# Patient Record
Sex: Female | Born: 1945 | Race: White | Hispanic: No | Smoking: Former smoker
Health system: Southern US, Community
[De-identification: ages and names within clinical notes are randomized; demographics above are authoritative.]

## PROBLEM LIST (undated history)

## (undated) ENCOUNTER — Ambulatory Visit: Admission: EM | Payer: Medicare PPO | Source: Home / Self Care

## (undated) DIAGNOSIS — I739 Peripheral vascular disease, unspecified: Secondary | ICD-10-CM

## (undated) DIAGNOSIS — I70219 Atherosclerosis of native arteries of extremities with intermittent claudication, unspecified extremity: Secondary | ICD-10-CM

## (undated) DIAGNOSIS — G4700 Insomnia, unspecified: Secondary | ICD-10-CM

## (undated) DIAGNOSIS — C4492 Squamous cell carcinoma of skin, unspecified: Secondary | ICD-10-CM

## (undated) DIAGNOSIS — L219 Seborrheic dermatitis, unspecified: Secondary | ICD-10-CM

## (undated) DIAGNOSIS — H3530 Unspecified macular degeneration: Secondary | ICD-10-CM

## (undated) DIAGNOSIS — E785 Hyperlipidemia, unspecified: Secondary | ICD-10-CM

## (undated) DIAGNOSIS — D649 Anemia, unspecified: Secondary | ICD-10-CM

## (undated) DIAGNOSIS — M169 Osteoarthritis of hip, unspecified: Secondary | ICD-10-CM

## (undated) DIAGNOSIS — G459 Transient cerebral ischemic attack, unspecified: Secondary | ICD-10-CM

## (undated) DIAGNOSIS — M5136 Other intervertebral disc degeneration, lumbar region: Secondary | ICD-10-CM

## (undated) DIAGNOSIS — I209 Angina pectoris, unspecified: Secondary | ICD-10-CM

## (undated) DIAGNOSIS — N186 End stage renal disease: Secondary | ICD-10-CM

## (undated) DIAGNOSIS — E119 Type 2 diabetes mellitus without complications: Secondary | ICD-10-CM

## (undated) DIAGNOSIS — H905 Unspecified sensorineural hearing loss: Secondary | ICD-10-CM

## (undated) DIAGNOSIS — E1122 Type 2 diabetes mellitus with diabetic chronic kidney disease: Secondary | ICD-10-CM

## (undated) DIAGNOSIS — Z5309 Procedure and treatment not carried out because of other contraindication: Secondary | ICD-10-CM

## (undated) DIAGNOSIS — E039 Hypothyroidism, unspecified: Secondary | ICD-10-CM

## (undated) DIAGNOSIS — N189 Chronic kidney disease, unspecified: Secondary | ICD-10-CM

## (undated) DIAGNOSIS — Z7982 Long term (current) use of aspirin: Secondary | ICD-10-CM

## (undated) DIAGNOSIS — D631 Anemia in chronic kidney disease: Secondary | ICD-10-CM

## (undated) DIAGNOSIS — J449 Chronic obstructive pulmonary disease, unspecified: Secondary | ICD-10-CM

## (undated) DIAGNOSIS — J849 Interstitial pulmonary disease, unspecified: Secondary | ICD-10-CM

## (undated) DIAGNOSIS — C4490 Unspecified malignant neoplasm of skin, unspecified: Secondary | ICD-10-CM

## (undated) DIAGNOSIS — K219 Gastro-esophageal reflux disease without esophagitis: Secondary | ICD-10-CM

## (undated) DIAGNOSIS — F329 Major depressive disorder, single episode, unspecified: Secondary | ICD-10-CM

## (undated) DIAGNOSIS — M5030 Other cervical disc degeneration, unspecified cervical region: Secondary | ICD-10-CM

## (undated) DIAGNOSIS — M810 Age-related osteoporosis without current pathological fracture: Secondary | ICD-10-CM

## (undated) DIAGNOSIS — D126 Benign neoplasm of colon, unspecified: Secondary | ICD-10-CM

## (undated) DIAGNOSIS — Z7960 Long term (current) use of unspecified immunomodulators and immunosuppressants: Secondary | ICD-10-CM

## (undated) DIAGNOSIS — C801 Malignant (primary) neoplasm, unspecified: Secondary | ICD-10-CM

## (undated) DIAGNOSIS — R011 Cardiac murmur, unspecified: Secondary | ICD-10-CM

## (undated) DIAGNOSIS — R001 Bradycardia, unspecified: Secondary | ICD-10-CM

## (undated) DIAGNOSIS — Z79899 Other long term (current) drug therapy: Secondary | ICD-10-CM

## (undated) DIAGNOSIS — Z992 Dependence on renal dialysis: Secondary | ICD-10-CM

## (undated) DIAGNOSIS — I6523 Occlusion and stenosis of bilateral carotid arteries: Secondary | ICD-10-CM

## (undated) DIAGNOSIS — M1990 Unspecified osteoarthritis, unspecified site: Secondary | ICD-10-CM

## (undated) DIAGNOSIS — I10 Essential (primary) hypertension: Secondary | ICD-10-CM

## (undated) DIAGNOSIS — E11319 Type 2 diabetes mellitus with unspecified diabetic retinopathy without macular edema: Secondary | ICD-10-CM

## (undated) DIAGNOSIS — I509 Heart failure, unspecified: Secondary | ICD-10-CM

## (undated) DIAGNOSIS — E559 Vitamin D deficiency, unspecified: Secondary | ICD-10-CM

## (undated) DIAGNOSIS — M51369 Other intervertebral disc degeneration, lumbar region without mention of lumbar back pain or lower extremity pain: Secondary | ICD-10-CM

## (undated) DIAGNOSIS — I2510 Atherosclerotic heart disease of native coronary artery without angina pectoris: Secondary | ICD-10-CM

## (undated) DIAGNOSIS — I700 Atherosclerosis of aorta: Secondary | ICD-10-CM

## (undated) HISTORY — PX: COLONOSCOPY: SHX174

## (undated) HISTORY — PX: TONSILLECTOMY: SUR1361

## (undated) HISTORY — DX: Type 2 diabetes mellitus without complications: E11.9

## (undated) HISTORY — PX: ABDOMINAL HYSTERECTOMY: SHX81

## (undated) HISTORY — PX: CAROTID ENDARTERECTOMY: SUR193

## (undated) HISTORY — PX: CARDIAC CATHETERIZATION: SHX172

## (undated) HISTORY — DX: Essential (primary) hypertension: I10

---
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
## 1969-12-18 HISTORY — PX: ABDOMINAL HYSTERECTOMY: SHX81

## 2000-12-18 DIAGNOSIS — Z951 Presence of aortocoronary bypass graft: Secondary | ICD-10-CM

## 2000-12-18 HISTORY — DX: Presence of aortocoronary bypass graft: Z95.1

## 2000-12-18 HISTORY — PX: CORONARY ARTERY BYPASS GRAFT: SHX141

## 2001-01-18 HISTORY — PX: CORONARY ARTERY BYPASS GRAFT: SHX141

## 2001-12-18 HISTORY — PX: CORONARY ARTERY BYPASS GRAFT: SHX141

## 2011-07-29 DIAGNOSIS — E785 Hyperlipidemia, unspecified: Secondary | ICD-10-CM | POA: Insufficient documentation

## 2011-07-29 DIAGNOSIS — E1169 Type 2 diabetes mellitus with other specified complication: Secondary | ICD-10-CM | POA: Insufficient documentation

## 2011-07-29 DIAGNOSIS — E119 Type 2 diabetes mellitus without complications: Secondary | ICD-10-CM | POA: Insufficient documentation

## 2011-07-29 DIAGNOSIS — R9439 Abnormal result of other cardiovascular function study: Secondary | ICD-10-CM | POA: Insufficient documentation

## 2011-07-29 DIAGNOSIS — E1159 Type 2 diabetes mellitus with other circulatory complications: Secondary | ICD-10-CM | POA: Insufficient documentation

## 2011-07-29 DIAGNOSIS — F439 Reaction to severe stress, unspecified: Secondary | ICD-10-CM | POA: Insufficient documentation

## 2011-07-29 DIAGNOSIS — I2510 Atherosclerotic heart disease of native coronary artery without angina pectoris: Secondary | ICD-10-CM | POA: Insufficient documentation

## 2011-07-29 DIAGNOSIS — E669 Obesity, unspecified: Secondary | ICD-10-CM | POA: Insufficient documentation

## 2011-07-29 DIAGNOSIS — E1122 Type 2 diabetes mellitus with diabetic chronic kidney disease: Secondary | ICD-10-CM | POA: Insufficient documentation

## 2011-07-29 DIAGNOSIS — N186 End stage renal disease: Secondary | ICD-10-CM | POA: Insufficient documentation

## 2012-06-24 DIAGNOSIS — D649 Anemia, unspecified: Secondary | ICD-10-CM | POA: Insufficient documentation

## 2012-09-11 DIAGNOSIS — I7782 Antineutrophilic cytoplasmic antibody (ANCA) vasculitis: Secondary | ICD-10-CM

## 2012-09-11 DIAGNOSIS — N186 End stage renal disease: Secondary | ICD-10-CM | POA: Insufficient documentation

## 2012-09-11 DIAGNOSIS — N184 Chronic kidney disease, stage 4 (severe): Secondary | ICD-10-CM | POA: Insufficient documentation

## 2012-09-11 DIAGNOSIS — N08 Glomerular disorders in diseases classified elsewhere: Secondary | ICD-10-CM

## 2012-09-11 HISTORY — DX: Glomerular disorders in diseases classified elsewhere: N08

## 2012-09-11 HISTORY — DX: Antineutrophilic cytoplasmic antibody (ANCA) vasculitis: I77.82

## 2012-11-01 DIAGNOSIS — D638 Anemia in other chronic diseases classified elsewhere: Secondary | ICD-10-CM | POA: Insufficient documentation

## 2012-12-18 HISTORY — PX: CATARACT EXTRACTION: SUR2

## 2013-03-09 DIAGNOSIS — R55 Syncope and collapse: Secondary | ICD-10-CM | POA: Insufficient documentation

## 2013-06-23 DIAGNOSIS — I6529 Occlusion and stenosis of unspecified carotid artery: Secondary | ICD-10-CM | POA: Insufficient documentation

## 2013-11-10 DIAGNOSIS — M25561 Pain in right knee: Secondary | ICD-10-CM | POA: Insufficient documentation

## 2013-11-10 DIAGNOSIS — M25522 Pain in left elbow: Secondary | ICD-10-CM | POA: Insufficient documentation

## 2013-11-10 DIAGNOSIS — W19XXXA Unspecified fall, initial encounter: Secondary | ICD-10-CM | POA: Insufficient documentation

## 2013-11-10 DIAGNOSIS — S8001XA Contusion of right knee, initial encounter: Secondary | ICD-10-CM | POA: Insufficient documentation

## 2013-11-10 DIAGNOSIS — S52122A Displaced fracture of head of left radius, initial encounter for closed fracture: Secondary | ICD-10-CM | POA: Insufficient documentation

## 2013-11-10 DIAGNOSIS — S0083XA Contusion of other part of head, initial encounter: Secondary | ICD-10-CM | POA: Insufficient documentation

## 2013-11-19 DIAGNOSIS — N19 Unspecified kidney failure: Secondary | ICD-10-CM | POA: Insufficient documentation

## 2014-01-05 DIAGNOSIS — K8020 Calculus of gallbladder without cholecystitis without obstruction: Secondary | ICD-10-CM | POA: Insufficient documentation

## 2014-07-24 DIAGNOSIS — R0609 Other forms of dyspnea: Secondary | ICD-10-CM | POA: Insufficient documentation

## 2014-07-24 DIAGNOSIS — R0600 Dyspnea, unspecified: Secondary | ICD-10-CM | POA: Insufficient documentation

## 2014-07-24 DIAGNOSIS — R42 Dizziness and giddiness: Secondary | ICD-10-CM | POA: Insufficient documentation

## 2014-08-20 DIAGNOSIS — D631 Anemia in chronic kidney disease: Secondary | ICD-10-CM | POA: Insufficient documentation

## 2015-01-18 DIAGNOSIS — G459 Transient cerebral ischemic attack, unspecified: Secondary | ICD-10-CM | POA: Insufficient documentation

## 2015-02-23 DIAGNOSIS — I208 Other forms of angina pectoris: Secondary | ICD-10-CM | POA: Insufficient documentation

## 2015-06-29 DIAGNOSIS — M791 Myalgia, unspecified site: Secondary | ICD-10-CM | POA: Insufficient documentation

## 2015-06-29 DIAGNOSIS — I70219 Atherosclerosis of native arteries of extremities with intermittent claudication, unspecified extremity: Secondary | ICD-10-CM | POA: Insufficient documentation

## 2015-06-29 DIAGNOSIS — I739 Peripheral vascular disease, unspecified: Secondary | ICD-10-CM | POA: Insufficient documentation

## 2015-08-17 DIAGNOSIS — I739 Peripheral vascular disease, unspecified: Secondary | ICD-10-CM | POA: Insufficient documentation

## 2016-06-05 DIAGNOSIS — M8589 Other specified disorders of bone density and structure, multiple sites: Secondary | ICD-10-CM | POA: Insufficient documentation

## 2017-03-28 DIAGNOSIS — J449 Chronic obstructive pulmonary disease, unspecified: Secondary | ICD-10-CM | POA: Insufficient documentation

## 2017-10-10 DIAGNOSIS — D126 Benign neoplasm of colon, unspecified: Secondary | ICD-10-CM | POA: Insufficient documentation

## 2017-12-18 DIAGNOSIS — G459 Transient cerebral ischemic attack, unspecified: Secondary | ICD-10-CM

## 2017-12-18 HISTORY — DX: Transient cerebral ischemic attack, unspecified: G45.9

## 2018-07-22 DIAGNOSIS — I358 Other nonrheumatic aortic valve disorders: Secondary | ICD-10-CM | POA: Insufficient documentation

## 2018-12-18 HISTORY — PX: CHOLECYSTECTOMY: SHX55

## 2019-11-18 DIAGNOSIS — R5383 Other fatigue: Secondary | ICD-10-CM | POA: Insufficient documentation

## 2019-11-18 DIAGNOSIS — I10 Essential (primary) hypertension: Secondary | ICD-10-CM | POA: Insufficient documentation

## 2019-11-18 DIAGNOSIS — N1832 Chronic kidney disease, stage 3b: Secondary | ICD-10-CM | POA: Insufficient documentation

## 2020-07-22 HISTORY — PX: EYE SURGERY: SHX253

## 2020-09-07 ENCOUNTER — Other Ambulatory Visit: Payer: Self-pay

## 2020-09-07 ENCOUNTER — Encounter (INDEPENDENT_AMBULATORY_CARE_PROVIDER_SITE_OTHER): Payer: Self-pay | Admitting: Ophthalmology

## 2020-09-07 ENCOUNTER — Ambulatory Visit (INDEPENDENT_AMBULATORY_CARE_PROVIDER_SITE_OTHER): Payer: Medicare PPO | Admitting: Ophthalmology

## 2020-09-07 DIAGNOSIS — E113412 Type 2 diabetes mellitus with severe nonproliferative diabetic retinopathy with macular edema, left eye: Secondary | ICD-10-CM | POA: Insufficient documentation

## 2020-09-07 DIAGNOSIS — Z9889 Other specified postprocedural states: Secondary | ICD-10-CM | POA: Insufficient documentation

## 2020-09-07 DIAGNOSIS — E113293 Type 2 diabetes mellitus with mild nonproliferative diabetic retinopathy without macular edema, bilateral: Secondary | ICD-10-CM

## 2020-09-07 DIAGNOSIS — E113393 Type 2 diabetes mellitus with moderate nonproliferative diabetic retinopathy without macular edema, bilateral: Secondary | ICD-10-CM | POA: Insufficient documentation

## 2020-09-07 DIAGNOSIS — H35352 Cystoid macular degeneration, left eye: Secondary | ICD-10-CM

## 2020-09-07 DIAGNOSIS — E113391 Type 2 diabetes mellitus with moderate nonproliferative diabetic retinopathy without macular edema, right eye: Secondary | ICD-10-CM | POA: Insufficient documentation

## 2020-09-07 DIAGNOSIS — E113213 Type 2 diabetes mellitus with mild nonproliferative diabetic retinopathy with macular edema, bilateral: Secondary | ICD-10-CM | POA: Insufficient documentation

## 2020-09-07 MED ORDER — BROMFENAC SODIUM 0.07 % OP SOLN: 1.0000 [drp] | Freq: Every morning | OPHTHALMIC | 3 refills | Status: DC

## 2020-09-07 NOTE — Assessment & Plan Note (Signed)
Is minor and may recruit represent an improved state post vitrectomy membrane peel at this may be clearing on its own.  Because of the location and the type of nonexudative edema, I will offer the patient a sample bottle PROLENSA to use once daily and thereafter 1 bottle of generic PROLENSA once daily left eye perhaps assist healing.  Condition could in fact only be early CSME of mild nonproliferative diabetic retinopathy.  Nonetheless I think the former is most likely we will we will treat briefly very safe topical medications

## 2020-09-07 NOTE — Assessment & Plan Note (Signed)
The nature of mild nonproliferative diabetic retinopathy was discussed with the patient. Emphasis was placed on tight glucose, blood pressure, and serum lipid control. Avoidance of smoking was emphasized. Maintenance of normal body weight was emphasized. Appropriate follow up dilated exam is 1 year. 

## 2020-09-07 NOTE — Progress Notes (Signed)
09/07/2020     CHIEF COMPLAINT Patient presents for Retina Follow Up   HISTORY OF PRESENT ILLNESS: Alexandria Moody is a 74 y.o. female who presents to the clinic today for:   HPI    Retina Follow Up    Diagnosis: ERM/ Post OP.  In left eye.  Severity is moderate.  Duration of 1.5 months.  Since onset it is stable.  I, the attending physician,  performed the HPI with the patient and updated documentation appropriately.          Comments    Pt referred by Dr. Towanda Octave for post op eval - Vitrectomy and Membrane Peel OS,   Pt states OS vision is still very blurry. Pt recently moved to [place], [place].  BGL: did not check A1C: 6       Last edited by Hurman Horn, MD on 09/07/2020  2:14 PM. (History)      Referring physician: No referring provider defined for this encounter.  HISTORICAL INFORMATION:   Selected notes from the MEDICAL RECORD NUMBER       CURRENT MEDICATIONS: No current outpatient medications on file. (Ophthalmic Drugs)   No current facility-administered medications for this visit. (Ophthalmic Drugs)   Current Outpatient Medications (Other)  Medication Sig  . albuterol (VENTOLIN HFA) 108 (90 Base) MCG/ACT inhaler Inhale into the lungs.  Marland Kitchen amLODipine (NORVASC) 5 MG tablet Take 1 tablet by mouth 2 (two) times daily.  . Aspirin Buf,CaCarb-MgCarb-MgO, 81 MG TABS Take 1 tablet by mouth daily.  . calcitRIOL (ROCALTROL) 0.25 MCG capsule Take by mouth.  . citalopram (CELEXA) 20 MG tablet Take by mouth.  . furosemide (LASIX) 20 MG tablet Take by mouth.  . hydrALAZINE (APRESOLINE) 50 MG tablet Take by mouth.  . insulin glargine, 1 Unit Dial, (TOUJEO SOLOSTAR) 300 UNIT/ML Solostar Pen   . isosorbide mononitrate (IMDUR) 120 MG 24 hr tablet Take by mouth.  . levothyroxine (SYNTHROID) 75 MCG tablet Take by mouth.  . linagliptin (TRADJENTA) 5 MG TABS tablet 1 (one) time each day  . omeprazole (PRILOSEC) 20 MG capsule Take 1 capsule by mouth daily.  . ranolazine (RANEXA) 500  MG 12 hr tablet Take by mouth.  . rosuvastatin (CRESTOR) 10 MG tablet Take by mouth.  Marland Kitchen acetaminophen (TYLENOL) 325 MG tablet Take by mouth.  Marland Kitchen aspirin 81 MG EC tablet Take by mouth.  . Omega-3 Fatty Acids (FISH OIL) 1000 MG CAPS Take by mouth.  . temazepam (RESTORIL) 15 MG capsule Take by mouth.  . Tiotropium Bromide-Olodaterol 2.5-2.5 MCG/ACT AERS Inhale into the lungs.   No current facility-administered medications for this visit. (Other)      REVIEW OF SYSTEMS: ROS    Positive for: Endocrine   Last edited by Tilda Franco on 09/07/2020  1:42 PM. (History)       ALLERGIES No Known Allergies  PAST MEDICAL HISTORY Past Medical History:  Diagnosis Date  . Diabetes mellitus without complication ([place])   . Hypertension    Past Surgical History:  Procedure Laterality Date  . CATARACT EXTRACTION Left 2014  . EYE SURGERY Left 07/22/2020   Dr. Towanda Octave, Vit and Mart[facility] Lamas    FAMILY HISTORY History reviewed. No pertinent family history.  SOCIAL HISTORY Social History   Tobacco Use  . Smoking status: Never Smoker  . Smokeless tobacco: Never Used  Substance Use Topics  . Alcohol use: Not on file  . Drug use: Not on file         OPHTHALMIC  EXAM: Base Eye Exam    Visual Acuity (Snellen - Linear)      Right Left   Dist cc 20/20 -2 20/25   Correction: Glasses       Tonometry (Tonopen, 1:50 PM)      Right Left   Pressure 19 18       Pupils      Pupils Dark Light Shape React APD   Right PERRL 4 3 Round Slow None   Left PERRL 4 3 Round Slow None       Visual Fields (Counting fingers)      Left Right    Full Full       Neuro/Psych    Oriented x3: Yes   Mood/Affect: Normal       Dilation    Both eyes: 1.0% Mydriacyl, 2.5% Phenylephrine @ 1:50 PM        Slit Lamp and Fundus Exam    External Exam      Right Left   External Normal Normal       Slit Lamp Exam      Right Left   Lids/Lashes Normal Normal   Conjunctiva/Sclera White and quiet  White and quiet   Cornea Clear Clear   Anterior Chamber Deep and quiet Deep and quiet   Iris Round and reactive Round and reactive   Anterior Vitreous Normal Normal       Fundus Exam      Right Left   Posterior Vitreous Posterior vitreous detachment Clear vitrectomized   Disc Normal Normal   C/D Ratio 0.25 0.3   Macula Normal Microaneurysms, no macular thickening, no exudates, no clinically significant macular edema   Vessels NPDR- Mild    Periphery Normal Normal          IMAGING AND PROCEDURES  Imaging and Procedures for 09/07/20  OCT, Retina - OU - Both Eyes       Right Eye Quality was good. Scan locations included subfoveal. Central Foveal Thickness: 241. Progression has been stable. Findings include epiretinal membrane.   Left Eye Quality was good. Scan locations included subfoveal. Central Foveal Thickness: 300. Progression has improved. Findings include cystoid macular edema.   Notes Minor epiretinal membrane nasal to the fovea right eye, no topographic distortion to the macular region.  OS with small minor perifoveal CME.  Clinical findings of a cluster of microaneurysms which could be mild to moderate nonproliferative diabetic retinopathy no overt CSME no exudates                ASSESSMENT/PLAN:  Cystoid macular edema of left eye Is minor and may recruit represent an improved state post vitrectomy membrane peel at this may be clearing on its own.  Because of the location and the type of nonexudative edema, I will offer the patient a sample bottle PROLENSA to use once daily and thereafter 1 bottle of generic PROLENSA once daily left eye perhaps assist healing.  Condition could in fact only be early CSME of mild nonproliferative diabetic retinopathy.  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 Emphasis was placed on tight glucose, blood pressure, and serum lipid control. Avoidance of smoking was emphasized. Maintenance of normal body weight was emphasized. Appropriate follow up dilated exam is 1 year.  ICD-10-CM   1. Mild nonproliferative diabetic retinopathy of both eyes without macular edema associated with type 2 diabetes mellitus (HCC)  L94.4461 OCT, Retina - OU - Both Eyes  2. Cystoid macular edema of left eye  H35.352 OCT, Retina - OU - Both Eyes  3. History of vitrectomy  Z98.890     1.  Patient to commence topical use of bromfenac left eye once daily double bottle 2.5 cc given  2.  Generic prescription given to be used once this sample bottle is completed  3.  Follow-up in 5 weeks to monitor resolution of CME OS  Ophthalmic Meds Ordered this visit:  No orders of the defined types were placed in this encounter.      Return in about 5 weeks (around 10/12/2020) for dilate, OS, OCT.  There are no Patient Instructions on file for this visit.   Explained the diagnoses, plan, and follow up with the patient and they expressed understanding.  Patient expressed understanding of the importance of proper follow up care.   Clent Demark Earnestene Angello M.D. Diseases & Surgery of the Retina and Vitreous Retina & Diabetic [place] 09/07/20     Abbreviations: M myopia (nearsighted); A astigmatism; H hyperopia (farsighted); P presbyopia; Mrx spectacle prescription;  CTL contact lenses; OD right eye; OS left eye; OU both eyes  XT exotropia; ET esotropia; PEK punctate epithelial keratitis; PEE punctate epithelial erosions; DES dry eye syndrome; MGD meibomian gland dysfunction; ATs artificial tears; PFAT's preservative free artificial tears; [place] nuclear sclerotic cataract; PSC posterior subcapsular cataract; ERM epi-retinal membrane; PVD posterior vitreous detachment; RD  retinal detachment; DM diabetes mellitus; DR diabetic retinopathy; NPDR non-proliferative diabetic retinopathy; PDR proliferative diabetic retinopathy; CSME clinically significant macular edema; DME diabetic macular edema; dbh dot blot hemorrhages; CWS cotton wool spot; POAG primary open angle glaucoma; C/D cup-to-disc ratio; HVF humphrey visual field; GVF goldmann visual field; OCT optical coherence tomography; IOP intraocular pressure; BRVO Branch retinal vein occlusion; CRVO central retinal vein occlusion; CRAO central retinal artery occlusion; BRAO branch retinal artery occlusion; RT retinal tear; SB scleral buckle; PPV pars plana vitrectomy; VH Vitreous hemorrhage; PRP panretinal laser photocoagulation; IVK intravitreal kenalog; VMT vitreomacular traction; MH Macular hole;  NVD neovascularization of the disc; NVE neovascularization elsewhere; AREDS age related eye disease study; ARMD age related macular degeneration; POAG primary open angle glaucoma; EBMD epithelial/anterior basement membrane dystrophy; ACIOL anterior chamber intraocular lens; IOL intraocular lens; PCIOL posterior chamber intraocular lens; Phaco/IOL phacoemulsification with intraocular lens placement; [place] photorefractive keratectomy; LASIK laser assisted in situ keratomileusis; HTN hypertension; DM diabetes mellitus; COPD chronic obstructive pulmonary disease

## 2020-09-10 DIAGNOSIS — I700 Atherosclerosis of aorta: Secondary | ICD-10-CM | POA: Insufficient documentation

## 2020-09-10 DIAGNOSIS — J479 Bronchiectasis, uncomplicated: Secondary | ICD-10-CM | POA: Insufficient documentation

## 2020-09-10 DIAGNOSIS — Z79899 Other long term (current) drug therapy: Secondary | ICD-10-CM | POA: Insufficient documentation

## 2020-10-12 ENCOUNTER — Ambulatory Visit (INDEPENDENT_AMBULATORY_CARE_PROVIDER_SITE_OTHER): Payer: Medicare PPO | Admitting: Ophthalmology

## 2020-10-12 ENCOUNTER — Other Ambulatory Visit: Payer: Self-pay

## 2020-10-12 ENCOUNTER — Encounter (INDEPENDENT_AMBULATORY_CARE_PROVIDER_SITE_OTHER): Payer: Self-pay | Admitting: Ophthalmology

## 2020-10-12 DIAGNOSIS — E113293 Type 2 diabetes mellitus with mild nonproliferative diabetic retinopathy without macular edema, bilateral: Secondary | ICD-10-CM | POA: Diagnosis not present

## 2020-10-12 DIAGNOSIS — H35371 Puckering of macula, right eye: Secondary | ICD-10-CM | POA: Insufficient documentation

## 2020-10-12 DIAGNOSIS — H35352 Cystoid macular degeneration, left eye: Secondary | ICD-10-CM

## 2020-10-12 NOTE — Assessment & Plan Note (Signed)
Improved on topical NSAIDs mostly ketorolac 4 times daily, will taper to twice daily and then have her discontinue this in 2 months.  If however the visual acuity in this left eye blurs again, she may need to restart the medication.  Should she need to restart the medication she should contact the office so that refills would be approved

## 2020-10-12 NOTE — Assessment & Plan Note (Signed)
No progression of peripheral retinopathy

## 2020-10-12 NOTE — Progress Notes (Signed)
10/12/2020     CHIEF COMPLAINT Patient presents for Retina Follow Up   HISTORY OF PRESENT ILLNESS: Alexandria Moody is a 74 y.o. female who presents to the clinic today for:   HPI    Retina Follow Up    Diagnosis: CME.  In left eye.  Severity is moderate.  Duration of 5 weeks.  Since onset it is stable.  I, the attending physician,  performed the HPI with the patient and updated documentation appropriately.          Comments    5 Week CME f\u OS. OCT  Pt states OS vision is still not doing well. Pt states vision is the same as last visit. Using gtts as directed. BGL did not check       Last edited by Tilda Franco on 10/12/2020  1:49 PM. (History)      Referring physician: No referring provider defined for this encounter.  HISTORICAL INFORMATION:   Selected notes from the MEDICAL RECORD NUMBER       CURRENT MEDICATIONS: Current Outpatient Medications (Ophthalmic Drugs)  Medication Sig  . Bromfenac Sodium 0.07 % SOLN Place 1 drop into the left eye every morning.   No current facility-administered medications for this visit. (Ophthalmic Drugs)   Current Outpatient Medications (Other)  Medication Sig  . acetaminophen (TYLENOL) 325 MG tablet Take by mouth.  Marland Kitchen albuterol (VENTOLIN HFA) 108 (90 Base) MCG/ACT inhaler Inhale into the lungs.  Marland Kitchen amLODipine (NORVASC) 5 MG tablet Take 1 tablet by mouth 2 (two) times daily.  Marland Kitchen aspirin 81 MG EC tablet Take by mouth.  . Aspirin Buf,CaCarb-MgCarb-MgO, 81 MG TABS Take 1 tablet by mouth daily.  . calcitRIOL (ROCALTROL) 0.25 MCG capsule Take by mouth.  . citalopram (CELEXA) 20 MG tablet Take by mouth.  . furosemide (LASIX) 20 MG tablet Take by mouth.  . hydrALAZINE (APRESOLINE) 50 MG tablet Take by mouth.  . insulin glargine, 1 Unit Dial, (TOUJEO SOLOSTAR) 300 UNIT/ML Solostar Pen   . isosorbide mononitrate (IMDUR) 120 MG 24 hr tablet Take by mouth.  . levothyroxine (SYNTHROID) 75 MCG tablet Take by mouth.  . linagliptin  (TRADJENTA) 5 MG TABS tablet 1 (one) time each day  . Omega-3 Fatty Acids (FISH OIL) 1000 MG CAPS Take by mouth.  Marland Kitchen omeprazole (PRILOSEC) 20 MG capsule Take 1 capsule by mouth daily.  . ranolazine (RANEXA) 500 MG 12 hr tablet Take by mouth.  . rosuvastatin (CRESTOR) 10 MG tablet Take by mouth.  . temazepam (RESTORIL) 15 MG capsule Take by mouth.  . Tiotropium Bromide-Olodaterol 2.5-2.5 MCG/ACT AERS Inhale into the lungs.   No current facility-administered medications for this visit. (Other)      REVIEW OF SYSTEMS: ROS    Positive for: Endocrine   Last edited by Tilda Franco on 10/12/2020  1:49 PM. (History)       ALLERGIES No Known Allergies  PAST MEDICAL HISTORY Past Medical History:  Diagnosis Date  . Diabetes mellitus without complication ([place])   . Hypertension    Past Surgical History:  Procedure Laterality Date  . CATARACT EXTRACTION Left 2014  . EYE SURGERY Left 07/22/2020   Dr. Towanda Octave, Vit and Marta Lamas    FAMILY HISTORY History reviewed. No pertinent family history.  SOCIAL HISTORY Social History   Tobacco Use  . Smoking status: Never Smoker  . Smokeless tobacco: Never Used  Substance Use Topics  . Alcohol use: Not on file  . Drug use: Not on file  OPHTHALMIC EXAM:  Base Eye Exam    Visual Acuity (Snellen - Linear)      Right Left   Dist cc 20/25 20/25   Correction: Glasses       Tonometry (Tonopen, 1:52 PM)      Right Left   Pressure 21 19       Pupils      Pupils Dark Light Shape React APD   Right PERRL 4 3 Round Slow None   Left PERRL 4 3 Round Slow None       Neuro/Psych    Oriented x3: Yes   Mood/Affect: Normal       Dilation    Left eye: 1.0% Mydriacyl, 2.5% Phenylephrine @ 1:52 PM        Slit Lamp and Fundus Exam    External Exam      Right Left   External Normal Normal       Slit Lamp Exam      Right Left   Lids/Lashes Normal Normal   Conjunctiva/Sclera White and quiet White and quiet   Cornea  Clear Clear   Anterior Chamber Deep and quiet Deep and quiet   Iris Round and reactive Round and reactive   Lens Centered posterior chamber intraocular lens Centered posterior chamber intraocular lens   Anterior Vitreous Normal Normal       Fundus Exam      Right Left   Posterior Vitreous  Clear vitrectomized   Disc  Normal   C/D Ratio  0.3   Macula  Microaneurysms, no macular thickening, no exudates, no clinically significant macular edema   Vessels  NPDR- Mild   Periphery  Normal          IMAGING AND PROCEDURES  Imaging and Procedures for 10/12/20  OCT, Retina - OU - Both Eyes       Right Eye Central Foveal Thickness: 239. Progression has been stable. Findings include epiretinal membrane, abnormal foveal contour.   Left Eye Quality was good. Scan locations included subfoveal. Central Foveal Thickness: 279. Progression has improved. Findings include abnormal foveal contour.   Notes OD with minor epiretinal membrane nasal to the fovea, no impact on visual functioning  OS with much less cystoid macular edema in the perifoveal location after commencement of use of topical nonsteroidals.  We will asked the patient to taper Ketorolac to twice daily                ASSESSMENT/PLAN:  Cystoid macular edema of left eye Improved on topical NSAIDs mostly ketorolac 4 times daily, will taper to twice daily and then have her discontinue this in 2 months.  If however the visual acuity in this left eye blurs again, she may need to restart the medication.  Should she need to restart the medication she should contact the office so that refills would be approved  Mild nonproliferative diabetic retinopathy of both eyes without macular edema associated with type 2 diabetes mellitus (HCC) No progression of peripheral retinopathy  Macular pucker, right eye Minor nonfoveal will observe      ICD-10-CM   1. Cystoid macular edema of left eye  H35.352 OCT, Retina - OU - Both Eyes  2.  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 Macular pucker, right eye  H35.371     1.  I will offer the patient to continue ketorolac topically left eye only twice daily for maintenance.  2. Return follow-up visit next to monitor diabetic retinopathy and its potential progression 3.  Ophthalmic Meds Ordered this visit:  No orders of the defined types were placed in this encounter.      Return in about 6 months (around 04/12/2021) for DILATE OU, OCT.  There are no Patient Instructions on file for this visit.   Explained the diagnoses, plan, and follow up with the patient and they expressed understanding.  Patient expressed understanding of the importance of proper follow up care.   Clent Demark Tamari Busic M.D. Diseases & Surgery of the Retina and Vitreous Retina & Diabetic [place] 10/12/20     Abbreviations: M myopia (nearsighted); A astigmatism; H hyperopia (farsighted); P presbyopia; Mrx spectacle prescription;  CTL contact lenses; OD right eye; OS left eye; OU both eyes  XT exotropia; ET esotropia; PEK punctate epithelial keratitis; PEE punctate epithelial erosions; DES dry eye syndrome; MGD meibomian gland dysfunction; ATs artificial tears; PFAT's preservative free artificial tears; [place] nuclear sclerotic cataract; PSC posterior subcapsular cataract; ERM epi-retinal membrane; PVD posterior vitreous detachment; RD retinal detachment; DM diabetes mellitus; DR diabetic retinopathy; NPDR non-proliferative diabetic retinopathy; PDR proliferative diabetic retinopathy; CSME clinically significant macular edema; DME diabetic macular edema; dbh dot blot hemorrhages; CWS cotton wool spot; POAG primary open angle glaucoma; C/D cup-to-disc ratio; HVF humphrey visual field; GVF goldmann visual field; OCT optical coherence tomography; IOP intraocular pressure; BRVO Branch retinal vein occlusion; CRVO central  retinal vein occlusion; CRAO central retinal artery occlusion; BRAO branch retinal artery occlusion; RT retinal tear; SB scleral buckle; PPV pars plana vitrectomy; VH Vitreous hemorrhage; PRP panretinal laser photocoagulation; IVK intravitreal kenalog; VMT vitreomacular traction; MH Macular hole;  NVD neovascularization of the disc; NVE neovascularization elsewhere; AREDS age related eye disease study; ARMD age related macular degeneration; POAG primary open angle glaucoma; EBMD epithelial/anterior basement membrane dystrophy; ACIOL anterior chamber intraocular lens; IOL intraocular lens; PCIOL posterior chamber intraocular lens; Phaco/IOL phacoemulsification with intraocular lens placement; [place] photorefractive keratectomy; LASIK laser assisted in situ keratomileusis; HTN hypertension; DM diabetes mellitus; COPD chronic obstructive pulmonary disease

## 2020-10-12 NOTE — Assessment & Plan Note (Signed)
Minor nonfoveal will observe

## 2020-11-16 ENCOUNTER — Other Ambulatory Visit: Payer: Self-pay

## 2020-11-16 ENCOUNTER — Ambulatory Visit (INDEPENDENT_AMBULATORY_CARE_PROVIDER_SITE_OTHER): Payer: Medicare PPO | Admitting: Ophthalmology

## 2020-11-16 ENCOUNTER — Encounter (INDEPENDENT_AMBULATORY_CARE_PROVIDER_SITE_OTHER): Payer: Self-pay | Admitting: Ophthalmology

## 2020-11-16 DIAGNOSIS — E113293 Type 2 diabetes mellitus with mild nonproliferative diabetic retinopathy without macular edema, bilateral: Secondary | ICD-10-CM | POA: Diagnosis not present

## 2020-11-16 DIAGNOSIS — H35371 Puckering of macula, right eye: Secondary | ICD-10-CM | POA: Diagnosis not present

## 2020-11-16 DIAGNOSIS — H35351 Cystoid macular degeneration, right eye: Secondary | ICD-10-CM | POA: Insufficient documentation

## 2020-11-16 NOTE — Assessment & Plan Note (Signed)
New and secondary to epiretinal membrane as mentioned elsewhere

## 2020-11-16 NOTE — Progress Notes (Signed)
11/16/2020     CHIEF COMPLAINT Patient presents for Retina Follow Up   HISTORY OF PRESENT ILLNESS: Alexandria Moody is a 74 y.o. female who presents to the clinic today for:   HPI    Retina Follow Up    Patient presents with  Diabetic Retinopathy.  In right eye.  This started 3 weeks ago.  Severity is mild.  Duration of 3 weeks.  Since onset it is stable.          Comments    Diabetic F/U OU - new symptoms OD  Pt c/o new blur OD x 3 weeks. Pt denies floaters or flashes of light OU. Pt sts parts of words are "blacked out" OD x 3 weeks. VA OS stable. LBS: does not recall       Last edited by Rockie Neighbours, [place] on 11/16/2020  2:39 PM. (History)      Referring physician: No referring provider defined for this encounter.  HISTORICAL INFORMATION:   Selected notes from the MEDICAL RECORD NUMBER       CURRENT MEDICATIONS: Current Outpatient Medications (Ophthalmic Drugs)  Medication Sig  . Bromfenac Sodium 0.07 % SOLN Place 1 drop into the left eye every morning.   No current facility-administered medications for this visit. (Ophthalmic Drugs)   Current Outpatient Medications (Other)  Medication Sig  . acetaminophen (TYLENOL) 325 MG tablet Take by mouth.  Marland Kitchen albuterol (VENTOLIN HFA) 108 (90 Base) MCG/ACT inhaler Inhale into the lungs.  Marland Kitchen amLODipine (NORVASC) 5 MG tablet Take 1 tablet by mouth 2 (two) times daily.  Marland Kitchen aspirin 81 MG EC tablet Take by mouth.  . Aspirin Buf,CaCarb-MgCarb-MgO, 81 MG TABS Take 1 tablet by mouth daily.  . calcitRIOL (ROCALTROL) 0.25 MCG capsule Take by mouth.  . citalopram (CELEXA) 20 MG tablet Take by mouth.  . furosemide (LASIX) 20 MG tablet Take by mouth.  . hydrALAZINE (APRESOLINE) 50 MG tablet Take by mouth.  . insulin glargine, 1 Unit Dial, (TOUJEO SOLOSTAR) 300 UNIT/ML Solostar Pen   . isosorbide mononitrate (IMDUR) 120 MG 24 hr tablet Take by mouth.  . levothyroxine (SYNTHROID) 75 MCG tablet Take by mouth.  . linagliptin (TRADJENTA) 5  MG TABS tablet 1 (one) time each day  . Omega-3 Fatty Acids (FISH OIL) 1000 MG CAPS Take by mouth.  Marland Kitchen omeprazole (PRILOSEC) 20 MG capsule Take 1 capsule by mouth daily.  . ranolazine (RANEXA) 500 MG 12 hr tablet Take by mouth.  . rosuvastatin (CRESTOR) 10 MG tablet Take by mouth.  . temazepam (RESTORIL) 15 MG capsule Take by mouth.  . Tiotropium Bromide-Olodaterol 2.5-2.5 MCG/ACT AERS Inhale into the lungs.   No current facility-administered medications for this visit. (Other)      REVIEW OF SYSTEMS:    ALLERGIES No Known Allergies  PAST MEDICAL HISTORY Past Medical History:  Diagnosis Date  . Diabetes mellitus without complication ([place])   . Hypertension    Past Surgical History:  Procedure Laterality Date  . CATARACT EXTRACTION Left 2014  . EYE SURGERY Left 07/22/2020   Dr. Towanda Octave, Vit and Marta Lamas    FAMILY HISTORY History reviewed. No pertinent family history.  SOCIAL HISTORY Social History   Tobacco Use  . Smoking status: Never Smoker  . Smokeless tobacco: Never Used  Substance Use Topics  . Alcohol use: Not on file  . Drug use: Not on file         OPHTHALMIC EXAM: Base Eye Exam    Visual Acuity (ETDRS)  Right Left   Dist cc 20/100 20/25 -1   Dist ph cc NI    Correction: Glasses       Tonometry (Tonopen, 2:36 PM)      Right Left   Pressure 18 17       Pupils      Pupils Dark Light Shape React APD   Right PERRL 5 4 Round Slow None   Left PERRL 5 4 Round Slow None       Visual Fields (Counting fingers)      Left Right    Full Full       Extraocular Movement      Right Left    Full Full       Neuro/Psych    Oriented x3: Yes   Mood/Affect: Normal       Dilation    Both eyes: 1.0% Mydriacyl, 2.5% Phenylephrine @ 2:40 PM        Slit Lamp and Fundus Exam    External Exam      Right Left   External Normal Normal       Slit Lamp Exam      Right Left   Lids/Lashes Normal Normal   Conjunctiva/Sclera White and quiet  White and quiet   Cornea Clear Clear   Anterior Chamber Deep and quiet Deep and quiet   Iris Round and reactive Round and reactive   Lens Centered posterior chamber intraocular lens Centered posterior chamber intraocular lens   Anterior Vitreous Normal Normal       Fundus Exam      Right Left   Posterior Vitreous Posterior vitreous detachment Clear vitrectomized   Disc Normal Normal   C/D Ratio 0.25 0.3   Macula Epiretinal membrane, with cystoid change, new at and inferior to the fovea Microaneurysms, no macular thickening, no exudates, no clinically significant macular edema   Vessels NPDR- Mild NPDR- Mild   Periphery Normal Normal          IMAGING AND PROCEDURES  Imaging and Procedures for 11/16/20  OCT, Retina - OU - Both Eyes       Right Eye Quality was good. Central Foveal Thickness: 339. Progression has worsened. Findings include epiretinal membrane, cystoid macular edema.   Left Eye Quality was good. Scan locations included subfoveal. Central Foveal Thickness: 282. Progression has been stable. Findings include central retinal atrophy, outer retinal atrophy.   Notes OS, stable findings, status post vitrectomy membrane peel years ago for epiretinal membrane  OD, with very rapid onset epiretinal membrane with secondary cystoid macular edemaAnd retinal thickening along the inferior aspect of the macula as compared to last month at [facility] as well as 2 months previous where there was no sign of an epiretinal membrane in either case                ASSESSMENT/PLAN:  Macular pucker, right eye Rapid progression now with visual acuity impact in particular with near vision, reading, will need to recommend vitrectomy and membrane peel in order to halt progression and to reverse visual acuity loss right eye  Cystoid macular edema of right eye New and secondary to epiretinal membrane as mentioned elsewhere      ICD-10-CM   1. Mild nonproliferative diabetic retinopathy  of both eyes without macular edema associated with type 2 diabetes mellitus ([place])  L27.5170 OCT, Retina - OU - Both Eyes  2. Macular pucker, right eye  H35.371   3. Cystoid macular edema of right eye  H35.351  1.  Risk and benefits of surgical intervention for epiretinal membrane with rapid progression and now visual acuity decline reviewed with the patient.  2.  Explained the patient that this would be done under local anesthesia, with minor anesthesia control where she would be comfortably awake yet no pain at all  3.  Will use paper tape to secure her forehead so she will not move because of some sensitivity to tape Ophthalmic Meds Ordered this visit:  No orders of the defined types were placed in this encounter.      Return [facility], for Schedule vitrectomy, membrane peel, [id number], OD.  There are no Patient Instructions on file for this visit.   Explained the diagnoses, plan, and follow up with the patient and they expressed understanding.  Patient expressed understanding of the importance of proper follow up care.   Clent Demark Iisha Soyars M.D. Diseases & Surgery of the Retina and Vitreous Retina & Diabetic [place] 11/16/20     Abbreviations: M myopia (nearsighted); A astigmatism; H hyperopia (farsighted); P presbyopia; Mrx spectacle prescription;  CTL contact lenses; OD right eye; OS left eye; OU both eyes  XT exotropia; ET esotropia; PEK punctate epithelial keratitis; PEE punctate epithelial erosions; DES dry eye syndrome; MGD meibomian gland dysfunction; ATs artificial tears; PFAT's preservative free artificial tears; [place] nuclear sclerotic cataract; PSC posterior subcapsular cataract; ERM epi-retinal membrane; PVD posterior vitreous detachment; RD retinal detachment; DM diabetes mellitus; DR diabetic retinopathy; NPDR non-proliferative diabetic retinopathy; PDR proliferative diabetic retinopathy; CSME clinically significant macular edema; DME diabetic macular  edema; dbh dot blot hemorrhages; CWS cotton wool spot; POAG primary open angle glaucoma; C/D cup-to-disc ratio; HVF humphrey visual field; GVF goldmann visual field; OCT optical coherence tomography; IOP intraocular pressure; BRVO Branch retinal vein occlusion; CRVO central retinal vein occlusion; CRAO central retinal artery occlusion; BRAO branch retinal artery occlusion; RT retinal tear; SB scleral buckle; PPV pars plana vitrectomy; VH Vitreous hemorrhage; PRP panretinal laser photocoagulation; IVK intravitreal kenalog; VMT vitreomacular traction; MH Macular hole;  NVD neovascularization of the disc; NVE neovascularization elsewhere; AREDS age related eye disease study; ARMD age related macular degeneration; POAG primary open angle glaucoma; EBMD epithelial/anterior basement membrane dystrophy; ACIOL anterior chamber intraocular lens; IOL intraocular lens; PCIOL posterior chamber intraocular lens; Phaco/IOL phacoemulsification with intraocular lens placement; [place] photorefractive keratectomy; LASIK laser assisted in situ keratomileusis; HTN hypertension; DM diabetes mellitus; COPD chronic obstructive pulmonary disease

## 2020-11-16 NOTE — Assessment & Plan Note (Signed)
Rapid progression now with visual acuity impact in particular with near vision, reading, will need to recommend vitrectomy and membrane peel in order to halt progression and to reverse visual acuity loss right eye

## 2020-11-24 ENCOUNTER — Ambulatory Visit (INDEPENDENT_AMBULATORY_CARE_PROVIDER_SITE_OTHER): Payer: Medicare PPO

## 2020-11-24 ENCOUNTER — Encounter (INDEPENDENT_AMBULATORY_CARE_PROVIDER_SITE_OTHER): Payer: Self-pay

## 2020-11-24 ENCOUNTER — Other Ambulatory Visit: Payer: Self-pay

## 2020-11-24 DIAGNOSIS — H35371 Puckering of macula, right eye: Secondary | ICD-10-CM

## 2020-11-24 MED ORDER — OFLOXACIN 0.3 % OP SOLN: 1.0000 [drp] | Freq: Four times a day (QID) | OPHTHALMIC | 0 refills | Status: DC

## 2020-11-24 MED ORDER — PREDNISOLONE ACETATE 1 % OP SUSP: 1.0000 [drp] | Freq: Four times a day (QID) | OPHTHALMIC | 0 refills | Status: DC

## 2020-11-24 NOTE — Progress Notes (Signed)
11/24/2020     CHIEF COMPLAINT Patient presents for No chief complaint on file.   HISTORY OF PRESENT ILLNESS: Alexandria Moody is a 74 y.o. female who presents to the clinic today for:   HPI    Pre Op Vitrectomy and Membrane Peel OD   Last edited by Tilda Franco on 11/24/2020 11:15 AM. (History)        HISTORICAL INFORMATION:   Selected notes from the MEDICAL RECORD NUMBER       CURRENT MEDICATIONS: Current Outpatient Medications (Ophthalmic Drugs)  Medication Sig  . Bromfenac Sodium 0.07 % SOLN Place 1 drop into the left eye every morning.  Derrill Memo ON 12/06/2020] ofloxacin (OCUFLOX) 0.3 % ophthalmic solution Place 1 drop into the right eye 4 (four) times daily.  Derrill Memo ON 12/06/2020] prednisoLONE acetate (PRED FORTE) 1 % ophthalmic suspension Place 1 drop into the right eye 4 (four) times daily.   No current facility-administered medications for this visit. (Ophthalmic Drugs)   Current Outpatient Medications (Other)  Medication Sig  . acetaminophen (TYLENOL) 325 MG tablet Take by mouth.  Marland Kitchen albuterol (VENTOLIN HFA) 108 (90 Base) MCG/ACT inhaler Inhale into the lungs.  Marland Kitchen amLODipine (NORVASC) 5 MG tablet Take 1 tablet by mouth 2 (two) times daily.  Marland Kitchen aspirin 81 MG EC tablet Take by mouth.  . Aspirin Buf,CaCarb-MgCarb-MgO, 81 MG TABS Take 1 tablet by mouth daily.  . calcitRIOL (ROCALTROL) 0.25 MCG capsule Take by mouth.  . citalopram (CELEXA) 20 MG tablet Take by mouth.  . furosemide (LASIX) 20 MG tablet Take by mouth.  . hydrALAZINE (APRESOLINE) 50 MG tablet Take by mouth.  . insulin glargine, 1 Unit Dial, (TOUJEO SOLOSTAR) 300 UNIT/ML Solostar Pen   . isosorbide mononitrate (IMDUR) 120 MG 24 hr tablet Take by mouth.  . levothyroxine (SYNTHROID) 75 MCG tablet Take by mouth.  . linagliptin (TRADJENTA) 5 MG TABS tablet 1 (one) time each day  . Omega-3 Fatty Acids (FISH OIL) 1000 MG CAPS Take by mouth.  Marland Kitchen omeprazole (PRILOSEC) 20 MG capsule Take 1 capsule by mouth  daily.  . ranolazine (RANEXA) 500 MG 12 hr tablet Take by mouth.  . rosuvastatin (CRESTOR) 10 MG tablet Take by mouth.  . temazepam (RESTORIL) 15 MG capsule Take by mouth.  . Tiotropium Bromide-Olodaterol 2.5-2.5 MCG/ACT AERS Inhale into the lungs.   No current facility-administered medications for this visit. (Other)     ALLERGIES No Known Allergies  PAST MEDICAL HISTORY Past Medical History:  Diagnosis Date  . Diabetes mellitus without complication ([place])   . Hypertension    Past Surgical History:  Procedure Laterality Date  . CATARACT EXTRACTION Left 2014  . EYE SURGERY Left 07/22/2020   Dr. Towanda Octave, Vit and Marta Lamas    FAMILY HISTORY History reviewed. No pertinent family history.  SOCIAL HISTORY Social History   Tobacco Use  . Smoking status: Never Smoker  . Smokeless tobacco: Never Used  Substance Use Topics  . Alcohol use: Not on file  . Drug use: Not on file         OPHTHALMIC EXAM: Base Eye Exam    Visual Acuity (Snellen - Linear)      Right Left   Dist cc 20/100 20/20 -2   Dist ph cc 20/100 +    Correction: Glasses       Tonometry (Tonopen, 11:23 AM)      Right Left   Pressure 13 14       Neuro/Psych  Oriented x3: Yes   Mood/Affect: Normal          IMAGING AND PROCEDURES  Imaging and Procedures for @TODAY @           ASSESSMENT/PLAN:    ICD-10-CM   1. Macular pucker, right eye  H35.371     Ophthalmic Meds Ordered this visit:  Meds ordered this encounter  Medications  . prednisoLONE acetate (PRED FORTE) 1 % ophthalmic suspension    Sig: Place 1 drop into the right eye 4 (four) times daily.    Dispense:  10 mL    Refill:  0  . ofloxacin (OCUFLOX) 0.3 % ophthalmic solution    Sig: Place 1 drop into the right eye 4 (four) times daily.    Dispense:  5 mL    Refill:  0        Pre-op completed. Operative consent obtained with pre-op eye drops reviewed with [name] and sent via [facility] as needed. Post op instructions  reviewed with patient and per patient all questions answered.  Tilda Franco

## 2020-12-08 ENCOUNTER — Encounter (AMBULATORY_SURGERY_CENTER): Payer: Medicare PPO | Admitting: Ophthalmology

## 2020-12-08 DIAGNOSIS — H35371 Puckering of macula, right eye: Secondary | ICD-10-CM | POA: Diagnosis not present

## 2020-12-09 ENCOUNTER — Encounter (INDEPENDENT_AMBULATORY_CARE_PROVIDER_SITE_OTHER): Payer: Self-pay | Admitting: Ophthalmology

## 2020-12-09 ENCOUNTER — Other Ambulatory Visit: Payer: Self-pay

## 2020-12-09 ENCOUNTER — Ambulatory Visit (INDEPENDENT_AMBULATORY_CARE_PROVIDER_SITE_OTHER): Payer: Medicare PPO | Admitting: Ophthalmology

## 2020-12-09 DIAGNOSIS — H35371 Puckering of macula, right eye: Secondary | ICD-10-CM

## 2020-12-09 DIAGNOSIS — Z09 Encounter for follow-up examination after completed treatment for conditions other than malignant neoplasm: Secondary | ICD-10-CM

## 2020-12-09 NOTE — Progress Notes (Signed)
12/09/2020     CHIEF COMPLAINT Patient presents for Post-op Follow-up (1 Day POV OD status post vitrectomy, membrane peel for epiretinal membrane right eye//Pt denies nausea or vomiting. Pt denies ocular pain. Pt sts, "it went great.")   HISTORY OF PRESENT ILLNESS: Alexandria Moody is a 74 y.o. female who presents to the clinic today for:   HPI    Post-op Follow-up    In right eye. Additional comments: 1 Day POV OD status post vitrectomy, membrane peel for epiretinal membrane right eye  Pt denies nausea or vomiting. Pt denies ocular pain. Pt sts, "it went great."       Last edited by Hurman Horn, MD on 12/09/2020  8:39 AM. (History)      Referring physician: No referring provider defined for this encounter.  HISTORICAL INFORMATION:   Selected notes from the MEDICAL RECORD NUMBER       CURRENT MEDICATIONS: Current Outpatient Medications (Ophthalmic Drugs)  Medication Sig  . Bromfenac Sodium 0.07 % SOLN Place 1 drop into the left eye every morning.  Marland Kitchen ofloxacin (OCUFLOX) 0.3 % ophthalmic solution Place 1 drop into the right eye 4 (four) times daily.  . prednisoLONE acetate (PRED FORTE) 1 % ophthalmic suspension Place 1 drop into the right eye 4 (four) times daily.   No current facility-administered medications for this visit. (Ophthalmic Drugs)   Current Outpatient Medications (Other)  Medication Sig  . acetaminophen (TYLENOL) 325 MG tablet Take by mouth.  Marland Kitchen albuterol (VENTOLIN HFA) 108 (90 Base) MCG/ACT inhaler Inhale into the lungs.  Marland Kitchen amLODipine (NORVASC) 5 MG tablet Take 1 tablet by mouth 2 (two) times daily.  Marland Kitchen aspirin 81 MG EC tablet Take by mouth.  . Aspirin Buf,CaCarb-MgCarb-MgO, 81 MG TABS Take 1 tablet by mouth daily.  . calcitRIOL (ROCALTROL) 0.25 MCG capsule Take by mouth.  . citalopram (CELEXA) 20 MG tablet Take by mouth.  . furosemide (LASIX) 20 MG tablet Take by mouth.  . hydrALAZINE (APRESOLINE) 50 MG tablet Take by mouth.  . insulin glargine, 1 Unit  Dial, (TOUJEO SOLOSTAR) 300 UNIT/ML Solostar Pen   . isosorbide mononitrate (IMDUR) 120 MG 24 hr tablet Take by mouth.  . levothyroxine (SYNTHROID) 75 MCG tablet Take by mouth.  . linagliptin (TRADJENTA) 5 MG TABS tablet 1 (one) time each day  . Omega-3 Fatty Acids (FISH OIL) 1000 MG CAPS Take by mouth.  Marland Kitchen omeprazole (PRILOSEC) 20 MG capsule Take 1 capsule by mouth daily.  . ranolazine (RANEXA) 500 MG 12 hr tablet Take by mouth.  . rosuvastatin (CRESTOR) 10 MG tablet Take by mouth.  . temazepam (RESTORIL) 15 MG capsule Take by mouth.  . Tiotropium Bromide-Olodaterol 2.5-2.5 MCG/ACT AERS Inhale into the lungs.   No current facility-administered medications for this visit. (Other)      REVIEW OF SYSTEMS:    ALLERGIES No Known Allergies  PAST MEDICAL HISTORY Past Medical History:  Diagnosis Date  . Diabetes mellitus without complication ([place])   . Hypertension    Past Surgical History:  Procedure Laterality Date  . CATARACT EXTRACTION Left 2014  . EYE SURGERY Left 07/22/2020   Dr. Towanda Octave, Vit and Marta Lamas    FAMILY HISTORY History reviewed. No pertinent family history.  SOCIAL HISTORY Social History   Tobacco Use  . Smoking status: Never Smoker  . Smokeless tobacco: Never Used         OPHTHALMIC EXAM:  Base Eye Exam    Visual Acuity (ETDRS)      Right  Left   Dist [place] 20/80 +1    Dist ph [place] NI        Tonometry (Tonopen, 8:03 AM)      Right Left   Pressure 16        Neuro/Psych    Oriented x3: Yes   Mood/Affect: Normal       Dilation    Right eye: 1.0% Mydriacyl, 2.5% Phenylephrine @ 8:04 AM        Slit Lamp and Fundus Exam    External Exam      Right Left   External Normal Normal       Slit Lamp Exam      Right Left   Lids/Lashes Normal Normal   Conjunctiva/Sclera White and quiet White and quiet   Cornea Clear Clear   Anterior Chamber Deep and quiet Deep and quiet   Iris Round and reactive Round and reactive   Lens Centered posterior  chamber intraocular lens Centered posterior chamber intraocular lens   Anterior Vitreous Normal Normal       Fundus Exam      Right Left   Posterior Vitreous Clear, post vitrectomy    Disc Normal    C/D Ratio 0.25    Macula Small intraretinal hemorrhage, found at time of surgical intervention, likely diabetic related.  Premembrane removal, less topographic distortion now    Vessels NPDR- Mild    Periphery Normal           IMAGING AND PROCEDURES  Imaging and Procedures for 12/09/20           ASSESSMENT/PLAN:  Postoperative follow-up No lifting and bending for 1 week. No water in the eye for 10 days. Do not rub the eye. Wear shield at night for 1-3 days.  Wear your CPAP as normal, if instructed by your doctor.  Continue your topical medications for a total of 3 weeks.  Do not refill your postoperative medications unless instructed.      ICD-10-CM   1. Postoperative follow-up  Z09   2. Macular pucker, right eye  H35.371     1.  Looks great OD 1 day postop, will monitor and follow-up and return visit in 1 week  2.  3.  Ophthalmic Meds Ordered this visit:  No orders of the defined types were placed in this encounter.      Return in about 1 week (around 12/16/2020) for dilate, OD, OCT, POST OP.  Patient Instructions  Patient instructed to restart topical eyedrops right eye  Prednisolone acetate 1 drop right eye 4 times daily  Ofloxacin 1 drop right eye 4 times daily  Patient instructed not to compress or mash or rub the eye ever    Explained the diagnoses, plan, and follow up with the patient and they expressed understanding.  Patient expressed understanding of the importance of proper follow up care.   Clent Demark Anavictoria Wilk M.D. Diseases & Surgery of the Retina and Vitreous Retina & Diabetic [place] 12/09/20     Abbreviations: M myopia (nearsighted); A astigmatism; H hyperopia (farsighted); P presbyopia; Mrx spectacle prescription;  CTL contact lenses;  OD right eye; OS left eye; OU both eyes  XT exotropia; ET esotropia; PEK punctate epithelial keratitis; PEE punctate epithelial erosions; DES dry eye syndrome; MGD meibomian gland dysfunction; ATs artificial tears; PFAT's preservative free artificial tears; [place] nuclear sclerotic cataract; PSC posterior subcapsular cataract; ERM epi-retinal membrane; PVD posterior vitreous detachment; RD retinal detachment; DM diabetes mellitus; DR diabetic retinopathy; NPDR non-proliferative diabetic retinopathy;  PDR proliferative diabetic retinopathy; CSME clinically significant macular edema; DME diabetic macular edema; dbh dot blot hemorrhages; CWS cotton wool spot; POAG primary open angle glaucoma; C/D cup-to-disc ratio; HVF humphrey visual field; GVF goldmann visual field; OCT optical coherence tomography; IOP intraocular pressure; BRVO Branch retinal vein occlusion; CRVO central retinal vein occlusion; CRAO central retinal artery occlusion; BRAO branch retinal artery occlusion; RT retinal tear; SB scleral buckle; PPV pars plana vitrectomy; VH Vitreous hemorrhage; PRP panretinal laser photocoagulation; IVK intravitreal kenalog; VMT vitreomacular traction; MH Macular hole;  NVD neovascularization of the disc; NVE neovascularization elsewhere; AREDS age related eye disease study; ARMD age related macular degeneration; POAG primary open angle glaucoma; EBMD epithelial/anterior basement membrane dystrophy; ACIOL anterior chamber intraocular lens; IOL intraocular lens; PCIOL posterior chamber intraocular lens; Phaco/IOL phacoemulsification with intraocular lens placement; [place] photorefractive keratectomy; LASIK laser assisted in situ keratomileusis; HTN hypertension; DM diabetes mellitus; COPD chronic obstructive pulmonary disease

## 2020-12-09 NOTE — Assessment & Plan Note (Signed)
No lifting and bending for 1 week. No water in the eye for 10 days. Do not rub the eye. Wear shield at night for 1-3 days.  Wear your CPAP as normal, if instructed by your doctor.  Continue your topical medications for a total of 3 weeks.  Do not refill your postoperative medications unless instructed.   

## 2020-12-09 NOTE — Patient Instructions (Signed)
Patient instructed to restart topical eyedrops right eye  Prednisolone acetate 1 drop right eye 4 times daily  Ofloxacin 1 drop right eye 4 times daily  Patient instructed not to compress or mash or rub the eye ever

## 2020-12-16 ENCOUNTER — Ambulatory Visit (INDEPENDENT_AMBULATORY_CARE_PROVIDER_SITE_OTHER): Payer: Medicare PPO | Admitting: Ophthalmology

## 2020-12-16 ENCOUNTER — Encounter (INDEPENDENT_AMBULATORY_CARE_PROVIDER_SITE_OTHER): Payer: Self-pay | Admitting: Ophthalmology

## 2020-12-16 ENCOUNTER — Other Ambulatory Visit: Payer: Self-pay

## 2020-12-16 DIAGNOSIS — Z09 Encounter for follow-up examination after completed treatment for conditions other than malignant neoplasm: Secondary | ICD-10-CM

## 2020-12-16 DIAGNOSIS — H35371 Puckering of macula, right eye: Secondary | ICD-10-CM | POA: Diagnosis not present

## 2020-12-16 DIAGNOSIS — H35352 Cystoid macular degeneration, left eye: Secondary | ICD-10-CM

## 2020-12-16 NOTE — Progress Notes (Signed)
12/16/2020     CHIEF COMPLAINT Patient presents for Post-op Follow-up (1 WK FU OD, Sx 12/08/20///Pt reports vision stable OD, no F/F, no pain or pressure. ////Last BS: unsure, doesn't check regularly.              A1C: around 6 taken 08/2020)   HISTORY OF PRESENT ILLNESS: Alexandria Moody is a 74 y.o. female who presents to the clinic today for:   HPI    Post-op Follow-up    In right eye.  Vision is stable. Additional comments: 1 WK FU OD, Sx 12/08/20   Pt reports vision stable OD, no F/F, no pain or pressure.     Last BS: unsure, doesn't check regularly.              A1C: around 6 taken 08/2020       Last edited by Nichola Sizer D on 12/16/2020  9:36 AM. (History)      Referring physician: No referring provider defined for this encounter.  HISTORICAL INFORMATION:   Selected notes from the MEDICAL RECORD NUMBER       CURRENT MEDICATIONS: Current Outpatient Medications (Ophthalmic Drugs)  Medication Sig  . ofloxacin (OCUFLOX) 0.3 % ophthalmic solution Place 1 drop into the right eye 4 (four) times daily.  . prednisoLONE acetate (PRED FORTE) 1 % ophthalmic suspension Place 1 drop into the right eye 4 (four) times daily.  . Bromfenac Sodium 0.07 % SOLN Place 1 drop into the left eye every morning.   No current facility-administered medications for this visit. (Ophthalmic Drugs)   Current Outpatient Medications (Other)  Medication Sig  . acetaminophen (TYLENOL) 325 MG tablet Take by mouth.  Marland Kitchen albuterol (VENTOLIN HFA) 108 (90 Base) MCG/ACT inhaler Inhale into the lungs.  Marland Kitchen amLODipine (NORVASC) 5 MG tablet Take 1 tablet by mouth 2 (two) times daily.  Marland Kitchen aspirin 81 MG EC tablet Take by mouth.  . Aspirin Buf,CaCarb-MgCarb-MgO, 81 MG TABS Take 1 tablet by mouth daily.  . calcitRIOL (ROCALTROL) 0.25 MCG capsule Take by mouth.  . citalopram (CELEXA) 20 MG tablet Take by mouth.  . furosemide (LASIX) 20 MG tablet Take by mouth.  . hydrALAZINE (APRESOLINE) 50 MG tablet Take  by mouth.  . insulin glargine, 1 Unit Dial, (TOUJEO SOLOSTAR) 300 UNIT/ML Solostar Pen   . isosorbide mononitrate (IMDUR) 120 MG 24 hr tablet Take by mouth.  . levothyroxine (SYNTHROID) 75 MCG tablet Take by mouth.  . linagliptin (TRADJENTA) 5 MG TABS tablet 1 (one) time each day  . Omega-3 Fatty Acids (FISH OIL) 1000 MG CAPS Take by mouth.  Marland Kitchen omeprazole (PRILOSEC) 20 MG capsule Take 1 capsule by mouth daily.  . ranolazine (RANEXA) 500 MG 12 hr tablet Take by mouth.  . rosuvastatin (CRESTOR) 10 MG tablet Take by mouth.  . temazepam (RESTORIL) 15 MG capsule Take by mouth.  . Tiotropium Bromide-Olodaterol 2.5-2.5 MCG/ACT AERS Inhale into the lungs.   No current facility-administered medications for this visit. (Other)      REVIEW OF SYSTEMS:    ALLERGIES No Known Allergies  PAST MEDICAL HISTORY Past Medical History:  Diagnosis Date  . Diabetes mellitus without complication ([place])   . Hypertension    Past Surgical History:  Procedure Laterality Date  . CATARACT EXTRACTION Left 2014  . EYE SURGERY Left 07/22/2020   Dr. Towanda Octave, [facility] and [facility] Lamas    FAMILY HISTORY History reviewed. No pertinent family history.  SOCIAL HISTORY Social History   Tobacco Use  . Smoking status:  Never Smoker  . Smokeless tobacco: Never Used         OPHTHALMIC EXAM: Base Eye Exam    Visual Acuity (ETDRS)      Right Left   Dist [place] 20/70 20/50 -1   Dist ph [place] 20/60 -2 20/40       Tonometry (Tonopen, 9:44 AM)      Right Left   Pressure 16 18       Pupils      Pupils Dark Light Shape React APD   Right PERRL 5 4 Round Slow None   Left PERRL 5 4 Round Slow None       Visual Fields (Counting fingers)      Left Right    Full Full       Extraocular Movement      Right Left    Full Full       Neuro/Psych    Oriented x3: Yes   Mood/Affect: Normal       Dilation    Right eye: 1.0% Mydriacyl, 2.5% Phenylephrine @ 9:44 AM        Slit Lamp and Fundus Exam    External  Exam      Right Left   External Normal Normal       Slit Lamp Exam      Right Left   Lids/Lashes Normal Normal   Conjunctiva/Sclera White and quiet White and quiet   Cornea Clear Clear   Anterior Chamber Deep and quiet Deep and quiet   Iris Round and reactive Round and reactive   Lens Centered posterior chamber intraocular lens Centered posterior chamber intraocular lens   Anterior Vitreous Normal Normal       Fundus Exam      Right Left   Posterior Vitreous Clear, post vitrectomy    Disc Normal    C/D Ratio 0.25    Macula Small intraretinal hemorrhage, found at time of surgical intervention, likely diabetic related. No topographic distortion now    Vessels NPDR- Mild    Periphery Normal           IMAGING AND PROCEDURES  Imaging and Procedures for 12/16/20  OCT, Retina - OU - Both Eyes       Right Eye Quality was good. Scan locations included subfoveal. Central Foveal Thickness: 352. Progression has improved.   Left Eye Quality was good. Scan locations included subfoveal. Central Foveal Thickness: 281. Progression has been stable.   Notes OD with minor intraretinal fluid, status post vitrectomy membrane peel 1 week previous, much less thickening, much less outer retinal disturbance                ASSESSMENT/PLAN:  Cystoid macular edema of left eye OS, improved and stable, may discontinue 6 PROLENSA use  Macular pucker, right eye 1 week post vitrectomy membrane peel, macular improving in topography and vision improving in acuity      ICD-10-CM   1. Postoperative follow-up  Z09 OCT, Retina - OU - Both Eyes  2. Cystoid macular edema of left eye  H35.352   3. Macular pucker, right eye  H35.371     1.  2.  3.  Ophthalmic Meds Ordered this visit:  No orders of the defined types were placed in this encounter.      Return in about 3 months (around 03/16/2021) for DILATE OU, OCT.  Patient Instructions  Patient instructed to continue on the  topical drops for 2 more weeks and to not to refill  the drops should they be complete before that time.      Explained the diagnoses, plan, and follow up with the patient and they expressed understanding.  Patient expressed understanding of the importance of proper follow up care.   Clent Demark Gilverto Dileonardo M.D. Diseases & Surgery of the Retina and Vitreous [facility] [place] 12/16/20     Abbreviations: M myopia (nearsighted); A astigmatism; H hyperopia (farsighted); P presbyopia; Mrx spectacle prescription;  CTL contact lenses; OD right eye; OS left eye; OU both eyes  XT exotropia; ET esotropia; PEK punctate epithelial keratitis; PEE punctate epithelial erosions; DES dry eye syndrome; MGD meibomian gland dysfunction; ATs artificial tears; PFAT's preservative free artificial tears; [place] nuclear sclerotic cataract; PSC posterior subcapsular cataract; ERM epi-retinal membrane; PVD posterior vitreous detachment; RD retinal detachment; DM diabetes mellitus; DR diabetic retinopathy; NPDR non-proliferative diabetic retinopathy; PDR proliferative diabetic retinopathy; CSME clinically significant macular edema; DME diabetic macular edema; dbh dot blot hemorrhages; CWS cotton wool spot; POAG primary open angle glaucoma; C/D cup-to-disc ratio; HVF humphrey visual field; GVF goldmann visual field; OCT optical coherence tomography; IOP intraocular pressure; BRVO Branch retinal vein occlusion; CRVO central retinal vein occlusion; CRAO central retinal artery occlusion; BRAO branch retinal artery occlusion; RT retinal tear; SB scleral buckle; PPV pars plana vitrectomy; VH Vitreous hemorrhage; PRP panretinal laser photocoagulation; IVK intravitreal kenalog; VMT vitreomacular traction; MH Macular hole;  NVD neovascularization of the disc; NVE neovascularization elsewhere; AREDS age related eye disease study; ARMD age related macular degeneration; POAG primary open angle glaucoma; EBMD epithelial/anterior basement  membrane dystrophy; ACIOL anterior chamber intraocular lens; IOL intraocular lens; PCIOL posterior chamber intraocular lens; Phaco/IOL phacoemulsification with intraocular lens placement; [place] photorefractive keratectomy; LASIK laser assisted in situ keratomileusis; HTN hypertension; DM diabetes mellitus; COPD chronic obstructive pulmonary disease

## 2020-12-16 NOTE — Patient Instructions (Signed)
Patient instructed to continue on the topical drops for 2 more weeks and to not to refill the drops should they be complete before that time.

## 2020-12-16 NOTE — Assessment & Plan Note (Signed)
1 week post vitrectomy membrane peel, macular improving in topography and vision improving in acuity

## 2020-12-16 NOTE — Assessment & Plan Note (Signed)
OS, improved and stable, may discontinue 6 PROLENSA use

## 2020-12-21 ENCOUNTER — Ambulatory Visit (INDEPENDENT_AMBULATORY_CARE_PROVIDER_SITE_OTHER): Payer: Medicare PPO | Admitting: Vascular Surgery

## 2020-12-21 ENCOUNTER — Other Ambulatory Visit: Payer: Self-pay

## 2020-12-21 VITALS — BP 154/67 | HR 57 | Ht 63.0 in | Wt 155.0 lb

## 2020-12-21 DIAGNOSIS — I70212 Atherosclerosis of native arteries of extremities with intermittent claudication, left leg: Secondary | ICD-10-CM

## 2020-12-21 DIAGNOSIS — F325 Major depressive disorder, single episode, in full remission: Secondary | ICD-10-CM | POA: Insufficient documentation

## 2020-12-21 DIAGNOSIS — N184 Chronic kidney disease, stage 4 (severe): Secondary | ICD-10-CM

## 2020-12-21 DIAGNOSIS — I10 Essential (primary) hypertension: Secondary | ICD-10-CM | POA: Diagnosis not present

## 2020-12-21 DIAGNOSIS — E1122 Type 2 diabetes mellitus with diabetic chronic kidney disease: Secondary | ICD-10-CM | POA: Diagnosis not present

## 2020-12-21 DIAGNOSIS — E079 Disorder of thyroid, unspecified: Secondary | ICD-10-CM | POA: Insufficient documentation

## 2020-12-21 DIAGNOSIS — I6523 Occlusion and stenosis of bilateral carotid arteries: Secondary | ICD-10-CM | POA: Insufficient documentation

## 2020-12-21 DIAGNOSIS — E559 Vitamin D deficiency, unspecified: Secondary | ICD-10-CM | POA: Insufficient documentation

## 2020-12-21 DIAGNOSIS — I25708 Atherosclerosis of coronary artery bypass graft(s), unspecified, with other forms of angina pectoris: Secondary | ICD-10-CM | POA: Insufficient documentation

## 2020-12-21 DIAGNOSIS — K219 Gastro-esophageal reflux disease without esophagitis: Secondary | ICD-10-CM | POA: Insufficient documentation

## 2020-12-21 DIAGNOSIS — E039 Hypothyroidism, unspecified: Secondary | ICD-10-CM | POA: Insufficient documentation

## 2020-12-21 DIAGNOSIS — I5032 Chronic diastolic (congestive) heart failure: Secondary | ICD-10-CM | POA: Insufficient documentation

## 2020-12-21 DIAGNOSIS — H903 Sensorineural hearing loss, bilateral: Secondary | ICD-10-CM | POA: Insufficient documentation

## 2020-12-21 DIAGNOSIS — L219 Seborrheic dermatitis, unspecified: Secondary | ICD-10-CM | POA: Insufficient documentation

## 2020-12-21 NOTE — Patient Instructions (Signed)
Peripheral Vascular Disease  Peripheral vascular disease (PVD) is a disease of the blood vessels that are not part of your heart and brain. A simple term for PVD is poor circulation. In most cases, PVD narrows the blood vessels that carry blood from your heart to the rest of your body. This can reduce the supply of blood to your arms, legs, and internal organs, like your stomach or kidneys. However, PVD most often affects a person's lower legs and feet. Without treatment, PVD tends to get worse. PVD can also lead to acute ischemic limb. This is when an arm or leg suddenly cannot get enough blood. This is a medical emergency. Follow these instructions at home: Lifestyle  Do not use any products that contain nicotine or tobacco, such as cigarettes and e-cigarettes. If you need help quitting, ask your doctor.  Lose weight if you are overweight. Or, stay at a healthy weight as told by your doctor.  Eat a diet that is low in fat and cholesterol. If you need help, ask your doctor.  Exercise regularly. Ask your doctor for activities that are right for you. General instructions  Take over-the-counter and prescription medicines only as told by your doctor.  Take good care of your feet: ? Wear comfortable shoes that fit well. ? Check your feet often for any cuts or sores.  Keep all follow-up visits as told by your doctor This is important. Contact a doctor if:  You have cramps in your legs when you walk.  You have leg pain when you are at rest.  You have coldness in a leg or foot.  Your skin changes.  You are unable to get or have an erection (erectile dysfunction).  You have cuts or sores on your feet that do not heal. Get help right away if:  Your arm or leg turns cold, numb, and blue.  Your arms or legs become red, warm, swollen, painful, or numb.  You have chest pain.  You have trouble breathing.  You suddenly have weakness in your face, arm, or leg.  You become very  confused or you cannot speak.  You suddenly have a very bad headache.  You suddenly cannot see. Summary  Peripheral vascular disease (PVD) is a disease of the blood vessels.  A simple term for PVD is poor circulation. Without treatment, PVD tends to get worse.  Treatment may include exercise, low fat and low cholesterol diet, and quitting smoking. This information is not intended to replace advice given to you by your health care provider. Make sure you discuss any questions you have with your health care provider. Document Revised: 11/16/2017 Document Reviewed: 01/11/2017 Elsevier Patient Education  2020 [employer]  

## 2020-12-21 NOTE — Assessment & Plan Note (Signed)
The patient describes moderate distance claudication of the left lower extremity.  This is somewhat lifestyle limiting but she does not have any limb threatening symptoms.  We had a long talk today.  I told her that I thought her disease process would likely be able to be treated with endovascular therapy and not open surgical therapy, but with her significant chronic kidney disease that still carries with it a reasonable risk of renal dysfunction from the contrast.  I have discussed that she had limb threatening symptoms, I would recommend proceeding but with claudication the decision is up to her.  She does not want to have anything done.  I told her that is okay.  Her last check was within the last few months so we will plan on seeing her in about 6 months with noninvasive studies for follow-up.  If she develops limb threatening symptoms such as ulceration rest pain in the interim, she is instructed to call our office.  She will continue her current medical regimen.

## 2020-12-21 NOTE — Assessment & Plan Note (Signed)
blood glucose control important in reducing the progression of atherosclerotic disease. Also, involved in wound healing. On appropriate medications.  

## 2020-12-21 NOTE — Progress Notes (Signed)
Patient ID: Alexandria Moody, female   DOB: [birth year], 75 y.o.   MRN: [id number]  Chief Complaint  Patient presents with  . New Patient (Initial Visit)  . PVD    HPI Alexandria Moody is a 75 y.o. female.  I am asked to see the patient by Dr. Holley Raring for evaluation of PAD.  She has been told by previous surgeon that she had a left thigh blockage that was complete and would require surgery to fix.  She does have claudication at moderate distances in the left leg.  She gets cramping and pain in her calf that is relieved with rest.  She denies any open wounds or infection.  No pain that wakes her from sleep.  She does not have to dangle her foot off of the bed.  She has stage IV chronic kidney disease and has not wanted to have anything invasive done unless absolutely necessary.     Past Medical History:  Diagnosis Date  . Diabetes mellitus without complication ([place])   . Hypertension   CKD stage 4  Past Surgical History:  Procedure Laterality Date  . CATARACT EXTRACTION Left 2014  . EYE SURGERY Left 07/22/2020   Dr. Towanda Octave, Vit and [facility] Lamas     Family History No bleeding disorders, clotting disorders, aneurysms, autoimmune diseases, or pophyria  Social History   Tobacco Use  . Smoking status: Never Smoker  . Smokeless tobacco: Never Used  no alcohol or drug abuse  Allergies  Allergen Reactions  . Other Other (See Comments)    Tears skin    Current Outpatient Medications  Medication Sig Dispense Refill  . acetaminophen (TYLENOL) 325 MG tablet Take by mouth.    Marland Kitchen albuterol (VENTOLIN HFA) 108 (90 Base) MCG/ACT inhaler Inhale into the lungs.    Marland Kitchen amLODipine (NORVASC) 5 MG tablet Take 1 tablet by mouth 2 (two) times daily.    Marland Kitchen aspirin 81 MG EC tablet Take by mouth.    . Aspirin Buf,CaCarb-MgCarb-MgO, 81 MG TABS Take 1 tablet by mouth daily.    . Bromfenac Sodium 0.07 % SOLN Place 1 drop into the left eye every morning. 5 mL 3  . calcitRIOL (ROCALTROL) 0.25 MCG capsule Take by  mouth.    . Cholecalciferol 125 MCG (5000 UT) capsule Take by mouth.    . citalopram (CELEXA) 20 MG tablet Take by mouth.    . furosemide (LASIX) 20 MG tablet Take by mouth.    . hydrALAZINE (APRESOLINE) 50 MG tablet Take by mouth.    . insulin glargine, 1 Unit Dial, (TOUJEO SOLOSTAR) 300 UNIT/ML Solostar Pen     . isosorbide mononitrate (IMDUR) 120 MG 24 hr tablet Take by mouth.    . levothyroxine (SYNTHROID) 75 MCG tablet Take by mouth.    . linagliptin (TRADJENTA) 5 MG TABS tablet 1 (one) time each day    . nitroGLYCERIN (NITROSTAT) 0.4 MG SL tablet PLACE 1 TABLET UNDER THE TONGUE EVERY 5 MINUTES AS NEEDED FOR CHEST PAIN. MAY TAKE UP TO 3 DOSES    . ofloxacin (OCUFLOX) 0.3 % ophthalmic solution Place 1 drop into the right eye 4 (four) times daily. 5 mL 0  . Omega-3 Fatty Acids (FISH OIL) 1000 MG CAPS Take by mouth.    Marland Kitchen omeprazole (PRILOSEC) 20 MG capsule Take 1 capsule by mouth daily.    . prednisoLONE acetate (PRED FORTE) 1 % ophthalmic suspension Place 1 drop into the right eye 4 (four) times daily. 10 mL 0  .  ranolazine (RANEXA) 500 MG 12 hr tablet Take by mouth.    . rosuvastatin (CRESTOR) 10 MG tablet Take by mouth.    . temazepam (RESTORIL) 15 MG capsule Take by mouth.    . Tiotropium Bromide-Olodaterol 2.5-2.5 MCG/ACT AERS Inhale into the lungs.     No current facility-administered medications for this visit.      REVIEW OF SYSTEMS (Negative unless checked)  Constitutional: [] Weight loss  [] Fever  [] Chills Cardiac: [] Chest pain   [] Chest pressure   [] Palpitations   [] Shortness of breath when laying flat   [] Shortness of breath at rest   [] Shortness of breath with exertion. Vascular:  [x] Pain in legs with walking   [] Pain in legs at rest   [] Pain in legs when laying flat   [x] Claudication   [] Pain in feet when walking  [] Pain in feet at rest  [] Pain in feet when laying flat   [] History of DVT   [] Phlebitis   [] Swelling in legs   [] Varicose veins   [] Non-healing  ulcers Pulmonary:   [] Uses home oxygen   [] Productive cough   [] Hemoptysis   [] Wheeze  [] COPD   [] Asthma Neurologic:  [] Dizziness  [] Blackouts   [] Seizures   [] History of stroke   [] History of TIA  [] Aphasia   [] Temporary blindness   [] Dysphagia   [] Weakness or numbness in arms   [] Weakness or numbness in legs Musculoskeletal:  [x] Arthritis   [] Joint swelling   [x] Joint pain   [] Low back pain Hematologic:  [] Easy bruising  [] Easy bleeding   [] Hypercoagulable state   [] Anemic  [] Hepatitis Gastrointestinal:  [] Blood in stool   [] Vomiting blood  [] Gastroesophageal reflux/heartburn   [] Abdominal pain Genitourinary:  [x] Chronic kidney disease   [] Difficult urination  [] Frequent urination  [] Burning with urination   [] Hematuria Skin:  [] Rashes   [] Ulcers   [] Wounds Psychological:  [] History of anxiety   []  History of major depression.    Physical Exam BP (!) 154/67   Pulse (!) 57   Ht 5\' 3"  (1.6 m)   Wt 155 lb (70.3 kg)   BMI 27.46 kg/m  Gen:  WD/WN, NAD Head: [place]/AT, No temporalis wasting.  Ear/Nose/Throat: Hearing grossly intact, nares w/o erythema or drainage, oropharynx w/o Erythema/Exudate Eyes: Conjunctiva clear, sclera non-icteric  Neck: trachea midline.  No JVD.  Pulmonary:  Good air movement, respirations not labored, no use of accessory muscles  Cardiac: RRR, no JVD Vascular:  Vessel Right Left  Radial Palpable Palpable                          DP 2+ 1+  PT 1+ 1+   Gastrointestinal:. No masses, surgical incisions, or scars. Musculoskeletal: M/S 5/5 throughout.  Extremities without ischemic changes.  No deformity or atrophy. Trace LE edema. Neurologic: Sensation grossly intact in extremities.  Symmetrical.  Speech is fluent. Motor exam as listed above. Psychiatric: Judgment intact, Mood & affect appropriate for pt's clinical situation. Dermatologic: No rashes or ulcers noted.  No cellulitis or open wounds.    Radiology OCT, Retina - OU - Both Eyes  Result Date:  12/16/2020 Right Eye Quality was good. Scan locations included subfoveal. Central Foveal Thickness: 352. Progression has improved. Left Eye Quality was good. Scan locations included subfoveal. Central Foveal Thickness: 281. Progression has been stable. Notes OD with minor intraretinal fluid, status post vitrectomy membrane peel 1 week previous, much less thickening, much less outer retinal disturbance   Labs No results found for this or any  previous visit (from the past 2160 hour(s)).  Assessment/Plan:  Atherosclerosis of native arteries of extremity with intermittent claudication ([place]) The patient describes moderate distance claudication of the left lower extremity.  This is somewhat lifestyle limiting but she does not have any limb threatening symptoms.  We had a long talk today.  I told her that I thought her disease process would likely be able to be treated with endovascular therapy and not open surgical therapy, but with her significant chronic kidney disease that still carries with it a reasonable risk of renal dysfunction from the contrast.  I have discussed that she had limb threatening symptoms, I would recommend proceeding but with claudication the decision is up to her.  She does not want to have anything done.  I told her that is okay.  Her last check was within the last few months so we will plan on seeing her in about 6 months with noninvasive studies for follow-up.  If she develops limb threatening symptoms such as ulceration rest pain in the interim, she is instructed to call our office.  She will continue her current medical regimen.  Chronic kidney disease, stage 4 (severe) ([facility]) She has fairly severe chronic kidney disease and contrast for intervention is certainly a concern for worsening her renal dysfunction.  As long she does not have limb threatening symptoms, she prefers medical management which I am in agreement with.  Type 2 diabetes mellitus ([facility]) blood glucose control  important in reducing the progression of atherosclerotic disease. Also, involved in wound healing. On appropriate medications.   Essential hypertension blood pressure control important in reducing the progression of atherosclerotic disease. On appropriate oral medications.       Leotis Pain 12/21/2020, 4:31 PM   This note was created with Dragon medical transcription system.  Any errors from dictation are unintentional.

## 2020-12-21 NOTE — Assessment & Plan Note (Signed)
She has fairly severe chronic kidney disease and contrast for intervention is certainly a concern for worsening her renal dysfunction.  As long she does not have limb threatening symptoms, she prefers medical management which I am in agreement with.

## 2020-12-21 NOTE — Assessment & Plan Note (Signed)
blood pressure control important in reducing the progression of atherosclerotic disease. On appropriate oral medications.  

## 2020-12-24 DIAGNOSIS — D692 Other nonthrombocytopenic purpura: Secondary | ICD-10-CM | POA: Insufficient documentation

## 2021-02-01 DIAGNOSIS — E538 Deficiency of other specified B group vitamins: Secondary | ICD-10-CM | POA: Insufficient documentation

## 2021-02-16 ENCOUNTER — Other Ambulatory Visit: Admission: RE | Admit: 2021-02-16 | Payer: Medicare PPO | Source: Ambulatory Visit

## 2021-02-18 ENCOUNTER — Ambulatory Visit: Admit: 2021-02-18 | Payer: Medicare PPO

## 2021-02-18 SURGERY — COLONOSCOPY WITH PROPOFOL
Anesthesia: General

## 2021-03-17 ENCOUNTER — Other Ambulatory Visit: Payer: Self-pay

## 2021-03-17 ENCOUNTER — Encounter (INDEPENDENT_AMBULATORY_CARE_PROVIDER_SITE_OTHER): Payer: Self-pay | Admitting: Ophthalmology

## 2021-03-17 ENCOUNTER — Ambulatory Visit (INDEPENDENT_AMBULATORY_CARE_PROVIDER_SITE_OTHER): Payer: Medicare PPO | Admitting: Ophthalmology

## 2021-03-17 DIAGNOSIS — H35351 Cystoid macular degeneration, right eye: Secondary | ICD-10-CM

## 2021-03-17 DIAGNOSIS — H35352 Cystoid macular degeneration, left eye: Secondary | ICD-10-CM

## 2021-03-17 DIAGNOSIS — H35371 Puckering of macula, right eye: Secondary | ICD-10-CM | POA: Diagnosis not present

## 2021-03-17 DIAGNOSIS — E113393 Type 2 diabetes mellitus with moderate nonproliferative diabetic retinopathy without macular edema, bilateral: Secondary | ICD-10-CM | POA: Diagnosis not present

## 2021-03-17 NOTE — Progress Notes (Signed)
03/17/2021     CHIEF COMPLAINT Patient presents for Retina Follow Up (3 Mo F/U OU//Pt sts she needs new glasses for reading up close OU. Pt denies ocular pain, flashes, or floaters OU. Pt sts she is starting dialysis soon./A1c: 6.9, 3 mo ago approx/LBS: pt does not check)   HISTORY OF PRESENT ILLNESS: Alexandria Moody is a 75 y.o. female who presents to the clinic today for:   HPI    Retina Follow Up    Patient presents with  Diabetic Retinopathy.  In both eyes.  This started 3 months ago.  Severity is moderate.  Duration of 3 months.  Since onset it is stable. Additional comments: 3 Mo F/U OU  Pt sts she needs new glasses for reading up close OU. Pt denies ocular pain, flashes, or floaters OU. Pt sts she is starting dialysis soon. A1c: 6.9, 3 mo ago approx LBS: pt does not check       Last edited by Rockie Neighbours, [place] on 03/17/2021 10:44 AM. (History)      Referring physician: No referring provider defined for this encounter.  HISTORICAL INFORMATION:   Selected notes from the MEDICAL RECORD NUMBER       CURRENT MEDICATIONS: Current Outpatient Medications (Ophthalmic Drugs)  Medication Sig  . Bromfenac Sodium 0.07 % SOLN Place 1 drop into the left eye every morning. (Patient not taking: Reported on 03/17/2021)  . ofloxacin (OCUFLOX) 0.3 % ophthalmic solution Place 1 drop into the right eye 4 (four) times daily. (Patient not taking: Reported on 03/17/2021)  . prednisoLONE acetate (PRED FORTE) 1 % ophthalmic suspension Place 1 drop into the right eye 4 (four) times daily. (Patient not taking: Reported on 03/17/2021)   No current facility-administered medications for this visit. (Ophthalmic Drugs)   Current Outpatient Medications (Other)  Medication Sig  . acetaminophen (TYLENOL) 325 MG tablet Take by mouth.  Marland Kitchen albuterol (VENTOLIN HFA) 108 (90 Base) MCG/ACT inhaler Inhale into the lungs.  Marland Kitchen amLODipine (NORVASC) 5 MG tablet Take 1 tablet by mouth 2 (two) times daily.  Marland Kitchen aspirin  81 MG EC tablet Take by mouth.  . Aspirin Buf,CaCarb-MgCarb-MgO, 81 MG TABS Take 1 tablet by mouth daily.  . calcitRIOL (ROCALTROL) 0.25 MCG capsule Take by mouth.  . Cholecalciferol 125 MCG (5000 UT) capsule Take by mouth.  . citalopram (CELEXA) 20 MG tablet Take by mouth.  . furosemide (LASIX) 20 MG tablet Take by mouth.  . hydrALAZINE (APRESOLINE) 50 MG tablet Take by mouth.  . insulin glargine, 1 Unit Dial, (TOUJEO SOLOSTAR) 300 UNIT/ML Solostar Pen   . isosorbide mononitrate (IMDUR) 120 MG 24 hr tablet Take by mouth.  . levothyroxine (SYNTHROID) 75 MCG tablet Take by mouth.  . linagliptin (TRADJENTA) 5 MG TABS tablet 1 (one) time each day  . nitroGLYCERIN (NITROSTAT) 0.4 MG SL tablet PLACE 1 TABLET UNDER THE TONGUE EVERY 5 MINUTES AS NEEDED FOR CHEST PAIN. MAY TAKE UP TO 3 DOSES  . Omega-3 Fatty Acids (FISH OIL) 1000 MG CAPS Take by mouth.  Marland Kitchen omeprazole (PRILOSEC) 20 MG capsule Take 1 capsule by mouth daily.  . ranolazine (RANEXA) 500 MG 12 hr tablet Take by mouth.  . rosuvastatin (CRESTOR) 10 MG tablet Take by mouth.  . temazepam (RESTORIL) 15 MG capsule Take by mouth.  . Tiotropium Bromide-Olodaterol 2.5-2.5 MCG/ACT AERS Inhale into the lungs.   No current facility-administered medications for this visit. (Other)      REVIEW OF SYSTEMS:    ALLERGIES Allergies  Allergen  Reactions  . Other Other (See Comments)    Tears skin    PAST MEDICAL HISTORY Past Medical History:  Diagnosis Date  . Diabetes mellitus without complication ([place])   . Hypertension    Past Surgical History:  Procedure Laterality Date  . CATARACT EXTRACTION Left 2014  . EYE SURGERY Left 07/22/2020   Dr. Towanda Octave, Vit and Mart[facility] Lamas    FAMILY HISTORY History reviewed. No pertinent family history.  SOCIAL HISTORY Social History   Tobacco Use  . Smoking status: Never Smoker  . Smokeless tobacco: Never Used         OPHTHALMIC EXAM:  Base Eye Exam    Visual Acuity (ETDRS)      Right Left    Dist cc 20/40 -2 20/30 -1   Dist ph cc NI NI   Correction: Glasses       Tonometry (Tonopen, 10:39 AM)      Right Left   Pressure 13 13       Pupils      Pupils Dark Light Shape React APD   Right PERRL 5 4 Round Slow None   Left PERRL 5 4 Round Slow None       Visual Fields (Counting fingers)      Left Right    Full Full       Extraocular Movement      Right Left    Full Full       Neuro/Psych    Oriented x3: Yes   Mood/Affect: Normal       Dilation    Both eyes: 1.0% Mydriacyl, 2.5% Phenylephrine @ 10:45 AM        Slit Lamp and Fundus Exam    External Exam      Right Left   External Normal Normal       Slit Lamp Exam      Right Left   Lids/Lashes Normal Normal   Conjunctiva/Sclera White and quiet White and quiet   Cornea Clear Clear   Anterior Chamber Deep and quiet Deep and quiet   Iris Round and reactive Round and reactive   Lens Centered posterior chamber intraocular lens Centered posterior chamber intraocular lens   Anterior Vitreous Normal Normal       Fundus Exam      Right Left   Posterior Vitreous Clear, post vitrectomy Clear vitrectomized   Disc Normal Normal   C/D Ratio 0.15 0.15   Macula Small intraretinal hemorrhage, found at time of surgical intervention, likely diabetic related. No topographic distortion now Microaneurysms, no macular thickening, no exudates, no clinically significant macular edema   Vessels NPDR- Moderate NPDR- Moderate   Periphery Normal Normal          IMAGING AND PROCEDURES  Imaging and Procedures for 03/17/21  OCT, Retina - OU - Both Eyes       Right Eye Quality was good. Scan locations included subfoveal. Central Foveal Thickness: 349. Progression has improved.   Left Eye Quality was good. Scan locations included subfoveal. Central Foveal Thickness: 308. Progression has been stable.   Notes OD with minor intraretinal fluid, status post vitrectomy membrane peel 1 week previous, much less  thickening, much less outer retinal disturbance                ASSESSMENT/PLAN:  Cystoid macular edema of right eye Minor yet slightly worse since last visit.  I did inquire the patient might have nightly hypoxic episodes triggered by undiagnosed or untreated sleep apnea.  Review of systems is negative.  Nonetheless she did report she is having trouble breathing due to kidney disease.  This suggest she may be having early uremia which is "renal retinopathy" could in fact cause secondary macular edema and otherwise normal macular perfusion state.  Experience from [facility] in my personal experience of last 30 years confirms is in numerous patients.  I believe that the CME in this right eye will likely improve spontaneously as she commences with dialysis therapy in the near future as she reports it is planned.  Macular pucker, right eye Condition resolved and overall improved acuity post vitrectomy membrane peel right eye  Non-proliferative diabetic retinopathy, moderate, both eyes (HCC) Progression to moderate NPDR likely secondary to ongoing uremia.      ICD-10-CM   1. Cystoid macular edema of left eye  H35.352 OCT, Retina - OU - Both Eyes  2. Cystoid macular edema of right eye  H35.351   3. Macular pucker, right eye  H35.371   4. Moderate nonproliferative diabetic retinopathy of both eyes without macular edema associated with type 2 diabetes mellitus ([place])  R67.8938     1.  CME of the right eye, worse, likely secondary to uremia, secondary to renal disease.  Follow-up with this is required to make sure this does resolve with institution of renal dialysis  2.  3.  Ophthalmic Meds Ordered this visit:  No orders of the defined types were placed in this encounter.      Return in about 4 months (around 07/17/2021) for DILATE OU, OCT.  There are no Patient Instructions on file for this visit.   Explained the diagnoses, plan, and follow up with the patient and  they expressed understanding.  Patient expressed understanding of the importance of proper follow up care.   Clent Demark Venba Zenner M.D. Diseases & Surgery of the Retina and Vitreous Retina & Diabetic [place] 03/17/21     Abbreviations: M myopia (nearsighted); A astigmatism; H hyperopia (farsighted); P presbyopia; Mrx spectacle prescription;  CTL contact lenses; OD right eye; OS left eye; OU both eyes  XT exotropia; ET esotropia; PEK punctate epithelial keratitis; PEE punctate epithelial erosions; DES dry eye syndrome; MGD meibomian gland dysfunction; ATs artificial tears; PFAT's preservative free artificial tears; [place] nuclear sclerotic cataract; PSC posterior subcapsular cataract; ERM epi-retinal membrane; PVD posterior vitreous detachment; RD retinal detachment; DM diabetes mellitus; DR diabetic retinopathy; NPDR non-proliferative diabetic retinopathy; PDR proliferative diabetic retinopathy; CSME clinically significant macular edema; DME diabetic macular edema; dbh dot blot hemorrhages; CWS cotton wool spot; POAG primary open angle glaucoma; C/D cup-to-disc ratio; HVF humphrey visual field; GVF goldmann visual field; OCT optical coherence tomography; IOP intraocular pressure; BRVO Branch retinal vein occlusion; CRVO central retinal vein occlusion; CRAO central retinal artery occlusion; BRAO branch retinal artery occlusion; RT retinal tear; SB scleral buckle; PPV pars plana vitrectomy; VH Vitreous hemorrhage; PRP panretinal laser photocoagulation; IVK intravitreal kenalog; VMT vitreomacular traction; MH Macular hole;  NVD neovascularization of the disc; NVE neovascularization elsewhere; AREDS age related eye disease study; ARMD age related macular degeneration; POAG primary open angle glaucoma; EBMD epithelial/anterior basement membrane dystrophy; ACIOL anterior chamber intraocular lens; IOL intraocular lens; PCIOL posterior chamber intraocular lens; Phaco/IOL phacoemulsification with intraocular lens  placement; [place] photorefractive keratectomy; LASIK laser assisted in situ keratomileusis; HTN hypertension; DM diabetes mellitus; COPD chronic obstructive pulmonary disease

## 2021-03-17 NOTE — Assessment & Plan Note (Signed)
Minor yet slightly worse since last visit.  I did inquire the patient might have nightly hypoxic episodes triggered by undiagnosed or untreated sleep apnea.  Review of systems is negative.  Nonetheless she did report she is having trouble breathing due to kidney disease.  This suggest she may be having early uremia which is "renal retinopathy" could in fact cause secondary macular edema and otherwise normal macular perfusion state.  Experience from [facility] in my personal experience of last 30 years confirms is in numerous patients.  I believe that the CME in this right eye will likely improve spontaneously as she commences with dialysis therapy in the near future as she reports it is planned.

## 2021-03-17 NOTE — Assessment & Plan Note (Signed)
Progression to moderate NPDR likely secondary to ongoing uremia.

## 2021-03-17 NOTE — Assessment & Plan Note (Signed)
Condition resolved and overall improved acuity post vitrectomy membrane peel right eye

## 2021-03-24 DIAGNOSIS — N2581 Secondary hyperparathyroidism of renal origin: Secondary | ICD-10-CM | POA: Insufficient documentation

## 2021-04-05 DIAGNOSIS — I7782 Antineutrophilic cytoplasmic antibody (ANCA) vasculitis: Secondary | ICD-10-CM | POA: Insufficient documentation

## 2021-04-05 DIAGNOSIS — M810 Age-related osteoporosis without current pathological fracture: Secondary | ICD-10-CM | POA: Insufficient documentation

## 2021-04-06 ENCOUNTER — Encounter (INDEPENDENT_AMBULATORY_CARE_PROVIDER_SITE_OTHER): Payer: Self-pay

## 2021-04-12 ENCOUNTER — Other Ambulatory Visit: Payer: Self-pay

## 2021-04-12 ENCOUNTER — Inpatient Hospital Stay: Payer: Medicare PPO

## 2021-04-12 ENCOUNTER — Inpatient Hospital Stay: Payer: Medicare PPO | Attending: Oncology | Admitting: Oncology

## 2021-04-12 ENCOUNTER — Encounter (INDEPENDENT_AMBULATORY_CARE_PROVIDER_SITE_OTHER): Payer: Medicare PPO | Admitting: Ophthalmology

## 2021-04-12 VITALS — BP 129/47 | HR 51 | Temp 97.2°F | Resp 18 | Wt 149.9 lb

## 2021-04-12 DIAGNOSIS — D631 Anemia in chronic kidney disease: Secondary | ICD-10-CM

## 2021-04-12 DIAGNOSIS — N184 Chronic kidney disease, stage 4 (severe): Secondary | ICD-10-CM

## 2021-04-12 DIAGNOSIS — D539 Nutritional anemia, unspecified: Secondary | ICD-10-CM

## 2021-04-12 LAB — IRON AND TIBC
Iron: 46 ug/dL (ref 28–170)
Saturation Ratios: 21 % (ref 10.4–31.8)
TIBC: 220 ug/dL — ABNORMAL LOW (ref 250–450)
UIBC: 174 ug/dL

## 2021-04-12 LAB — CBC WITH DIFFERENTIAL/PLATELET
Abs Immature Granulocytes: 0.03 10*3/uL (ref 0.00–0.07)
Basophils Absolute: 0 10*3/uL (ref 0.0–0.1)
Basophils Relative: 0 %
Eosinophils Absolute: 0 10*3/uL (ref 0.0–0.5)
Eosinophils Relative: 0 %
HCT: 23.5 % — ABNORMAL LOW (ref 36.0–46.0)
Hemoglobin: 7.8 g/dL — ABNORMAL LOW (ref 12.0–15.0)
Immature Granulocytes: 1 %
Lymphocytes Relative: 14 %
Lymphs Abs: 0.8 10*3/uL (ref 0.7–4.0)
MCH: 34.2 pg — ABNORMAL HIGH (ref 26.0–34.0)
MCHC: 33.2 g/dL (ref 30.0–36.0)
MCV: 103.1 fL — ABNORMAL HIGH (ref 80.0–100.0)
Monocytes Absolute: 0.4 10*3/uL (ref 0.1–1.0)
Monocytes Relative: 8 %
Neutro Abs: 4.5 10*3/uL (ref 1.7–7.7)
Neutrophils Relative %: 77 %
Platelets: 499 10*3/uL — ABNORMAL HIGH (ref 150–400)
RBC: 2.28 MIL/uL — ABNORMAL LOW (ref 3.87–5.11)
RDW: 19.9 % — ABNORMAL HIGH (ref 11.5–15.5)
WBC: 5.8 10*3/uL (ref 4.0–10.5)
nRBC: 0 % (ref 0.0–0.2)

## 2021-04-12 LAB — PREPARE RBC (CROSSMATCH)

## 2021-04-12 LAB — FOLATE: Folate: 7.4 ng/mL (ref >5.9)

## 2021-04-12 LAB — COMPREHENSIVE METABOLIC PANEL
ALT: 10 U/L (ref 0–44)
AST: 15 U/L (ref 15–41)
Albumin: 3.1 g/dL — ABNORMAL LOW (ref 3.5–5.0)
Alkaline Phosphatase: 105 U/L (ref 38–126)
Anion gap: 8 (ref 5–15)
BUN: 48 mg/dL — ABNORMAL HIGH (ref 8–23)
CO2: 23 mmol/L (ref 22–32)
Calcium: 8.6 mg/dL — ABNORMAL LOW (ref 8.9–10.3)
Chloride: 103 mmol/L (ref 98–111)
Creatinine, Ser: 2.93 mg/dL — ABNORMAL HIGH (ref 0.44–1.00)
GFR, Estimated: 16 mL/min — ABNORMAL LOW (ref >60)
Glucose, Bld: 114 mg/dL — ABNORMAL HIGH (ref 70–99)
Potassium: 5 mmol/L (ref 3.5–5.1)
Sodium: 134 mmol/L — ABNORMAL LOW (ref 135–145)
Total Bilirubin: 0.5 mg/dL (ref 0.3–1.2)
Total Protein: 7.4 g/dL (ref 6.5–8.1)

## 2021-04-12 LAB — ABO/RH: ABO/RH(D): O POS

## 2021-04-12 LAB — VITAMIN B12: Vitamin B-12: 2761 pg/mL — ABNORMAL HIGH (ref 180–914)

## 2021-04-12 LAB — FERRITIN: Ferritin: 196 ng/mL (ref 11–307)

## 2021-04-12 NOTE — Progress Notes (Signed)
CONSULT NOTE  Patient Care Team: Marilynne Halsted, MD as PCP - General (Internal Medicine)  CHIEF COMPLAINTS/PURPOSE OF CONSULTATION: Anemia  HISTORY OF PRESENTING ILLNESS:  Alexandria Moody 75 y.o. female is here because of anemia.  Hemoglobins appear to have been stable in the 10 range for the past few months and has dropped over the past week.   Alexandria Moody is a 75 year old female with past medical history significant for chronic kidney disease stage IV, prior history of ANCA vasculitis, diabetes type 2, hypertension, PVD, hypothyroidism, GERD who was referred to hematology for anemia.  She evaluated by Dr. Posey Pronto (endocrinology) on 04/05/2021 for joint pain and found to have a large drop in her hemoglobin.  She had been seen 1 day prior by Dr. Holley Raring with a reported hemoglobin of 10.3.  She just moved here from [facility].  She has previously received blood transfusions, iron and Epogen.  She is attempting to establish care within our system.  She endorses shortness of breath, exertional chest pain and dizziness especially with standing.  She denies any active bleeding.  Endorses feeling "very sick".  States she has been anemic for years secondary to her kidneys.  Her prior nephrologist would give her blood and iron if her hemoglobin dropped below 10.  She also believes she was given Procrit in the past.  She denies any fevers, sweats, headaches, changes in her vision, runny nose, sore throat, cough, I speak a, vomiting, diarrhea, urinary symptoms bone or joint pain, skin changes, numbness, restless leg and balance or coordination problems.  Her diet is good.  She eats meat regularly.  She eats vegetables and greens.  Her weight has been stable.  She is unable to tolerate oral iron due to GI upset.  She receives vitamin B12 injections through PCP from neuropathy.   States that she has had a colonoscopy/EGD in the past but is due for one.  She denies any rectal bleeding.  Has  had an occasional nosebleed secondary to sinus issues.  MEDICAL HISTORY:  Past Medical History:  Diagnosis Date  . Diabetes mellitus without complication ([place])   . Hypertension     SURGICAL HISTORY: Past Surgical History:  Procedure Laterality Date  . CATARACT EXTRACTION Left 2014  . EYE SURGERY Left 07/22/2020   Dr. Towanda Octave, [facility]    SOCIAL HISTORY: Social History   Socioeconomic History  . Marital status: Widowed    Spouse name: Not on file  . Number of children: Not on file  . Years of education: Not on file  . Highest education level: Not on file  Occupational History  . Not on file  Tobacco Use  . Smoking status: Never Smoker  . Smokeless tobacco: Never Used  Substance and Sexual Activity  . Alcohol use: Not on file  . Drug use: Not on file  . Sexual activity: Not on file  Other Topics Concern  . Not on file  Social History Narrative  . Not on file   Social Determinants of Health   Financial Resource Strain: Not on file  Food Insecurity: Not on file  Transportation Needs: Not on file  Physical Activity: Not on file  Stress: Not on file  Social Connections: Not on file  Intimate Partner Violence: Not on file    FAMILY HISTORY: No family history on file.  ALLERGIES:  is allergic to other.  MEDICATIONS:  Current Outpatient Medications  Medication Sig Dispense Refill  . acetaminophen (TYLENOL) 325 MG  tablet Take by mouth.    Marland Kitchen albuterol (VENTOLIN HFA) 108 (90 Base) MCG/ACT inhaler Inhale into the lungs.    Marland Kitchen amLODipine (NORVASC) 5 MG tablet Take 1 tablet by mouth 2 (two) times daily.    Marland Kitchen aspirin 81 MG EC tablet Take by mouth.    . azaTHIOprine (IMURAN) 50 MG tablet Take 1 tablet by mouth 2 (two) times daily.    . calcitRIOL (ROCALTROL) 0.25 MCG capsule Take by mouth.    . Cholecalciferol 125 MCG (5000 UT) capsule Take by mouth.    . citalopram (CELEXA) 20 MG tablet Take by mouth.    . cyanocobalamin (,VITAMIN B-12,) 1000 MCG/ML  injection Inject into the muscle.    . hydrALAZINE (APRESOLINE) 50 MG tablet Take by mouth.    . insulin glargine, 1 Unit Dial, (TOUJEO SOLOSTAR) 300 UNIT/ML Solostar Pen     . isosorbide mononitrate (IMDUR) 120 MG 24 hr tablet Take by mouth.    . levothyroxine (SYNTHROID) 75 MCG tablet Take by mouth.    . linagliptin (TRADJENTA) 5 MG TABS tablet 1 (one) time each day    . losartan (COZAAR) 25 MG tablet Take by mouth.    . Omega-3 Fatty Acids (FISH OIL) 1000 MG CAPS Take by mouth.    Marland Kitchen omeprazole (PRILOSEC) 20 MG capsule Take 1 capsule by mouth daily.    . ranolazine (RANEXA) 500 MG 12 hr tablet Take by mouth.    . rosuvastatin (CRESTOR) 20 MG tablet Take by mouth.    . torsemide (DEMADEX) 20 MG tablet Take by mouth.    . Bromfenac Sodium 0.07 % SOLN Place 1 drop into the left eye every morning. (Patient not taking: No sig reported) 5 mL 3  . furosemide (LASIX) 20 MG tablet Take by mouth. (Patient not taking: Reported on 04/12/2021)    . nitroGLYCERIN (NITROSTAT) 0.4 MG SL tablet PLACE 1 TABLET UNDER THE TONGUE EVERY 5 MINUTES AS NEEDED FOR CHEST PAIN. MAY TAKE UP TO 3 DOSES (Patient not taking: Reported on 04/12/2021)    . ofloxacin (OCUFLOX) 0.3 % ophthalmic solution Place 1 drop into the right eye 4 (four) times daily. (Patient not taking: No sig reported) 5 mL 0  . prednisoLONE acetate (PRED FORTE) 1 % ophthalmic suspension Place 1 drop into the right eye 4 (four) times daily. (Patient not taking: No sig reported) 10 mL 0  . temazepam (RESTORIL) 15 MG capsule Take by mouth. (Patient not taking: Reported on 04/12/2021)    . Tiotropium Bromide-Olodaterol 2.5-2.5 MCG/ACT AERS Inhale into the lungs. (Patient not taking: Reported on 04/12/2021)     No current facility-administered medications for this visit.    REVIEW OF SYSTEMS:   Review of Systems  Constitutional: Positive for malaise/fatigue. Negative for chills, fever and weight loss.  HENT: Positive for nosebleeds. Negative for congestion,  ear pain and tinnitus.   Eyes: Negative.  Negative for blurred vision and double vision.  Respiratory: Positive for shortness of breath. Negative for cough and sputum production.   Cardiovascular: Negative.  Negative for chest pain, palpitations and leg swelling.  Gastrointestinal: Negative.  Negative for abdominal pain, constipation, diarrhea, nausea and vomiting.  Genitourinary: Negative for dysuria, frequency and urgency.  Musculoskeletal: Negative for back pain and falls.  Skin: Negative.  Negative for rash.  Neurological: Positive for dizziness and weakness. Negative for headaches.  Endo/Heme/Allergies: Negative.  Does not bruise/bleed easily.  Psychiatric/Behavioral: Negative for depression. The patient is nervous/anxious. The patient does not have insomnia.  PHYSICAL EXAMINATION: ECOG PERFORMANCE STATUS: 1 - Symptomatic but completely ambulatory  Vitals:   04/12/21 1425  BP: (!) 129/47  Pulse: (!) 51  Resp: 18  Temp: (!) 97.2 F (36.2 C)  SpO2: 100%   Filed Weights   04/12/21 1425  Weight: 149 lb 14.6 oz (68 kg)    Physical Exam Constitutional:      Appearance: Normal appearance.  HENT:     Head: Normocephalic and atraumatic.  Eyes:     Pupils: Pupils are equal, round, and reactive to light.  Cardiovascular:     Rate and Rhythm: Normal rate and regular rhythm.     Heart sounds: Normal heart sounds. No murmur heard.   Pulmonary:     Effort: Pulmonary effort is normal.     Breath sounds: Normal breath sounds. No wheezing.  Abdominal:     General: Bowel sounds are normal. There is no distension.     Palpations: Abdomen is soft.     Tenderness: There is no abdominal tenderness.  Musculoskeletal:        General: Normal range of motion.     Cervical back: Normal range of motion.  Skin:    General: Skin is warm and dry.     Findings: No rash.  Neurological:     Mental Status: She is alert and oriented to person, place, and time.  Psychiatric:         Judgment: Judgment normal.     LABORATORY DATA:  I have reviewed the data as listed Recent Results (from the past 2160 hour(s))  Folate     Status: None   Collection Time: 04/12/21  3:23 PM  Result Value Ref Range   Folate 7.4 >5.9 ng/mL    Comment: Performed at [facility], [address] [place], [place] [address]  Vitamin B12     Status: Abnormal   Collection Time: 04/12/21  3:23 PM  Result Value Ref Range   Vitamin B-12 2,761 (H) 180 - 914 pg/mL    Comment: (NOTE) This assay is not validated for testing neonatal or myeloproliferative syndrome specimens for Vitamin B12 levels. Performed at [facility], [place] [address], [place], [place] [address]   Ferritin     Status: None   Collection Time: 04/12/21  3:23 PM  Result Value Ref Range   Ferritin 196 11 - 307 ng/mL    Comment: Performed at [facility], [address] [place], [place] [address]  Iron and TIBC     Status: Abnormal   Collection Time: 04/12/21  3:23 PM  Result Value Ref Range   Iron 46 28 - 170 ug/dL   TIBC 220 (L) 250 - 450 ug/dL   Saturation Ratios 21 10.4 - 31.8 %   UIBC 174 ug/dL    Comment: Performed at [facility], [address] [place], [place] [address]  Comprehensive metabolic panel     Status: Abnormal   Collection Time: 04/12/21  3:23 PM  Result Value Ref Range   Sodium 134 (L) 135 - 145 mmol/L   Potassium 5.0 3.5 - 5.1 mmol/L   Chloride 103 98 - 111 mmol/L   CO2 23 22 - 32 mmol/L   Glucose, Bld 114 (H) 70 - 99 mg/dL    Comment: Glucose reference range applies only to samples taken after fasting for at least 8 hours.   BUN 48 (H) 8 - 23 mg/dL   Creatinine, Ser 2.93 (H) 0.44 - 1.00 mg/dL  Calcium 8.6 (L) 8.9 - 10.3 mg/dL   Total Protein 7.4 6.5 - 8.1 g/dL   Albumin 3.1 (L) 3.5 - 5.0 g/dL   AST 15 15 - 41 U/L   ALT 10 0 - 44 U/L   Alkaline Phosphatase 105 38 - 126 U/L   Total Bilirubin 0.5 0.3 - 1.2 mg/dL   GFR, Estimated 16 (L) >60 mL/min     Comment: (NOTE) Calculated using the CKD-EPI Creatinine Equation (2021)    Anion gap 8 5 - 15    Comment: Performed at [facility], [address] [place], [place] [address]  CBC with Differential/Platelet     Status: Abnormal   Collection Time: 04/12/21  3:23 PM  Result Value Ref Range   WBC 5.8 4.0 - 10.5 K/uL   RBC 2.28 (L) 3.87 - 5.11 MIL/uL   Hemoglobin 7.8 (L) 12.0 - 15.0 g/dL   HCT 23.5 (L) 36.0 - 46.0 %   MCV 103.1 (H) 80.0 - 100.0 fL   MCH 34.2 (H) 26.0 - 34.0 pg   MCHC 33.2 30.0 - 36.0 g/dL   RDW 19.9 (H) 11.5 - 15.5 %   Platelets 499 (H) 150 - 400 K/uL   nRBC 0.0 0.0 - 0.2 %   Neutrophils Relative % 77 %   Neutro Abs 4.5 1.7 - 7.7 K/uL   Lymphocytes Relative 14 %   Lymphs Abs 0.8 0.7 - 4.0 K/uL   Monocytes Relative 8 %   Monocytes Absolute 0.4 0.1 - 1.0 K/uL   Eosinophils Relative 0 %   Eosinophils Absolute 0.0 0.0 - 0.5 K/uL   Basophils Relative 0 %   Basophils Absolute 0.0 0.0 - 0.1 K/uL   Immature Granulocytes 1 %   Abs Immature Granulocytes 0.03 0.00 - 0.07 K/uL    Comment: Performed at [facility], [address] [place], [place] [address]  Prepare RBC (crossmatch)     Status: None   Collection Time: 04/12/21  3:23 PM  Result Value Ref Range   Order Confirmation      ORDER PROCESSED BY BLOOD BANK Performed at [facility], [address], [place], [place] [address]   Type and screen     Status: None   Collection Time: 04/12/21  3:24 PM  Result Value Ref Range   ABO/RH(D) O POS    Antibody Screen NEG    Sample Expiration 04/15/2021,2359    Unit Number [id number]    Blood Component Type RBC LR PHER1    Unit division 00    Status of Unit ISSUED,FINAL    Transfusion Status OK TO TRANSFUSE    Crossmatch Result      Compatible Performed at [facility], [address] [place], [place] [address]   BPAM RBC     Status: None   Collection Time: 04/12/21  3:24 PM  Result Value Ref Range   ISSUE DATE /  TIME [id number]    Blood Product Unit Number [id number]    PRODUCT CODE [id number]    Unit Type and Rh 5100    Blood Product Expiration Date [id number]   ABO/Rh     Status: None   Collection Time: 04/12/21  3:32 PM  Result Value Ref Range   ABO/RH(D)      O POS Performed at [facility], [address] [place], [place] [address]     RADIOGRAPHIC STUDIES: I have personally reviewed the radiological images as listed and agreed with the findings in  the report. OCT, Retina - OU - Both Eyes  Result Date: 03/17/2021 Right Eye Quality was good. Scan locations included subfoveal. Central Foveal Thickness: 349. Progression has improved. Left Eye Quality was good. Scan locations included subfoveal. Central Foveal Thickness: 308. Progression has been stable. Notes OD with minor intraretinal fluid, status post vitrectomy membrane peel 1 week previous, much less thickening, much less outer retinal disturbance   ASSESSMENT & PLAN:   Plan: 1.   Labs today:  CBC, CMP, vitamin B12, iron, ferritin, copper, folate 2.   Macrocytic anemia  Secondary to CKD stage IV and iron deficiency  Recheck labs today given dramatic drop in hemoglobin to make sure there is no lab error.  Labs from 04/12/2021 show hemoglobin 7.8, hematocrit 23.5 and MCV 100.1.  Her differential is normal.  Ferritin is 196, folate 7.4.  Iron saturation 21%.  Vitamin B12 level 2761.  CMP shows creatinine 2.93, BUN 48, sodium level 134 and calcium 8.6.  GFR 16.  Given she is symptomatic with exertional chest pain and shortness of breath with dizziness, recommend 1 unit of packed red blood cells as soon as possible.  Denies any bleeding except for minor nosebleed several days ago.   Once we stabilize her hemoglobin, anticipate biweekly Retacrit and possible IV Venofer.  3.  Iron deficiency anemia  Admits to previous iron infusions while living in [place], [place].  Unable to tolerate oral iron d/t GI  upset.  Recheck labs today-ferritin is 196 iron saturation 29%  Given stage IV CKD, recommend a dose of IV iron if iron saturation remains below 30%  Disposition: Labs today 1 unit packed red blood cells as soon as we can accommodate her.  She is scheduled for blood transfusion tomorrow 04/13/2021 RTC in 1 week to evaluate labs and possible Retacrit/IV Venofer and MD assessment.   No problem-specific Assessment & Plan notes found for this encounter.  All questions were answered. The patient knows to call the clinic with any problems, questions or concerns. I spent 25 minutes counseling the patient face to face. The total time spent in the appointment was 25 minutes and more than 50% was on counseling.   Greater than 50% was spent in counseling and coordination of care with this patient including but not limited to discussion of the relevant topics above (See A&P) including, but not limited to diagnosis and management of acute and chronic medical conditions.    Jacquelin Hawking, NP 04/15/21 12:29 PM

## 2021-04-13 ENCOUNTER — Inpatient Hospital Stay: Payer: Medicare PPO

## 2021-04-13 DIAGNOSIS — N184 Chronic kidney disease, stage 4 (severe): Secondary | ICD-10-CM

## 2021-04-13 MED ORDER — SODIUM CHLORIDE 0.9% IV SOLUTION
250.0000 mL | Freq: Once | INTRAVENOUS | Status: AC
  Administered 2021-04-13: 250 mL via INTRAVENOUS
  Filled 2021-04-13: qty 250

## 2021-04-13 MED ORDER — DIPHENHYDRAMINE HCL 25 MG PO CAPS
25.0000 mg | ORAL_CAPSULE | Freq: Once | ORAL | Status: AC
  Administered 2021-04-13: 25 mg via ORAL
  Filled 2021-04-13: qty 1

## 2021-04-13 MED ORDER — ACETAMINOPHEN 325 MG PO TABS
650.0000 mg | ORAL_TABLET | Freq: Once | ORAL | Status: AC
  Administered 2021-04-13: 650 mg via ORAL
  Filled 2021-04-13: qty 2

## 2021-04-14 LAB — TYPE AND SCREEN
ABO/RH(D): O POS
Antibody Screen: NEGATIVE
Unit division: 0

## 2021-04-14 LAB — BPAM RBC
Blood Product Expiration Date: 202205232359
ISSUE DATE / TIME: 202204271437
Unit Type and Rh: 5100

## 2021-04-15 ENCOUNTER — Other Ambulatory Visit: Payer: Self-pay

## 2021-04-15 DIAGNOSIS — D631 Anemia in chronic kidney disease: Secondary | ICD-10-CM

## 2021-04-15 DIAGNOSIS — N184 Chronic kidney disease, stage 4 (severe): Secondary | ICD-10-CM

## 2021-04-15 DIAGNOSIS — D539 Nutritional anemia, unspecified: Secondary | ICD-10-CM

## 2021-04-15 LAB — COPPER, SERUM: Copper: 109 ug/dL (ref 80–158)

## 2021-04-19 ENCOUNTER — Other Ambulatory Visit: Payer: Self-pay

## 2021-04-19 ENCOUNTER — Inpatient Hospital Stay: Payer: Medicare PPO | Attending: Nurse Practitioner

## 2021-04-19 DIAGNOSIS — I7789 Other specified disorders of arteries and arterioles: Secondary | ICD-10-CM | POA: Diagnosis not present

## 2021-04-19 DIAGNOSIS — I129 Hypertensive chronic kidney disease with stage 1 through stage 4 chronic kidney disease, or unspecified chronic kidney disease: Secondary | ICD-10-CM | POA: Diagnosis not present

## 2021-04-19 DIAGNOSIS — E875 Hyperkalemia: Secondary | ICD-10-CM | POA: Insufficient documentation

## 2021-04-19 DIAGNOSIS — E039 Hypothyroidism, unspecified: Secondary | ICD-10-CM | POA: Diagnosis not present

## 2021-04-19 DIAGNOSIS — D539 Nutritional anemia, unspecified: Secondary | ICD-10-CM

## 2021-04-19 DIAGNOSIS — D631 Anemia in chronic kidney disease: Secondary | ICD-10-CM

## 2021-04-19 DIAGNOSIS — D709 Neutropenia, unspecified: Secondary | ICD-10-CM | POA: Diagnosis not present

## 2021-04-19 DIAGNOSIS — E1122 Type 2 diabetes mellitus with diabetic chronic kidney disease: Secondary | ICD-10-CM | POA: Diagnosis not present

## 2021-04-19 DIAGNOSIS — N184 Chronic kidney disease, stage 4 (severe): Secondary | ICD-10-CM | POA: Insufficient documentation

## 2021-04-19 DIAGNOSIS — I70212 Atherosclerosis of native arteries of extremities with intermittent claudication, left leg: Secondary | ICD-10-CM

## 2021-04-19 LAB — FERRITIN: Ferritin: 200 ng/mL (ref 11–307)

## 2021-04-19 LAB — COMPREHENSIVE METABOLIC PANEL
ALT: 9 U/L (ref 0–44)
AST: 16 U/L (ref 15–41)
Albumin: 3 g/dL — ABNORMAL LOW (ref 3.5–5.0)
Alkaline Phosphatase: 96 U/L (ref 38–126)
Anion gap: 7 (ref 5–15)
BUN: 50 mg/dL — ABNORMAL HIGH (ref 8–23)
CO2: 23 mmol/L (ref 22–32)
Calcium: 8.8 mg/dL — ABNORMAL LOW (ref 8.9–10.3)
Chloride: 105 mmol/L (ref 98–111)
Creatinine, Ser: 3.08 mg/dL — ABNORMAL HIGH (ref 0.44–1.00)
GFR, Estimated: 15 mL/min — ABNORMAL LOW (ref >60)
Glucose, Bld: 114 mg/dL — ABNORMAL HIGH (ref 70–99)
Potassium: 5.5 mmol/L — ABNORMAL HIGH (ref 3.5–5.1)
Sodium: 135 mmol/L (ref 135–145)
Total Bilirubin: 0.8 mg/dL (ref 0.3–1.2)
Total Protein: 6.8 g/dL (ref 6.5–8.1)

## 2021-04-19 LAB — CBC WITH DIFFERENTIAL/PLATELET
Abs Immature Granulocytes: 0.02 10*3/uL (ref 0.00–0.07)
Basophils Absolute: 0 10*3/uL (ref 0.0–0.1)
Basophils Relative: 0 %
Eosinophils Absolute: 0 10*3/uL (ref 0.0–0.5)
Eosinophils Relative: 0 %
HCT: 25.3 % — ABNORMAL LOW (ref 36.0–46.0)
Hemoglobin: 8.2 g/dL — ABNORMAL LOW (ref 12.0–15.0)
Immature Granulocytes: 0 %
Lymphocytes Relative: 14 %
Lymphs Abs: 0.7 10*3/uL (ref 0.7–4.0)
MCH: 32 pg (ref 26.0–34.0)
MCHC: 32.4 g/dL (ref 30.0–36.0)
MCV: 98.8 fL (ref 80.0–100.0)
Monocytes Absolute: 0.2 10*3/uL (ref 0.1–1.0)
Monocytes Relative: 4 %
Neutro Abs: 4 10*3/uL (ref 1.7–7.7)
Neutrophils Relative %: 82 %
Platelets: 356 10*3/uL (ref 150–400)
RBC: 2.56 MIL/uL — ABNORMAL LOW (ref 3.87–5.11)
RDW: 20.2 % — ABNORMAL HIGH (ref 11.5–15.5)
WBC: 4.9 10*3/uL (ref 4.0–10.5)
nRBC: 0 % (ref 0.0–0.2)

## 2021-04-20 ENCOUNTER — Inpatient Hospital Stay (HOSPITAL_BASED_OUTPATIENT_CLINIC_OR_DEPARTMENT_OTHER): Payer: Medicare PPO | Admitting: Oncology

## 2021-04-20 ENCOUNTER — Inpatient Hospital Stay: Payer: Medicare PPO

## 2021-04-20 ENCOUNTER — Other Ambulatory Visit: Payer: Self-pay

## 2021-04-20 ENCOUNTER — Encounter: Payer: Self-pay | Admitting: Oncology

## 2021-04-20 VITALS — BP 122/63 | HR 57 | Temp 97.2°F | Resp 14 | Wt 151.9 lb

## 2021-04-20 DIAGNOSIS — D631 Anemia in chronic kidney disease: Secondary | ICD-10-CM

## 2021-04-20 DIAGNOSIS — N184 Chronic kidney disease, stage 4 (severe): Secondary | ICD-10-CM | POA: Diagnosis not present

## 2021-04-20 MED ORDER — EPOETIN ALFA-EPBX 10000 UNIT/ML IJ SOLN
10000.0000 [IU] | Freq: Once | INTRAMUSCULAR | Status: AC
  Administered 2021-04-20: 10000 [IU] via SUBCUTANEOUS

## 2021-04-20 MED ORDER — LACTULOSE 10 GM/15ML PO SOLN: 20.0000 g | Freq: Once | ORAL | 0 refills | Status: AC

## 2021-04-20 NOTE — Progress Notes (Signed)
Pt states she is short of breath and weak

## 2021-04-20 NOTE — Progress Notes (Signed)
CONSULT NOTE  Patient Care Team: Marilynne Halsted, MD as PCP - General (Internal Medicine)  CHIEF COMPLAINTS/PURPOSE OF CONSULTATION: Anemia  HISTORY OF PRESENTING ILLNESS:  Alexandria Moody 75 y.o. female is here because of anemia.  Hemoglobins appear to have been stable in the 10 range for the past few months and has dropped over the past week.   Alexandria Moody is a 75 year old female with past medical history significant for chronic kidney disease stage IV, prior history of ANCA vasculitis, diabetes type 2, hypertension, PVD, hypothyroidism, GERD who was referred to hematology for anemia.  She evaluated by Dr. Posey Pronto (endocrinology) on 04/05/2021 for joint pain and found to have a large drop in her hemoglobin.  She had been seen 1 day prior by Dr. Holley Raring with a reported hemoglobin of 10.3.  She just moved here from [facility].  She has previously received blood transfusions, iron and Epogen.  She is attempting to establish care within our system.  She endorses shortness of breath, exertional chest pain and dizziness especially with standing.  She denies any active bleeding.  Endorses feeling "very sick".  States she has been anemic for years secondary to her kidneys.  Her prior nephrologist would give her blood and iron if her hemoglobin dropped below 10.  She also believes she was given Procrit in the past.  She denies any fevers, sweats, headaches, changes in her vision, runny nose, sore throat, cough, I speak a, vomiting, diarrhea, urinary symptoms bone or joint pain, skin changes, numbness, restless leg and balance or coordination problems.  Her diet is good.  She eats meat regularly.  She eats vegetables and greens.  Her weight has been stable.  She is unable to tolerate oral iron due to GI upset.  She receives vitamin B12 injections through PCP from neuropathy.   States that she has had a colonoscopy/EGD in the past but is due for one.  She denies any rectal bleeding.  Has  had an occasional nosebleed secondary to sinus issues.  In the interim, she has felt somewhat better. She is happy that her hemoglobin has increased "but it's still low."   She reports continued shortness of breath/fatigue.   MEDICAL HISTORY:  Past Medical History:  Diagnosis Date  . Diabetes mellitus without complication ([place])   . Hypertension     SURGICAL HISTORY: Past Surgical History:  Procedure Laterality Date  . CATARACT EXTRACTION Left 2014  . EYE SURGERY Left 07/22/2020   Dr. Towanda Octave, Vit and Marta Lamas    SOCIAL HISTORY: Social History   Socioeconomic History  . Marital status: Widowed    Spouse name: Not on file  . Number of children: Not on file  . Years of education: Not on file  . Highest education level: Not on file  Occupational History  . Not on file  Tobacco Use  . Smoking status: Never Smoker  . Smokeless tobacco: Never Used  Substance and Sexual Activity  . Alcohol use: Not on file  . Drug use: Not on file  . Sexual activity: Not on file  Other Topics Concern  . Not on file  Social History Narrative  . Not on file   Social Determinants of Health   Financial Resource Strain: Not on file  Food Insecurity: Not on file  Transportation Needs: Not on file  Physical Activity: Not on file  Stress: Not on file  Social Connections: Not on file  Intimate Partner Violence: Not on file    FAMILY  HISTORY: History reviewed. No pertinent family history.  ALLERGIES:  is allergic to other.  MEDICATIONS:  Current Outpatient Medications  Medication Sig Dispense Refill  . acetaminophen (TYLENOL) 325 MG tablet Take by mouth.    Marland Kitchen albuterol (VENTOLIN HFA) 108 (90 Base) MCG/ACT inhaler Inhale into the lungs.    Marland Kitchen amLODipine (NORVASC) 5 MG tablet Take 1 tablet by mouth 2 (two) times daily.    Marland Kitchen aspirin 81 MG EC tablet Take by mouth.    . azaTHIOprine (IMURAN) 50 MG tablet Take 1 tablet by mouth 2 (two) times daily.    . calcitRIOL (ROCALTROL) 0.25 MCG  capsule Take by mouth.    . Cholecalciferol 125 MCG (5000 UT) capsule Take by mouth.    . citalopram (CELEXA) 20 MG tablet Take by mouth.    . cyanocobalamin (,VITAMIN B-12,) 1000 MCG/ML injection Inject into the muscle.    . hydrALAZINE (APRESOLINE) 50 MG tablet Take by mouth.    . insulin glargine, 1 Unit Dial, (TOUJEO SOLOSTAR) 300 UNIT/ML Solostar Pen     . isosorbide mononitrate (IMDUR) 120 MG 24 hr tablet Take by mouth.    . levothyroxine (SYNTHROID) 75 MCG tablet Take by mouth.    . linagliptin (TRADJENTA) 5 MG TABS tablet 1 (one) time each day    . losartan (COZAAR) 25 MG tablet Take by mouth.    . Omega-3 Fatty Acids (FISH OIL) 1000 MG CAPS Take by mouth.    Marland Kitchen omeprazole (PRILOSEC) 20 MG capsule Take 1 capsule by mouth daily.    . ranolazine (RANEXA) 500 MG 12 hr tablet Take by mouth.    . rosuvastatin (CRESTOR) 20 MG tablet Take by mouth.    . temazepam (RESTORIL) 15 MG capsule Take by mouth.    . torsemide (DEMADEX) 20 MG tablet Take by mouth.    . Bromfenac Sodium 0.07 % SOLN Place 1 drop into the left eye every morning. (Patient not taking: No sig reported) 5 mL 3  . furosemide (LASIX) 20 MG tablet Take by mouth. (Patient not taking: No sig reported)    . nitroGLYCERIN (NITROSTAT) 0.4 MG SL tablet PLACE 1 TABLET UNDER THE TONGUE EVERY 5 MINUTES AS NEEDED FOR CHEST PAIN. MAY TAKE UP TO 3 DOSES (Patient not taking: No sig reported)    . ofloxacin (OCUFLOX) 0.3 % ophthalmic solution Place 1 drop into the right eye 4 (four) times daily. (Patient not taking: No sig reported) 5 mL 0  . prednisoLONE acetate (PRED FORTE) 1 % ophthalmic suspension Place 1 drop into the right eye 4 (four) times daily. (Patient not taking: No sig reported) 10 mL 0  . Tiotropium Bromide-Olodaterol 2.5-2.5 MCG/ACT AERS Inhale into the lungs. (Patient not taking: No sig reported)     No current facility-administered medications for this visit.    REVIEW OF SYSTEMS:   Review of Systems  Constitutional:  Positive for malaise/fatigue. Negative for chills, fever and weight loss.  HENT: Positive for nosebleeds. Negative for congestion, ear pain and tinnitus.   Eyes: Negative.  Negative for blurred vision and double vision.  Respiratory: Positive for shortness of breath. Negative for cough and sputum production.   Cardiovascular: Negative.  Negative for chest pain, palpitations and leg swelling.  Gastrointestinal: Negative.  Negative for abdominal pain, constipation, diarrhea, nausea and vomiting.  Genitourinary: Negative for dysuria, frequency and urgency.  Musculoskeletal: Negative for back pain and falls.  Skin: Negative.  Negative for rash.  Neurological: Positive for dizziness and weakness. Negative for headaches.  Endo/Heme/Allergies: Negative.  Does not bruise/bleed easily.  Psychiatric/Behavioral: Negative for depression. The patient is nervous/anxious. The patient does not have insomnia.     PHYSICAL EXAMINATION: ECOG PERFORMANCE STATUS: 1 - Symptomatic but completely ambulatory  Vitals:   04/20/21 1342  BP: 122/63  Pulse: (!) 57  Resp: 14  Temp: (!) 97.2 F (36.2 C)   Filed Weights   04/20/21 1342  Weight: 68.9 kg    Physical Exam Constitutional:      Appearance: Normal appearance.  HENT:     Head: Normocephalic and atraumatic.  Eyes:     Pupils: Pupils are equal, round, and reactive to light.  Cardiovascular:     Rate and Rhythm: Normal rate and regular rhythm.     Heart sounds: Normal heart sounds. No murmur heard.   Pulmonary:     Effort: Pulmonary effort is normal.     Breath sounds: Normal breath sounds. No wheezing.  Abdominal:     General: Bowel sounds are normal. There is no distension.     Palpations: Abdomen is soft.     Tenderness: There is no abdominal tenderness.  Musculoskeletal:        General: Normal range of motion.     Cervical back: Normal range of motion.  Skin:    General: Skin is warm and dry.     Findings: No rash.  Neurological:      Mental Status: She is alert and oriented to person, place, and time.  Psychiatric:        Judgment: Judgment normal.     LABORATORY DATA:  I have reviewed the data as listed Recent Results (from the past 2160 hour(s))  Folate     Status: None   Collection Time: 04/12/21  3:23 PM  Result Value Ref Range   Folate 7.4 >5.9 ng/mL    Comment: Performed at [facility] [address] [place], [place] [address]  Copper, serum     Status: None   Collection Time: 04/12/21  3:23 PM  Result Value Ref Range   Copper 109 80 - 158 ug/dL    Comment: (NOTE) This test was developed and its performance characteristics determined by [facility]. It has not been cleared or approved by the Food and Drug Administration.                                Detection Limit = 5 Performed At: [facility] [place], [place] [id number] Rush Farmer MD XL:[id number]   Vitamin B12     Status: Abnormal   Collection Time: 04/12/21  3:23 PM  Result Value Ref Range   Vitamin B-12 2,761 (H) 180 - 914 pg/mL    Comment: (NOTE) This assay is not validated for testing neonatal or myeloproliferative syndrome specimens for Vitamin B12 levels. Performed at [facility], [place] [address], [place], [place] [address]   Ferritin     Status: None   Collection Time: 04/12/21  3:23 PM  Result Value Ref Range   Ferritin 196 11 - 307 ng/mL    Comment: Performed at [facility], [place], [place] [address]  Iron and TIBC     Status: Abnormal   Collection Time: 04/12/21  3:23 PM  Result Value Ref Range   Iron 46 28 - 170 ug/dL   TIBC 220 (L) 250 - 450 ug/dL   Saturation Ratios 21 10.4 - 31.8 %  UIBC 174 ug/dL    Comment: Performed at [facility], [address] [place],  [place] [address]  Comprehensive metabolic panel     Status: Abnormal   Collection Time: 04/12/21  3:23 PM  Result Value Ref Range   Sodium 134 (L) 135 - 145 mmol/L   Potassium  5.0 3.5 - 5.1 mmol/L   Chloride 103 98 - 111 mmol/L   CO2 23 22 - 32 mmol/L   Glucose, Bld 114 (H) 70 - 99 mg/dL    Comment: Glucose reference range applies only to samples taken after fasting for at least 8 hours.   BUN 48 (H) 8 - 23 mg/dL   Creatinine, Ser 2.93 (H) 0.44 - 1.00 mg/dL   Calcium 8.6 (L) 8.9 - 10.3 mg/dL   Total Protein 7.4 6.5 - 8.1 g/dL   Albumin 3.1 (L) 3.5 - 5.0 g/dL   AST 15 15 - 41 U/L   ALT 10 0 - 44 U/L   Alkaline Phosphatase 105 38 - 126 U/L   Total Bilirubin 0.5 0.3 - 1.2 mg/dL   GFR, Estimated 16 (L) >60 mL/min    Comment: (NOTE) Calculated using the CKD-EPI Creatinine Equation (2021)    Anion gap 8 5 - 15    Comment: Performed at [facility], [address] [place], [place] [address]  CBC with Differential/Platelet     Status: Abnormal   Collection Time: 04/12/21  3:23 PM  Result Value Ref Range   WBC 5.8 4.0 - 10.5 K/uL   RBC 2.28 (L) 3.87 - 5.11 MIL/uL   Hemoglobin 7.8 (L) 12.0 - 15.0 g/dL   HCT 23.5 (L) 36.0 - 46.0 %   MCV 103.1 (H) 80.0 - 100.0 fL   MCH 34.2 (H) 26.0 - 34.0 pg   MCHC 33.2 30.0 - 36.0 g/dL   RDW 19.9 (H) 11.5 - 15.5 %   Platelets 499 (H) 150 - 400 K/uL   nRBC 0.0 0.0 - 0.2 %   Neutrophils Relative % 77 %   Neutro Abs 4.5 1.7 - 7.7 K/uL   Lymphocytes Relative 14 %   Lymphs Abs 0.8 0.7 - 4.0 K/uL   Monocytes Relative 8 %   Monocytes Absolute 0.4 0.1 - 1.0 K/uL   Eosinophils Relative 0 %   Eosinophils Absolute 0.0 0.0 - 0.5 K/uL   Basophils Relative 0 %   Basophils Absolute 0.0 0.0 - 0.1 K/uL   Immature Granulocytes 1 %   Abs Immature Granulocytes 0.03 0.00 - 0.07 K/uL    Comment: Performed at [facility], [address], [place], [place] [address]  Prepare RBC (crossmatch)     Status: None   Collection Time: 04/12/21  3:23 PM  Result Value Ref Range   Order Confirmation      ORDER PROCESSED BY BLOOD BANK Performed at [facility], [address] [place], [place] [address]   Type  and screen     Status: None   Collection Time: 04/12/21  3:24 PM  Result Value Ref Range   ABO/RH(D) O POS    Antibody Screen NEG    Sample Expiration 04/15/2021,2359    Unit Number [id number]    Blood Component Type RBC LR PHER1    Unit division 00    Status of Unit ISSUED,FINAL    Transfusion Status OK TO TRANSFUSE    Crossmatch Result      Compatible Performed at [facility], [address] [place],  [address]  BPAM RBC     Status: None   Collection Time: 04/12/21  3:24 PM  Result Value Ref Range   ISSUE DATE / TIME [id number]    Blood Product Unit Number [id number]    PRODUCT CODE [id number]    Unit Type and Rh 5100    Blood Product Expiration Date [id number]   ABO/Rh     Status: None   Collection Time: 04/12/21  3:32 PM  Result Value Ref Range   ABO/RH(D)      O POS Performed at [facility], [address] [place], [place] [address]   Ferritin     Status: None   Collection Time: 04/19/21 10:46 AM  Result Value Ref Range   Ferritin 200 11 - 307 ng/mL    Comment: Performed at [facility], [address] [place], [place] [address]  Comprehensive metabolic panel     Status: Abnormal   Collection Time: 04/19/21 10:46 AM  Result Value Ref Range   Sodium 135 135 - 145 mmol/L   Potassium 5.5 (H) 3.5 - 5.1 mmol/L   Chloride 105 98 - 111 mmol/L   CO2 23 22 - 32 mmol/L   Glucose, Bld 114 (H) 70 - 99 mg/dL    Comment: Glucose reference range applies only to samples taken after fasting for at least 8 hours.   BUN 50 (H) 8 - 23 mg/dL   Creatinine, Ser 3.08 (H) 0.44 - 1.00 mg/dL   Calcium 8.8 (L) 8.9 - 10.3 mg/dL   Total Protein 6.8 6.5 - 8.1 g/dL   Albumin 3.0 (L) 3.5 - 5.0 g/dL   AST 16 15 - 41 U/L   ALT 9 0 - 44 U/L   Alkaline Phosphatase 96 38 - 126 U/L   Total Bilirubin 0.8 0.3 - 1.2 mg/dL   GFR, Estimated 15 (L) >60 mL/min    Comment: (NOTE) Calculated using the CKD-EPI Creatinine Equation (2021)    Anion gap 7 5  - 15    Comment: Performed at [facility], [address] [place], [place] [address]  CBC with Differential     Status: Abnormal   Collection Time: 04/19/21 10:46 AM  Result Value Ref Range   WBC 4.9 4.0 - 10.5 K/uL   RBC 2.56 (L) 3.87 - 5.11 MIL/uL   Hemoglobin 8.2 (L) 12.0 - 15.0 g/dL   HCT 25.3 (L) 36.0 - 46.0 %   MCV 98.8 80.0 - 100.0 fL   MCH 32.0 26.0 - 34.0 pg   MCHC 32.4 30.0 - 36.0 g/dL   RDW 20.2 (H) 11.5 - 15.5 %   Platelets 356 150 - 400 K/uL   nRBC 0.0 0.0 - 0.2 %   Neutrophils Relative % 82 %   Neutro Abs 4.0 1.7 - 7.7 K/uL   Lymphocytes Relative 14 %   Lymphs Abs 0.7 0.7 - 4.0 K/uL   Monocytes Relative 4 %   Monocytes Absolute 0.2 0.1 - 1.0 K/uL   Eosinophils Relative 0 %   Eosinophils Absolute 0.0 0.0 - 0.5 K/uL   Basophils Relative 0 %   Basophils Absolute 0.0 0.0 - 0.1 K/uL   Immature Granulocytes 0 %   Abs Immature Granulocytes 0.02 0.00 - 0.07 K/uL    Comment: Performed at [facility], [address] [place], [place] [address]    RADIOGRAPHIC STUDIES: I have personally reviewed the radiological images as listed and agreed with the findings in the report. No results found.  ASSESSMENT &  PLAN:   Plan: 1.   Labs today:  CBC, CMP, iron, ferritin. 2.  Anemia  Secondary to CKD stage IV and iron deficiency  She received 1 unit of packed red blood cells last week for hemoglobin of 7.8.  Labs from 04/19/2021 show hemoglobin of 8.2, hematocrit 25.3, ferritin 200.  Feels improved since blood transfusion.  Denies any bleeding except for minor nosebleed several days ago.   Proceed with 10,000 units Retacrit today.   She is followed by Dr. Holley Raring, but would like to be referred to a different nephrologist.  Referral sent to Dr. Juleen China in [place].   3.  Iron deficiency anemia  Admits to previous iron infusions while living in [place], [place].  Unable to tolerate oral iron d/t GI upset.  Recheck labs today-ferritin is  196 iron saturation 21%  Given stage IV CKD, recommend a dose of IV iron if iron saturation remains below 30%  Hemoglobin goal is 10.   See weekly in clinic with labs and MD assessment until HGB improved.   Labs from today show a hemoglobin of 8.2, ferritin 200.   Recommend 2 doses of IV Venofer to achieve iron saturation around 30%. 4. Hyperkalemia  Potassium is 5.5 today.  Prescription sent for lactulose.   Recheck potassium on Monday.  Disposition: Proceed with 10,000 Retacrit injection.  Plan to give 1 dose of IV Venofer on Monday 5/9 and 2nd dose on 5/12.  RTC in 1 week Retacrit/Labs (CBC with diff, CMP, Ferritin).    Greater than 50% was spent in counseling and coordination of care with this patient including but not limited to discussion of the relevant topics above (See A&P) including, but not limited to diagnosis and management of acute and chronic medical conditions.   Faythe Casa, NP 04/20/2021 3:59 PM

## 2021-04-22 ENCOUNTER — Other Ambulatory Visit: Payer: Self-pay

## 2021-04-25 ENCOUNTER — Inpatient Hospital Stay: Payer: Medicare PPO

## 2021-04-25 ENCOUNTER — Other Ambulatory Visit: Payer: Self-pay

## 2021-04-25 ENCOUNTER — Other Ambulatory Visit: Payer: Self-pay | Admitting: Nurse Practitioner

## 2021-04-25 VITALS — BP 138/55 | HR 60 | Resp 18

## 2021-04-25 DIAGNOSIS — N184 Chronic kidney disease, stage 4 (severe): Secondary | ICD-10-CM | POA: Diagnosis not present

## 2021-04-25 DIAGNOSIS — D631 Anemia in chronic kidney disease: Secondary | ICD-10-CM

## 2021-04-25 DIAGNOSIS — D509 Iron deficiency anemia, unspecified: Secondary | ICD-10-CM

## 2021-04-25 DIAGNOSIS — N189 Chronic kidney disease, unspecified: Secondary | ICD-10-CM

## 2021-04-25 LAB — CBC WITH DIFFERENTIAL/PLATELET
Abs Immature Granulocytes: 0.02 10*3/uL (ref 0.00–0.07)
Basophils Absolute: 0 10*3/uL (ref 0.0–0.1)
Basophils Relative: 1 %
Eosinophils Absolute: 0 10*3/uL (ref 0.0–0.5)
Eosinophils Relative: 0 %
HCT: 24.6 % — ABNORMAL LOW (ref 36.0–46.0)
Hemoglobin: 7.9 g/dL — ABNORMAL LOW (ref 12.0–15.0)
Immature Granulocytes: 1 %
Lymphocytes Relative: 18 %
Lymphs Abs: 0.6 10*3/uL — ABNORMAL LOW (ref 0.7–4.0)
MCH: 32.4 pg (ref 26.0–34.0)
MCHC: 32.1 g/dL (ref 30.0–36.0)
MCV: 100.8 fL — ABNORMAL HIGH (ref 80.0–100.0)
Monocytes Absolute: 0.2 10*3/uL (ref 0.1–1.0)
Monocytes Relative: 5 %
Neutro Abs: 2.7 10*3/uL (ref 1.7–7.7)
Neutrophils Relative %: 75 %
Platelets: 258 10*3/uL (ref 150–400)
RBC: 2.44 MIL/uL — ABNORMAL LOW (ref 3.87–5.11)
RDW: 20.7 % — ABNORMAL HIGH (ref 11.5–15.5)
WBC: 3.6 10*3/uL — ABNORMAL LOW (ref 4.0–10.5)
nRBC: 0 % (ref 0.0–0.2)

## 2021-04-25 LAB — COMPREHENSIVE METABOLIC PANEL
ALT: 10 U/L (ref 0–44)
AST: 20 U/L (ref 15–41)
Albumin: 3.4 g/dL — ABNORMAL LOW (ref 3.5–5.0)
Alkaline Phosphatase: 109 U/L (ref 38–126)
Anion gap: 10 (ref 5–15)
BUN: 51 mg/dL — ABNORMAL HIGH (ref 8–23)
CO2: 20 mmol/L — ABNORMAL LOW (ref 22–32)
Calcium: 8.7 mg/dL — ABNORMAL LOW (ref 8.9–10.3)
Chloride: 104 mmol/L (ref 98–111)
Creatinine, Ser: 3.32 mg/dL — ABNORMAL HIGH (ref 0.44–1.00)
GFR, Estimated: 14 mL/min — ABNORMAL LOW (ref >60)
Glucose, Bld: 188 mg/dL — ABNORMAL HIGH (ref 70–99)
Potassium: 5 mmol/L (ref 3.5–5.1)
Sodium: 134 mmol/L — ABNORMAL LOW (ref 135–145)
Total Bilirubin: 1.1 mg/dL (ref 0.3–1.2)
Total Protein: 6.9 g/dL (ref 6.5–8.1)

## 2021-04-25 MED ORDER — IRON SUCROSE 20 MG/ML IV SOLN
200.0000 mg | Freq: Once | INTRAVENOUS | Status: AC
Start: 2021-04-25 — End: 2021-04-25
  Administered 2021-04-25: 200 mg via INTRAVENOUS
  Filled 2021-04-25: qty 10

## 2021-04-25 MED ORDER — SODIUM CHLORIDE 0.9 % IV SOLN: 200.0000 mg | Freq: Once | INTRAVENOUS | Status: DC

## 2021-04-25 MED ORDER — SODIUM CHLORIDE 0.9 % IV SOLN
Freq: Once | INTRAVENOUS | Status: AC
Start: 2021-04-25 — End: 2021-04-25
  Filled 2021-04-25: qty 250

## 2021-04-25 MED ORDER — IRON SUCROSE 20 MG/ML IV SOLN: 200.0000 mg | Freq: Once | INTRAVENOUS | Status: AC

## 2021-04-25 NOTE — Patient Instructions (Signed)

## 2021-04-27 ENCOUNTER — Inpatient Hospital Stay: Payer: Medicare PPO

## 2021-04-27 ENCOUNTER — Other Ambulatory Visit: Payer: Self-pay

## 2021-04-28 ENCOUNTER — Inpatient Hospital Stay: Payer: Medicare PPO

## 2021-04-28 ENCOUNTER — Other Ambulatory Visit: Payer: Self-pay

## 2021-04-28 VITALS — BP 127/75 | HR 66 | Temp 96.0°F | Resp 20

## 2021-04-28 DIAGNOSIS — N189 Chronic kidney disease, unspecified: Secondary | ICD-10-CM

## 2021-04-28 DIAGNOSIS — D631 Anemia in chronic kidney disease: Secondary | ICD-10-CM

## 2021-04-28 DIAGNOSIS — N184 Chronic kidney disease, stage 4 (severe): Secondary | ICD-10-CM | POA: Diagnosis not present

## 2021-04-28 LAB — CBC WITH DIFFERENTIAL/PLATELET
Abs Immature Granulocytes: 0.02 10*3/uL (ref 0.00–0.07)
Basophils Absolute: 0 10*3/uL (ref 0.0–0.1)
Basophils Relative: 0 %
Eosinophils Absolute: 0 10*3/uL (ref 0.0–0.5)
Eosinophils Relative: 1 %
HCT: 23.6 % — ABNORMAL LOW (ref 36.0–46.0)
Hemoglobin: 7.8 g/dL — ABNORMAL LOW (ref 12.0–15.0)
Immature Granulocytes: 1 %
Lymphocytes Relative: 23 %
Lymphs Abs: 0.7 10*3/uL (ref 0.7–4.0)
MCH: 33.3 pg (ref 26.0–34.0)
MCHC: 33.1 g/dL (ref 30.0–36.0)
MCV: 100.9 fL — ABNORMAL HIGH (ref 80.0–100.0)
Monocytes Absolute: 0.2 10*3/uL (ref 0.1–1.0)
Monocytes Relative: 6 %
Neutro Abs: 2.3 10*3/uL (ref 1.7–7.7)
Neutrophils Relative %: 69 %
Platelets: 234 10*3/uL (ref 150–400)
RBC: 2.34 MIL/uL — ABNORMAL LOW (ref 3.87–5.11)
RDW: 21.2 % — ABNORMAL HIGH (ref 11.5–15.5)
WBC: 3.3 10*3/uL — ABNORMAL LOW (ref 4.0–10.5)
nRBC: 0 % (ref 0.0–0.2)

## 2021-04-28 LAB — COMPREHENSIVE METABOLIC PANEL
ALT: 12 U/L (ref 0–44)
AST: 18 U/L (ref 15–41)
Albumin: 3.3 g/dL — ABNORMAL LOW (ref 3.5–5.0)
Alkaline Phosphatase: 114 U/L (ref 38–126)
Anion gap: 9 (ref 5–15)
BUN: 56 mg/dL — ABNORMAL HIGH (ref 8–23)
CO2: 20 mmol/L — ABNORMAL LOW (ref 22–32)
Calcium: 8.4 mg/dL — ABNORMAL LOW (ref 8.9–10.3)
Chloride: 106 mmol/L (ref 98–111)
Creatinine, Ser: 3.01 mg/dL — ABNORMAL HIGH (ref 0.44–1.00)
GFR, Estimated: 16 mL/min — ABNORMAL LOW (ref >60)
Glucose, Bld: 101 mg/dL — ABNORMAL HIGH (ref 70–99)
Potassium: 4.9 mmol/L (ref 3.5–5.1)
Sodium: 135 mmol/L (ref 135–145)
Total Bilirubin: 0.8 mg/dL (ref 0.3–1.2)
Total Protein: 6.9 g/dL (ref 6.5–8.1)

## 2021-04-28 LAB — FERRITIN: Ferritin: 305 ng/mL (ref 11–307)

## 2021-04-28 LAB — IRON AND TIBC
Iron: 59 ug/dL (ref 28–170)
Saturation Ratios: 24 % (ref 10.4–31.8)
TIBC: 244 ug/dL — ABNORMAL LOW (ref 250–450)
UIBC: 185 ug/dL

## 2021-04-28 MED ORDER — EPOETIN ALFA-EPBX 10000 UNIT/ML IJ SOLN
10000.0000 [IU] | Freq: Once | INTRAMUSCULAR | Status: AC
  Administered 2021-04-28: 10000 [IU] via SUBCUTANEOUS

## 2021-05-03 ENCOUNTER — Inpatient Hospital Stay: Payer: Medicare PPO

## 2021-05-03 ENCOUNTER — Other Ambulatory Visit: Payer: Self-pay

## 2021-05-03 VITALS — BP 119/50 | HR 68 | Resp 20

## 2021-05-03 DIAGNOSIS — N184 Chronic kidney disease, stage 4 (severe): Secondary | ICD-10-CM | POA: Diagnosis not present

## 2021-05-03 DIAGNOSIS — D631 Anemia in chronic kidney disease: Secondary | ICD-10-CM

## 2021-05-03 DIAGNOSIS — N189 Chronic kidney disease, unspecified: Secondary | ICD-10-CM

## 2021-05-03 LAB — COMPREHENSIVE METABOLIC PANEL
ALT: 13 U/L (ref 0–44)
AST: 21 U/L (ref 15–41)
Albumin: 3.4 g/dL — ABNORMAL LOW (ref 3.5–5.0)
Alkaline Phosphatase: 99 U/L (ref 38–126)
Anion gap: 8 (ref 5–15)
BUN: 49 mg/dL — ABNORMAL HIGH (ref 8–23)
CO2: 21 mmol/L — ABNORMAL LOW (ref 22–32)
Calcium: 8.4 mg/dL — ABNORMAL LOW (ref 8.9–10.3)
Chloride: 108 mmol/L (ref 98–111)
Creatinine, Ser: 3.07 mg/dL — ABNORMAL HIGH (ref 0.44–1.00)
GFR, Estimated: 15 mL/min — ABNORMAL LOW (ref >60)
Glucose, Bld: 138 mg/dL — ABNORMAL HIGH (ref 70–99)
Potassium: 4.8 mmol/L (ref 3.5–5.1)
Sodium: 137 mmol/L (ref 135–145)
Total Bilirubin: 0.7 mg/dL (ref 0.3–1.2)
Total Protein: 6.8 g/dL (ref 6.5–8.1)

## 2021-05-03 LAB — CBC WITH DIFFERENTIAL/PLATELET
Abs Immature Granulocytes: 0.01 10*3/uL (ref 0.00–0.07)
Basophils Absolute: 0 10*3/uL (ref 0.0–0.1)
Basophils Relative: 1 %
Eosinophils Absolute: 0 10*3/uL (ref 0.0–0.5)
Eosinophils Relative: 0 %
HCT: 23.7 % — ABNORMAL LOW (ref 36.0–46.0)
Hemoglobin: 7.8 g/dL — ABNORMAL LOW (ref 12.0–15.0)
Immature Granulocytes: 0 %
Lymphocytes Relative: 22 %
Lymphs Abs: 0.6 10*3/uL — ABNORMAL LOW (ref 0.7–4.0)
MCH: 33.8 pg (ref 26.0–34.0)
MCHC: 32.9 g/dL (ref 30.0–36.0)
MCV: 102.6 fL — ABNORMAL HIGH (ref 80.0–100.0)
Monocytes Absolute: 0.2 10*3/uL (ref 0.1–1.0)
Monocytes Relative: 8 %
Neutro Abs: 2 10*3/uL (ref 1.7–7.7)
Neutrophils Relative %: 69 %
Platelets: 265 10*3/uL (ref 150–400)
RBC: 2.31 MIL/uL — ABNORMAL LOW (ref 3.87–5.11)
RDW: 21.8 % — ABNORMAL HIGH (ref 11.5–15.5)
WBC: 2.9 10*3/uL — ABNORMAL LOW (ref 4.0–10.5)
nRBC: 0.7 % — ABNORMAL HIGH (ref 0.0–0.2)

## 2021-05-03 MED ORDER — SODIUM CHLORIDE 0.9 % IV SOLN
Freq: Once | INTRAVENOUS | Status: AC
  Filled 2021-05-03: qty 250

## 2021-05-03 MED ORDER — IRON SUCROSE 20 MG/ML IV SOLN
200.0000 mg | Freq: Once | INTRAVENOUS | Status: AC
Start: 2021-05-03 — End: 2021-05-03
  Administered 2021-05-03: 200 mg via INTRAVENOUS
  Filled 2021-05-03: qty 10

## 2021-05-03 MED ORDER — SODIUM CHLORIDE 0.9 % IV SOLN: 200.0000 mg | Freq: Once | INTRAVENOUS | Status: DC

## 2021-05-03 NOTE — Patient Instructions (Signed)

## 2021-05-04 ENCOUNTER — Inpatient Hospital Stay: Payer: Medicare PPO

## 2021-05-04 ENCOUNTER — Other Ambulatory Visit: Payer: Self-pay

## 2021-05-05 ENCOUNTER — Inpatient Hospital Stay: Payer: Medicare PPO

## 2021-05-05 ENCOUNTER — Other Ambulatory Visit: Payer: Self-pay

## 2021-05-05 ENCOUNTER — Inpatient Hospital Stay: Payer: Medicare PPO | Admitting: Oncology

## 2021-05-05 ENCOUNTER — Encounter: Payer: Self-pay | Admitting: Oncology

## 2021-05-05 VITALS — BP 149/42 | HR 57 | Temp 97.0°F | Resp 18 | Wt 147.5 lb

## 2021-05-05 DIAGNOSIS — N184 Chronic kidney disease, stage 4 (severe): Secondary | ICD-10-CM

## 2021-05-05 DIAGNOSIS — D7589 Other specified diseases of blood and blood-forming organs: Secondary | ICD-10-CM | POA: Diagnosis not present

## 2021-05-05 DIAGNOSIS — D708 Other neutropenia: Secondary | ICD-10-CM

## 2021-05-05 DIAGNOSIS — N189 Chronic kidney disease, unspecified: Secondary | ICD-10-CM

## 2021-05-05 DIAGNOSIS — D631 Anemia in chronic kidney disease: Secondary | ICD-10-CM

## 2021-05-05 LAB — COMPREHENSIVE METABOLIC PANEL
ALT: 12 U/L (ref 0–44)
AST: 20 U/L (ref 15–41)
Albumin: 3.4 g/dL — ABNORMAL LOW (ref 3.5–5.0)
Alkaline Phosphatase: 108 U/L (ref 38–126)
Anion gap: 7 (ref 5–15)
BUN: 57 mg/dL — ABNORMAL HIGH (ref 8–23)
CO2: 20 mmol/L — ABNORMAL LOW (ref 22–32)
Calcium: 8.3 mg/dL — ABNORMAL LOW (ref 8.9–10.3)
Chloride: 108 mmol/L (ref 98–111)
Creatinine, Ser: 2.99 mg/dL — ABNORMAL HIGH (ref 0.44–1.00)
GFR, Estimated: 16 mL/min — ABNORMAL LOW (ref >60)
Glucose, Bld: 146 mg/dL — ABNORMAL HIGH (ref 70–99)
Potassium: 4.5 mmol/L (ref 3.5–5.1)
Sodium: 135 mmol/L (ref 135–145)
Total Bilirubin: 0.7 mg/dL (ref 0.3–1.2)
Total Protein: 6.7 g/dL (ref 6.5–8.1)

## 2021-05-05 LAB — RETIC PANEL
Immature Retic Fract: 21.3 % — ABNORMAL HIGH (ref 2.3–15.9)
RBC.: 2.24 MIL/uL — ABNORMAL LOW (ref 3.87–5.11)
Retic Count, Absolute: 48.8 10*3/uL (ref 19.0–186.0)
Retic Ct Pct: 2.2 % (ref 0.4–3.1)
Reticulocyte Hemoglobin: 40.7 pg (ref >27.9)

## 2021-05-05 LAB — CBC WITH DIFFERENTIAL/PLATELET
Abs Immature Granulocytes: 0.01 10*3/uL (ref 0.00–0.07)
Basophils Absolute: 0 10*3/uL (ref 0.0–0.1)
Basophils Relative: 0 %
Eosinophils Absolute: 0 10*3/uL (ref 0.0–0.5)
Eosinophils Relative: 0 %
HCT: 22.9 % — ABNORMAL LOW (ref 36.0–46.0)
Hemoglobin: 7.4 g/dL — ABNORMAL LOW (ref 12.0–15.0)
Immature Granulocytes: 0 %
Lymphocytes Relative: 29 %
Lymphs Abs: 0.8 10*3/uL (ref 0.7–4.0)
MCH: 33.3 pg (ref 26.0–34.0)
MCHC: 32.3 g/dL (ref 30.0–36.0)
MCV: 103.2 fL — ABNORMAL HIGH (ref 80.0–100.0)
Monocytes Absolute: 0.2 10*3/uL (ref 0.1–1.0)
Monocytes Relative: 8 %
Neutro Abs: 1.6 10*3/uL — ABNORMAL LOW (ref 1.7–7.7)
Neutrophils Relative %: 63 %
Platelets: 271 10*3/uL (ref 150–400)
RBC: 2.22 MIL/uL — ABNORMAL LOW (ref 3.87–5.11)
RDW: 22.3 % — ABNORMAL HIGH (ref 11.5–15.5)
WBC: 2.6 10*3/uL — ABNORMAL LOW (ref 4.0–10.5)
nRBC: 0 % (ref 0.0–0.2)

## 2021-05-05 MED ORDER — EPOETIN ALFA-EPBX 10000 UNIT/ML IJ SOLN
10000.0000 [IU] | Freq: Once | INTRAMUSCULAR | Status: AC
  Administered 2021-05-05: 10000 [IU] via SUBCUTANEOUS

## 2021-05-05 NOTE — Progress Notes (Signed)
Pt here for follow. Has " a lot of concerns" today regarding "what is going on with [her] body and will discuss with MD.

## 2021-05-06 LAB — KAPPA/LAMBDA LIGHT CHAINS
Kappa free light chain: 121.5 mg/L — ABNORMAL HIGH (ref 3.3–19.4)
Kappa, lambda light chain ratio: 1.64 (ref 0.26–1.65)
Lambda free light chains: 74 mg/L — ABNORMAL HIGH (ref 5.7–26.3)

## 2021-05-09 NOTE — Progress Notes (Signed)
FOLLOW UP NOTE  Patient Care Team: Ezequiel Kayser, MD as PCP - General (Internal Medicine)  CHIEF COMPLAINTS/PURPOSE OF CONSULTATION: Anemia  HISTORY OF PRESENTING ILLNESS:  Alexandria Moody 75 y.o. female presents for follow-up  PERTINENT HEMATOLOGY HISTORY Patient previously was seen by nurse practitioner Faythe Casa, patient switched care to me on 05/05/21 Extensive medical record review was performed by me  PMH  significant for chronic kidney disease stage IV, prior history of ANCA vasculitis, diabetes type 2, hypertension, PVD, hypothyroidism, GERD chronically secondary to chronic kidney disease.  04/05/2021 She evaluated by Dr. Posey Pronto on  for joint pain and found to have a large drop in her hemoglobin.  She had been seen 1 day prior by Dr. Holley Raring with a reported hemoglobin of 10.3.  She just moved to the area from [facility]  She has previously received blood transfusions, iron and Epogen.    Patient was seen by NP Faythe Casa on 04/12/2021.  Anemia was felt and the patient was started on replacement therapy.  04/20/2021 Retacrit 10,000 units 04/25/2021, IV Venofer 200 mg  Patient reports that appetite is fair.  Weight has been stable. She is not able to tolerate oral iron supplementation due to GI toxicities.  She receives monthly vitamin B12 injections through her PCP  Reports history of EGD/colonoscopy many years ago.  No recent EGD/colonoscopy.   MEDICAL HISTORY:  Past Medical History:  Diagnosis Date  . Diabetes mellitus without complication ([place])   . Hypertension     SURGICAL HISTORY: Past Surgical History:  Procedure Laterality Date  . CATARACT EXTRACTION Left 2014  . EYE SURGERY Left 07/22/2020   Dr. Towanda Octave, Vit and Marta Lamas    SOCIAL HISTORY: Social History   Socioeconomic History  . Marital status: Widowed    Spouse name: Not on file  . Number of children: Not on file  . Years of education: Not on file  . Highest education level: Not on  file  Occupational History  . Not on file  Tobacco Use  . Smoking status: Never Smoker  . Smokeless tobacco: Never Used  Substance and Sexual Activity  . Alcohol use: Not on file  . Drug use: Not on file  . Sexual activity: Not on file  Other Topics Concern  . Not on file  Social History Narrative  . Not on file   Social Determinants of Health   Financial Resource Strain: Not on file  Food Insecurity: Not on file  Transportation Needs: Not on file  Physical Activity: Not on file  Stress: Not on file  Social Connections: Not on file  Intimate Partner Violence: Not on file    FAMILY HISTORY: History reviewed. No pertinent family history.  ALLERGIES:  is allergic to other.  MEDICATIONS:  Current Outpatient Medications  Medication Sig Dispense Refill  . acetaminophen (TYLENOL) 325 MG tablet Take by mouth.    Marland Kitchen albuterol (VENTOLIN HFA) 108 (90 Base) MCG/ACT inhaler Inhale into the lungs.    Marland Kitchen amLODipine (NORVASC) 5 MG tablet Take 1 tablet by mouth 2 (two) times daily.    Marland Kitchen aspirin 81 MG EC tablet Take by mouth.    . azaTHIOprine (IMURAN) 50 MG tablet Take 1 tablet by mouth 2 (two) times daily.    . calcitRIOL (ROCALTROL) 0.25 MCG capsule Take by mouth.    . Cholecalciferol 125 MCG (5000 UT) capsule Take by mouth.    . citalopram (CELEXA) 20 MG tablet Take by mouth.    . cyanocobalamin (,  VITAMIN B-12,) 1000 MCG/ML injection Inject into the muscle.    . hydrALAZINE (APRESOLINE) 50 MG tablet Take by mouth.    . insulin glargine, 1 Unit Dial, (TOUJEO SOLOSTAR) 300 UNIT/ML Solostar Pen Inject 32 Units into the skin daily.    . isosorbide mononitrate (IMDUR) 120 MG 24 hr tablet Take by mouth.    . levothyroxine (SYNTHROID) 75 MCG tablet Take by mouth.    . linagliptin (TRADJENTA) 5 MG TABS tablet 1 (one) time each day    . losartan (COZAAR) 25 MG tablet Take by mouth.    . Omega-3 Fatty Acids (FISH OIL) 1000 MG CAPS Take by mouth.    Marland Kitchen omeprazole (PRILOSEC) 20 MG capsule Take 1  capsule by mouth daily.    . ranolazine (RANEXA) 500 MG 12 hr tablet Take by mouth.    . rosuvastatin (CRESTOR) 20 MG tablet Take by mouth.    . temazepam (RESTORIL) 15 MG capsule Take by mouth.    . torsemide (DEMADEX) 20 MG tablet Take by mouth.    . furosemide (LASIX) 20 MG tablet Take by mouth. (Patient not taking: No sig reported)    . nitroGLYCERIN (NITROSTAT) 0.4 MG SL tablet PLACE 1 TABLET UNDER THE TONGUE EVERY 5 MINUTES AS NEEDED FOR CHEST PAIN. MAY TAKE UP TO 3 DOSES (Patient not taking: No sig reported)    . Tiotropium Bromide-Olodaterol 2.5-2.5 MCG/ACT AERS Inhale into the lungs. (Patient not taking: No sig reported)     No current facility-administered medications for this visit.    REVIEW OF SYSTEMS:   Review of Systems  Constitutional: Positive for malaise/fatigue. Negative for chills, fever and weight loss.  HENT: Positive for nosebleeds. Negative for congestion, ear pain and tinnitus.   Eyes: Negative.  Negative for blurred vision and double vision.  Respiratory: Positive for shortness of breath. Negative for cough and sputum production.   Cardiovascular: Negative.  Negative for chest pain, palpitations and leg swelling.  Gastrointestinal: Negative.  Negative for abdominal pain, constipation, diarrhea, nausea and vomiting.  Genitourinary: Negative for dysuria, frequency and urgency.  Musculoskeletal: Negative for back pain and falls.  Skin: Negative.  Negative for rash.  Neurological: Positive for dizziness and weakness. Negative for headaches.  Endo/Heme/Allergies: Negative.  Does not bruise/bleed easily.  Psychiatric/Behavioral: Negative for depression. The patient is nervous/anxious. The patient does not have insomnia.     PHYSICAL EXAMINATION: ECOG PERFORMANCE STATUS: 1 - Symptomatic but completely ambulatory  Vitals:   05/05/21 1446  BP: (!) 149/42  Pulse: (!) 57  Resp: 18  Temp: (!) 97 F (36.1 C)   Filed Weights   05/05/21 1446  Weight: 147 lb 7.8 oz  (66.9 kg)    Physical Exam Constitutional:      Comments: Frail appearance, thin built, ambulates independently  HENT:     Head: Normocephalic and atraumatic.  Eyes:     Pupils: Pupils are equal, round, and reactive to light.  Cardiovascular:     Rate and Rhythm: Normal rate and regular rhythm.     Heart sounds: Normal heart sounds. No murmur heard.   Pulmonary:     Effort: Pulmonary effort is normal.     Breath sounds: Normal breath sounds. No wheezing.  Abdominal:     General: Bowel sounds are normal. There is no distension.     Palpations: Abdomen is soft.     Tenderness: There is no abdominal tenderness.  Musculoskeletal:        General: Normal range of motion.  Cervical back: Normal range of motion.  Skin:    General: Skin is warm and dry.     Findings: No rash.  Neurological:     Mental Status: She is alert and oriented to person, place, and time.  Psychiatric:        Mood and Affect: Mood normal.     LABORATORY DATA:  I have reviewed the data as listed CBC    Component Value Date/Time   WBC 2.6 (L) 05/05/2021 1425   RBC 2.24 (L) 05/05/2021 1532   RBC 2.22 (L) 05/05/2021 1425   HGB 7.4 (L) 05/05/2021 1425   HCT 22.9 (L) 05/05/2021 1425   PLT 271 05/05/2021 1425   MCV 103.2 (H) 05/05/2021 1425   MCH 33.3 05/05/2021 1425   MCHC 32.3 05/05/2021 1425   RDW 22.3 (H) 05/05/2021 1425   LYMPHSABS 0.8 05/05/2021 1425   MONOABS 0.2 05/05/2021 1425   EOSABS 0.0 05/05/2021 1425   BASOSABS 0.0 05/05/2021 1425   CMP Latest Ref Rng & Units 05/05/2021 05/03/2021 04/28/2021  Glucose 70 - 99 mg/dL 146(H) 138(H) 101(H)  BUN 8 - 23 mg/dL 57(H) 49(H) 56(H)  Creatinine 0.44 - 1.00 mg/dL 2.99(H) 3.07(H) 3.01(H)  Sodium 135 - 145 mmol/L 135 137 135  Potassium 3.5 - 5.1 mmol/L 4.5 4.8 4.9  Chloride 98 - 111 mmol/L 108 108 106  CO2 22 - 32 mmol/L 20(L) 21(L) 20(L)  Calcium 8.9 - 10.3 mg/dL 8.3(L) 8.4(L) 8.4(L)  Total Protein 6.5 - 8.1 g/dL 6.7 6.8 6.9  Total Bilirubin 0.3  - 1.2 mg/dL 0.7 0.7 0.8  Alkaline Phos 38 - 126 U/L 108 99 114  AST 15 - 41 U/L $Remo'20 21 18  'nVAIL$ ALT 0 - 44 U/L $Remo'12 13 12    'PNiMs$ RADIOGRAPHIC STUDIES: I have personally reviewed the radiological images as listed and agreed with the findings in the report. OCT, Retina - OU - Both Eyes  Result Date: 03/17/2021 Right Eye Quality was good. Scan locations included subfoveal. Central Foveal Thickness: 349. Progression has improved. Left Eye Quality was good. Scan locations included subfoveal. Central Foveal Thickness: 308. Progression has been stable. Notes OD with minor intraretinal fluid, status post vitrectomy membrane peel 1 week previous, much less thickening, much less outer retinal disturbance  ASSESSMENT & PLAN:   1. Anemia in stage 4 chronic kidney disease (HCC)   2. Stage 4 chronic kidney disease ([place])   3. Macrocytosis   4. Other neutropenia (HCC)    #Macrocytic anemia, Multifactorial.  Probably due to chronic kidney disease, rule out other etiologies. Check multiple myeloma panel, light chain ratio, reticulocyte count Adequate folate and vitamin B12 level. Normal copper level. Patient has a normal bilirubin, Less likely hemolysis. ? MDS 04/28/2021 iron panel ferritin 305, iron saturation 24.  Patient received 1 dose of IV Venofer treatment. 05/05/2021, CBC showed hemoglobin was 7.4, Proceed with Retacrit 10,000 units.   Neutropenia, ANC 1.6. check flowcytometry  CKD Stage IV.  Avoid nephro toxin.  Follow-up with nephrology.  Follow up in 1 week for labs and retacrit +/- PRBC transfusion.  .   We spent sufficient time to discuss many aspect of care, questions were answered to patient's satisfaction. A total of 40 minutes was spent on this visit.  With 15 minutes spent reviewing previous medical records, labs and treatment.  20 minutes counseling the patient on the diagnosis, goal of care, chemotherapy treatments, side effects of the treatment, management of symptoms.  Additional 5 minutes  was spent on answering  patient's questions.     Earlie Server, MD 05/09/21 11:40 PM

## 2021-05-10 ENCOUNTER — Encounter: Payer: Self-pay | Admitting: Oncology

## 2021-05-10 DIAGNOSIS — N184 Chronic kidney disease, stage 4 (severe): Secondary | ICD-10-CM | POA: Insufficient documentation

## 2021-05-10 DIAGNOSIS — D7589 Other specified diseases of blood and blood-forming organs: Secondary | ICD-10-CM | POA: Insufficient documentation

## 2021-05-10 LAB — MULTIPLE MYELOMA PANEL, SERUM
Albumin SerPl Elph-Mcnc: 3.5 g/dL (ref 2.9–4.4)
Albumin/Glob SerPl: 1.5 (ref 0.7–1.7)
Alpha 1: 0.3 g/dL (ref 0.0–0.4)
Alpha2 Glob SerPl Elph-Mcnc: 0.6 g/dL (ref 0.4–1.0)
B-Globulin SerPl Elph-Mcnc: 0.7 g/dL (ref 0.7–1.3)
Gamma Glob SerPl Elph-Mcnc: 0.9 g/dL (ref 0.4–1.8)
Globulin, Total: 2.5 g/dL (ref 2.2–3.9)
IgA: 101 mg/dL (ref 64–422)
IgG (Immunoglobin G), Serum: 955 mg/dL (ref 586–1602)
IgM (Immunoglobulin M), Srm: 217 mg/dL (ref 26–217)
Total Protein ELP: 6 g/dL (ref 6.0–8.5)

## 2021-05-11 ENCOUNTER — Inpatient Hospital Stay: Payer: Medicare PPO

## 2021-05-11 ENCOUNTER — Other Ambulatory Visit: Payer: Self-pay

## 2021-05-11 DIAGNOSIS — D708 Other neutropenia: Secondary | ICD-10-CM

## 2021-05-11 DIAGNOSIS — D631 Anemia in chronic kidney disease: Secondary | ICD-10-CM

## 2021-05-11 DIAGNOSIS — D7589 Other specified diseases of blood and blood-forming organs: Secondary | ICD-10-CM

## 2021-05-11 DIAGNOSIS — N184 Chronic kidney disease, stage 4 (severe): Secondary | ICD-10-CM

## 2021-05-11 LAB — CBC WITH DIFFERENTIAL/PLATELET
Abs Immature Granulocytes: 0.02 10*3/uL (ref 0.00–0.07)
Basophils Absolute: 0 10*3/uL (ref 0.0–0.1)
Basophils Relative: 1 %
Eosinophils Absolute: 0 10*3/uL (ref 0.0–0.5)
Eosinophils Relative: 0 %
HCT: 25.3 % — ABNORMAL LOW (ref 36.0–46.0)
Hemoglobin: 8.3 g/dL — ABNORMAL LOW (ref 12.0–15.0)
Immature Granulocytes: 1 %
Lymphocytes Relative: 23 %
Lymphs Abs: 0.7 10*3/uL (ref 0.7–4.0)
MCH: 34.3 pg — ABNORMAL HIGH (ref 26.0–34.0)
MCHC: 32.8 g/dL (ref 30.0–36.0)
MCV: 104.5 fL — ABNORMAL HIGH (ref 80.0–100.0)
Monocytes Absolute: 0.3 10*3/uL (ref 0.1–1.0)
Monocytes Relative: 9 %
Neutro Abs: 2 10*3/uL (ref 1.7–7.7)
Neutrophils Relative %: 66 %
Platelets: 350 10*3/uL (ref 150–400)
RBC: 2.42 MIL/uL — ABNORMAL LOW (ref 3.87–5.11)
RDW: 22.7 % — ABNORMAL HIGH (ref 11.5–15.5)
WBC: 2.9 10*3/uL — ABNORMAL LOW (ref 4.0–10.5)
nRBC: 0 % (ref 0.0–0.2)

## 2021-05-11 LAB — LACTATE DEHYDROGENASE: LDH: 200 U/L — ABNORMAL HIGH (ref 98–192)

## 2021-05-11 LAB — TSH: TSH: 1.005 u[IU]/mL (ref 0.350–4.500)

## 2021-05-11 LAB — SAMPLE TO BLOOD BANK

## 2021-05-12 ENCOUNTER — Inpatient Hospital Stay: Payer: Medicare PPO

## 2021-05-12 ENCOUNTER — Inpatient Hospital Stay (HOSPITAL_BASED_OUTPATIENT_CLINIC_OR_DEPARTMENT_OTHER): Payer: Medicare PPO | Admitting: Oncology

## 2021-05-12 ENCOUNTER — Encounter: Payer: Self-pay | Admitting: Oncology

## 2021-05-12 VITALS — BP 133/57 | HR 53 | Temp 97.1°F | Resp 14 | Wt 146.3 lb

## 2021-05-12 DIAGNOSIS — D631 Anemia in chronic kidney disease: Secondary | ICD-10-CM

## 2021-05-12 DIAGNOSIS — I7782 Antineutrophilic cytoplasmic antibody (ANCA) vasculitis: Secondary | ICD-10-CM

## 2021-05-12 DIAGNOSIS — N189 Chronic kidney disease, unspecified: Secondary | ICD-10-CM | POA: Diagnosis not present

## 2021-05-12 DIAGNOSIS — I776 Arteritis, unspecified: Secondary | ICD-10-CM | POA: Diagnosis not present

## 2021-05-12 DIAGNOSIS — N184 Chronic kidney disease, stage 4 (severe): Secondary | ICD-10-CM

## 2021-05-12 DIAGNOSIS — D7589 Other specified diseases of blood and blood-forming organs: Secondary | ICD-10-CM | POA: Diagnosis not present

## 2021-05-12 LAB — PATHOLOGIST SMEAR REVIEW

## 2021-05-12 NOTE — Progress Notes (Signed)
FOLLOW UP NOTE  Patient Care Team: Ezequiel Kayser, MD as PCP - General (Internal Medicine)  CHIEF COMPLAINTS/PURPOSE OF CONSULTATION: Anemia  HISTORY OF PRESENTING ILLNESS:  Alexandria Moody 75 y.o. female presents for follow-up  PERTINENT HEMATOLOGY HISTORY Patient previously was seen by nurse practitioner Faythe Casa, patient switched care to me on 05/05/21 Extensive medical record review was performed by me  PMH  significant for chronic kidney disease stage IV, prior history of ANCA vasculitis, diabetes type 2, hypertension, PVD, hypothyroidism, GERD chronically secondary to chronic kidney disease.  04/05/2021 She evaluated by Dr. Posey Pronto on  for joint pain and found to have a large drop in her hemoglobin.  She had been seen 1 day prior by Dr. Holley Raring with a reported hemoglobin of 10.3.  She just moved to the area from [facility].  She has previously received blood transfusions, iron and Epogen.    Patient was seen by NP Faythe Casa on 04/12/2021.  Anemia was felt and the patient was started on replacement therapy.  04/20/2021 Retacrit 10,000 units 04/25/2021, IV Venofer 200 mg  Patient reports that appetite is fair.  Weight has been stable. She is not able to tolerate oral iron supplementation due to GI toxicities.  She receives monthly vitamin B12 injections through her PCP  Reports history of EGD/colonoscopy many years ago.  No recent EGD/colonoscopy.  INTERVAL HISTORY Jenney Brester is a 75 y.o. female who has above history reviewed by me today presents for follow up visit for management of anemia Problems and complaints are listed below: Patient had a Retacrit last week. Still feel very weak and fatigued. No new complaints  MEDICAL HISTORY:  Past Medical History:  Diagnosis Date  . Diabetes mellitus without complication ([place])   . Hypertension     SURGICAL HISTORY: Past Surgical History:  Procedure Laterality Date  . CATARACT EXTRACTION Left 2014  . EYE  SURGERY Left 07/22/2020   Dr. Towanda Octave, Vit and Marta Lamas    SOCIAL HISTORY: Social History   Socioeconomic History  . Marital status: Widowed    Spouse name: Not on file  . Number of children: Not on file  . Years of education: Not on file  . Highest education level: Not on file  Occupational History  . Not on file  Tobacco Use  . Smoking status: Never Smoker  . Smokeless tobacco: Never Used  Substance and Sexual Activity  . Alcohol use: Not on file  . Drug use: Not on file  . Sexual activity: Not on file  Other Topics Concern  . Not on file  Social History Narrative  . Not on file   Social Determinants of Health   Financial Resource Strain: Not on file  Food Insecurity: Not on file  Transportation Needs: Not on file  Physical Activity: Not on file  Stress: Not on file  Social Connections: Not on file  Intimate Partner Violence: Not on file    FAMILY HISTORY: History reviewed. No pertinent family history.  ALLERGIES:  is allergic to other.  MEDICATIONS:  Current Outpatient Medications  Medication Sig Dispense Refill  . acetaminophen (TYLENOL) 325 MG tablet Take by mouth.    Marland Kitchen albuterol (VENTOLIN HFA) 108 (90 Base) MCG/ACT inhaler Inhale into the lungs.    Marland Kitchen amLODipine (NORVASC) 5 MG tablet Take 1 tablet by mouth 2 (two) times daily.    Marland Kitchen aspirin 81 MG EC tablet Take by mouth.    . azaTHIOprine (IMURAN) 50 MG tablet Take 1 tablet by  mouth 2 (two) times daily.    . calcitRIOL (ROCALTROL) 0.25 MCG capsule Take by mouth.    . Cholecalciferol 125 MCG (5000 UT) capsule Take by mouth.    . citalopram (CELEXA) 20 MG tablet Take by mouth.    . furosemide (LASIX) 20 MG tablet Take by mouth.    . hydrALAZINE (APRESOLINE) 50 MG tablet Take by mouth.    . insulin glargine, 1 Unit Dial, (TOUJEO SOLOSTAR) 300 UNIT/ML Solostar Pen Inject 32 Units into the skin daily.    . Insulin Pen Needle (BD PEN NEEDLE NANO 2ND GEN) 32G X 4 MM MISC daily.    . isosorbide mononitrate  (IMDUR) 120 MG 24 hr tablet Take by mouth.    . levothyroxine (SYNTHROID) 75 MCG tablet Take by mouth.    . linagliptin (TRADJENTA) 5 MG TABS tablet 1 (one) time each day    . losartan (COZAAR) 25 MG tablet Take by mouth.    . Omega-3 Fatty Acids (FISH OIL) 1000 MG CAPS Take by mouth.    Marland Kitchen omeprazole (PRILOSEC) 20 MG capsule Take 1 capsule by mouth daily.    . ranolazine (RANEXA) 500 MG 12 hr tablet Take by mouth.    . rosuvastatin (CRESTOR) 20 MG tablet Take by mouth.    . temazepam (RESTORIL) 15 MG capsule Take by mouth.    . torsemide (DEMADEX) 20 MG tablet Take by mouth.    . cyanocobalamin (,VITAMIN B-12,) 1000 MCG/ML injection Inject into the muscle. (Patient not taking: Reported on 05/12/2021)    . nitroGLYCERIN (NITROSTAT) 0.4 MG SL tablet PLACE 1 TABLET UNDER THE TONGUE EVERY 5 MINUTES AS NEEDED FOR CHEST PAIN. MAY TAKE UP TO 3 DOSES (Patient not taking: No sig reported)    . Tiotropium Bromide-Olodaterol 2.5-2.5 MCG/ACT AERS Inhale into the lungs. (Patient not taking: No sig reported)     No current facility-administered medications for this visit.    REVIEW OF SYSTEMS:   Review of Systems  Constitutional: Positive for malaise/fatigue. Negative for chills, fever and weight loss.  HENT: Negative for congestion, ear pain, nosebleeds and tinnitus.   Eyes: Negative.  Negative for blurred vision and double vision.  Respiratory: Positive for shortness of breath. Negative for cough and sputum production.   Cardiovascular: Negative.  Negative for chest pain, palpitations and leg swelling.  Gastrointestinal: Negative.  Negative for abdominal pain, constipation, diarrhea, nausea and vomiting.  Genitourinary: Negative for dysuria, frequency and urgency.  Musculoskeletal: Negative for back pain and falls.  Skin: Negative.  Negative for rash.  Neurological: Positive for dizziness and weakness. Negative for headaches.  Endo/Heme/Allergies: Negative.  Does not bruise/bleed easily.   Psychiatric/Behavioral: Negative for depression. The patient is not nervous/anxious and does not have insomnia.     PHYSICAL EXAMINATION: ECOG PERFORMANCE STATUS: 1 - Symptomatic but completely ambulatory  Vitals:   05/12/21 1342  BP: (!) 133/57  Pulse: (!) 53  Resp: 14  Temp: (!) 97.1 F (36.2 C)  SpO2: 99%   Filed Weights   05/12/21 1342  Weight: 146 lb 4.4 oz (66.3 kg)    Physical Exam Constitutional:      Comments: Frail appearance, thin built, ambulates independently  HENT:     Head: Normocephalic and atraumatic.  Eyes:     Pupils: Pupils are equal, round, and reactive to light.  Cardiovascular:     Rate and Rhythm: Normal rate and regular rhythm.     Heart sounds: Normal heart sounds. No murmur heard.   Pulmonary:  Effort: Pulmonary effort is normal.     Breath sounds: Normal breath sounds. No wheezing.  Abdominal:     General: Bowel sounds are normal. There is no distension.     Palpations: Abdomen is soft.     Tenderness: There is no abdominal tenderness.  Musculoskeletal:        General: Normal range of motion.     Cervical back: Normal range of motion.  Skin:    General: Skin is warm and dry.     Findings: No rash.  Neurological:     Mental Status: She is alert and oriented to person, place, and time.  Psychiatric:        Mood and Affect: Mood normal.     LABORATORY DATA:  I have reviewed the data as listed CBC    Component Value Date/Time   WBC 2.9 (L) 05/11/2021 1346   RBC 2.42 (L) 05/11/2021 1346   HGB 8.3 (L) 05/11/2021 1346   HCT 25.3 (L) 05/11/2021 1346   PLT 350 05/11/2021 1346   MCV 104.5 (H) 05/11/2021 1346   MCH 34.3 (H) 05/11/2021 1346   MCHC 32.8 05/11/2021 1346   RDW 22.7 (H) 05/11/2021 1346   LYMPHSABS 0.7 05/11/2021 1346   MONOABS 0.3 05/11/2021 1346   EOSABS 0.0 05/11/2021 1346   BASOSABS 0.0 05/11/2021 1346   CMP Latest Ref Rng & Units 05/05/2021 05/03/2021 04/28/2021  Glucose 70 - 99 mg/dL 146(H) 138(H) 101(H)  BUN  8 - 23 mg/dL 57(H) 49(H) 56(H)  Creatinine 0.44 - 1.00 mg/dL 2.99(H) 3.07(H) 3.01(H)  Sodium 135 - 145 mmol/L 135 137 135  Potassium 3.5 - 5.1 mmol/L 4.5 4.8 4.9  Chloride 98 - 111 mmol/L 108 108 106  CO2 22 - 32 mmol/L 20(L) 21(L) 20(L)  Calcium 8.9 - 10.3 mg/dL 8.3(L) 8.4(L) 8.4(L)  Total Protein 6.5 - 8.1 g/dL 6.7 6.8 6.9  Total Bilirubin 0.3 - 1.2 mg/dL 0.7 0.7 0.8  Alkaline Phos 38 - 126 U/L 108 99 114  AST 15 - 41 U/L _0 ALT 0 - 44 U/L _1 RADIOGRAPHIC STUDIES: I have personally reviewed the radiological images as listed and agreed with the findings in the report. OCT, Retina - OU - Both Eyes  Result Date: 03/17/2021 Right Eye Quality was good. Scan locations included subfoveal. Central Foveal Thickness: 349. Progression has improved. Left Eye Quality was good. Scan locations included subfoveal. Central Foveal Thickness: 308. Progression has been stable. Notes OD with minor intraretinal fluid, status post vitrectomy membrane peel 1 week previous, much less thickening, much less outer retinal disturbance  ASSESSMENT & PLAN:   1. Macrocytosis   2. Anemia in chronic kidney disease, unspecified CKD stage   3. ANCA-positive vasculitis (HCC)   4. Stage 4 chronic kidney disease (HCC)    #Macrocytic anemia, Multifactorial.  Probably due to chronic kidney disease, rule out other etiologies. Check multiple myeloma panel, light chain ratio, reticulocyte count Adequate folate and vitamin B12 level. Normal copper level. Patient has a normal bilirubin, Less likely hemolysis.  Possibly due to Imuran. 04/28/2021 iron panel ferritin 305, iron saturation 24.  Patient received 1 dose of IV Venofer treatment. 05/05/2021, hemoglobin was 7.4, Retacrit 10,000 units.  05/12/2021, hemoglobin has improved to 8.3.  Hold off blood transfusion and Retacrit today.  #ANCA vasculitis, patient follows up with Dr. Posey Pronto and has been on Imuran with recently increased dose to 50 mg twice daily.   Imuran may have contributed to  the acute drop of her hemoglobin.  I have discussed with Dr. Posey Pronto and he agrees with temporarily holding Imuran until patient follows up with him. RN to call patient and advise her to hold off Imuran.  Neutropenia, ANC has normalized.  Flow cytometry is negative.  CKD Stage IV.  Avoid nephro toxin.  Follow-up with nephrology.  Follow up in 1 week for labs and retacrit Follow-up in 3 weeks lab MD +/- Retacrit.  Marland Kitchen   Earlie Server, MD 05/12/21 10:25 PM

## 2021-05-13 ENCOUNTER — Telehealth: Payer: Self-pay

## 2021-05-13 LAB — COMP PANEL: LEUKEMIA/LYMPHOMA

## 2021-05-13 NOTE — Telephone Encounter (Signed)
-----   Message from Earlie Server, MD sent at 05/12/2021 10:34 PM EDT ----- I tried to call patient and not able to reach her. Please tell her that I have discussed her case with Dr. Posey Pronto who agrees with holding Imuran for now.  She has an appointment with him in July and Dr. Posey Pronto want her to hold off Imuran until then. Same follow-up plan

## 2021-05-13 NOTE — Telephone Encounter (Signed)
Patient notified

## 2021-05-20 ENCOUNTER — Inpatient Hospital Stay: Payer: Medicare PPO

## 2021-05-20 ENCOUNTER — Ambulatory Visit: Payer: Medicare PPO

## 2021-05-20 ENCOUNTER — Other Ambulatory Visit: Payer: Medicare PPO

## 2021-05-20 ENCOUNTER — Inpatient Hospital Stay: Payer: Medicare PPO | Attending: Internal Medicine

## 2021-05-20 ENCOUNTER — Ambulatory Visit: Payer: Medicare PPO | Admitting: Internal Medicine

## 2021-05-20 ENCOUNTER — Other Ambulatory Visit: Payer: Self-pay

## 2021-05-20 VITALS — BP 132/56 | HR 51 | Temp 96.0°F | Resp 18

## 2021-05-20 DIAGNOSIS — N184 Chronic kidney disease, stage 4 (severe): Secondary | ICD-10-CM | POA: Insufficient documentation

## 2021-05-20 DIAGNOSIS — D7589 Other specified diseases of blood and blood-forming organs: Secondary | ICD-10-CM

## 2021-05-20 DIAGNOSIS — D631 Anemia in chronic kidney disease: Secondary | ICD-10-CM | POA: Insufficient documentation

## 2021-05-20 DIAGNOSIS — N189 Chronic kidney disease, unspecified: Secondary | ICD-10-CM

## 2021-05-20 LAB — CBC WITH DIFFERENTIAL/PLATELET
Abs Immature Granulocytes: 0.02 10*3/uL (ref 0.00–0.07)
Basophils Absolute: 0 10*3/uL (ref 0.0–0.1)
Basophils Relative: 1 %
Eosinophils Absolute: 0 10*3/uL (ref 0.0–0.5)
Eosinophils Relative: 0 %
HCT: 23.5 % — ABNORMAL LOW (ref 36.0–46.0)
Hemoglobin: 7.8 g/dL — ABNORMAL LOW (ref 12.0–15.0)
Immature Granulocytes: 1 %
Lymphocytes Relative: 25 %
Lymphs Abs: 1 10*3/uL (ref 0.7–4.0)
MCH: 34.5 pg — ABNORMAL HIGH (ref 26.0–34.0)
MCHC: 33.2 g/dL (ref 30.0–36.0)
MCV: 104 fL — ABNORMAL HIGH (ref 80.0–100.0)
Monocytes Absolute: 0.3 10*3/uL (ref 0.1–1.0)
Monocytes Relative: 9 %
Neutro Abs: 2.4 10*3/uL (ref 1.7–7.7)
Neutrophils Relative %: 64 %
Platelets: 206 10*3/uL (ref 150–400)
RBC: 2.26 MIL/uL — ABNORMAL LOW (ref 3.87–5.11)
RDW: 23 % — ABNORMAL HIGH (ref 11.5–15.5)
WBC: 3.8 10*3/uL — ABNORMAL LOW (ref 4.0–10.5)
nRBC: 0 % (ref 0.0–0.2)

## 2021-05-20 MED ORDER — EPOETIN ALFA-EPBX 10000 UNIT/ML IJ SOLN
10000.0000 [IU] | Freq: Once | INTRAMUSCULAR | Status: AC
  Administered 2021-05-20: 10000 [IU] via SUBCUTANEOUS

## 2021-06-02 ENCOUNTER — Ambulatory Visit: Payer: Medicare PPO

## 2021-06-02 ENCOUNTER — Other Ambulatory Visit: Payer: Medicare PPO

## 2021-06-02 ENCOUNTER — Ambulatory Visit: Payer: Medicare PPO | Admitting: Oncology

## 2021-06-03 ENCOUNTER — Ambulatory Visit: Payer: Medicare PPO

## 2021-06-03 ENCOUNTER — Ambulatory Visit: Payer: Medicare PPO | Admitting: Internal Medicine

## 2021-06-03 ENCOUNTER — Other Ambulatory Visit: Payer: Medicare PPO

## 2021-06-12 ENCOUNTER — Other Ambulatory Visit: Payer: Self-pay

## 2021-06-16 ENCOUNTER — Inpatient Hospital Stay (HOSPITAL_BASED_OUTPATIENT_CLINIC_OR_DEPARTMENT_OTHER): Payer: Medicare PPO | Admitting: Oncology

## 2021-06-16 ENCOUNTER — Inpatient Hospital Stay: Payer: Medicare PPO

## 2021-06-16 ENCOUNTER — Encounter: Payer: Self-pay | Admitting: Oncology

## 2021-06-16 ENCOUNTER — Other Ambulatory Visit: Payer: Self-pay

## 2021-06-16 VITALS — BP 129/48 | HR 55 | Temp 96.7°F | Wt 142.5 lb

## 2021-06-16 DIAGNOSIS — I7782 Antineutrophilic cytoplasmic antibody (ANCA) vasculitis: Secondary | ICD-10-CM

## 2021-06-16 DIAGNOSIS — D631 Anemia in chronic kidney disease: Secondary | ICD-10-CM

## 2021-06-16 DIAGNOSIS — I776 Arteritis, unspecified: Secondary | ICD-10-CM

## 2021-06-16 DIAGNOSIS — D7589 Other specified diseases of blood and blood-forming organs: Secondary | ICD-10-CM

## 2021-06-16 DIAGNOSIS — N184 Chronic kidney disease, stage 4 (severe): Secondary | ICD-10-CM | POA: Diagnosis not present

## 2021-06-16 DIAGNOSIS — N189 Chronic kidney disease, unspecified: Secondary | ICD-10-CM

## 2021-06-16 LAB — CBC WITH DIFFERENTIAL/PLATELET
Abs Immature Granulocytes: 0.03 10*3/uL (ref 0.00–0.07)
Basophils Absolute: 0 10*3/uL (ref 0.0–0.1)
Basophils Relative: 0 %
Eosinophils Absolute: 0 10*3/uL (ref 0.0–0.5)
Eosinophils Relative: 0 %
HCT: 25.3 % — ABNORMAL LOW (ref 36.0–46.0)
Hemoglobin: 8.5 g/dL — ABNORMAL LOW (ref 12.0–15.0)
Immature Granulocytes: 1 %
Lymphocytes Relative: 14 %
Lymphs Abs: 0.9 10*3/uL (ref 0.7–4.0)
MCH: 35.3 pg — ABNORMAL HIGH (ref 26.0–34.0)
MCHC: 33.6 g/dL (ref 30.0–36.0)
MCV: 105 fL — ABNORMAL HIGH (ref 80.0–100.0)
Monocytes Absolute: 0.4 10*3/uL (ref 0.1–1.0)
Monocytes Relative: 6 %
Neutro Abs: 5.2 10*3/uL (ref 1.7–7.7)
Neutrophils Relative %: 79 %
Platelets: 197 10*3/uL (ref 150–400)
RBC: 2.41 MIL/uL — ABNORMAL LOW (ref 3.87–5.11)
RDW: 17.2 % — ABNORMAL HIGH (ref 11.5–15.5)
WBC: 6.6 10*3/uL (ref 4.0–10.5)
nRBC: 0 % (ref 0.0–0.2)

## 2021-06-16 MED ORDER — EPOETIN ALFA-EPBX 10000 UNIT/ML IJ SOLN
10000.0000 [IU] | Freq: Once | INTRAMUSCULAR | Status: AC
  Administered 2021-06-16: 10000 [IU] via SUBCUTANEOUS

## 2021-06-16 NOTE — Progress Notes (Signed)
Pt is feeling very dizzy and has been feeling that way since being dx with COVID, was taken off hydralazine due to having BP issues. Has recently started losartan.

## 2021-06-18 ENCOUNTER — Encounter: Payer: Self-pay | Admitting: Oncology

## 2021-06-18 NOTE — Progress Notes (Signed)
FOLLOW UP NOTE  Patient Care Team: Ezequiel Kayser, MD as PCP - General (Internal Medicine)  CHIEF COMPLAINTS/PURPOSE OF CONSULTATION: Anemia  HISTORY OF PRESENTING ILLNESS:  Alexandria Moody 75 y.o. female presents for follow-up  PERTINENT HEMATOLOGY HISTORY Patient previously was seen by nurse practitioner Faythe Casa, patient switched care to me on 05/05/21 Extensive medical record review was performed by me  PMH  significant for chronic kidney disease stage IV, prior history of ANCA vasculitis, diabetes type 2, hypertension, PVD, hypothyroidism, GERD chronically secondary to chronic kidney disease.  04/05/2021 She evaluated by Dr. Posey Pronto on  for joint pain and found to have a large drop in her hemoglobin.  She had been seen 1 day prior by Dr. Holley Raring with a reported hemoglobin of 10.3.  She just moved to the area from [facility].  She has previously received blood transfusions, iron and Epogen.    Patient was seen by NP Faythe Casa on 04/12/2021.  Anemia was felt and the patient was started on replacement therapy.  04/20/2021 Retacrit 10,000 units  04/28/2021 iron panel ferritin 305, iron saturation 24.  Patient received 1 dose of IV Venofer treatment. 05/03/2021, IV Venofer x1 05/05/2021, hemoglobin was 7.4, Retacrit 10,000 units.  05/11/2021, hemoglobin has improved to 8.3.  Hold off blood transfusion and Retacrit today.  Patient reports that appetite is fair.  Weight has been stable. She is not able to tolerate oral iron supplementation due to GI toxicities.  She receives monthly vitamin B12 injections through her PCP  Reports history of EGD/colonoscopy many years ago.  No recent EGD/colonoscopy.  INTERVAL HISTORY Alexandria Moody is a 75 y.o. female who has above history reviewed by me today presents for follow up visit for management of anemia Problems and complaints are listed below: Patient continues to feel weak and dizzy.  Patient has been on Retacrit  treatments.  MEDICAL HISTORY:  Past Medical History:  Diagnosis Date   Diabetes mellitus without complication ([place])    Hypertension     SURGICAL HISTORY: Past Surgical History:  Procedure Laterality Date   CATARACT EXTRACTION Left 2014   EYE SURGERY Left 07/22/2020   Dr. Towanda Octave, [facility]    SOCIAL HISTORY: Social History   Socioeconomic History   Marital status: Widowed    Spouse name: Not on file   Number of children: Not on file   Years of education: Not on file   Highest education level: Not on file  Occupational History   Not on file  Tobacco Use   Smoking status: Never   Smokeless tobacco: Never  Substance and Sexual Activity   Alcohol use: Not on file   Drug use: Not on file   Sexual activity: Not on file  Other Topics Concern   Not on file  Social History Narrative   Not on file   Social Determinants of Health   Financial Resource Strain: Not on file  Food Insecurity: Not on file  Transportation Needs: Not on file  Physical Activity: Not on file  Stress: Not on file  Social Connections: Not on file  Intimate Partner Violence: Not on file    FAMILY HISTORY: History reviewed. No pertinent family history.  ALLERGIES:  is allergic to other.  MEDICATIONS:  Current Outpatient Medications  Medication Sig Dispense Refill   acetaminophen (TYLENOL) 325 MG tablet Take by mouth.     albuterol (VENTOLIN HFA) 108 (90 Base) MCG/ACT inhaler Inhale into the lungs.     amLODipine (NORVASC) 5  MG tablet Take 1 tablet by mouth 2 (two) times daily.     aspirin 81 MG EC tablet Take by mouth.     azaTHIOprine (IMURAN) 50 MG tablet Take 1 tablet by mouth 2 (two) times daily.     calcitRIOL (ROCALTROL) 0.25 MCG capsule Take by mouth.     Cholecalciferol 125 MCG (5000 UT) capsule Take by mouth.     citalopram (CELEXA) 20 MG tablet Take by mouth.     insulin glargine, 1 Unit Dial, (TOUJEO SOLOSTAR) 300 UNIT/ML Solostar Pen Inject 32 Units into the skin daily.      Insulin Pen Needle (BD PEN NEEDLE NANO 2ND GEN) 32G X 4 MM MISC daily.     ipratropium (ATROVENT) 0.06 % nasal spray Place into the nose.     levothyroxine (SYNTHROID) 75 MCG tablet Take by mouth.     linagliptin (TRADJENTA) 5 MG TABS tablet 1 (one) time each day     losartan (COZAAR) 25 MG tablet Take by mouth.     Omega-3 Fatty Acids (FISH OIL) 1000 MG CAPS Take by mouth.     omeprazole (PRILOSEC) 20 MG capsule Take 1 capsule by mouth daily.     ranolazine (RANEXA) 500 MG 12 hr tablet Take by mouth.     rosuvastatin (CRESTOR) 20 MG tablet Take by mouth.     temazepam (RESTORIL) 15 MG capsule Take by mouth.     torsemide (DEMADEX) 20 MG tablet Take by mouth.     furosemide (LASIX) 20 MG tablet Take by mouth. (Patient not taking: Reported on 06/16/2021)     hydrALAZINE (APRESOLINE) 50 MG tablet Take by mouth. (Patient not taking: Reported on 06/16/2021)     isosorbide mononitrate (IMDUR) 120 MG 24 hr tablet Take by mouth. (Patient not taking: Reported on 06/16/2021)     nitroGLYCERIN (NITROSTAT) 0.4 MG SL tablet PLACE 1 TABLET UNDER THE TONGUE EVERY 5 MINUTES AS NEEDED FOR CHEST PAIN. MAY TAKE UP TO 3 DOSES (Patient not taking: No sig reported)     Tiotropium Bromide-Olodaterol 2.5-2.5 MCG/ACT AERS Inhale into the lungs. (Patient not taking: Reported on 06/16/2021)     No current facility-administered medications for this visit.    REVIEW OF SYSTEMS:   Review of Systems  Constitutional:  Positive for malaise/fatigue. Negative for chills, fever and weight loss.  HENT:  Negative for congestion, ear pain, nosebleeds and tinnitus.   Eyes: Negative.  Negative for blurred vision and double vision.  Respiratory:  Positive for shortness of breath. Negative for cough and sputum production.   Cardiovascular: Negative.  Negative for chest pain, palpitations and leg swelling.  Gastrointestinal: Negative.  Negative for abdominal pain, constipation, diarrhea, nausea and vomiting.  Genitourinary:   Negative for dysuria, frequency and urgency.  Musculoskeletal:  Negative for back pain and falls.  Skin: Negative.  Negative for rash.  Neurological:  Positive for dizziness and weakness. Negative for headaches.  Endo/Heme/Allergies: Negative.  Does not bruise/bleed easily.  Psychiatric/Behavioral:  Negative for depression. The patient is not nervous/anxious and does not have insomnia.    PHYSICAL EXAMINATION: ECOG PERFORMANCE STATUS: 1 - Symptomatic but completely ambulatory  Vitals:   06/16/21 1452  BP: (!) 129/48  Pulse: (!) 55  Temp: (!) 96.7 F (35.9 C)  SpO2: 100%   Filed Weights   06/16/21 1452  Weight: 142 lb 8.4 oz (64.7 kg)    Physical Exam Constitutional:      Comments: Frail appearance, thin built, ambulates independently  HENT:  Head: Normocephalic and atraumatic.  Eyes:     Pupils: Pupils are equal, round, and reactive to light.  Cardiovascular:     Rate and Rhythm: Normal rate and regular rhythm.     Heart sounds: Normal heart sounds. No murmur heard. Pulmonary:     Effort: Pulmonary effort is normal.     Breath sounds: Normal breath sounds. No wheezing.  Abdominal:     General: Bowel sounds are normal. There is no distension.     Palpations: Abdomen is soft.     Tenderness: There is no abdominal tenderness.  Musculoskeletal:        General: Normal range of motion.     Cervical back: Normal range of motion.  Skin:    General: Skin is warm and dry.     Findings: No rash.  Neurological:     Mental Status: She is alert and oriented to person, place, and time.  Psychiatric:        Mood and Affect: Mood normal.    LABORATORY DATA:  I have reviewed the data as listed CBC    Component Value Date/Time   WBC 6.6 06/16/2021 1444   RBC 2.41 (L) 06/16/2021 1444   HGB 8.5 (L) 06/16/2021 1444   HCT 25.3 (L) 06/16/2021 1444   PLT 197 06/16/2021 1444   MCV 105.0 (H) 06/16/2021 1444   MCH 35.3 (H) 06/16/2021 1444   MCHC 33.6 06/16/2021 1444   RDW 17.2  (H) 06/16/2021 1444   LYMPHSABS 0.9 06/16/2021 1444   MONOABS 0.4 06/16/2021 1444   EOSABS 0.0 06/16/2021 1444   BASOSABS 0.0 06/16/2021 1444   CMP Latest Ref Rng & Units 05/05/2021 05/03/2021 04/28/2021  Glucose 70 - 99 mg/dL 146(H) 138(H) 101(H)  BUN 8 - 23 mg/dL 57(H) 49(H) 56(H)  Creatinine 0.44 - 1.00 mg/dL 2.99(H) 3.07(H) 3.01(H)  Sodium 135 - 145 mmol/L 135 137 135  Potassium 3.5 - 5.1 mmol/L 4.5 4.8 4.9  Chloride 98 - 111 mmol/L 108 108 106  CO2 22 - 32 mmol/L 20(L) 21(L) 20(L)  Calcium 8.9 - 10.3 mg/dL 8.3(L) 8.4(L) 8.4(L)  Total Protein 6.5 - 8.1 g/dL 6.7 6.8 6.9  Total Bilirubin 0.3 - 1.2 mg/dL 0.7 0.7 0.8  Alkaline Phos 38 - 126 U/L 108 99 114  AST 15 - 41 U/L 20 21 18   ALT 0 - 44 U/L 12 13 12     RADIOGRAPHIC STUDIES: I have personally reviewed the radiological images as listed and agreed with the findings in the report. No results found.  ASSESSMENT & PLAN:   1. Anemia in stage 4 chronic kidney disease ([place])   2. 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 Stage 4 chronic kidney disease ([facility])    #Macrocytic anemia, Multifactorial.  Probably due to chronic kidney disease, recent Imuran use, autoimmune disease. Imuran has been stopped. Adequate folate and vitamin B12 level.  Normal bilirubin, less likely hemolysis SPEP showed negative M protein.  Light chain ratio 1.64, normal. Patient has received IV Venofer treatments and has been on Retacrit treatments Labs reviewed with patient. proceed with Retacrit 10,000 units today. H&H in 2 weeks, 4 weeks, 6 weeks +/- Retacrit.  #ANCA vasculitis, Imuran has been held.  Discussed with rheumatology. #CKD Stage IV.  Avoid nephro toxin.  Follow-up with nephrology.   Earlie Server, MD 06/18/21 10:45 AM

## 2021-06-24 ENCOUNTER — Ambulatory Visit (INDEPENDENT_AMBULATORY_CARE_PROVIDER_SITE_OTHER): Payer: Medicare PPO | Admitting: Vascular Surgery

## 2021-06-24 ENCOUNTER — Ambulatory Visit (INDEPENDENT_AMBULATORY_CARE_PROVIDER_SITE_OTHER): Payer: Medicare PPO

## 2021-06-24 ENCOUNTER — Other Ambulatory Visit (INDEPENDENT_AMBULATORY_CARE_PROVIDER_SITE_OTHER): Payer: Self-pay | Admitting: Vascular Surgery

## 2021-06-24 ENCOUNTER — Other Ambulatory Visit: Payer: Self-pay

## 2021-06-24 VITALS — BP 135/59 | HR 51 | Resp 14 | Ht 63.0 in | Wt 141.0 lb

## 2021-06-24 DIAGNOSIS — I739 Peripheral vascular disease, unspecified: Secondary | ICD-10-CM

## 2021-06-24 DIAGNOSIS — I70212 Atherosclerosis of native arteries of extremities with intermittent claudication, left leg: Secondary | ICD-10-CM

## 2021-06-24 DIAGNOSIS — N184 Chronic kidney disease, stage 4 (severe): Secondary | ICD-10-CM

## 2021-06-24 DIAGNOSIS — I10 Essential (primary) hypertension: Secondary | ICD-10-CM | POA: Diagnosis not present

## 2021-06-24 DIAGNOSIS — E1122 Type 2 diabetes mellitus with diabetic chronic kidney disease: Secondary | ICD-10-CM

## 2021-06-24 DIAGNOSIS — R6889 Other general symptoms and signs: Secondary | ICD-10-CM

## 2021-06-24 NOTE — Assessment & Plan Note (Signed)
ABI today is 0.95 on the right and 0.72 on the left.  Has stable claudication symptoms without limb threatening symptoms.  With her severe chronic kidney disease, I would not recommend any intervention unless she had limb threatening symptoms and she does not really want any intervention.  At this point, I have offered her continued follow-up and I will plan to see her in 1 year with noninvasive studies

## 2021-06-24 NOTE — Progress Notes (Signed)
MRN : [id number]  Alexandria Moody is a 75 y.o. (Dec 11, 1946) female who presents with chief complaint of  Chief Complaint  Patient presents with   Follow-up    ultrasound  .  History of Present Illness: Patient returns today in follow up of her PAD. She is doing well. NO major changes.  Has stable claudication symptoms and neuropathy, but no rest pain or ulceration.  No new complaints today. ABI today is 0.95 on the right and 0.72 on the left.  Has stable claudication symptoms without limb threatening symptoms.  Current Outpatient Medications  Medication Sig Dispense Refill   acetaminophen (TYLENOL) 325 MG tablet Take by mouth.     albuterol (VENTOLIN HFA) 108 (90 Base) MCG/ACT inhaler Inhale into the lungs.     amLODipine (NORVASC) 5 MG tablet Take 1 tablet by mouth 2 (two) times daily.     aspirin 81 MG EC tablet Take by mouth.     azaTHIOprine (IMURAN) 50 MG tablet Take 1 tablet by mouth 2 (two) times daily.     calcitRIOL (ROCALTROL) 0.25 MCG capsule Take by mouth.     Cholecalciferol 125 MCG (5000 UT) capsule Take by mouth.     citalopram (CELEXA) 20 MG tablet Take by mouth.     insulin glargine, 1 Unit Dial, (TOUJEO SOLOSTAR) 300 UNIT/ML Solostar Pen Inject 32 Units into the skin daily.     Insulin Pen Needle (BD PEN NEEDLE NANO 2ND GEN) 32G X 4 MM MISC daily.     ipratropium (ATROVENT) 0.06 % nasal spray Place into the nose.     isosorbide mononitrate (IMDUR) 120 MG 24 hr tablet Take by mouth.     levothyroxine (SYNTHROID) 75 MCG tablet Take by mouth.     linagliptin (TRADJENTA) 5 MG TABS tablet 1 (one) time each day     losartan (COZAAR) 25 MG tablet Take by mouth.     nitroGLYCERIN (NITROSTAT) 0.4 MG SL tablet PLACE 1 TABLET UNDER THE TONGUE EVERY 5 MINUTES AS NEEDED FOR CHEST PAIN. MAY TAKE UP TO 3 DOSES     Omega-3 Fatty Acids (FISH OIL) 1000 MG CAPS Take by mouth.     omeprazole (PRILOSEC) 20 MG capsule Take 1 capsule by mouth daily.     ranolazine (RANEXA) 500 MG 12 hr  tablet Take by mouth.     rosuvastatin (CRESTOR) 20 MG tablet Take by mouth.     temazepam (RESTORIL) 15 MG capsule Take by mouth.     torsemide (DEMADEX) 20 MG tablet Take by mouth.     furosemide (LASIX) 20 MG tablet Take by mouth. (Patient not taking: No sig reported)     hydrALAZINE (APRESOLINE) 50 MG tablet Take by mouth. (Patient not taking: No sig reported)     Tiotropium Bromide-Olodaterol 2.5-2.5 MCG/ACT AERS Inhale into the lungs. (Patient not taking: No sig reported)     No current facility-administered medications for this visit.    Past Medical History:  Diagnosis Date   Diabetes mellitus without complication ([place])    Hypertension     Past Surgical History:  Procedure Laterality Date   CATARACT EXTRACTION Left 2014   EYE SURGERY Left 07/22/2020   Dr. Towanda Octave, [facility]    Family History No bleeding disorders, clotting disorders, aneurysms, autoimmune diseases, or pophyria   Social History        Tobacco Use   Smoking status: Never Smoker   Smokeless tobacco: Never Used  no alcohol or drug abuse  Allergies  Allergen Reactions   Other Other (See Comments)    Tears skin Other reaction(s): Other (See Comments) Tears skin    REVIEW OF SYSTEMS (Negative unless checked)   Constitutional: [] Weight loss  [] Fever  [] Chills Cardiac: [] Chest pain   [] Chest pressure   [] Palpitations   [] Shortness of breath when laying flat   [] Shortness of breath at rest   [] Shortness of breath with exertion. Vascular:  [x] Pain in legs with walking   [] Pain in legs at rest   [] Pain in legs when laying flat   [x] Claudication   [] Pain in feet when walking  [] Pain in feet at rest  [] Pain in feet when laying flat   [] History of DVT   [] Phlebitis   [] Swelling in legs   [] Varicose veins   [] Non-healing ulcers Pulmonary:   [] Uses home oxygen   [] Productive cough   [] Hemoptysis   [] Wheeze  [] COPD   [] Asthma Neurologic:  [] Dizziness  [] Blackouts   [] Seizures   [] History of stroke    [] History of TIA  [] Aphasia   [] Temporary blindness   [] Dysphagia   [] Weakness or numbness in arms   [] Weakness or numbness in legs Musculoskeletal:  [x] Arthritis   [] Joint swelling   [x] Joint pain   [] Low back pain Hematologic:  [] Easy bruising  [] Easy bleeding   [] Hypercoagulable state   [] Anemic  [] Hepatitis Gastrointestinal:  [] Blood in stool   [] Vomiting blood  [] Gastroesophageal reflux/heartburn   [] Abdominal pain Genitourinary:  [x] Chronic kidney disease   [] Difficult urination  [] Frequent urination  [] Burning with urination   [] Hematuria Skin:  [] Rashes   [] Ulcers   [] Wounds Psychological:  [] History of anxiety   []  History of major depression.  Physical Examination  BP (!) 135/59 (BP Location: Right Arm)   Pulse (!) 51   Resp 14   Ht 5\' 3"  (1.6 m)   Wt 141 lb (64 kg)   BMI 24.98 kg/m  Gen:  WD/WN, NAD Head: [place]/AT, No temporalis wasting. Ear/Nose/Throat: Hearing grossly intact, nares w/o erythema or drainage Eyes: Conjunctiva clear. Sclera non-icteric Neck: Supple.  Trachea midline Pulmonary:  Good air movement, no use of accessory muscles.  Cardiac: RRR, no JVD Vascular:  Vessel Right Left  Radial Palpable Palpable                          PT 1+ Palpable 1+ Palpable  DP 2+ Palpable 1+ Palpable   Gastrointestinal: soft, non-tender/non-distended. No guarding/reflex.  Musculoskeletal: M/S 5/5 throughout.  No deformity or atrophy. No edema. Neurologic: Sensation grossly intact in extremities.  Symmetrical.  Speech is fluent.  Psychiatric: Judgment intact, Mood & affect appropriate for pt's clinical situation. Dermatologic: No rashes or ulcers noted.  No cellulitis or open wounds.      Labs Recent Results (from the past 2160 hour(s))  Folate     Status: None   Collection Time: 04/12/21  3:23 PM  Result Value Ref Range   Folate 7.4 >5.9 ng/mL    Comment: Performed at [facility], [address] [place], [place] [address]  Copper, serum     Status:  None   Collection Time: 04/12/21  3:23 PM  Result Value Ref Range   Copper 109 80 - 158 ug/dL    Comment: (NOTE) This test was developed and its performance characteristics determined by [facility]. It has not been cleared or approved by the Food and Drug Administration.  Detection Limit = 5 Performed At: [facility] [place], [place] [id number] Rush Farmer MD [id number]   Vitamin B12     Status: Abnormal   Collection Time: 04/12/21  3:23 PM  Result Value Ref Range   Vitamin B-12 2,761 (H) 180 - 914 pg/mL    Comment: (NOTE) This assay is not validated for testing neonatal or myeloproliferative syndrome specimens for Vitamin B12 levels. Performed at [facility], [place], [place], [place] [address]   Ferritin     Status: None   Collection Time: 04/12/21  3:23 PM  Result Value Ref Range   Ferritin 196 11 - 307 ng/mL    Comment: Performed at [facility], [address] [place], [place] [address]  Iron and TIBC     Status: Abnormal   Collection Time: 04/12/21  3:23 PM  Result Value Ref Range   Iron 46 28 - 170 ug/dL   TIBC 220 (L) 250 - 450 ug/dL   Saturation Ratios 21 10.4 - 31.8 %   UIBC 174 ug/dL    Comment: Performed at [facility], [address] [place], [place] [address]  Comprehensive metabolic panel     Status: Abnormal   Collection Time: 04/12/21  3:23 PM  Result Value Ref Range   Sodium 134 (L) 135 - 145 mmol/L   Potassium 5.0 3.5 - 5.1 mmol/L   Chloride 103 98 - 111 mmol/L   CO2 23 22 - 32 mmol/L   Glucose, Bld 114 (H) 70 - 99 mg/dL    Comment: Glucose reference range applies only to samples taken after fasting for at least 8 hours.   BUN 48 (H) 8 - 23 mg/dL   Creatinine, Ser 2.93 (H) 0.44 - 1.00 mg/dL   Calcium 8.6 (L) 8.9 - 10.3 mg/dL   Total Protein 7.4 6.5 - 8.1 g/dL   Albumin 3.1 (L) 3.5 - 5.0 g/dL   AST 15 15 - 41 U/L   ALT 10 0 - 44 U/L   Alkaline Phosphatase  105 38 - 126 U/L   Total Bilirubin 0.5 0.3 - 1.2 mg/dL   GFR, Estimated 16 (L) >60 mL/min    Comment: (NOTE) Calculated using the CKD-EPI Creatinine Equation (2021)    Anion gap 8 5 - 15    Comment: Performed at [facility], [address] [place], [place] [address]  CBC with Differential/Platelet     Status: Abnormal   Collection Time: 04/12/21  3:23 PM  Result Value Ref Range   WBC 5.8 4.0 - 10.5 K/uL   RBC 2.28 (L) 3.87 - 5.11 MIL/uL   Hemoglobin 7.8 (L) 12.0 - 15.0 g/dL   HCT 23.5 (L) 36.0 - 46.0 %   MCV 103.1 (H) 80.0 - 100.0 fL   MCH 34.2 (H) 26.0 - 34.0 pg   MCHC 33.2 30.0 - 36.0 g/dL   RDW 19.9 (H) 11.5 - 15.5 %   Platelets 499 (H) 150 - 400 K/uL   nRBC 0.0 0.0 - 0.2 %   Neutrophils Relative % 77 %   Neutro Abs 4.5 1.7 - 7.7 K/uL   Lymphocytes Relative 14 %   Lymphs Abs 0.8 0.7 - 4.0 K/uL   Monocytes Relative 8 %   Monocytes Absolute 0.4 0.1 - 1.0 K/uL   Eosinophils Relative 0 %   Eosinophils Absolute 0.0 0.0 - 0.5 K/uL   Basophils Relative 0 %   Basophils Absolute 0.0 0.0 - 0.1 K/uL  Immature Granulocytes 1 %   Abs Immature Granulocytes 0.03 0.00 - 0.07 K/uL    Comment: Performed at [facility], [address] [place], [place] [address]  Prepare RBC (crossmatch)     Status: None   Collection Time: 04/12/21  3:23 PM  Result Value Ref Range   Order Confirmation      ORDER PROCESSED BY BLOOD BANK Performed at [facility], [address] [place], [place] [address]   Type and screen     Status: None   Collection Time: 04/12/21  3:24 PM  Result Value Ref Range   ABO/RH(D) O POS    Antibody Screen NEG    Sample Expiration 04/15/2021,2359    Unit Number [id number]    Blood Component Type RBC LR PHER1    Unit division 00    Status of Unit ISSUED,FINAL    Transfusion Status OK TO TRANSFUSE    Crossmatch Result      Compatible Performed at [facility], [address] [place], [place] [address]   BPAM RBC      Status: None   Collection Time: 04/12/21  3:24 PM  Result Value Ref Range   ISSUE DATE / TIME [id number]    Blood Product Unit Number [id number]    PRODUCT CODE [id number]    Unit Type and Rh 5100    Blood Product Expiration Date 003704888916   ABO/Rh     Status: None   Collection Time: 04/12/21  3:32 PM  Result Value Ref Range   ABO/RH(D)      O POS Performed at [facility], [address] [place], [place] [address]   Ferritin     Status: None   Collection Time: 04/19/21 10:46 AM  Result Value Ref Range   Ferritin 200 11 - 307 ng/mL    Comment: Performed at [facility], [address] [place], [place] [address]  Comprehensive metabolic panel     Status: Abnormal   Collection Time: 04/19/21 10:46 AM  Result Value Ref Range   Sodium 135 135 - 145 mmol/L   Potassium 5.5 (H) 3.5 - 5.1 mmol/L   Chloride 105 98 - 111 mmol/L   CO2 23 22 - 32 mmol/L   Glucose, Bld 114 (H) 70 - 99 mg/dL    Comment: Glucose reference range applies only to samples taken after fasting for at least 8 hours.   BUN 50 (H) 8 - 23 mg/dL   Creatinine, Ser 3.08 (H) 0.44 - 1.00 mg/dL   Calcium 8.8 (L) 8.9 - 10.3 mg/dL   Total Protein 6.8 6.5 - 8.1 g/dL   Albumin 3.0 (L) 3.5 - 5.0 g/dL   AST 16 15 - 41 U/L   ALT 9 0 - 44 U/L   Alkaline Phosphatase 96 38 - 126 U/L   Total Bilirubin 0.8 0.3 - 1.2 mg/dL   GFR, Estimated 15 (L) >60 mL/min    Comment: (NOTE) Calculated using the CKD-EPI Creatinine Equation (2021)    Anion gap 7 5 - 15    Comment: Performed at [facility], [address] [place],  [address]  CBC with Differential     Status: Abnormal   Collection Time: 04/19/21 10:46 AM  Result Value Ref Range   WBC 4.9 4.0 - 10.5 K/uL   RBC 2.56 (L) 3.87 - 5.11 MIL/uL   Hemoglobin 8.2 (L) 12.0 - 15.0 g/dL   HCT 25.3 (L) 36.0 - 46.0 %   MCV  98.8 80.0 - 100.0 fL   MCH 32.0 26.0 - 34.0 pg   MCHC 32.4 30.0 - 36.0 g/dL   RDW 20.2 (H) 11.5 - 15.5 %    Platelets 356 150 - 400 K/uL   nRBC 0.0 0.0 - 0.2 %   Neutrophils Relative % 82 %   Neutro Abs 4.0 1.7 - 7.7 K/uL   Lymphocytes Relative 14 %   Lymphs Abs 0.7 0.7 - 4.0 K/uL   Monocytes Relative 4 %   Monocytes Absolute 0.2 0.1 - 1.0 K/uL   Eosinophils Relative 0 %   Eosinophils Absolute 0.0 0.0 - 0.5 K/uL   Basophils Relative 0 %   Basophils Absolute 0.0 0.0 - 0.1 K/uL   Immature Granulocytes 0 %   Abs Immature Granulocytes 0.02 0.00 - 0.07 K/uL    Comment: Performed at [facility], [address] [place], [place] [address]  Comprehensive metabolic panel     Status: Abnormal   Collection Time: 04/25/21 10:14 AM  Result Value Ref Range   Sodium 134 (L) 135 - 145 mmol/L   Potassium 5.0 3.5 - 5.1 mmol/L   Chloride 104 98 - 111 mmol/L   CO2 20 (L) 22 - 32 mmol/L   Glucose, Bld 188 (H) 70 - 99 mg/dL    Comment: Glucose reference range applies only to samples taken after fasting for at least 8 hours.   BUN 51 (H) 8 - 23 mg/dL   Creatinine, Ser 3.32 (H) 0.44 - 1.00 mg/dL   Calcium 8.7 (L) 8.9 - 10.3 mg/dL   Total Protein 6.9 6.5 - 8.1 g/dL   Albumin 3.4 (L) 3.5 - 5.0 g/dL   AST 20 15 - 41 U/L   ALT 10 0 - 44 U/L   Alkaline Phosphatase 109 38 - 126 U/L   Total Bilirubin 1.1 0.3 - 1.2 mg/dL   GFR, Estimated 14 (L) >60 mL/min    Comment: (NOTE) Calculated using the CKD-EPI Creatinine Equation (2021)    Anion gap 10 5 - 15    Comment: Performed at [facility], [address] [place], [place] [address]  CBC with Differential/Platelet     Status: Abnormal   Collection Time: 04/25/21 10:14 AM  Result Value Ref Range   WBC 3.6 (L) 4.0 - 10.5 K/uL   RBC 2.44 (L) 3.87 - 5.11 MIL/uL   Hemoglobin 7.9 (L) 12.0 - 15.0 g/dL   HCT 24.6 (L) 36.0 - 46.0 %   MCV 100.8 (H) 80.0 - 100.0 fL   MCH 32.4 26.0 - 34.0 pg   MCHC 32.1 30.0 - 36.0 g/dL   RDW 20.7 (H) 11.5 - 15.5 %   Platelets 258 150 - 400 K/uL   nRBC 0.0 0.0 - 0.2 %   Neutrophils Relative % 75 %    Neutro Abs 2.7 1.7 - 7.7 K/uL   Lymphocytes Relative 18 %   Lymphs Abs 0.6 (L) 0.7 - 4.0 K/uL   Monocytes Relative 5 %   Monocytes Absolute 0.2 0.1 - 1.0 K/uL   Eosinophils Relative 0 %   Eosinophils Absolute 0.0 0.0 - 0.5 K/uL   Basophils Relative 1 %   Basophils Absolute 0.0 0.0 - 0.1 K/uL   Immature Granulocytes 1 %   Abs Immature Granulocytes 0.02 0.00 - 0.07 K/uL    Comment: Performed at [facility], [address], [place],  [address]  Comprehensive metabolic panel     Status: Abnormal   Collection Time:  04/28/21  1:43 PM  Result Value Ref Range   Sodium 135 135 - 145 mmol/L   Potassium 4.9 3.5 - 5.1 mmol/L   Chloride 106 98 - 111 mmol/L   CO2 20 (L) 22 - 32 mmol/L   Glucose, Bld 101 (H) 70 - 99 mg/dL    Comment: Glucose reference range applies only to samples taken after fasting for at least 8 hours.   BUN 56 (H) 8 - 23 mg/dL   Creatinine, Ser 3.01 (H) 0.44 - 1.00 mg/dL   Calcium 8.4 (L) 8.9 - 10.3 mg/dL   Total Protein 6.9 6.5 - 8.1 g/dL   Albumin 3.3 (L) 3.5 - 5.0 g/dL   AST 18 15 - 41 U/L   ALT 12 0 - 44 U/L   Alkaline Phosphatase 114 38 - 126 U/L   Total Bilirubin 0.8 0.3 - 1.2 mg/dL   GFR, Estimated 16 (L) >60 mL/min    Comment: (NOTE) Calculated using the CKD-EPI Creatinine Equation (2021)    Anion gap 9 5 - 15    Comment: Performed at [facility], [address] [place], [place] [address]  CBC with Differential     Status: Abnormal   Collection Time: 04/28/21  1:43 PM  Result Value Ref Range   WBC 3.3 (L) 4.0 - 10.5 K/uL   RBC 2.34 (L) 3.87 - 5.11 MIL/uL   Hemoglobin 7.8 (L) 12.0 - 15.0 g/dL   HCT 23.6 (L) 36.0 - 46.0 %   MCV 100.9 (H) 80.0 - 100.0 fL   MCH 33.3 26.0 - 34.0 pg   MCHC 33.1 30.0 - 36.0 g/dL   RDW 21.2 (H) 11.5 - 15.5 %   Platelets 234 150 - 400 K/uL   nRBC 0.0 0.0 - 0.2 %   Neutrophils Relative % 69 %   Neutro Abs 2.3 1.7 - 7.7 K/uL   Lymphocytes Relative 23 %   Lymphs Abs 0.7 0.7 - 4.0 K/uL    Monocytes Relative 6 %   Monocytes Absolute 0.2 0.1 - 1.0 K/uL   Eosinophils Relative 1 %   Eosinophils Absolute 0.0 0.0 - 0.5 K/uL   Basophils Relative 0 %   Basophils Absolute 0.0 0.0 - 0.1 K/uL   Immature Granulocytes 1 %   Abs Immature Granulocytes 0.02 0.00 - 0.07 K/uL    Comment: Performed at [facility], [address] [place], [place] [address]  Ferritin     Status: None   Collection Time: 04/28/21  1:43 PM  Result Value Ref Range   Ferritin 305 11 - 307 ng/mL    Comment: Performed at [facility], [address] [place], [place] [address]  Iron and TIBC     Status: Abnormal   Collection Time: 04/28/21  1:43 PM  Result Value Ref Range   Iron 59 28 - 170 ug/dL   TIBC 244 (L) 250 - 450 ug/dL   Saturation Ratios 24 10.4 - 31.8 %   UIBC 185 ug/dL    Comment: Performed at [facility], [address] [place], [place] [address]  Comprehensive metabolic panel     Status: Abnormal   Collection Time: 05/03/21  1:50 PM  Result Value Ref Range   Sodium 137 135 - 145 mmol/L   Potassium 4.8 3.5 - 5.1 mmol/L   Chloride 108 98 - 111 mmol/L   CO2 21 (L) 22 - 32 mmol/L   Glucose, Bld 138 (H) 70 - 99 mg/dL  Comment: Glucose reference range applies only to samples taken after fasting for at least 8 hours.   BUN 49 (H) 8 - 23 mg/dL   Creatinine, Ser 3.07 (H) 0.44 - 1.00 mg/dL   Calcium 8.4 (L) 8.9 - 10.3 mg/dL   Total Protein 6.8 6.5 - 8.1 g/dL   Albumin 3.4 (L) 3.5 - 5.0 g/dL   AST 21 15 - 41 U/L   ALT 13 0 - 44 U/L   Alkaline Phosphatase 99 38 - 126 U/L   Total Bilirubin 0.7 0.3 - 1.2 mg/dL   GFR, Estimated 15 (L) >60 mL/min    Comment: (NOTE) Calculated using the CKD-EPI Creatinine Equation (2021)    Anion gap 8 5 - 15    Comment: Performed at [facility], [address], [place], [place] [address]  CBC with Differential/Platelet     Status: Abnormal   Collection Time: 05/03/21  1:50 PM  Result Value Ref Range   WBC 2.9 (L)  4.0 - 10.5 K/uL   RBC 2.31 (L) 3.87 - 5.11 MIL/uL   Hemoglobin 7.8 (L) 12.0 - 15.0 g/dL   HCT 23.7 (L) 36.0 - 46.0 %   MCV 102.6 (H) 80.0 - 100.0 fL   MCH 33.8 26.0 - 34.0 pg   MCHC 32.9 30.0 - 36.0 g/dL   RDW 21.8 (H) 11.5 - 15.5 %   Platelets 265 150 - 400 K/uL   nRBC 0.7 (H) 0.0 - 0.2 %   Neutrophils Relative % 69 %   Neutro Abs 2.0 1.7 - 7.7 K/uL   Lymphocytes Relative 22 %   Lymphs Abs 0.6 (L) 0.7 - 4.0 K/uL   Monocytes Relative 8 %   Monocytes Absolute 0.2 0.1 - 1.0 K/uL   Eosinophils Relative 0 %   Eosinophils Absolute 0.0 0.0 - 0.5 K/uL   Basophils Relative 1 %   Basophils Absolute 0.0 0.0 - 0.1 K/uL   Immature Granulocytes 0 %   Abs Immature Granulocytes 0.01 0.00 - 0.07 K/uL    Comment: Performed at [facility], [address], [place], [place] [address]  Comprehensive metabolic panel     Status: Abnormal   Collection Time: 05/05/21  2:25 PM  Result Value Ref Range   Sodium 135 135 - 145 mmol/L   Potassium 4.5 3.5 - 5.1 mmol/L   Chloride 108 98 - 111 mmol/L   CO2 20 (L) 22 - 32 mmol/L   Glucose, Bld 146 (H) 70 - 99 mg/dL    Comment: Glucose reference range applies only to samples taken after fasting for at least 8 hours.   BUN 57 (H) 8 - 23 mg/dL   Creatinine, Ser 2.99 (H) 0.44 - 1.00 mg/dL   Calcium 8.3 (L) 8.9 - 10.3 mg/dL   Total Protein 6.7 6.5 - 8.1 g/dL   Albumin 3.4 (L) 3.5 - 5.0 g/dL   AST 20 15 - 41 U/L   ALT 12 0 - 44 U/L   Alkaline Phosphatase 108 38 - 126 U/L   Total Bilirubin 0.7 0.3 - 1.2 mg/dL   GFR, Estimated 16 (L) >60 mL/min    Comment: (NOTE) Calculated using the CKD-EPI Creatinine Equation (2021)    Anion gap 7 5 - 15    Comment: Performed at [facility], [address], [place], [place] [address]  CBC with Differential/Platelet     Status: Abnormal   Collection Time: 05/05/21  2:25 PM  Result Value Ref Range   WBC  2.6 (L) 4.0 - 10.5 K/uL   RBC 2.22 (L) 3.87 - 5.11 MIL/uL   Hemoglobin 7.4 (L) 12.0 - 15.0  g/dL   HCT 22.9 (L) 36.0 - 46.0 %   MCV 103.2 (H) 80.0 - 100.0 fL   MCH 33.3 26.0 - 34.0 pg   MCHC 32.3 30.0 - 36.0 g/dL   RDW 22.3 (H) 11.5 - 15.5 %   Platelets 271 150 - 400 K/uL   nRBC 0.0 0.0 - 0.2 %   Neutrophils Relative % 63 %   Neutro Abs 1.6 (L) 1.7 - 7.7 K/uL   Lymphocytes Relative 29 %   Lymphs Abs 0.8 0.7 - 4.0 K/uL   Monocytes Relative 8 %   Monocytes Absolute 0.2 0.1 - 1.0 K/uL   Eosinophils Relative 0 %   Eosinophils Absolute 0.0 0.0 - 0.5 K/uL   Basophils Relative 0 %   Basophils Absolute 0.0 0.0 - 0.1 K/uL   Immature Granulocytes 0 %   Abs Immature Granulocytes 0.01 0.00 - 0.07 K/uL    Comment: Performed at [facility], [address], [place], [place] [address]  Retic Panel     Status: Abnormal   Collection Time: 05/05/21  3:32 PM  Result Value Ref Range   Retic Ct Pct 2.2 0.4 - 3.1 %   RBC. 2.24 (L) 3.87 - 5.11 MIL/uL   Retic Count, Absolute 48.8 19.0 - 186.0 K/uL   Immature Retic Fract 21.3 (H) 2.3 - 15.9 %   Reticulocyte Hemoglobin 40.7 >27.9 pg    Comment:        Given the high negative predictive value of a RET-He result > 32 pg iron deficiency is essentially excluded. If this patient is anemic other etiologies should be considered. Performed at [facility], [address] [place], [place] [address]   Multiple Myeloma Panel (SPEP&IFE w/QIG)     Status: Abnormal   Collection Time: 05/05/21  3:32 PM  Result Value Ref Range   IgG (Immunoglobin G), Serum 955 586 - 1,602 mg/dL   IgA 101 64 - 422 mg/dL   IgM (Immunoglobulin M), Srm 217 26 - 217 mg/dL   Total Protein ELP 6.0 6.0 - 8.5 g/dL   Albumin SerPl Elph-Mcnc 3.5 2.9 - 4.4 g/dL   Alpha 1 0.3 0.0 - 0.4 g/dL   Alpha2 Glob SerPl Elph-Mcnc 0.6 0.4 - 1.0 g/dL   B-Globulin SerPl Elph-Mcnc 0.7 0.7 - 1.3 g/dL   Gamma Glob SerPl Elph-Mcnc 0.9 0.4 - 1.8 g/dL   M Protein SerPl Elph-Mcnc Not Observed Not Observed g/dL   Globulin, Total 2.5 2.2 - 3.9 g/dL   Albumin/Glob SerPl 1.5  0.7 - 1.7   IFE 1 Comment (A)     Comment: Polyclonal increase detected in one or more immunoglobulins.   Please Note Comment     Comment: (NOTE) Protein electrophoresis scan will follow via computer, mail, or courier delivery. Performed At: [facility] [place], [place] [id number] Rush Farmer MD [id number]   Kappa/lambda light chains     Status: Abnormal   Collection Time: 05/05/21  3:32 PM  Result Value Ref Range   Kappa free light chain 121.5 (H) 3.3 - 19.4 mg/L   Lambda free light chains 74.0 (H) 5.7 - 26.3 mg/L   Kappa, lambda light chain ratio 1.64 0.26 - 1.65    Comment: (NOTE) Performed At: [facility] [place], [place] [id number] Rush Farmer MD [id number]   Flow cytometry panel-leukemia/lymphoma  work-up     Status: None   Collection Time: 05/11/21  1:46 PM  Result Value Ref Range   PATH INTERP XXX-IMP Comment     Comment: No significant immunophenotypic abnormality detected   ANNOTATION COMMENT IMP Comment     Comment: Recommend clinical correlation and follow up as appropriate.   CLINICAL INFO Comment     Comment: (NOTE) Anemia Accompanying CBC dated 05-11-21 shows: WBC count 2.9, Hgb 8.3, Neu 2.0, Lym 0.7, Mon 0.3.    Specimen Type Comment     Comment: Peripheral blood   ASSESSMENT OF LEUKOCYTES Comment     Comment: (NOTE) No monoclonal B cell population is detected. kappa:lambda ratio 1.2 There is no loss of, or aberrant expression of, the pan T cell antigens to suggest a neoplastic T cell process. CD4:CD8 ratio 2.0 No circulating blasts are detected. There is no immunophenotypic evidence of abnormal myeloid maturation. Rare neutrophils show slightly left-shifted maturation. Rare monocytes show partial dim aberrant expression of CD56, a nonspecific finding that can be seen in association with both reactive/activated processes as well as neoplastic processes. Analysis of the leukocyte population  shows: granulocytes 43%, monocytes 8%, lymphocytes 49%, blasts <0.1%, B cells 11%, T cells 35%, NK cells 3%.    % Viable Cells Comment     Comment: 90%   ANALYSIS AND GATING STRATEGY Comment     Comment: 8 color analysis with CD45/SSC   IMMUNOPHENOTYPING STUDY Comment     Comment: (NOTE) CD2       Normal         CD3       Normal CD4       Normal         CD5       Normal CD7       Normal         CD8       Normal CD10      Normal         CD11b     Normal CD13      Normal         CD14      Normal CD16      Normal         CD19      Normal CD20      Normal         CD33      Normal CD34      Normal         CD38      Normal CD45      Normal         CD56      See Text CD57      Normal         CD117     Normal HLA-DR    Normal         KAPPA     Normal LAMBDA    Normal         CD64      Normal    PATHOLOGIST NAME Comment     Comment: Cecilie Kicks, M. D.   COMMENT: Comment     Comment: (NOTE) Each antibody in this assay was utilized to assess for potential abnormalities of studied cell populations or to characterize identified abnormalities. This test was developed and its performance characteristics determined by [facility].  It has not been cleared or approved by the U.S. Food and Drug Administration. The FDA has determined that such clearance or approval  is not necessary. This test is used for clinical purposes.  It should not be regarded as investigational or for research. Performed At: -[facility] [address] [place], [place] [id number] Katina Degree MDPhD [id number] Performed At: [facility] [address] [place], [place] [id number] Katina Degree MDPhD [id number]   Lactate dehydrogenase     Status: Abnormal   Collection Time: 05/11/21  1:46 PM  Result Value Ref Range   LDH 200 (H) 98 - 192 U/L    Comment: Performed at [facility], [address] [place], [place] [address]  TSH     Status: None   Collection Time: 05/11/21  1:46 PM  Result  Value Ref Range   TSH 1.005 0.350 - 4.500 uIU/mL    Comment: Performed by a 3rd Generation assay with a functional sensitivity of <=0.01 uIU/mL. Performed at [facility], [address] [place], [place] [address]   Sample to Blood Bank     Status: None   Collection Time: 05/11/21  1:46 PM  Result Value Ref Range   Blood Bank Specimen SAMPLE AVAILABLE FOR TESTING    Sample Expiration      05/14/2021,2359 Performed at [facility], [address] [place], [place] [address]   CBC with Differential/Platelet     Status: Abnormal   Collection Time: 05/11/21  1:46 PM  Result Value Ref Range   WBC 2.9 (L) 4.0 - 10.5 K/uL   RBC 2.42 (L) 3.87 - 5.11 MIL/uL   Hemoglobin 8.3 (L) 12.0 - 15.0 g/dL   HCT 25.3 (L) 36.0 - 46.0 %   MCV 104.5 (H) 80.0 - 100.0 fL   MCH 34.3 (H) 26.0 - 34.0 pg   MCHC 32.8 30.0 - 36.0 g/dL   RDW 22.7 (H) 11.5 - 15.5 %   Platelets 350 150 - 400 K/uL   nRBC 0.0 0.0 - 0.2 %   Neutrophils Relative % 66 %   Neutro Abs 2.0 1.7 - 7.7 K/uL   Lymphocytes Relative 23 %   Lymphs Abs 0.7 0.7 - 4.0 K/uL   Monocytes Relative 9 %   Monocytes Absolute 0.3 0.1 - 1.0 K/uL   Eosinophils Relative 0 %   Eosinophils Absolute 0.0 0.0 - 0.5 K/uL   Basophils Relative 1 %   Basophils Absolute 0.0 0.0 - 0.1 K/uL   Immature Granulocytes 1 %   Abs Immature Granulocytes 0.02 0.00 - 0.07 K/uL    Comment: Performed at [facility], [address], [place], [place] [address]  Pathologist smear review     Status: None   Collection Time: 05/11/21  1:46 PM  Result Value Ref Range   Path Review Blood smear is reviewed.     Comment: Patient with macrocytic anemia, thought to be multifactorial including chronic kidney disease. Undergoing hematology evaluation. Macrocytic anemia. Unremarkable RBC, WBC, and platelet morphology. Additional workup as per Dr. Tasia Catchings. Reviewed by Dellia Nims. Reuel Derby, M.D. Performed at [facility], [address]  [place], [place] [address]   CBC with Differential/Platelet     Status: Abnormal   Collection Time: 05/20/21  1:28 PM  Result Value Ref Range   WBC 3.8 (L) 4.0 - 10.5 K/uL   RBC 2.26 (L) 3.87 - 5.11 MIL/uL   Hemoglobin 7.8 (L) 12.0 - 15.0 g/dL   HCT 23.5 (L) 36.0 - 46.0 %   MCV 104.0 (H) 80.0 - 100.0 fL   MCH 34.5 (H) 26.0 -  34.0 pg   MCHC 33.2 30.0 - 36.0 g/dL   RDW 23.0 (H) 11.5 - 15.5 %   Platelets 206 150 - 400 K/uL   nRBC 0.0 0.0 - 0.2 %   Neutrophils Relative % 64 %   Neutro Abs 2.4 1.7 - 7.7 K/uL   Lymphocytes Relative 25 %   Lymphs Abs 1.0 0.7 - 4.0 K/uL   Monocytes Relative 9 %   Monocytes Absolute 0.3 0.1 - 1.0 K/uL   Eosinophils Relative 0 %   Eosinophils Absolute 0.0 0.0 - 0.5 K/uL   Basophils Relative 1 %   Basophils Absolute 0.0 0.0 - 0.1 K/uL   Immature Granulocytes 1 %   Abs Immature Granulocytes 0.02 0.00 - 0.07 K/uL    Comment: Performed at [facility], [address], [place], [place] [address]  CBC with Differential/Platelet     Status: Abnormal   Collection Time: 06/16/21  2:44 PM  Result Value Ref Range   WBC 6.6 4.0 - 10.5 K/uL   RBC 2.41 (L) 3.87 - 5.11 MIL/uL   Hemoglobin 8.5 (L) 12.0 - 15.0 g/dL   HCT 25.3 (L) 36.0 - 46.0 %   MCV 105.0 (H) 80.0 - 100.0 fL   MCH 35.3 (H) 26.0 - 34.0 pg   MCHC 33.6 30.0 - 36.0 g/dL   RDW 17.2 (H) 11.5 - 15.5 %   Platelets 197 150 - 400 K/uL   nRBC 0.0 0.0 - 0.2 %   Neutrophils Relative % 79 %   Neutro Abs 5.2 1.7 - 7.7 K/uL   Lymphocytes Relative 14 %   Lymphs Abs 0.9 0.7 - 4.0 K/uL   Monocytes Relative 6 %   Monocytes Absolute 0.4 0.1 - 1.0 K/uL   Eosinophils Relative 0 %   Eosinophils Absolute 0.0 0.0 - 0.5 K/uL   Basophils Relative 0 %   Basophils Absolute 0.0 0.0 - 0.1 K/uL   Immature Granulocytes 1 %   Abs Immature Granulocytes 0.03 0.00 - 0.07 K/uL    Comment: Performed at [facility], [address] [place], [place] [address]    Radiology No results  found.  Assessment/Plan Chronic kidney disease, stage 4 (severe) (HCC) She has fairly severe chronic kidney disease and contrast for intervention is certainly a concern for worsening her renal dysfunction.  As long she does not have limb threatening symptoms, she prefers medical management which I am in agreement with.   Type 2 diabetes mellitus (HCC) blood glucose control important in reducing the progression of atherosclerotic disease. Also, involved in wound healing. On appropriate medications.     Essential hypertension blood pressure control important in reducing the progression of atherosclerotic disease. On appropriate oral medications.  Atherosclerosis of native arteries of extremity with intermittent claudication (HCC) ABI today is 0.95 on the right and 0.72 on the left.  Has stable claudication symptoms without limb threatening symptoms.  With her severe chronic kidney disease, I would not recommend any intervention unless she had limb threatening symptoms and she does not really want any intervention.  At this point, I have offered her continued follow-up and I will plan to see her in 1 year with noninvasive studies    Leotis Pain, MD  06/24/2021 11:57 AM    This note was created with Dragon medical transcription system.  Any errors from dictation are purely unintentional

## 2021-06-30 ENCOUNTER — Inpatient Hospital Stay: Payer: Medicare PPO

## 2021-06-30 ENCOUNTER — Other Ambulatory Visit: Payer: Self-pay

## 2021-06-30 ENCOUNTER — Inpatient Hospital Stay: Payer: Medicare PPO | Attending: Oncology

## 2021-06-30 VITALS — BP 145/65 | HR 48 | Temp 96.0°F | Resp 18

## 2021-06-30 DIAGNOSIS — N184 Chronic kidney disease, stage 4 (severe): Secondary | ICD-10-CM | POA: Diagnosis not present

## 2021-06-30 DIAGNOSIS — D631 Anemia in chronic kidney disease: Secondary | ICD-10-CM | POA: Insufficient documentation

## 2021-06-30 DIAGNOSIS — N189 Chronic kidney disease, unspecified: Secondary | ICD-10-CM

## 2021-06-30 LAB — HEMOGLOBIN AND HEMATOCRIT, BLOOD
HCT: 27.4 % — ABNORMAL LOW (ref 36.0–46.0)
Hemoglobin: 9 g/dL — ABNORMAL LOW (ref 12.0–15.0)

## 2021-06-30 MED ORDER — EPOETIN ALFA-EPBX 10000 UNIT/ML IJ SOLN
10000.0000 [IU] | Freq: Once | INTRAMUSCULAR | Status: AC
  Administered 2021-06-30: 10000 [IU] via SUBCUTANEOUS

## 2021-07-13 ENCOUNTER — Other Ambulatory Visit: Payer: Self-pay | Admitting: Family Medicine

## 2021-07-13 ENCOUNTER — Other Ambulatory Visit (HOSPITAL_COMMUNITY): Payer: Self-pay | Admitting: Family Medicine

## 2021-07-13 DIAGNOSIS — I6521 Occlusion and stenosis of right carotid artery: Secondary | ICD-10-CM

## 2021-07-14 ENCOUNTER — Other Ambulatory Visit: Payer: Self-pay

## 2021-07-14 ENCOUNTER — Inpatient Hospital Stay: Payer: Medicare PPO

## 2021-07-14 VITALS — BP 119/64 | HR 42 | Temp 96.5°F | Resp 18

## 2021-07-14 DIAGNOSIS — D631 Anemia in chronic kidney disease: Secondary | ICD-10-CM

## 2021-07-14 DIAGNOSIS — N189 Chronic kidney disease, unspecified: Secondary | ICD-10-CM

## 2021-07-14 DIAGNOSIS — N184 Chronic kidney disease, stage 4 (severe): Secondary | ICD-10-CM | POA: Diagnosis not present

## 2021-07-14 LAB — HEMOGLOBIN AND HEMATOCRIT, BLOOD
HCT: 28.3 % — ABNORMAL LOW (ref 36.0–46.0)
Hemoglobin: 9.3 g/dL — ABNORMAL LOW (ref 12.0–15.0)

## 2021-07-14 MED ORDER — EPOETIN ALFA-EPBX 10000 UNIT/ML IJ SOLN
10000.0000 [IU] | Freq: Once | INTRAMUSCULAR | Status: AC
  Administered 2021-07-14: 10000 [IU] via SUBCUTANEOUS

## 2021-07-20 ENCOUNTER — Other Ambulatory Visit (INDEPENDENT_AMBULATORY_CARE_PROVIDER_SITE_OTHER): Payer: Self-pay | Admitting: Nurse Practitioner

## 2021-07-20 DIAGNOSIS — N184 Chronic kidney disease, stage 4 (severe): Secondary | ICD-10-CM

## 2021-07-21 ENCOUNTER — Encounter (INDEPENDENT_AMBULATORY_CARE_PROVIDER_SITE_OTHER): Payer: Self-pay | Admitting: Ophthalmology

## 2021-07-21 ENCOUNTER — Ambulatory Visit (INDEPENDENT_AMBULATORY_CARE_PROVIDER_SITE_OTHER): Payer: Medicare PPO | Admitting: Ophthalmology

## 2021-07-21 ENCOUNTER — Other Ambulatory Visit: Payer: Self-pay

## 2021-07-21 DIAGNOSIS — H35351 Cystoid macular degeneration, right eye: Secondary | ICD-10-CM

## 2021-07-21 DIAGNOSIS — E113393 Type 2 diabetes mellitus with moderate nonproliferative diabetic retinopathy without macular edema, bilateral: Secondary | ICD-10-CM | POA: Diagnosis not present

## 2021-07-21 DIAGNOSIS — H35353 Cystoid macular degeneration, bilateral: Secondary | ICD-10-CM

## 2021-07-21 DIAGNOSIS — E113213 Type 2 diabetes mellitus with mild nonproliferative diabetic retinopathy with macular edema, bilateral: Secondary | ICD-10-CM

## 2021-07-21 DIAGNOSIS — H35352 Cystoid macular degeneration, left eye: Secondary | ICD-10-CM

## 2021-07-21 NOTE — Assessment & Plan Note (Signed)
Bilateral minor macular edema, overall likely because of the ongoing patient's uremia.  This minor CME from underlying uremia is likely improve once plan dialysis commences in the future in the coming months.

## 2021-07-21 NOTE — Progress Notes (Signed)
07/21/2021     CHIEF COMPLAINT Patient presents for Retina Follow Up (4 Mos fu ou oct/Patient states vision is stable and unchanged since last visit. Denies any new floaters or FOL./A1C 6.1, Does not check BS)   HISTORY OF PRESENT ILLNESS: Alexandria Moody is a 75 y.o. female who presents to the clinic today for:   HPI     Retina Follow Up           Diagnosis: Diabetic Retinopathy   Laterality: both eyes   Onset: 4 months ago   Severity: moderate   Duration: 4 months   Course: stable   Comments: 4 Mos fu ou oct Patient states vision is stable and unchanged since last visit. Denies any new floaters or FOL. A1C 6.1, Does not check BS       Last edited by Laurin Coder, COA on 07/21/2021 10:29 AM.      Referring physician: Ezequiel Kayser, MD [facility] [place],   [address]  HISTORICAL INFORMATION:   Selected notes from the MEDICAL RECORD NUMBER       CURRENT MEDICATIONS: No current outpatient medications on file. (Ophthalmic Drugs)   No current facility-administered medications for this visit. (Ophthalmic Drugs)   Current Outpatient Medications (Other)  Medication Sig   acetaminophen (TYLENOL) 325 MG tablet Take by mouth.   albuterol (VENTOLIN HFA) 108 (90 Base) MCG/ACT inhaler Inhale into the lungs.   amLODipine (NORVASC) 5 MG tablet Take 1 tablet by mouth 2 (two) times daily.   aspirin 81 MG EC tablet Take by mouth.   azaTHIOprine (IMURAN) 50 MG tablet Take 1 tablet by mouth 2 (two) times daily.   calcitRIOL (ROCALTROL) 0.25 MCG capsule Take by mouth.   Cholecalciferol 125 MCG (5000 UT) capsule Take by mouth.   citalopram (CELEXA) 20 MG tablet Take by mouth.   furosemide (LASIX) 20 MG tablet Take by mouth. (Patient not taking: No sig reported)   hydrALAZINE (APRESOLINE) 50 MG tablet Take by mouth. (Patient not taking: No sig reported)   insulin glargine, 1 Unit Dial, (TOUJEO SOLOSTAR) 300 UNIT/ML Solostar Pen Inject 32 Units into  the skin daily.   Insulin Pen Needle (BD PEN NEEDLE NANO 2ND GEN) 32G X 4 MM MISC daily.   ipratropium (ATROVENT) 0.06 % nasal spray Place into the nose.   isosorbide mononitrate (IMDUR) 120 MG 24 hr tablet Take by mouth.   levothyroxine (SYNTHROID) 75 MCG tablet Take by mouth.   linagliptin (TRADJENTA) 5 MG TABS tablet 1 (one) time each day   losartan (COZAAR) 25 MG tablet Take by mouth.   nitroGLYCERIN (NITROSTAT) 0.4 MG SL tablet PLACE 1 TABLET UNDER THE TONGUE EVERY 5 MINUTES AS NEEDED FOR CHEST PAIN. MAY TAKE UP TO 3 DOSES   Omega-3 Fatty Acids (FISH OIL) 1000 MG CAPS Take by mouth.   omeprazole (PRILOSEC) 20 MG capsule Take 1 capsule by mouth daily.   ranolazine (RANEXA) 500 MG 12 hr tablet Take by mouth.   rosuvastatin (CRESTOR) 20 MG tablet Take by mouth.   temazepam (RESTORIL) 15 MG capsule Take by mouth.   Tiotropium Bromide-Olodaterol 2.5-2.5 MCG/ACT AERS Inhale into the lungs. (Patient not taking: No sig reported)   torsemide (DEMADEX) 20 MG tablet Take by mouth.   No current facility-administered medications for this visit. (Other)      REVIEW OF SYSTEMS:    ALLERGIES Allergies  Allergen Reactions   Other Other (See Comments)    Tears skin Other reaction(s):  Other (See Comments) Tears skin    PAST MEDICAL HISTORY Past Medical History:  Diagnosis Date   Diabetes mellitus without complication ([place])    Hypertension    Past Surgical History:  Procedure Laterality Date   CATARACT EXTRACTION Left 2014   EYE SURGERY Left 07/22/2020   Dr. Towanda Octave, [facility]    FAMILY HISTORY History reviewed. No pertinent family history.  SOCIAL HISTORY Social History   Tobacco Use   Smoking status: Never   Smokeless tobacco: Never         OPHTHALMIC EXAM:  Base Eye Exam     Visual Acuity (ETDRS)       Right Left   Dist cc 20/40 -2 20/40   Dist ph cc NI 20/30    Correction: Glasses         Tonometry (Tonopen, 10:22 AM)       Right Left    Pressure 19 19         Pupils       Pupils Dark Light Shape React APD   Right PERRL 5 4 Round Slow None   Left PERRL 5 4 Round Slow None         Visual Fields (Counting fingers)       Left Right    Full Full         Extraocular Movement       Right Left    Full Full         Neuro/Psych     Oriented x3: Yes   Mood/Affect: Normal         Dilation     Both eyes: 1.0% Mydriacyl, 2.5% Phenylephrine @ 10:27 AM           Slit Lamp and Fundus Exam     External Exam       Right Left   External Normal Normal         Slit Lamp Exam       Right Left   Lids/Lashes Normal Normal   Conjunctiva/Sclera White and quiet White and quiet   Cornea Clear Clear   Anterior Chamber Deep and quiet Deep and quiet   Iris Round and reactive Round and reactive   Lens Centered posterior chamber intraocular lens Centered posterior chamber intraocular lens   Anterior Vitreous Normal Normal         Fundus Exam       Right Left   Posterior Vitreous Clear, post vitrectomy Clear vitrectomized   Disc Normal Normal   C/D Ratio 0.15 0.15   Macula Small intraretinal hemorrhage, found at time of surgical intervention, likely diabetic related. No topographic distortion now Microaneurysms, no macular thickening, no exudates, no clinically significant macular edema   Vessels NPDR- Moderate NPDR- Moderate   Periphery Normal Normal            IMAGING AND PROCEDURES  Imaging and Procedures for 07/21/21  OCT, Retina - OU - Both Eyes       Right Eye Quality was good. Scan locations included subfoveal. Central Foveal Thickness: 313. Progression has improved. Findings include abnormal foveal contour.   Left Eye Quality was good. Scan locations included subfoveal. Central Foveal Thickness: 310. Progression has been stable. Findings include cystoid macular edema, abnormal foveal contour.   Notes OD with minor intraretinal fluid, status post vitrectomy membrane peel 44much  less thickening, much less outer retinal disturbance as compared to epiretinal membrane present November 2021  ASSESSMENT/PLAN:  Both eyes affected by mild nonproliferative diabetic retinopathy with macular edema, associated with type 2 diabetes mellitus (HCC) Bilateral minor macular edema, overall likely because of the ongoing patient's uremia.  This minor CME from underlying uremia is likely improve once plan dialysis commences in the future in the coming months.     ICD-10-CM   1. Cystoid macular edema of left eye  H35.352 OCT, Retina - OU - Both Eyes    2. Cystoid macular edema of right eye  H35.351 OCT, Retina - OU - Both Eyes    3. Moderate nonproliferative diabetic retinopathy of both eyes without macular edema associated with type 2 diabetes mellitus (HCC)  T55.7322 OCT, Retina - OU - Both Eyes    4. 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  Bilateral mild CME CSME likely in this stage of of NPDR from superimposed uremic changes.  Patient is scheduled for dialysis in the coming months.  This will likely improve.  2.  Specific intravitreal or topical therapy is appropriate for either eye at this time with stable acuity.    3.  OD overall improved status post vitrectomy membrane peel right eye since November 2021  Ophthalmic Meds Ordered this visit:  No orders of the defined types were placed in this encounter.      Return in about 9 months (around 04/20/2022) for DILATE OU, OCT.  There are no Patient Instructions on file for this visit.   Explained the diagnoses, plan, and follow up with the patient and they expressed understanding.  Patient expressed understanding of the importance of proper follow up care.   Clent Demark Haytham Maher M.D. Diseases & Surgery of the Retina and Vitreous Retina & Diabetic [place] 07/21/21     Abbreviations: M  myopia (nearsighted); A astigmatism; H hyperopia (farsighted); P presbyopia; Mrx spectacle prescription;  CTL contact lenses; OD right eye; OS left eye; OU both eyes  XT exotropia; ET esotropia; PEK punctate epithelial keratitis; PEE punctate epithelial erosions; DES dry eye syndrome; MGD meibomian gland dysfunction; ATs artificial tears; PFAT's preservative free artificial tears; [place] nuclear sclerotic cataract; PSC posterior subcapsular cataract; ERM epi-retinal membrane; PVD posterior vitreous detachment; RD retinal detachment; DM diabetes mellitus; DR diabetic retinopathy; NPDR non-proliferative diabetic retinopathy; PDR proliferative diabetic retinopathy; CSME clinically significant macular edema; DME diabetic macular edema; dbh dot blot hemorrhages; CWS cotton wool spot; POAG primary open angle glaucoma; C/D cup-to-disc ratio; HVF humphrey visual field; GVF goldmann visual field; OCT optical coherence tomography; IOP intraocular pressure; BRVO Branch retinal vein occlusion; CRVO central retinal vein occlusion; CRAO central retinal artery occlusion; BRAO branch retinal artery occlusion; RT retinal tear; SB scleral buckle; PPV pars plana vitrectomy; VH Vitreous hemorrhage; PRP panretinal laser photocoagulation; IVK intravitreal kenalog; VMT vitreomacular traction; MH Macular hole;  NVD neovascularization of the disc; NVE neovascularization elsewhere; AREDS age related eye disease study; ARMD age related macular degeneration; POAG primary open angle glaucoma; EBMD epithelial/anterior basement membrane dystrophy; ACIOL anterior chamber intraocular lens; IOL intraocular lens; PCIOL posterior chamber intraocular lens; Phaco/IOL phacoemulsification with intraocular lens placement; [place] photorefractive keratectomy; LASIK laser assisted in situ keratomileusis; HTN hypertension; DM diabetes mellitus; COPD chronic obstructive pulmonary disease

## 2021-07-25 ENCOUNTER — Ambulatory Visit
Admission: RE | Admit: 2021-07-25 | Discharge: 2021-07-25 | Disposition: A | Discharge status: Home / Self Care | Payer: Medicare PPO | Source: Ambulatory Visit | Attending: Family Medicine | Admitting: Family Medicine

## 2021-07-25 ENCOUNTER — Other Ambulatory Visit: Payer: Self-pay

## 2021-07-25 ENCOUNTER — Ambulatory Visit (INDEPENDENT_AMBULATORY_CARE_PROVIDER_SITE_OTHER): Payer: Medicare PPO | Admitting: Nurse Practitioner

## 2021-07-25 ENCOUNTER — Other Ambulatory Visit (INDEPENDENT_AMBULATORY_CARE_PROVIDER_SITE_OTHER): Payer: Medicare PPO

## 2021-07-25 DIAGNOSIS — I6521 Occlusion and stenosis of right carotid artery: Secondary | ICD-10-CM | POA: Diagnosis not present

## 2021-07-25 DIAGNOSIS — I779 Disorder of arteries and arterioles, unspecified: Secondary | ICD-10-CM

## 2021-07-25 HISTORY — DX: Disorder of arteries and arterioles, unspecified: I77.9

## 2021-07-26 ENCOUNTER — Encounter (INDEPENDENT_AMBULATORY_CARE_PROVIDER_SITE_OTHER): Payer: Self-pay | Admitting: Vascular Surgery

## 2021-07-26 ENCOUNTER — Ambulatory Visit (INDEPENDENT_AMBULATORY_CARE_PROVIDER_SITE_OTHER): Payer: Medicare PPO

## 2021-07-26 ENCOUNTER — Ambulatory Visit (INDEPENDENT_AMBULATORY_CARE_PROVIDER_SITE_OTHER): Payer: Medicare PPO | Admitting: Vascular Surgery

## 2021-07-26 VITALS — BP 145/57 | HR 52 | Resp 16 | Wt 144.8 lb

## 2021-07-26 DIAGNOSIS — E1122 Type 2 diabetes mellitus with diabetic chronic kidney disease: Secondary | ICD-10-CM

## 2021-07-26 DIAGNOSIS — I10 Essential (primary) hypertension: Secondary | ICD-10-CM

## 2021-07-26 DIAGNOSIS — I70212 Atherosclerosis of native arteries of extremities with intermittent claudication, left leg: Secondary | ICD-10-CM | POA: Diagnosis not present

## 2021-07-26 DIAGNOSIS — N184 Chronic kidney disease, stage 4 (severe): Secondary | ICD-10-CM

## 2021-07-26 NOTE — Patient Instructions (Signed)
AV Fistula Placement  Arteriovenous (AV) fistula placement is a surgical procedure to create a connection between a blood vessel that carries blood away from the heart (artery) and a blood vessel that returns blood to the heart (vein). This connection is called a fistula. It is often made in the forearm orupper arm. You may need this procedure if you are getting hemodialysis treatments for kidney disease. An AV fistula makes your vein larger and stronger over several months. This makes the vein a safe and easy spot to insert the needles that areused for hemodialysis. Tell a health care provider about: Any allergies you have. All medicines you are taking, including vitamins, herbs, eye drops, creams, and over-the-counter medicines. Any problems you or family members have had with anesthetic medicines. Any blood disorders you have. Any surgeries you have had. Any medical conditions you have or have had in the past. Whether you are pregnant or may be pregnant. What are the risks? Generally, this is a safe procedure. However, problems may occur, including: Infection. Blood clot. Reduced blood flow (stenosis). Weakening or ballooning out of the fistula (aneurysm). Bleeding. Allergic reactions to medicines. Nerve damage. Swelling near the fistula. Failure of the procedure. What happens before the procedure? Staying hydrated Follow instructions from your health care provider about hydration, which may include: Up to 2 hours before the procedure - you may continue to drink clear liquids, such as water, clear fruit juice, black coffee, and plain tea.  Eating and drinking restrictions Follow instructions from your health care provider about eating and drinking, which may include: 8 hours before the procedure - stop eating heavy meals or foods, such as meat, fried foods, or fatty foods. 6 hours before the procedure - stop eating light meals or foods, such as toast or cereal. 6 hours before the  procedure - stop drinking milk or drinks that contain milk. 2 hours before the procedure - stop drinking clear liquids. Medicines Ask your health care provider about: Changing or stopping your regular medicines. This is especially important if you are taking diabetes medicines or blood thinners. Taking medicines such as aspirin and ibuprofen. These medicines can thin your blood. Do not take these medicines unless your health care provider tells you to take them. Taking over-the-counter medicines, vitamins, herbs, and supplements. General instructions Do not use any products that contain nicotine or tobacco for at least 4 weeks before the procedure. These products include cigarettes, chewing tobacco, and vaping devices, such as e-cigarettes. If you need help quitting, ask your health care provider. Imaging tests of your arm may be done to find the best place for the fistula. Plan to have a responsible adult take you home from the hospital or clinic. Ask your health care provider: How your surgery site will be marked. What steps will be taken to help prevent infection. These steps may include: Removing hair at the surgery site. Washing skin with a germ-killing soap. Taking antibiotic medicine. What happens during the procedure? An IV will be inserted into one of your veins. You will be given one or more of the following: A medicine to help you relax (sedative). A medicine to numb the area (local anesthetic). A medicine to make you fall asleep (general anesthetic). A medicine that is injected into an area of your body to numb everything below the injection site (regional anesthetic). An incision will be made on the inner side of your arm. A vein and an artery will be opened and connected with stitches (sutures). The  incision will be closed with sutures or clips. A bandage (dressing) will be placed over the area. The procedure may vary among health care providers and hospitals. What happens  after the procedure? Your blood pressure, heart rate, breathing rate, and blood oxygen level may be monitored until you leave the hospital or clinic. Your fistula site will be checked for bleeding or swelling. You will be given pain medicine as needed. If you were given a sedative during the procedure, it can affect you for several hours. Do not drive or operate machinery until your health care provider says that it is safe. Summary Arteriovenous (AV) fistula placement is a surgical procedure to create a connection between a blood vessel that carries blood away from your heart (artery) and a blood vessel that returns blood to your heart (vein). This connection is called a fistula. Follow instructions from your health care provider about eating and drinking before the procedure. Ask your health care provider about changing or stopping your regular medicines before the procedure. This is especially important if you are taking diabetes medicines or blood thinners. Plan to have a responsible adult take you home from the hospital or clinic. This information is not intended to replace advice given to you by your health care provider. Make sure you discuss any questions you have with your healthcare provider. Document Revised: 07/14/2020 Document Reviewed: 07/14/2020 Elsevier Patient Education  [place].

## 2021-07-26 NOTE — Progress Notes (Signed)
Patient ID: Alexandria Moody, female   DOB: 1946/05/31, 75 y.o.   MRN: [id number]  No chief complaint on file.   HPI Alexandria Moody is a 75 y.o. female.  I am asked to see the patient by Dr. Holley Raring for evaluation of permanent dialysis access placement.  The patient has stage IV chronic kidney disease and is nearing dialysis dependence although she has not yet started dialysis.  She has never had a dialysis access placement.  She is right-hand dominant.  We have recently seen her for PAD which were managed conservatively in part because of her severe chronic kidney disease not wanting to give her any contrast.  She does not have any limb threatening symptoms and that is stable today.     Past Medical History:  Diagnosis Date   Diabetes mellitus without complication ([place])    Hypertension     Past Surgical History:  Procedure Laterality Date   CATARACT EXTRACTION Left 2014   EYE SURGERY Left 07/22/2020   Dr. Towanda Octave, Vit and Marta Lamas   Family History No bleeding disorders, clotting sores, autoimmune diseases, or aneurysms   Social History   Tobacco Use   Smoking status: Never   Smokeless tobacco: Never  NO ETOH or drug abuse retired  Allergies  Allergen Reactions   Other Other (See Comments)    Tears skin Other reaction(s): Other (See Comments) Tears skin    Current Outpatient Medications  Medication Sig Dispense Refill   acetaminophen (TYLENOL) 325 MG tablet Take by mouth.     albuterol (VENTOLIN HFA) 108 (90 Base) MCG/ACT inhaler Inhale into the lungs.     amLODipine (NORVASC) 5 MG tablet Take 1 tablet by mouth 2 (two) times daily.     aspirin 81 MG EC tablet Take by mouth.     azaTHIOprine (IMURAN) 50 MG tablet Take 1 tablet by mouth 2 (two) times daily.     calcitRIOL (ROCALTROL) 0.25 MCG capsule Take by mouth.     Cholecalciferol 125 MCG (5000 UT) capsule Take by mouth.     citalopram (CELEXA) 20 MG tablet Take by mouth.     furosemide (LASIX) 20 MG tablet Take by  mouth. (Patient not taking: No sig reported)     hydrALAZINE (APRESOLINE) 50 MG tablet Take by mouth. (Patient not taking: No sig reported)     insulin glargine, 1 Unit Dial, (TOUJEO SOLOSTAR) 300 UNIT/ML Solostar Pen Inject 32 Units into the skin daily.     Insulin Pen Needle (BD PEN NEEDLE NANO 2ND GEN) 32G X 4 MM MISC daily.     ipratropium (ATROVENT) 0.06 % nasal spray Place into the nose.     isosorbide mononitrate (IMDUR) 120 MG 24 hr tablet Take by mouth.     levothyroxine (SYNTHROID) 75 MCG tablet Take by mouth.     linagliptin (TRADJENTA) 5 MG TABS tablet 1 (one) time each day     losartan (COZAAR) 25 MG tablet Take by mouth.     nitroGLYCERIN (NITROSTAT) 0.4 MG SL tablet PLACE 1 TABLET UNDER THE TONGUE EVERY 5 MINUTES AS NEEDED FOR CHEST PAIN. MAY TAKE UP TO 3 DOSES     Omega-3 Fatty Acids (FISH OIL) 1000 MG CAPS Take by mouth.     omeprazole (PRILOSEC) 20 MG capsule Take 1 capsule by mouth daily.     ranolazine (RANEXA) 500 MG 12 hr tablet Take by mouth.     rosuvastatin (CRESTOR) 20 MG tablet Take by mouth.     temazepam (  RESTORIL) 15 MG capsule Take by mouth.     Tiotropium Bromide-Olodaterol 2.5-2.5 MCG/ACT AERS Inhale into the lungs. (Patient not taking: No sig reported)     torsemide (DEMADEX) 20 MG tablet Take by mouth.     No current facility-administered medications for this visit.     REVIEW OF SYSTEMS (Negative unless checked)   Constitutional: _0 Weight loss  _1 Fever  _2 Chills Cardiac: _3 Chest pain   _4 Chest pressure   _5 Palpitations   _6 Shortness of breath when laying flat   _7 Shortness of breath at rest   _8 Shortness of breath with exertion. Vascular:  _9 Pain in legs with walking   _10 Pain in legs at rest   _11 Pain in legs when laying flat   _12 Claudication   _13 Pain in feet when walking  _14 Pain in feet at rest  _15 Pain in feet when laying flat   _16 History of DVT   _17 Phlebitis   _18 Swelling in legs   _19 Varicose veins   _20 Non-healing ulcers Pulmonary:   _21 Uses home oxygen    _22 Productive cough   _23 Hemoptysis   _24 Wheeze  _25 COPD   _26 Asthma Neurologic:  _27 Dizziness  _28 Blackouts   _29 Seizures   _30 History of stroke   _31 History of TIA  _32 Aphasia   _33 Temporary blindness   _34 Dysphagia   _35 Weakness or numbness in arms   _36 Weakness or numbness in legs Musculoskeletal:  _37 Arthritis   _38 Joint swelling   _39 Joint pain   _40 Low back pain Hematologic:  _41 Easy bruising  _42 Easy bleeding   _43 Hypercoagulable state   _44 Anemic  _45 Hepatitis Gastrointestinal:  _46 Blood in stool   _47 Vomiting blood  _48 Gastroesophageal reflux/heartburn   _49 Abdominal pain Genitourinary:  _50 Chronic kidney disease   _51 Difficult urination  _52 Frequent urination  _53 Burning with urination   _54 Hematuria Skin:  _55 Rashes   _56 Ulcers   _57 Wounds Psychological:  _58 History of anxiety   _59  History of major depression.    Physical Exam There were no vitals taken for this visit. Gen:  WD/WN, NAD Head: /AT, No temporalis wasting.  Ear/Nose/Throat: Hearing grossly intact, nares w/o erythema or drainage, oropharynx w/o Erythema/Exudate Eyes: Conjunctiva clear, sclera non-icteric  Neck: trachea midline.  No JVD.  Pulmonary:  Good air movement, respirations not labored, no use of accessory muscles  Cardiac: RRR, no JVD Vascular:  Vessel Right Left  Radial Palpable Palpable                               Musculoskeletal: M/S 5/5 throughout.  Extremities without ischemic changes.  No deformity or atrophy. Allens test normal bilaterally. Neurologic: Sensation grossly intact in extremities.  Symmetrical.  Speech is fluent. Motor exam as listed above. Psychiatric: Judgment intact, Mood & affect appropriate for pt's clinical situation. Dermatologic: No rashes or ulcers noted.  No cellulitis or open wounds.    Radiology US Carotid Bilateral  Result Date: 07/25/2021 CLINICAL DATA:  Carotid atherosclerosis history of bilateral endarterectomy, asymptomatic carotid bruit EXAM: BILATERAL CAROTID DUPLEX ULTRASOUND  TECHNIQUE: Pearline Cables scale imaging, color Doppler and duplex ultrasound were performed of bilateral carotid and vertebral arteries in the neck. COMPARISON:  None available FINDINGS: Criteria: Quantification of carotid stenosis is based on velocity parameters that correlate the residual internal carotid diameter with NASCET-based stenosis levels, using the diameter of the distal internal carotid lumen as the denominator for stenosis measurement. The following velocity measurements were obtained: RIGHT ICA: 117/20 cm/sec CCA: 96/29 cm/sec SYSTOLIC ICA/CCA RATIO:  1.2 ECA: 118 cm/sec LEFT ICA: 128/22 cm/sec CCA: 528/41 cm/sec SYSTOLIC ICA/CCA  RATIO:  1.2 ECA: 172 cm/sec RIGHT CAROTID ARTERY: Mildly ectatic carotid bifurcation and scattered atherosclerotic change. Negative for stenosis, velocity elevation, or turbulent flow. Degree of narrowing less than 50% by ultrasound criteria. RIGHT VERTEBRAL ARTERY:  Antegrade flow LEFT CAROTID ARTERY: Scattered atherosclerotic changes and mild ectasia. Negative for significant stenosis, velocity elevation, or turbulent flow. Degree of narrowing also less than 50% by ultrasound criteria. LEFT VERTEBRAL ARTERY:  Antegrade flow IMPRESSION: Bilateral carotid atherosclerosis. Negative for stenosis. Degree of narrowing less than 50% bilaterally by ultrasound criteria. Patent antegrade vertebral flow bilaterally Electronically Signed   By: Jerilynn Mages.  Shick M.D.   On: 07/25/2021 11:56   OCT, Retina - OU - Both Eyes  Result Date: 07/21/2021 Right Eye Quality was good. Scan locations included subfoveal. Central Foveal Thickness: 313. Progression has improved. Findings include abnormal foveal contour. Left Eye Quality was good. Scan locations included subfoveal. Central Foveal Thickness: 310. Progression has been stable. Findings include cystoid macular edema, abnormal foveal contour. Notes OD with minor intraretinal fluid, status post vitrectomy membrane peel 81mch less thickening, much less outer  retinal disturbance as compared to epiretinal membrane present November 2021   Labs Recent Results (from the past 2160 hour(s))  Comprehensive metabolic panel     Status: Abnormal   Collection Time: 04/28/21  1:43 PM  Result Value Ref Range   Sodium 135 135 - 145 mmol/L   Potassium 4.9 3.5 - 5.1 mmol/L   Chloride 106 98 - 111 mmol/L   CO2 20 (L) 22 - 32 mmol/L   Glucose, Bld 101 (H) 70 - 99 mg/dL    Comment: Glucose reference range applies only to samples taken after fasting for at least 8 hours.   BUN 56 (H) 8 - 23 mg/dL   Creatinine, Ser 3.01 (H) 0.44 - 1.00 mg/dL   Calcium 8.4 (L) 8.9 - 10.3 mg/dL   Total Protein 6.9 6.5 - 8.1 g/dL   Albumin 3.3 (L) 3.5 - 5.0 g/dL   AST 18 15 - 41 U/L   ALT 12 0 - 44 U/L   Alkaline Phosphatase 114 38 - 126 U/L   Total Bilirubin 0.8 0.3 - 1.2 mg/dL   GFR, Estimated 16 (L) >60 mL/min    Comment: (NOTE) Calculated using the CKD-EPI Creatinine Equation (2021)    Anion gap 9 5 - 15    Comment: [facility] [address], [id number] CBC with Differential     Status: Abnormal   Collection Time: 04/28/21  1:43 PM  Result Value Ref Range   WBC 3.3 (L) 4.0 - 10.5 K/uL   RBC 2.34 (L) 3.87 - 5.11 MIL/uL   Hemoglobin 7.8 (L) 12.0 - 15.0 g/dL   HCT 23.6 (L) 36.0 - 46.0 %   MCV 100.9 (H) 80.0 - 100.0 fL   MCH 33.3 26.0 - 34.0 pg   MCHC 33.1 30.0 - 36.0 g/dL   RDW 21.2 (H) 11.5 - 15.5 %   Platelets 234 150 - 400 K/uL   nRBC 0.0 0.0 - 0.2 %   Neutrophils Relative % 69 %   Neutro Abs 2.3 1.7 - 7.7 K/uL   Lymphocytes Relative 23 %   Lymphs Abs 0.7 0.7 - 4.0 K/uL   Monocytes Relative 6 %   Monocytes Absolute 0.2 0.1 - 1.0 K/uL   Eosinophils Relative 1 %   Eosinophils Absolute 0.0 0.0 - 0.5 K/uL   Basophils Relative 0 %   Basophils Absolute 0.0 0.0 -  0.1 K/uL   Immature Granulocytes 1 %   Abs Immature Granulocytes 0.02 0.00 - 0.07 K/uL    Comment: Performed at [facility], [address] [place], [place] [address]  Ferritin     Status: None   Collection Time: 04/28/21  1:43 PM  Result Value Ref Range   Ferritin 305 11 - 307 ng/mL    Comment: Performed at [facility], [address] [place], [place] [address]  Iron and TIBC     Status: Abnormal   Collection Time: 04/28/21  1:43 PM  Result Value Ref Range   Iron 59 28 - 170 ug/dL   TIBC 244 (L) 250 - 450 ug/dL   Saturation Ratios 24 10.4 - 31.8 %   UIBC 185 ug/dL    Comment: Performed at [facility], [address] [place], [place] [address]  Comprehensive metabolic panel     Status: Abnormal   Collection Time: 05/03/21  1:50 PM  Result Value Ref Range   Sodium 137 135 - 145 mmol/L   Potassium 4.8 3.5 - 5.1 mmol/L   Chloride 108 98 - 111 mmol/L   CO2 21 (L) 22 - 32 mmol/L   Glucose, Bld 138 (H) 70 - 99 mg/dL    Comment: Glucose reference range applies only to samples taken after fasting for at least 8 hours.   BUN 49 (H) 8 - 23 mg/dL   Creatinine, Ser 3.07 (H) 0.44 - 1.00 mg/dL   Calcium 8.4 (L) 8.9 - 10.3 mg/dL   Total Protein 6.8 6.5 - 8.1 g/dL   Albumin 3.4 (L) 3.5 - 5.0 g/dL   AST 21 15 - 41 U/L   ALT 13 0 - 44 U/L   Alkaline Phosphatase 99 38 - 126 U/L   Total Bilirubin 0.7 0.3 - 1.2 mg/dL   GFR, Estimated 15 (L) >60 mL/min    Comment: (NOTE) Calculated using the CKD-EPI Creatinine Equation (2021)    Anion gap 8 5 - 15    Comment: Performed at [facility], [address], [place], [place] [address]  CBC with Differential/Platelet     Status: Abnormal   Collection Time: 05/03/21  1:50 PM  Result Value Ref Range   WBC 2.9 (L) 4.0 - 10.5 K/uL   RBC 2.31 (L) 3.87 - 5.11 MIL/uL   Hemoglobin 7.8 (L) 12.0 - 15.0 g/dL   HCT 23.7 (L) 36.0 - 46.0 %   MCV 102.6 (H) 80.0 - 100.0 fL   MCH 33.8 26.0 - 34.0 pg   MCHC 32.9 30.0 - 36.0 g/dL   RDW 21.8 (H) 11.5 - 15.5 %   Platelets 265 150 - 400 K/uL   nRBC 0.7 (H) 0.0 - 0.2 %   Neutrophils Relative % 69 %   Neutro Abs  2.0 1.7 - 7.7 K/uL   Lymphocytes Relative 22 %   Lymphs Abs 0.6 (L) 0.7 - 4.0 K/uL   Monocytes Relative 8 %   Monocytes Absolute 0.2 0.1 - 1.0 K/uL   Eosinophils Relative 0 %   Eosinophils Absolute 0.0 0.0 - 0.5 K/uL   Basophils Relative 1 %   Basophils Absolute 0.0 0.0 - 0.1 K/uL   Immature Granulocytes 0 %   Abs Immature Granulocytes 0.01 0.00 - 0.07 K/uL    Comment: Performed at [facility], [address] [place], [place] [address]  Comprehensive metabolic panel     Status: Abnormal   Collection Time: 05/05/21  2:25  PM  Result Value Ref Range   Sodium 135 135 - 145 mmol/L   Potassium 4.5 3.5 - 5.1 mmol/L   Chloride 108 98 - 111 mmol/L   CO2 20 (L) 22 - 32 mmol/L   Glucose, Bld 146 (H) 70 - 99 mg/dL    Comment: Glucose reference range applies only to samples taken after fasting for at least 8 hours.   BUN 57 (H) 8 - 23 mg/dL   Creatinine, Ser 2.99 (H) 0.44 - 1.00 mg/dL   Calcium 8.3 (L) 8.9 - 10.3 mg/dL   Total Protein 6.7 6.5 - 8.1 g/dL   Albumin 3.4 (L) 3.5 - 5.0 g/dL   AST 20 15 - 41 U/L   ALT 12 0 - 44 U/L   Alkaline Phosphatase 108 38 - 126 U/L   Total Bilirubin 0.7 0.3 - 1.2 mg/dL   GFR, Estimated 16 (L) >60 mL/min    Comment: (NOTE) Calculated using the CKD-EPI Creatinine Equation (2021)    Anion gap 7 5 - 15    Comment: Performed at [facility], [address], [place], [place] [address]  CBC with Differential/Platelet     Status: Abnormal   Collection Time: 05/05/21  2:25 PM  Result Value Ref Range   WBC 2.6 (L) 4.0 - 10.5 K/uL   RBC 2.22 (L) 3.87 - 5.11 MIL/uL   Hemoglobin 7.4 (L) 12.0 - 15.0 g/dL   HCT 22.9 (L) 36.0 - 46.0 %   MCV 103.2 (H) 80.0 - 100.0 fL   MCH 33.3 26.0 - 34.0 pg   MCHC 32.3 30.0 - 36.0 g/dL   RDW 22.3 (H) 11.5 - 15.5 %   Platelets 271 150 - 400 K/uL   nRBC 0.0 0.0 - 0.2 %   Neutrophils Relative % 63 %   Neutro Abs 1.6 (L) 1.7 - 7.7 K/uL   Lymphocytes Relative 29 %   Lymphs Abs 0.8 0.7 - 4.0 K/uL    Monocytes Relative 8 %   Monocytes Absolute 0.2 0.1 - 1.0 K/uL   Eosinophils Relative 0 %   Eosinophils Absolute 0.0 0.0 - 0.5 K/uL   Basophils Relative 0 %   Basophils Absolute 0.0 0.0 - 0.1 K/uL   Immature Granulocytes 0 %   Abs Immature Granulocytes 0.01 0.00 - 0.07 K/uL    Comment: Performed at [facility], [address] [place], [place] [address]  Retic Panel     Status: Abnormal   Collection Time: 05/05/21  3:32 PM  Result Value Ref Range   Retic Ct Pct 2.2 0.4 - 3.1 %   RBC. 2.24 (L) 3.87 - 5.11 MIL/uL   Retic Count, Absolute 48.8 19.0 - 186.0 K/uL   Immature Retic Fract 21.3 (H) 2.3 - 15.9 %   Reticulocyte Hemoglobin 40.7 >27.9 pg    Comment:        Given the high negative predictive value of a RET-He result > 32 pg iron deficiency is essentially excluded. If this patient is anemic other etiologies should be considered. Performed at [facility], [address] [place], [place] [address]   Multiple Myeloma Panel (SPEP&IFE w/QIG)     Status: Abnormal   Collection Time: 05/05/21  3:32 PM  Result Value Ref Range   IgG (Immunoglobin G), Serum 955 586 - 1,602 mg/dL   IgA 101 64 - 422 mg/dL   IgM (Immunoglobulin M), Srm 217 26 - 217 mg/dL   Total Protein ELP 6.0 6.0 - 8.5 g/dL  Albumin SerPl Elph-Mcnc 3.5 2.9 - 4.4 g/dL   Alpha 1 0.3 0.0 - 0.4 g/dL   Alpha2 Glob SerPl Elph-Mcnc 0.6 0.4 - 1.0 g/dL   B-Globulin SerPl Elph-Mcnc 0.7 0.7 - 1.3 g/dL   Gamma Glob SerPl Elph-Mcnc 0.9 0.4 - 1.8 g/dL   M Protein SerPl Elph-Mcnc Not Observed Not Observed g/dL   Globulin, Total 2.5 2.2 - 3.9 g/dL   Albumin/Glob SerPl 1.5 0.7 - 1.7   IFE 1 Comment (A)     Comment: Polyclonal increase detected in one or more immunoglobulins.   Please Note Comment     Comment: (NOTE) Protein electrophoresis scan will follow via computer, mail, or courier delivery. Performed At: [facility] [place], [place] [id number] Rush Farmer MD  [id number]   Kappa/lambda light chains     Status: Abnormal   Collection Time: 05/05/21  3:32 PM  Result Value Ref Range   Kappa free light chain 121.5 (H) 3.3 - 19.4 mg/L   Lambda free light chains 74.0 (H) 5.7 - 26.3 mg/L   Kappa, lambda light chain ratio 1.64 0.26 - 1.65    Comment: (NOTE) Performed At: [facility] [place], [place] [id number] Rush Farmer MD OE:[id number]   Flow cytometry panel-leukemia/lymphoma work-up     Status: None   Collection Time: 05/11/21  1:46 PM  Result Value Ref Range   PATH INTERP XXX-IMP Comment     Comment: No significant immunophenotypic abnormality detected   ANNOTATION COMMENT IMP Comment     Comment: Recommend clinical correlation and follow up as appropriate.   CLINICAL INFO Comment     Comment: (NOTE) Anemia Accompanying CBC dated 05-11-21 shows: WBC count 2.9, Hgb 8.3, Neu 2.0, Lym 0.7, Mon 0.3.    Specimen Type Comment     Comment: Peripheral blood   ASSESSMENT OF LEUKOCYTES Comment     Comment: (NOTE) No monoclonal B cell population is detected. kappa:lambda ratio 1.2 There is no loss of, or aberrant expression of, the pan T cell antigens to suggest a neoplastic T cell process. CD4:CD8 ratio 2.0 No circulating blasts are detected. There is no immunophenotypic evidence of abnormal myeloid maturation. Rare neutrophils show slightly left-shifted maturation. Rare monocytes show partial dim aberrant expression of CD56, a nonspecific finding that can be seen in association with both reactive/activated processes as well as neoplastic processes. Analysis of the leukocyte population shows: granulocytes 43%, monocytes 8%, lymphocytes 49%, blasts <0.1%, B cells 11%, T cells 35%, NK cells 3%.    % Viable Cells Comment     Comment: 90%   ANALYSIS AND GATING STRATEGY Comment     Comment: 8 color analysis with CD45/SSC   IMMUNOPHENOTYPING STUDY Comment     Comment: (NOTE) CD2       Normal         CD3        Normal CD4       Normal         CD5       Normal CD7       Normal         CD8       Normal CD10      Normal         CD11b     Normal CD13      Normal         CD14      Normal CD16      Normal  CD19      Normal CD20      Normal         CD33      Normal CD34      Normal         CD38      Normal CD45      Normal         CD56      See Text CD57      Normal         CD117     Normal HLA-DR    Normal         KAPPA     Normal LAMBDA    Normal         CD64      Normal    PATHOLOGIST NAME Comment     Comment: Cecilie Kicks, M. D.   COMMENT: Comment     Comment: (NOTE) Each antibody in this assay was utilized to assess for potential abnormalities of studied cell populations or to characterize identified abnormalities. This test was developed and its performance characteristics determined by [facility].  It has not been cleared or approved by the U.S. Food and Drug Administration. The FDA has determined that such clearance or approval is not necessary. This test is used for clinical purposes.  It should not be regarded as investigational or for research. Performed At: [facility] [address] [place], [place] [id number] Katina Degree MDPhD [id number] Performed At: [facility] [address] [place], [place] [id number] Katina Degree MDPhD [id number]   Lactate dehydrogenase     Status: Abnormal   Collection Time: 05/11/21  1:46 PM  Result Value Ref Range   LDH 200 (H) 98 - 192 U/L    Comment: Performed at [facility], [address], [place], [place] [address]  TSH     Status: None   Collection Time: 05/11/21  1:46 PM  Result Value Ref Range   TSH 1.005 0.350 - 4.500 uIU/mL    Comment: Performed by a 3rd Generation assay with a functional sensitivity of <=0.01 uIU/mL. Performed at [facility] [address] [place], [place] [address]   Sample to Blood Bank     Status: None   Collection Time: 05/11/21  1:46 PM  Result Value Ref Range    Blood Bank Specimen SAMPLE AVAILABLE FOR TESTING    Sample Expiration      05/14/2021,2359 Performed at [facility], [address] [place], [place] [address]   CBC with Differential/Platelet     Status: Abnormal   Collection Time: 05/11/21  1:46 PM  Result Value Ref Range   WBC 2.9 (L) 4.0 - 10.5 K/uL   RBC 2.42 (L) 3.87 - 5.11 MIL/uL   Hemoglobin 8.3 (L) 12.0 - 15.0 g/dL   HCT 25.3 (L) 36.0 - 46.0 %   MCV 104.5 (H) 80.0 - 100.0 fL   MCH 34.3 (H) 26.0 - 34.0 pg   MCHC 32.8 30.0 - 36.0 g/dL   RDW 22.7 (H) 11.5 - 15.5 %   Platelets 350 150 - 400 K/uL   nRBC 0.0 0.0 - 0.2 %   Neutrophils Relative % 66 %   Neutro Abs 2.0 1.7 - 7.7 K/uL   Lymphocytes Relative 23 %   Lymphs Abs 0.7 0.7 - 4.0 K/uL   Monocytes Relative 9 %   Monocytes Absolute 0.3 0.1 - 1.0 K/uL   Eosinophils Relative 0 %  Eosinophils Absolute 0.0 0.0 - 0.5 K/uL   Basophils Relative 1 %   Basophils Absolute 0.0 0.0 - 0.1 K/uL   Immature Granulocytes 1 %   Abs Immature Granulocytes 0.02 0.00 - 0.07 K/uL    Comment: Performed at [facility], [address], [place], [place] [address]  Pathologist smear review     Status: None   Collection Time: 05/11/21  1:46 PM  Result Value Ref Range   Path Review Blood smear is reviewed.     Comment: Patient with macrocytic anemia, thought to be multifactorial including chronic kidney disease. Undergoing hematology evaluation. Macrocytic anemia. Unremarkable RBC, WBC, and platelet morphology. Additional workup as per Dr. Tasia Catchings. Reviewed by Dellia Nims. Reuel Derby, M.D. Performed at [facility], [address] [place], [place] [address]   CBC with Differential/Platelet     Status: Abnormal   Collection Time: 05/20/21  1:28 PM  Result Value Ref Range   WBC 3.8 (L) 4.0 - 10.5 K/uL   RBC 2.26 (L) 3.87 - 5.11 MIL/uL   Hemoglobin 7.8 (L) 12.0 - 15.0 g/dL   HCT 23.5 (L) 36.0 - 46.0 %   MCV 104.0 (H) 80.0 - 100.0 fL   MCH 34.5 (H) 26.0 - 34.0 pg    MCHC 33.2 30.0 - 36.0 g/dL   RDW 23.0 (H) 11.5 - 15.5 %   Platelets 206 150 - 400 K/uL   nRBC 0.0 0.0 - 0.2 %   Neutrophils Relative % 64 %   Neutro Abs 2.4 1.7 - 7.7 K/uL   Lymphocytes Relative 25 %   Lymphs Abs 1.0 0.7 - 4.0 K/uL   Monocytes Relative 9 %   Monocytes Absolute 0.3 0.1 - 1.0 K/uL   Eosinophils Relative 0 %   Eosinophils Absolute 0.0 0.0 - 0.5 K/uL   Basophils Relative 1 %   Basophils Absolute 0.0 0.0 - 0.1 K/uL   Immature Granulocytes 1 %   Abs Immature Granulocytes 0.02 0.00 - 0.07 K/uL    Comment: Performed at [facility], [address], [place], [place] [address]  CBC with Differential/Platelet     Status: Abnormal   Collection Time: 06/16/21  2:44 PM  Result Value Ref Range   WBC 6.6 4.0 - 10.5 K/uL   RBC 2.41 (L) 3.87 - 5.11 MIL/uL   Hemoglobin 8.5 (L) 12.0 - 15.0 g/dL   HCT 25.3 (L) 36.0 - 46.0 %   MCV 105.0 (H) 80.0 - 100.0 fL   MCH 35.3 (H) 26.0 - 34.0 pg   MCHC 33.6 30.0 - 36.0 g/dL   RDW 17.2 (H) 11.5 - 15.5 %   Platelets 197 150 - 400 K/uL   nRBC 0.0 0.0 - 0.2 %   Neutrophils Relative % 79 %   Neutro Abs 5.2 1.7 - 7.7 K/uL   Lymphocytes Relative 14 %   Lymphs Abs 0.9 0.7 - 4.0 K/uL   Monocytes Relative 6 %   Monocytes Absolute 0.4 0.1 - 1.0 K/uL   Eosinophils Relative 0 %   Eosinophils Absolute 0.0 0.0 - 0.5 K/uL   Basophils Relative 0 %   Basophils Absolute 0.0 0.0 - 0.1 K/uL   Immature Granulocytes 1 %   Abs Immature Granulocytes 0.03 0.00 - 0.07 K/uL    Comment: Performed at [facility], [address], [place], [place] [address]  Hemoglobin and hematocrit, blood     Status: Abnormal   Collection Time: 06/30/21  1:58 PM  Result Value Ref  Range   Hemoglobin 9.0 (L) 12.0 - 15.0 g/dL   HCT 27.4 (L) 36.0 - 46.0 %    Comment: Performed at [facility], [address], [place], [place] [address]  Hemoglobin and hematocrit, blood     Status: Abnormal   Collection Time: 07/14/21  1:49 PM  Result Value  Ref Range   Hemoglobin 9.3 (L) 12.0 - 15.0 g/dL   HCT 28.3 (L) 36.0 - 46.0 %    Comment: Performed at [facility], [address], [place], [place] [address]    Assessment/Plan:  Type 2 diabetes mellitus ([place]) blood glucose control important in reducing the progression of atherosclerotic disease. Also, involved in wound healing. On appropriate medications.     Essential hypertension blood pressure control important in reducing the progression of atherosclerotic disease. On appropriate oral medications.   Atherosclerosis of native arteries of extremity with intermittent claudication (HCC) ABI recently were 0.95 on the right and 0.72 on the left.  Has stable claudication symptoms without limb threatening symptoms.  With her severe chronic kidney disease, I would not recommend any intervention unless she had limb threatening symptoms and she does not really want any intervention.  At this point, I have offered her continued follow-up and I will plan to see her in 1 year with noninvasive studies  Chronic kidney disease, stage 4 (severe) (HCC) Noninvasive studies today demonstrate marginal to adequate cephalic veins at both antecubital fossa's and adequate basilic veins in both upper extremities.  Her cephalic vein is visible at the left antecubital fossa and she has a normal arterial study on each side as well.  Left brachiocephalic AV fistula which is her nondominant arm would be the preferred surgery at this time.  Risks and benefits were discussed with the patient in detail and she is agreeable to proceed.      Leotis Pain 07/26/2021, 3:30 PM   This note was created with Dragon medical transcription system.  Any errors from dictation are unintentional.

## 2021-07-28 ENCOUNTER — Inpatient Hospital Stay: Payer: Medicare PPO | Attending: Oncology

## 2021-07-28 ENCOUNTER — Other Ambulatory Visit: Payer: Self-pay

## 2021-07-28 ENCOUNTER — Inpatient Hospital Stay: Payer: Medicare PPO

## 2021-07-28 VITALS — BP 124/64 | HR 46 | Temp 96.9°F | Resp 16

## 2021-07-28 DIAGNOSIS — I129 Hypertensive chronic kidney disease with stage 1 through stage 4 chronic kidney disease, or unspecified chronic kidney disease: Secondary | ICD-10-CM | POA: Insufficient documentation

## 2021-07-28 DIAGNOSIS — I7789 Other specified disorders of arteries and arterioles: Secondary | ICD-10-CM | POA: Diagnosis not present

## 2021-07-28 DIAGNOSIS — E039 Hypothyroidism, unspecified: Secondary | ICD-10-CM | POA: Insufficient documentation

## 2021-07-28 DIAGNOSIS — N184 Chronic kidney disease, stage 4 (severe): Secondary | ICD-10-CM | POA: Insufficient documentation

## 2021-07-28 DIAGNOSIS — D631 Anemia in chronic kidney disease: Secondary | ICD-10-CM | POA: Insufficient documentation

## 2021-07-28 DIAGNOSIS — E1122 Type 2 diabetes mellitus with diabetic chronic kidney disease: Secondary | ICD-10-CM | POA: Diagnosis not present

## 2021-07-28 LAB — HEMOGLOBIN AND HEMATOCRIT, BLOOD
HCT: 28.6 % — ABNORMAL LOW (ref 36.0–46.0)
Hemoglobin: 9.6 g/dL — ABNORMAL LOW (ref 12.0–15.0)

## 2021-07-28 MED ORDER — EPOETIN ALFA-EPBX 10000 UNIT/ML IJ SOLN
10000.0000 [IU] | Freq: Once | INTRAMUSCULAR | Status: AC
  Administered 2021-07-28: 10000 [IU] via SUBCUTANEOUS

## 2021-08-11 ENCOUNTER — Other Ambulatory Visit: Payer: Self-pay

## 2021-08-11 ENCOUNTER — Inpatient Hospital Stay (HOSPITAL_BASED_OUTPATIENT_CLINIC_OR_DEPARTMENT_OTHER): Payer: Medicare PPO | Admitting: Oncology

## 2021-08-11 ENCOUNTER — Inpatient Hospital Stay: Payer: Medicare PPO

## 2021-08-11 ENCOUNTER — Telehealth (INDEPENDENT_AMBULATORY_CARE_PROVIDER_SITE_OTHER): Payer: Self-pay

## 2021-08-11 ENCOUNTER — Encounter: Payer: Self-pay | Admitting: Oncology

## 2021-08-11 VITALS — BP 144/54 | HR 51 | Temp 97.7°F | Resp 18 | Wt 143.3 lb

## 2021-08-11 DIAGNOSIS — N189 Chronic kidney disease, unspecified: Secondary | ICD-10-CM

## 2021-08-11 DIAGNOSIS — D631 Anemia in chronic kidney disease: Secondary | ICD-10-CM

## 2021-08-11 DIAGNOSIS — I7782 Antineutrophilic cytoplasmic antibody (ANCA) vasculitis: Secondary | ICD-10-CM

## 2021-08-11 DIAGNOSIS — N184 Chronic kidney disease, stage 4 (severe): Secondary | ICD-10-CM | POA: Diagnosis not present

## 2021-08-11 DIAGNOSIS — I776 Arteritis, unspecified: Secondary | ICD-10-CM | POA: Diagnosis not present

## 2021-08-11 LAB — CBC WITH DIFFERENTIAL/PLATELET
Abs Immature Granulocytes: 0.03 10*3/uL (ref 0.00–0.07)
Basophils Absolute: 0 10*3/uL (ref 0.0–0.1)
Basophils Relative: 0 %
Eosinophils Absolute: 0 10*3/uL (ref 0.0–0.5)
Eosinophils Relative: 0 %
HCT: 30.4 % — ABNORMAL LOW (ref 36.0–46.0)
Hemoglobin: 10.1 g/dL — ABNORMAL LOW (ref 12.0–15.0)
Immature Granulocytes: 0 %
Lymphocytes Relative: 13 %
Lymphs Abs: 0.9 10*3/uL (ref 0.7–4.0)
MCH: 33.9 pg (ref 26.0–34.0)
MCHC: 33.2 g/dL (ref 30.0–36.0)
MCV: 102 fL — ABNORMAL HIGH (ref 80.0–100.0)
Monocytes Absolute: 0.4 10*3/uL (ref 0.1–1.0)
Monocytes Relative: 5 %
Neutro Abs: 5.6 10*3/uL (ref 1.7–7.7)
Neutrophils Relative %: 82 %
Platelets: 201 10*3/uL (ref 150–400)
RBC: 2.98 MIL/uL — ABNORMAL LOW (ref 3.87–5.11)
RDW: 13 % (ref 11.5–15.5)
WBC: 6.9 10*3/uL (ref 4.0–10.5)
nRBC: 0 % (ref 0.0–0.2)

## 2021-08-11 NOTE — Progress Notes (Signed)
Pt here for follow up. No new concerns voiced.   

## 2021-08-11 NOTE — Progress Notes (Addendum)
FOLLOW UP NOTE  Patient Care Team: Alexandria Camp, FNP as PCP - General (Family Medicine)  CHIEF COMPLAINTS/PURPOSE OF CONSULTATION: Anemia  HISTORY OF PRESENTING ILLNESS:  Alexandria Moody 75 y.o. female presents for follow-up  PERTINENT HEMATOLOGY HISTORY Patient previously was seen by nurse practitioner Alexandria Moody, patient switched care to me on 05/05/21 Extensive medical record review was performed by me  PMH  significant for chronic kidney disease stage IV, prior history of ANCA vasculitis, diabetes type 2, hypertension, PVD, hypothyroidism, GERD chronically secondary to chronic kidney disease.  04/05/2021 She evaluated by Dr. Posey Pronto on  for joint pain and found to have a large drop in her hemoglobin.  She had been seen 1 day prior by Dr. Holley Raring with a reported hemoglobin of 10.3.  She just moved to the area from [facility].  She has previously received blood transfusions, iron and Epogen.    Patient was seen by NP Alexandria Moody on 04/12/2021.  Anemia was felt and the patient was started on replacement therapy.  04/20/2021 Retacrit 10,000 units  04/28/2021 iron panel ferritin 305, iron saturation 24.  Patient received 1 dose of IV Venofer treatment. 05/03/2021, IV Venofer x1 05/05/2021, hemoglobin was 7.4, Retacrit 10,000 units.  05/11/2021, hemoglobin has improved to 8.3.  Hold off blood transfusion and Retacrit today.  Patient reports that appetite is fair.  Weight has been stable. She is not able to tolerate oral iron supplementation due to GI toxicities.  She receives monthly vitamin B12 injections through her PCP  Reports history of EGD/colonoscopy many years ago.  No recent EGD/colonoscopy.  INTERVAL HISTORY Alexandria Moody is a 75 y.o. female who has above history reviewed by me today presents for follow up visit for management of anemia Patient reports feeling well today energy level has improved.   MEDICAL HISTORY:  Past Medical History:  Diagnosis Date    Diabetes mellitus without complication ([place])    Hypertension     SURGICAL HISTORY: Past Surgical History:  Procedure Laterality Date   CATARACT EXTRACTION Left 2014   EYE SURGERY Left 07/22/2020   Dr. Towanda Octave, [facility]    SOCIAL HISTORY: Social History   Socioeconomic History   Marital status: Widowed    Spouse name: Not on file   Number of children: Not on file   Years of education: Not on file   Highest education level: Not on file  Occupational History   Not on file  Tobacco Use   Smoking status: Never   Smokeless tobacco: Never  Substance and Sexual Activity   Alcohol use: Not on file   Drug use: Not on file   Sexual activity: Not on file  Other Topics Concern   Not on file  Social History Narrative   Not on file   Social Determinants of Health   Financial Resource Strain: Not on file  Food Insecurity: Not on file  Transportation Needs: Not on file  Physical Activity: Not on file  Stress: Not on file  Social Connections: Not on file  Intimate Partner Violence: Not on file    FAMILY HISTORY: History reviewed. No pertinent family history.  ALLERGIES:  is allergic to other and tape.  MEDICATIONS:  Current Outpatient Medications  Medication Sig Dispense Refill   acetaminophen (TYLENOL) 325 MG tablet Take by mouth.     amLODipine (NORVASC) 5 MG tablet Take 1 tablet by mouth 2 (two) times daily.     aspirin 81 MG EC tablet Take by mouth.  azaTHIOprine (IMURAN) 50 MG tablet Take 1 tablet by mouth 2 (two) times daily.     calcitRIOL (ROCALTROL) 0.25 MCG capsule Take by mouth.     Cholecalciferol 125 MCG (5000 UT) capsule Take by mouth.     citalopram (CELEXA) 20 MG tablet Take by mouth.     gabapentin (NEURONTIN) 100 MG capsule Take 100 mg by mouth as directed. 1-2 capsules (100-200) total by mouth every evening as directed for insomnia     hydrALAZINE (APRESOLINE) 50 MG tablet Take by mouth in the morning and at bedtime.     insulin glargine,  1 Unit Dial, (TOUJEO SOLOSTAR) 300 UNIT/ML Solostar Pen Inject 32 Units into the skin daily.     Insulin Pen Needle (BD PEN NEEDLE NANO 2ND GEN) 32G X 4 MM MISC daily.     isosorbide mononitrate (IMDUR) 120 MG 24 hr tablet Take by mouth.     levothyroxine (SYNTHROID) 75 MCG tablet Take by mouth.     linagliptin (TRADJENTA) 5 MG TABS tablet 1 (one) time each day     losartan (COZAAR) 25 MG tablet Take by mouth.     Omega-3 Fatty Acids (FISH OIL) 1000 MG CAPS Take by mouth.     omeprazole (PRILOSEC) 20 MG capsule Take 1 capsule by mouth daily.     ranolazine (RANEXA) 500 MG 12 hr tablet Take by mouth.     rosuvastatin (CRESTOR) 20 MG tablet Take by mouth.     albuterol (VENTOLIN HFA) 108 (90 Base) MCG/ACT inhaler Inhale into the lungs. (Patient not taking: Reported on 08/11/2021)     furosemide (LASIX) 20 MG tablet Take by mouth. (Patient not taking: Reported on 08/11/2021)     ipratropium (ATROVENT) 0.06 % nasal spray Place into the nose. (Patient not taking: Reported on 08/11/2021)     nitroGLYCERIN (NITROSTAT) 0.4 MG SL tablet PLACE 1 TABLET UNDER THE TONGUE EVERY 5 MINUTES AS NEEDED FOR CHEST PAIN. MAY TAKE UP TO 3 DOSES (Patient not taking: Reported on 08/11/2021)     temazepam (RESTORIL) 15 MG capsule Take by mouth. (Patient not taking: Reported on 08/11/2021)     Tiotropium Bromide-Olodaterol 2.5-2.5 MCG/ACT AERS Inhale into the lungs. (Patient not taking: No sig reported)     torsemide (DEMADEX) 20 MG tablet Take by mouth. (Patient not taking: Reported on 08/11/2021)     No current facility-administered medications for this visit.    REVIEW OF SYSTEMS:   Review of Systems  Constitutional:  Negative for chills, fever, malaise/fatigue and weight loss.  HENT:  Negative for congestion, ear pain, nosebleeds and tinnitus.   Eyes: Negative.  Negative for blurred vision and double vision.  Respiratory:  Negative for cough, sputum production and shortness of breath.   Cardiovascular: Negative.   Negative for chest pain, palpitations and leg swelling.  Gastrointestinal: Negative.  Negative for abdominal pain, constipation, diarrhea, nausea and vomiting.  Genitourinary:  Negative for dysuria, frequency and urgency.  Musculoskeletal:  Negative for back pain and falls.  Skin: Negative.  Negative for rash.  Neurological:  Negative for dizziness, weakness and headaches.  Endo/Heme/Allergies: Negative.  Does not bruise/bleed easily.  Psychiatric/Behavioral:  Negative for depression. The patient is not nervous/anxious and does not have insomnia.    PHYSICAL EXAMINATION: ECOG PERFORMANCE STATUS: 1 - Symptomatic but completely ambulatory  Vitals:   08/11/21 1346  BP: (!) 144/54  Pulse: (!) 51  Resp: 18  Temp: 97.7 F (36.5 C)   Filed Weights   08/11/21 1346  Weight:  143 lb 4.8 oz (65 kg)    Physical Exam Constitutional:      Comments: Frail appearance, thin built, ambulates independently  HENT:     Head: Normocephalic and atraumatic.  Eyes:     Pupils: Pupils are equal, round, and reactive to light.  Cardiovascular:     Rate and Rhythm: Normal rate and regular rhythm.     Heart sounds: Murmur heard.  Pulmonary:     Effort: Pulmonary effort is normal.     Breath sounds: Normal breath sounds. No wheezing.  Abdominal:     General: Bowel sounds are normal. There is no distension.     Palpations: Abdomen is soft.     Tenderness: There is no abdominal tenderness.  Musculoskeletal:        General: Normal range of motion.     Cervical back: Normal range of motion.  Skin:    General: Skin is warm and dry.     Findings: No rash.  Neurological:     Mental Status: She is alert and oriented to person, place, and time.  Psychiatric:        Mood and Affect: Mood normal.    LABORATORY DATA:  I have reviewed the data as listed CBC    Component Value Date/Time   WBC 6.9 08/11/2021 1334   RBC 2.98 (L) 08/11/2021 1334   HGB 10.1 (L) 08/11/2021 1334   HCT 30.4 (L) 08/11/2021  1334   PLT 201 08/11/2021 1334   MCV 102.0 (H) 08/11/2021 1334   MCH 33.9 08/11/2021 1334   MCHC 33.2 08/11/2021 1334   RDW 13.0 08/11/2021 1334   LYMPHSABS 0.9 08/11/2021 1334   MONOABS 0.4 08/11/2021 1334   EOSABS 0.0 08/11/2021 1334   BASOSABS 0.0 08/11/2021 1334   CMP Latest Ref Rng & Units 05/05/2021 05/03/2021 04/28/2021  Glucose 70 - 99 mg/dL 146(H) 138(H) 101(H)  BUN 8 - 23 mg/dL 57(H) 49(H) 56(H)  Creatinine 0.44 - 1.00 mg/dL 2.99(H) 3.07(H) 3.01(H)  Sodium 135 - 145 mmol/L 135 137 135  Potassium 3.5 - 5.1 mmol/L 4.5 4.8 4.9  Chloride 98 - 111 mmol/L 108 108 106  CO2 22 - 32 mmol/L 20(L) 21(L) 20(L)  Calcium 8.9 - 10.3 mg/dL 8.3(L) 8.4(L) 8.4(L)  Total Protein 6.5 - 8.1 g/dL 6.7 6.8 6.9  Total Bilirubin 0.3 - 1.2 mg/dL 0.7 0.7 0.8  Alkaline Phos 38 - 126 U/L 108 99 114  AST 15 - 41 U/L 20 21 18   ALT 0 - 44 U/L 12 13 12     RADIOGRAPHIC STUDIES: I have personally reviewed the radiological images as listed and agreed with the findings in the report. US Carotid Bilateral  Result Date: 07/25/2021 CLINICAL DATA:  Carotid atherosclerosis history of bilateral endarterectomy, asymptomatic carotid bruit EXAM: BILATERAL CAROTID DUPLEX ULTRASOUND TECHNIQUE: Pearline Cables scale imaging, color Doppler and duplex ultrasound were performed of bilateral carotid and vertebral arteries in the neck. COMPARISON:  None available FINDINGS: Criteria: Quantification of carotid stenosis is based on velocity parameters that correlate the residual internal carotid diameter with NASCET-based stenosis levels, using the diameter of the distal internal carotid lumen as the denominator for stenosis measurement. The following velocity measurements were obtained: RIGHT ICA: 117/20 cm/sec CCA: 14/43 cm/sec SYSTOLIC ICA/CCA RATIO:  1.2 ECA: 118 cm/sec LEFT ICA: 128/22 cm/sec CCA: 154/00 cm/sec SYSTOLIC ICA/CCA RATIO:  1.2 ECA: 172 cm/sec RIGHT CAROTID ARTERY: Mildly ectatic carotid bifurcation and scattered atherosclerotic  change. Negative for stenosis, velocity elevation, or turbulent flow. Degree of narrowing  less than 50% by ultrasound criteria. RIGHT VERTEBRAL ARTERY:  Antegrade flow LEFT CAROTID ARTERY: Scattered atherosclerotic changes and mild ectasia. Negative for significant stenosis, velocity elevation, or turbulent flow. Degree of narrowing also less than 50% by ultrasound criteria. LEFT VERTEBRAL ARTERY:  Antegrade flow IMPRESSION: Bilateral carotid atherosclerosis. Negative for stenosis. Degree of narrowing less than 50% bilaterally by ultrasound criteria. Patent antegrade vertebral flow bilaterally Electronically Signed   By: Jerilynn Mages.  Shick M.D.   On: 07/25/2021 11:56   VAS [place] ABI WITH/WO TBI  Result Date: 06/28/2021  LOWER EXTREMITY DOPPLER STUDY Patient Name:  SHALESE STRAHAN  Date of Exam:   06/24/2021 Medical Rec #: [id number]     Accession #:    [id number] Date of Birth: 22-Mar-1946     Patient Gender: F Patient Age:   65Y Exam Location:  [facility] Procedure:      VAS [place] ABI WITH/WO TBI Referring Phys: [id number] [place] --------------------------------------------------------------------------------  Indications: Peripheral artery disease.  Performing Technologist: Blondell Reveal RT, RDMS, RVT  Examination Guidelines: A complete evaluation includes at minimum, Doppler waveform signals and systolic blood pressure reading at the level of bilateral brachial, anterior tibial, and posterior tibial arteries, when vessel segments are accessible. Bilateral testing is considered an integral part of a complete examination. Photoelectric Plethysmograph (PPG) waveforms and toe systolic pressure readings are included as required and additional duplex testing as needed. Limited examinations for reoccurring indications may be performed as noted.  ABI Findings: +---------+------------------+-----+--------+--------+ Right    Rt Pressure (mmHg)IndexWaveformComment  +---------+------------------+-----+--------+--------+  Brachial 190                                     +---------+------------------+-----+--------+--------+ ATA      164               0.86 biphasic         +---------+------------------+-----+--------+--------+ PTA      180               0.95 biphasic         +---------+------------------+-----+--------+--------+ Great Toe116               0.61 Normal           +---------+------------------+-----+--------+--------+ +---------+------------------+-----+------------+-------+ Left     Lt Pressure (mmHg)IndexWaveform    Comment +---------+------------------+-----+------------+-------+ Brachial 181                                        +---------+------------------+-----+------------+-------+ ATA      133               0.70 monophasic          +---------+------------------+-----+------------+-------+ PTA                             not detected        +---------+------------------+-----+------------+-------+ PERO     136               0.72 monophasic          +---------+------------------+-----+------------+-------+ Great Toe88                0.46 Normal              +---------+------------------+-----+------------+-------+  Summary: Right: Resting right ankle-brachial index is within  normal range. Evidence of mild tibial level arterial disease of right lower extremity. Mildly abnormal left TBI. Left: Resting left ankle-brachial index indicates moderate left lower extremity arterial disease. The left toe-brachial index is abnormal.  *See table(s) above for measurements and observations.  Electronically signed by Leotis Pain MD on 06/28/2021 at 5:15:02 PM.    Final    VAS [place] UPPER EXTREMITY ARTERIAL DUPLEX  Result Date: 07/26/2021  UPPER EXTREMITY DUPLEX STUDY Patient Name:  NARJIS MIRA  Date of Exam:   07/26/2021 Medical Rec #: [id number]     Accession #:    [id number] Date of Birth: 06/04/1946     Patient Gender: F Patient Age:   22 years Exam Location:  [facility] Procedure:      VAS [place] UPPER EXTREMITY ARTERIAL DUPLEX Referring Phys: Eulogio Ditch --------------------------------------------------------------------------------  Indications: Pre assess for HDA.  Performing Technologist: Almira Coaster RVS  Examination Guidelines: A complete evaluation includes B-mode imaging, spectral Doppler, color Doppler, and power Doppler as needed of all accessible portions of each vessel. Bilateral testing is considered an integral part of a complete examination. Limited examinations for reoccurring indications may be performed as noted.  Right Pre-Dialysis Findings: +-----------------------+----------+--------------------+---------+--------+ Location               PSV (cm/s)Intralum. Diam. (cm)Waveform Comments +-----------------------+----------+--------------------+---------+--------+ Brachial Antecub. fossa116       0.49                triphasic         +-----------------------+----------+--------------------+---------+--------+ Radial Art at Wrist    104       0.20                triphasic         +-----------------------+----------+--------------------+---------+--------+ Ulnar Art at Wrist     141       0.18                                  +-----------------------+----------+--------------------+---------+--------+  Left Pre-Dialysis Findings: +-----------------------+----------+--------------------+---------+--------+ Location               PSV (cm/s)Intralum. Diam. (cm)Waveform Comments +-----------------------+----------+--------------------+---------+--------+ Brachial Antecub. fossa101       0.44                triphasic         +-----------------------+----------+--------------------+---------+--------+ Radial Art at Wrist    92        0.26                triphasic         +-----------------------+----------+--------------------+---------+--------+ Ulnar Art at Wrist     114       0.18                triphasic          +-----------------------+----------+--------------------+---------+--------+  Summary  Right: The Right Radial Artery, Ulnar Artery and Brachial Artery        display Triphasic Waveforms. Left: The Left Radial Artery, Ulnar Artery and Brachial Artery display Triphasic Waveforms. Electronically signed by Leotis Pain MD on 07/26/2021 at 5:10:10 PM.    Final    OCT, Retina - OU - Both Eyes  Result Date: 07/21/2021 Right Eye Quality was good. Scan locations included subfoveal. Central Foveal Thickness: 313. Progression has improved. Findings include abnormal foveal contour. Left Eye Quality was good. Scan locations included subfoveal. Central Foveal Thickness: 310. Progression has been  stable. Findings include cystoid macular edema, abnormal foveal contour. Notes OD with minor intraretinal fluid, status post vitrectomy membrane peel 38much less thickening, much less outer retinal disturbance as compared to epiretinal membrane present November 2021  VAS [place] LOWER EXTREMITY ARTERIAL DUPLEX  Result Date: 06/28/2021 LOWER EXTREMITY ARTERIAL DUPLEX STUDY Patient Name:  PHOEBIE SHAD  Date of Exam:   06/24/2021 Medical Rec #: [id number]     Accession #:    [id number] Date of Birth: Oct 20, 1946     Patient Gender: F Patient Age:   35Y Exam Location:  [facility] Procedure:      VAS [place] LOWER EXTREMITY ARTERIAL DUPLEX Referring Phys: [id number] [place] --------------------------------------------------------------------------------  Indications: Peripheral artery disease.  Current ABI: Right=0.95 & Left=0.72 Performing Technologist: Blondell Reveal RT, RDMS, RVT  Examination Guidelines: A complete evaluation includes B-mode imaging, spectral Doppler, color Doppler, and power Doppler as needed of all accessible portions of each vessel. Bilateral testing is considered an integral part of a complete examination. Limited examinations for reoccurring indications may be performed as noted.   +-----------+--------+-----+--------+----------------+--------+ LEFT       PSV cm/sRatioStenosisWaveform        Comments +-----------+--------+-----+--------+----------------+--------+ CFA Mid    122                  biphasic                 +-----------+--------+-----+--------+----------------+--------+ DFA        85                   biphasic                 +-----------+--------+-----+--------+----------------+--------+ SFA Prox                        no flow detected         +-----------+--------+-----+--------+----------------+--------+ SFA Mid    33                   monophasic               +-----------+--------+-----+--------+----------------+--------+ SFA Distal 39                   monophasic               +-----------+--------+-----+--------+----------------+--------+ POP Prox   50                   biphasic                 +-----------+--------+-----+--------+----------------+--------+ POP Distal 52                   biphasic                 +-----------+--------+-----+--------+----------------+--------+ ATA Distal 59                   monophasic               +-----------+--------+-----+--------+----------------+--------+ PTA Distal                      no flow detected         +-----------+--------+-----+--------+----------------+--------+ PERO Distal57                   monophasic               +-----------+--------+-----+--------+----------------+--------+  Summary: Left: Apparent occlusion of the  proximal SFA with reconstitution at the mid thigh level. No flow was adequately detected in the distal posterior tibial artery which may represent occlusion. No significant stenosis noted throughout the remainder of the lower extremity arterial system.  See table(s) above for measurements and observations. Electronically signed by Leotis Pain MD on 06/28/2021 at 5:14:56 PM.    Final    VAS [place] UPPER EXT VEIN MAPPING (PRE-OP  AVF)  Result Date: 08/05/2021 UPPER EXTREMITY VEIN MAPPING Patient Name:  CATHERINA PATES  Date of Exam:   07/26/2021 Medical Rec #: [id number]     Accession #:    [id number] Date of Birth: 1946/09/17     Patient Gender: F Patient Age:   85 years [facility] Procedure:      VAS [place] UPPER EXT VEIN MAPPING (PRE-OP AVF) Referring Phys: Eulogio Ditch --------------------------------------------------------------------------------  Indications: Pre-access. Performing Technologist: Almira Coaster RVS  Examination Guidelines: A complete evaluation includes B-mode imaging, spectral Doppler, color Doppler, and power Doppler as needed of all accessible portions of each vessel. Bilateral testing is considered an integral part of a complete examination. Limited examinations for reoccurring indications may be performed as noted. +-----------------+-------------+----------+--------+ Right Cephalic   Diameter (cm)Depth (cm)Findings +-----------------+-------------+----------+--------+ Prox upper arm       0.27                        +-----------------+-------------+----------+--------+ Mid upper arm        0.25                        +-----------------+-------------+----------+--------+ Dist upper arm       0.26                        +-----------------+-------------+----------+--------+ Antecubital fossa    0.24                        +-----------------+-------------+----------+--------+ Prox forearm         0.19                        +-----------------+-------------+----------+--------+ Mid forearm          0.20                        +-----------------+-------------+----------+--------+ Dist forearm         0.15                        +-----------------+-------------+----------+--------+ +-----------------+-------------+----------+--------+ Right Basilic    Diameter (cm)Depth (cm)Findings +-----------------+-------------+----------+--------+ Mid upper arm         0.40                        +-----------------+-------------+----------+--------+ Dist upper arm       0.29                        +-----------------+-------------+----------+--------+ Antecubital fossa    0.27                        +-----------------+-------------+----------+--------+ Prox forearm         0.21                        +-----------------+-------------+----------+--------+ +-----------------+-------------+----------+--------+ Left Cephalic  Diameter (cm)Depth (cm)Findings +-----------------+-------------+----------+--------+ Shoulder             0.23                        +-----------------+-------------+----------+--------+ Prox upper arm       0.20                        +-----------------+-------------+----------+--------+ Mid upper arm        0.24                        +-----------------+-------------+----------+--------+ Dist upper arm       0.22                        +-----------------+-------------+----------+--------+ Antecubital fossa    0.25                        +-----------------+-------------+----------+--------+ Prox forearm         0.27                        +-----------------+-------------+----------+--------+ Mid forearm          0.23                        +-----------------+-------------+----------+--------+ Dist forearm         0.21                        +-----------------+-------------+----------+--------+ +-----------------+-------------+----------+--------+ Left Basilic     Diameter (cm)Depth (cm)Findings +-----------------+-------------+----------+--------+ Mid upper arm        0.29                        +-----------------+-------------+----------+--------+ Dist upper arm       0.35                        +-----------------+-------------+----------+--------+ Antecubital fossa    0.15                        +-----------------+-------------+----------+--------+ Prox forearm          0.16                        +-----------------+-------------+----------+--------+ Summary: Right: The Right Cephaic Vein and Basilic Vein appears to be patent        with no evdience of clot;measurements were obtained as well. Left: The Left Cephaic Vein and Basilic Vein appears to be patent       with no evdience of clot;measurements were obtained as well. *See table(s) above for measurements and observations.  Diagnosing physician: Leotis Pain MD Electronically signed by Leotis Pain MD on 08/05/2021 at 9:26:58 AM.    Final     ASSESSMENT & PLAN:   1. Anemia in stage 4 chronic kidney disease ([place])   2. 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  Probably due to chronic kidney disease, recent Imuran use, autoimmune disease. Imuran has been stopped. Adequate folate and vitamin B12 level.  Normal bilirubin, less likely hemolysis SPEP showed negative M protein.  Light chain ratio 1.64, normal. Labs reviewed and discussed with patient. Hemoglobin is 10.1.  I will hold off Retacrit today. H&H  2 weeks,  4 weeks, 6 weeks, 8 weeks+/- Retacrit.  #ANCA vasculitis, currently off Imuran.  Patient was seen by Dr. Posey Pronto on 07/05/2021 #CKD Stage IV.  Avoid nephro toxin.  Follow-up with nephrology.   Patient will follow-up in 12 weeks.  Labs MD +/- Retacrit.  Earlie Server, MD 08/11/21 8:55 PM

## 2021-08-25 ENCOUNTER — Inpatient Hospital Stay: Payer: Medicare PPO | Attending: Oncology

## 2021-08-25 ENCOUNTER — Other Ambulatory Visit: Payer: Self-pay

## 2021-08-25 ENCOUNTER — Inpatient Hospital Stay: Payer: Medicare PPO

## 2021-08-25 VITALS — BP 129/66 | HR 46 | Resp 18

## 2021-08-25 DIAGNOSIS — I129 Hypertensive chronic kidney disease with stage 1 through stage 4 chronic kidney disease, or unspecified chronic kidney disease: Secondary | ICD-10-CM | POA: Insufficient documentation

## 2021-08-25 DIAGNOSIS — D631 Anemia in chronic kidney disease: Secondary | ICD-10-CM | POA: Diagnosis present

## 2021-08-25 DIAGNOSIS — N189 Chronic kidney disease, unspecified: Secondary | ICD-10-CM

## 2021-08-25 DIAGNOSIS — N184 Chronic kidney disease, stage 4 (severe): Secondary | ICD-10-CM | POA: Insufficient documentation

## 2021-08-25 LAB — HEMOGLOBIN AND HEMATOCRIT, BLOOD
HCT: 30.1 % — ABNORMAL LOW (ref 36.0–46.0)
Hemoglobin: 10 g/dL — ABNORMAL LOW (ref 12.0–15.0)

## 2021-08-25 MED ORDER — EPOETIN ALFA-EPBX 10000 UNIT/ML IJ SOLN
10000.0000 [IU] | Freq: Once | INTRAMUSCULAR | Status: AC
  Administered 2021-08-25: 10000 [IU] via SUBCUTANEOUS

## 2021-09-08 ENCOUNTER — Inpatient Hospital Stay: Payer: Medicare PPO

## 2021-09-08 ENCOUNTER — Other Ambulatory Visit: Payer: Self-pay

## 2021-09-08 VITALS — BP 130/64

## 2021-09-08 DIAGNOSIS — D631 Anemia in chronic kidney disease: Secondary | ICD-10-CM

## 2021-09-08 DIAGNOSIS — I129 Hypertensive chronic kidney disease with stage 1 through stage 4 chronic kidney disease, or unspecified chronic kidney disease: Secondary | ICD-10-CM | POA: Diagnosis not present

## 2021-09-08 LAB — HEMOGLOBIN AND HEMATOCRIT, BLOOD
HCT: 29.2 % — ABNORMAL LOW (ref 36.0–46.0)
Hemoglobin: 9.6 g/dL — ABNORMAL LOW (ref 12.0–15.0)

## 2021-09-08 MED ORDER — EPOETIN ALFA-EPBX 10000 UNIT/ML IJ SOLN
10000.0000 [IU] | Freq: Once | INTRAMUSCULAR | Status: AC
  Administered 2021-09-08: 10000 [IU] via SUBCUTANEOUS
  Filled 2021-09-08: qty 1

## 2021-09-22 ENCOUNTER — Inpatient Hospital Stay (HOSPITAL_BASED_OUTPATIENT_CLINIC_OR_DEPARTMENT_OTHER): Payer: Medicare PPO

## 2021-09-22 ENCOUNTER — Inpatient Hospital Stay: Payer: Medicare PPO | Attending: Oncology

## 2021-09-22 ENCOUNTER — Other Ambulatory Visit: Payer: Self-pay

## 2021-09-22 DIAGNOSIS — N184 Chronic kidney disease, stage 4 (severe): Secondary | ICD-10-CM | POA: Insufficient documentation

## 2021-09-22 DIAGNOSIS — D631 Anemia in chronic kidney disease: Secondary | ICD-10-CM | POA: Insufficient documentation

## 2021-09-22 LAB — HEMOGLOBIN AND HEMATOCRIT, BLOOD
HCT: 31.6 % — ABNORMAL LOW (ref 36.0–46.0)
Hemoglobin: 10.5 g/dL — ABNORMAL LOW (ref 12.0–15.0)

## 2021-09-29 NOTE — Telephone Encounter (Signed)
Done

## 2021-10-04 ENCOUNTER — Other Ambulatory Visit: Payer: Self-pay

## 2021-10-04 ENCOUNTER — Ambulatory Visit (INDEPENDENT_AMBULATORY_CARE_PROVIDER_SITE_OTHER): Payer: Medicare PPO | Admitting: Ophthalmology

## 2021-10-04 ENCOUNTER — Encounter (INDEPENDENT_AMBULATORY_CARE_PROVIDER_SITE_OTHER): Payer: Self-pay | Admitting: Ophthalmology

## 2021-10-04 DIAGNOSIS — E113412 Type 2 diabetes mellitus with severe nonproliferative diabetic retinopathy with macular edema, left eye: Secondary | ICD-10-CM

## 2021-10-04 DIAGNOSIS — E113393 Type 2 diabetes mellitus with moderate nonproliferative diabetic retinopathy without macular edema, bilateral: Secondary | ICD-10-CM

## 2021-10-04 DIAGNOSIS — H353221 Exudative age-related macular degeneration, left eye, with active choroidal neovascularization: Secondary | ICD-10-CM

## 2021-10-04 DIAGNOSIS — H35352 Cystoid macular degeneration, left eye: Secondary | ICD-10-CM

## 2021-10-04 DIAGNOSIS — H353111 Nonexudative age-related macular degeneration, right eye, early dry stage: Secondary | ICD-10-CM | POA: Diagnosis not present

## 2021-10-04 MED ORDER — BEVACIZUMAB 2.5 MG/0.1ML IZ SOSY
2.5000 mg | PREFILLED_SYRINGE | INTRAVITREAL | Status: AC | PRN
  Administered 2021-10-04: 2.5 mg via INTRAVITREAL

## 2021-10-04 NOTE — Progress Notes (Signed)
10/04/2021     CHIEF COMPLAINT Patient presents for  Chief Complaint  Patient presents with   Eye Problem      HISTORY OF PRESENT ILLNESS: Alexandria Moody is a 75 y.o. female who presents to the clinic today for:   HPI   WIP vision changes OS. Pt has noticed a sudden vision change in her left eye. Patient states "My left eye looks like I have a shadow and a squiggly line in it like my right eye had before. It seems like I am having trouble with colors. It is like I am getting black and green mixed up, and red and orange mixed up, but that was with both eyes." Pt states it started a couple weeks ago, constantly bothering her vision.  Last edited by Laurin Coder on 10/04/2021  1:29 PM.      Referring physician: Bubba Camp, FNP [address] [place],  [place] [address]  HISTORICAL INFORMATION:   Selected notes from the MEDICAL RECORD NUMBER       CURRENT MEDICATIONS: No current outpatient medications on file. (Ophthalmic Drugs)   No current facility-administered medications for this visit. (Ophthalmic Drugs)   Current Outpatient Medications (Other)  Medication Sig   acetaminophen (TYLENOL) 325 MG tablet Take by mouth.   albuterol (VENTOLIN HFA) 108 (90 Base) MCG/ACT inhaler Inhale into the lungs. (Patient not taking: Reported on 08/11/2021)   amLODipine (NORVASC) 5 MG tablet Take 1 tablet by mouth 2 (two) times daily.   aspirin 81 MG EC tablet Take by mouth.   azaTHIOprine (IMURAN) 50 MG tablet Take 1 tablet by mouth 2 (two) times daily.   calcitRIOL (ROCALTROL) 0.25 MCG capsule Take by mouth.   Cholecalciferol 125 MCG (5000 UT) capsule Take by mouth.   citalopram (CELEXA) 20 MG tablet Take by mouth.   furosemide (LASIX) 20 MG tablet Take by mouth. (Patient not taking: Reported on 08/11/2021)   gabapentin (NEURONTIN) 100 MG capsule Take 100 mg by mouth as directed. 1-2 capsules (100-200) total by mouth every evening as directed for insomnia   hydrALAZINE  (APRESOLINE) 50 MG tablet Take by mouth in the morning and at bedtime.   insulin glargine, 1 Unit Dial, (TOUJEO SOLOSTAR) 300 UNIT/ML Solostar Pen Inject 32 Units into the skin daily.   Insulin Pen Needle (BD PEN NEEDLE NANO 2ND GEN) 32G X 4 MM MISC daily.   ipratropium (ATROVENT) 0.06 % nasal spray Place into the nose. (Patient not taking: Reported on 08/11/2021)   isosorbide mononitrate (IMDUR) 120 MG 24 hr tablet Take by mouth.   levothyroxine (SYNTHROID) 75 MCG tablet Take by mouth.   linagliptin (TRADJENTA) 5 MG TABS tablet 1 (one) time each day   losartan (COZAAR) 25 MG tablet Take by mouth.   nitroGLYCERIN (NITROSTAT) 0.4 MG SL tablet PLACE 1 TABLET UNDER THE TONGUE EVERY 5 MINUTES AS NEEDED FOR CHEST PAIN. MAY TAKE UP TO 3 DOSES (Patient not taking: Reported on 08/11/2021)   Omega-3 Fatty Acids (FISH OIL) 1000 MG CAPS Take by mouth.   omeprazole (PRILOSEC) 20 MG capsule Take 1 capsule by mouth daily.   ranolazine (RANEXA) 500 MG 12 hr tablet Take by mouth.   rosuvastatin (CRESTOR) 20 MG tablet Take by mouth.   temazepam (RESTORIL) 15 MG capsule Take by mouth. (Patient not taking: Reported on 08/11/2021)   Tiotropium Bromide-Olodaterol 2.5-2.5 MCG/ACT AERS Inhale into the lungs. (Patient not taking: No sig reported)   torsemide (DEMADEX) 20 MG tablet Take by mouth. (Patient  not taking: Reported on 08/11/2021)   No current facility-administered medications for this visit. (Other)      REVIEW OF SYSTEMS: ROS   Negative for: Constitutional, Gastrointestinal, Neurological, Skin, Genitourinary, Musculoskeletal, HENT, Endocrine, Cardiovascular, Eyes, Respiratory, Psychiatric, Allergic/Imm, Heme/Lymph Last edited by Hurman Horn, MD on 10/04/2021  2:01 PM.       ALLERGIES Allergies  Allergen Reactions   Other Other (See Comments)    Tears skin Other reaction(s): Other (See Comments) Tears skin   Tape     Other reaction(s): Other (See Comments) Tears skin Tears skin    PAST  MEDICAL HISTORY Past Medical History:  Diagnosis Date   Diabetes mellitus without complication ([place])    Hypertension    Past Surgical History:  Procedure Laterality Date   CATARACT EXTRACTION Left 2014   EYE SURGERY Left 07/22/2020   Dr. Towanda Octave, [facility]    FAMILY HISTORY History reviewed. No pertinent family history.  SOCIAL HISTORY Social History   Tobacco Use   Smoking status: Never   Smokeless tobacco: Never         OPHTHALMIC EXAM:  Base Eye Exam     Visual Acuity (ETDRS)       Right Left   Dist cc 20/30 -2 20/60 -1   Dist ph cc  20/40    Correction: Glasses         Tonometry (Tonopen, 1:30 PM)       Right Left   Pressure 23 21         Pupils       Pupils Dark Light Shape React APD   Right PERRL 4 3 Round Brisk None   Left PERRL 4 3 Round Brisk None         Extraocular Movement       Right Left    Full Full         Neuro/Psych     Oriented x3: Yes   Mood/Affect: Normal         Dilation     Both eyes: 1.0% Mydriacyl, 2.5% Phenylephrine @ 1:30 PM           Slit Lamp and Fundus Exam     External Exam       Right Left   External Normal Normal         Slit Lamp Exam       Right Left   Lids/Lashes Normal Normal   Conjunctiva/Sclera White and quiet White and quiet   Cornea Clear Clear   Anterior Chamber Deep and quiet Deep and quiet   Iris Round and reactive Round and reactive   Lens Centered posterior chamber intraocular lens Centered posterior chamber intraocular lens   Anterior Vitreous Normal Normal         Fundus Exam       Right Left   Posterior Vitreous Clear, post vitrectomy Clear vitrectomized   Disc Normal Normal   C/D Ratio 0.15 0.15   Macula Small intraretinal hemorrhage, found at time of surgical intervention, likely diabetic related. No topographic distortion now Microaneurysms, no macular thickening, no exudates, no clinically significant macular edema   Vessels NPDR- Moderate  NPDR- Moderate   Periphery Normal Normal            IMAGING AND PROCEDURES  Imaging and Procedures for 10/04/21  Color Fundus Photography Optos - OU - Both Eyes       Right Eye Progression has been stable. Macula : microaneurysms. Vessels : normal  observations. Periphery : normal observations.   Left Eye Progression has been stable. Disc findings include normal observations. Macula : microaneurysms. Periphery : normal observations.   Notes Severe NPDR OU apparent     OCT, Retina - OU - Both Eyes       Right Eye Quality was good. Scan locations included subfoveal. Central Foveal Thickness: 313. Progression has improved. Findings include abnormal foveal contour.   Left Eye Quality was good. Scan locations included subfoveal. Central Foveal Thickness: 330. Progression has been stable. Findings include cystoid macular edema, abnormal foveal contour.   Notes OD with minor intraretinal fluid, status post vitrectomy membrane peel 66much less thickening, much less outer retinal disturbance as compared to epiretinal membrane present November 2021  OS with perifoveal thickening with intraretinal fluid and extension to the outer retina inferonasal to FAZ consistent with a wrap like lesion when correlated with clinical findings on macular evaluation     Intravitreal Injection, Pharmacologic Agent - OS - Left Eye       Time Out 10/04/2021. 2:08 PM. Confirmed correct patient, procedure, site, and patient consented.   Anesthesia Topical anesthesia was used. Anesthetic medications included Lidocaine 4%.   Procedure Preparation included Ofloxacin , Tobramycin 0.3%, 10% betadine to eyelids, 5% betadine to ocular surface. A 30 gauge needle was used.   Injection: 2.5 mg bevacizumab 2.5 MG/0.1ML   Route: Intravitreal, Site: Left Eye   NDC: [phone], Lot: [id number]   Post-op Post injection exam found visual acuity of at least counting fingers. The patient tolerated the  procedure well. There were no complications. The patient received written and verbal post procedure care education. Post injection medications included ocuflox.              ASSESSMENT/PLAN:  Nonexudative age-related macular degeneration, right eye, early dry stage No signs of CNVM OD  Exudative age-related macular degeneration of left eye with active choroidal neovascularization (HCC) We will commence with therapy with intravitreal Avastin today OS and follow-up again in 5 to 6 weeks  Severe nonproliferative diabetic retinopathy of left eye, with macular edema, associated with type 2 diabetes mellitus (HCC) CSME OS in conjunction with wet AMD.  We will treat the same with intravitreal Avastin commence therapy today     ICD-10-CM   1. Exudative age-related macular degeneration of left eye with active choroidal neovascularization (HCC)  H35.3221 Color Fundus Photography Optos - OU - Both Eyes    Intravitreal Injection, Pharmacologic Agent - OS - Left Eye    bevacizumab (AVASTIN) SOSY 2.5 mg    2. Cystoid macular edema of left eye  H35.352 Color Fundus Photography Optos - OU - Both Eyes    OCT, Retina - OU - Both Eyes    3. Nonexudative age-related macular degeneration, right eye, early dry stage  H35.3111     4. Moderate nonproliferative diabetic retinopathy of both eyes without macular edema associated with type 2 diabetes mellitus (HCC)  Y78.2956 Color Fundus Photography Optos - OU - Both Eyes    5. Severe nonproliferative diabetic retinopathy of left eye, with macular edema, associated with type 2 diabetes mellitus ([place])  O13.0865       1.  CME perifoveal OS apparent wrap like lesion form of CNVM and wet AMD, with visual symptoms and apparent change in acuity.  Will treat as wet AMD possible confluence also of diabetic CSME.  2.  Risk benefits reviewed.  We will commence with intravitreal Avastin OS today and follow-up again in 5 weeks  3.  Ophthalmic Meds Ordered this  visit:  Meds ordered this encounter  Medications   bevacizumab (AVASTIN) SOSY 2.5 mg       Return in about 5 weeks (around 11/08/2021) for dilate, OS, AVASTIN OCT.  There are no Patient Instructions on file for this visit.   Explained the diagnoses, plan, and follow up with the patient and they expressed understanding.  Patient expressed understanding of the importance of proper follow up care.   Clent Demark Demonie Kassa M.D. Diseases & Surgery of the Retina and Vitreous [facility] 10/04/21     Abbreviations: M myopia (nearsighted); A astigmatism; H hyperopia (farsighted); P presbyopia; Mrx spectacle prescription;  CTL contact lenses; OD right eye; OS left eye; OU both eyes  XT exotropia; ET esotropia; PEK punctate epithelial keratitis; PEE punctate epithelial erosions; DES dry eye syndrome; MGD meibomian gland dysfunction; ATs artificial tears; PFAT's preservative free artificial tears; [place] nuclear sclerotic cataract; PSC posterior subcapsular cataract; ERM epi-retinal membrane; PVD posterior vitreous detachment; RD retinal detachment; DM diabetes mellitus; DR diabetic retinopathy; NPDR non-proliferative diabetic retinopathy; PDR proliferative diabetic retinopathy; CSME clinically significant macular edema; DME diabetic macular edema; dbh dot blot hemorrhages; CWS cotton wool spot; POAG primary open angle glaucoma; C/D cup-to-disc ratio; HVF humphrey visual field; GVF goldmann visual field; OCT optical coherence tomography; IOP intraocular pressure; BRVO Branch retinal vein occlusion; CRVO central retinal vein occlusion; CRAO central retinal artery occlusion; BRAO branch retinal artery occlusion; RT retinal tear; SB scleral buckle; PPV pars plana vitrectomy; VH Vitreous hemorrhage; PRP panretinal laser photocoagulation; IVK intravitreal kenalog; VMT vitreomacular traction; MH Macular hole;  NVD neovascularization of the disc; NVE neovascularization elsewhere; AREDS age related eye  disease study; ARMD age related macular degeneration; POAG primary open angle glaucoma; EBMD epithelial/anterior basement membrane dystrophy; ACIOL anterior chamber intraocular lens; IOL intraocular lens; PCIOL posterior chamber intraocular lens; Phaco/IOL phacoemulsification with intraocular lens placement; [place] photorefractive keratectomy; LASIK laser assisted in situ keratomileusis; HTN hypertension; DM diabetes mellitus; COPD chronic obstructive pulmonary disease

## 2021-10-04 NOTE — Assessment & Plan Note (Signed)
CSME OS in conjunction with wet AMD.  We will treat the same with intravitreal Avastin commence therapy today

## 2021-10-04 NOTE — Assessment & Plan Note (Signed)
No signs of CNVM OD 

## 2021-10-04 NOTE — Assessment & Plan Note (Signed)
We will commence with therapy with intravitreal Avastin today OS and follow-up again in 5 to 6 weeks

## 2021-10-06 ENCOUNTER — Inpatient Hospital Stay: Payer: Medicare PPO

## 2021-10-06 ENCOUNTER — Other Ambulatory Visit: Payer: Self-pay

## 2021-10-06 VITALS — BP 114/54 | HR 42 | Temp 97.0°F | Resp 20

## 2021-10-06 DIAGNOSIS — N184 Chronic kidney disease, stage 4 (severe): Secondary | ICD-10-CM | POA: Diagnosis not present

## 2021-10-06 DIAGNOSIS — D631 Anemia in chronic kidney disease: Secondary | ICD-10-CM

## 2021-10-06 DIAGNOSIS — N189 Chronic kidney disease, unspecified: Secondary | ICD-10-CM

## 2021-10-06 LAB — HEMOGLOBIN AND HEMATOCRIT, BLOOD
HCT: 28.9 % — ABNORMAL LOW (ref 36.0–46.0)
Hemoglobin: 9.3 g/dL — ABNORMAL LOW (ref 12.0–15.0)

## 2021-10-06 MED ORDER — EPOETIN ALFA-EPBX 10000 UNIT/ML IJ SOLN
10000.0000 [IU] | Freq: Once | INTRAMUSCULAR | Status: AC
  Administered 2021-10-06: 10000 [IU] via SUBCUTANEOUS

## 2021-10-20 ENCOUNTER — Other Ambulatory Visit: Payer: Self-pay

## 2021-10-20 ENCOUNTER — Inpatient Hospital Stay: Payer: Medicare PPO | Attending: Oncology

## 2021-10-20 ENCOUNTER — Other Ambulatory Visit: Payer: Medicare PPO

## 2021-10-20 ENCOUNTER — Ambulatory Visit: Payer: Medicare PPO | Admitting: Oncology

## 2021-10-20 ENCOUNTER — Encounter: Payer: Self-pay | Admitting: Oncology

## 2021-10-20 ENCOUNTER — Ambulatory Visit: Payer: Medicare PPO

## 2021-10-20 ENCOUNTER — Inpatient Hospital Stay: Payer: Medicare PPO | Admitting: Oncology

## 2021-10-20 ENCOUNTER — Inpatient Hospital Stay: Payer: Medicare PPO

## 2021-10-20 VITALS — BP 154/68 | HR 41 | Temp 96.9°F | Wt 152.0 lb

## 2021-10-20 DIAGNOSIS — I7782 Antineutrophilic cytoplasmic antibody (ANCA) vasculitis: Secondary | ICD-10-CM | POA: Insufficient documentation

## 2021-10-20 DIAGNOSIS — N189 Chronic kidney disease, unspecified: Secondary | ICD-10-CM

## 2021-10-20 DIAGNOSIS — D539 Nutritional anemia, unspecified: Secondary | ICD-10-CM

## 2021-10-20 DIAGNOSIS — D631 Anemia in chronic kidney disease: Secondary | ICD-10-CM | POA: Insufficient documentation

## 2021-10-20 DIAGNOSIS — N184 Chronic kidney disease, stage 4 (severe): Secondary | ICD-10-CM | POA: Insufficient documentation

## 2021-10-20 LAB — CBC WITH DIFFERENTIAL/PLATELET
Abs Immature Granulocytes: 0.04 10*3/uL (ref 0.00–0.07)
Basophils Absolute: 0 10*3/uL (ref 0.0–0.1)
Basophils Relative: 0 %
Eosinophils Absolute: 0 10*3/uL (ref 0.0–0.5)
Eosinophils Relative: 0 %
HCT: 28.3 % — ABNORMAL LOW (ref 36.0–46.0)
Hemoglobin: 8.9 g/dL — ABNORMAL LOW (ref 12.0–15.0)
Immature Granulocytes: 1 %
Lymphocytes Relative: 19 %
Lymphs Abs: 1.1 10*3/uL (ref 0.7–4.0)
MCH: 32.8 pg (ref 26.0–34.0)
MCHC: 31.4 g/dL (ref 30.0–36.0)
MCV: 104.4 fL — ABNORMAL HIGH (ref 80.0–100.0)
Monocytes Absolute: 0.4 10*3/uL (ref 0.1–1.0)
Monocytes Relative: 8 %
Neutro Abs: 4 10*3/uL (ref 1.7–7.7)
Neutrophils Relative %: 72 %
Platelets: 195 10*3/uL (ref 150–400)
RBC: 2.71 MIL/uL — ABNORMAL LOW (ref 3.87–5.11)
RDW: 13.5 % (ref 11.5–15.5)
WBC: 5.6 10*3/uL (ref 4.0–10.5)
nRBC: 0 % (ref 0.0–0.2)

## 2021-10-20 LAB — IRON AND TIBC
Iron: 52 ug/dL (ref 28–170)
Saturation Ratios: 24 % (ref 10.4–31.8)
TIBC: 218 ug/dL — ABNORMAL LOW (ref 250–450)
UIBC: 166 ug/dL

## 2021-10-20 LAB — FERRITIN: Ferritin: 172 ng/mL (ref 11–307)

## 2021-10-20 MED ORDER — EPOETIN ALFA-EPBX 10000 UNIT/ML IJ SOLN
20000.0000 [IU] | Freq: Once | INTRAMUSCULAR | Status: AC
  Administered 2021-10-20: 20000 [IU] via SUBCUTANEOUS
  Filled 2021-10-20: qty 2

## 2021-10-20 MED ORDER — FOLIC ACID 400 MCG PO TABS: 400.0000 ug | ORAL_TABLET | Freq: Every day | ORAL | 0 refills | Status: DC

## 2021-10-20 NOTE — Progress Notes (Signed)
FOLLOW UP NOTE  Patient Care Team: Earlie Server, MD as PCP - General (Oncology)  CHIEF COMPLAINTS/PURPOSE OF CONSULTATION: Anemia  HISTORY OF PRESENTING ILLNESS:  Alexandria Moody 75 y.o. female presents for follow-up  PERTINENT HEMATOLOGY HISTORY Patient previously was seen by nurse practitioner Faythe Casa, patient switched care to me on 05/05/21 Extensive medical record review was performed by me  PMH  significant for chronic kidney disease stage IV, prior history of ANCA vasculitis, diabetes type 2, hypertension, PVD, hypothyroidism, GERD chronically secondary to chronic kidney disease.  04/05/2021 She evaluated by Dr. Posey Pronto on  for joint pain and found to have a large drop in her hemoglobin.  She had been seen 1 day prior by Dr. Holley Raring with a reported hemoglobin of 10.3.  She just moved to the area from [facility].  She has previously received blood transfusions, iron and Epogen.    Patient was seen by NP Faythe Casa on 04/12/2021.  Anemia was felt and the patient was started on replacement therapy.  04/20/2021 Retacrit 10,000 units  04/28/2021 iron panel ferritin 305, iron saturation 24.  Patient received 1 dose of IV Venofer treatment. 05/03/2021, IV Venofer x1 05/05/2021, hemoglobin was 7.4, Retacrit 10,000 units.  05/11/2021, hemoglobin has improved to 8.3.  Hold off blood transfusion and Retacrit today.  Patient reports that appetite is fair.  Weight has been stable. She is not able to tolerate oral iron supplementation due to GI toxicities.  She receives monthly vitamin B12 injections through her PCP  Reports history of EGD/colonoscopy many years ago.  No recent EGD/colonoscopy.  INTERVAL HISTORY Alexandria Moody is a 75 y.o. female who has above history reviewed by me today presents for follow up visit for management of anemia Patient has been on H&H every 2 weeks +/- Retacrit if hemoglobin is less than 10 Patient recently was seen by rheumatology and was started on  Plaquenil for vasculitis.  She reports that she has not felt any symptom relief and since the start of Plaquenil.  Chronic fatigue, unchanged.   MEDICAL HISTORY:  Past Medical History:  Diagnosis Date   Diabetes mellitus without complication ([place])    Hypertension     SURGICAL HISTORY: Past Surgical History:  Procedure Laterality Date   CATARACT EXTRACTION Left 2014   EYE SURGERY Left 07/22/2020   Dr. Towanda Octave, [facility]    SOCIAL HISTORY: Social History   Socioeconomic History   Marital status: Widowed    Spouse name: Not on file   Number of children: Not on file   Years of education: Not on file   Highest education level: Not on file  Occupational History   Not on file  Tobacco Use   Smoking status: Never   Smokeless tobacco: Never  Substance and Sexual Activity   Alcohol use: Not on file   Drug use: Not on file   Sexual activity: Not on file  Other Topics Concern   Not on file  Social History Narrative   Not on file   Social Determinants of Health   Financial Resource Strain: Not on file  Food Insecurity: Not on file  Transportation Needs: Not on file  Physical Activity: Not on file  Stress: Not on file  Social Connections: Not on file  Intimate Partner Violence: Not on file    FAMILY HISTORY: History reviewed. No pertinent family history.  ALLERGIES:  is allergic to other and tape.  MEDICATIONS:  Current Outpatient Medications  Medication Sig Dispense Refill  acetaminophen (TYLENOL) 325 MG tablet Take by mouth.     amLODipine (NORVASC) 5 MG tablet Take 1 tablet by mouth 2 (two) times daily.     aspirin 81 MG EC tablet Take by mouth.     calcitRIOL (ROCALTROL) 0.25 MCG capsule Take by mouth.     Cholecalciferol 125 MCG (5000 UT) capsule Take by mouth.     citalopram (CELEXA) 20 MG tablet Take by mouth.     folic acid (V-R FOLIC ACID) 662 MCG tablet Take 1 tablet (400 mcg total) by mouth daily. 30 tablet 0   gabapentin (NEURONTIN) 100 MG  capsule Take 100 mg by mouth as directed. 1-2 capsules (100-200) total by mouth every evening as directed for insomnia     hydrALAZINE (APRESOLINE) 50 MG tablet Take by mouth in the morning and at bedtime.     insulin glargine, 1 Unit Dial, (TOUJEO SOLOSTAR) 300 UNIT/ML Solostar Pen Inject 32 Units into the skin daily.     Insulin Pen Needle (BD PEN NEEDLE NANO 2ND GEN) 32G X 4 MM MISC daily.     isosorbide mononitrate (IMDUR) 120 MG 24 hr tablet Take by mouth.     levothyroxine (SYNTHROID) 75 MCG tablet Take by mouth.     linagliptin (TRADJENTA) 5 MG TABS tablet 1 (one) time each day     losartan (COZAAR) 25 MG tablet Take by mouth.     Omega-3 Fatty Acids (FISH OIL) 1000 MG CAPS Take by mouth.     omeprazole (PRILOSEC) 20 MG capsule Take 1 capsule by mouth daily.     ranolazine (RANEXA) 500 MG 12 hr tablet Take by mouth.     rosuvastatin (CRESTOR) 20 MG tablet Take by mouth.     Tiotropium Bromide-Olodaterol 2.5-2.5 MCG/ACT AERS Inhale into the lungs.     torsemide (DEMADEX) 20 MG tablet Take by mouth.     albuterol (VENTOLIN HFA) 108 (90 Base) MCG/ACT inhaler Inhale into the lungs. (Patient not taking: No sig reported)     ipratropium (ATROVENT) 0.06 % nasal spray Place into the nose. (Patient not taking: No sig reported)     nitroGLYCERIN (NITROSTAT) 0.4 MG SL tablet PLACE 1 TABLET UNDER THE TONGUE EVERY 5 MINUTES AS NEEDED FOR CHEST PAIN. MAY TAKE UP TO 3 DOSES (Patient not taking: No sig reported)     temazepam (RESTORIL) 15 MG capsule Take by mouth. (Patient not taking: No sig reported)     No current facility-administered medications for this visit.    REVIEW OF SYSTEMS:   Review of Systems  Constitutional:  Negative for chills, fever, malaise/fatigue and weight loss.  HENT:  Negative for congestion, ear pain, nosebleeds and tinnitus.   Eyes: Negative.  Negative for blurred vision and double vision.  Respiratory:  Negative for cough, sputum production and shortness of breath.    Cardiovascular: Negative.  Negative for chest pain, palpitations and leg swelling.  Gastrointestinal: Negative.  Negative for abdominal pain, constipation, diarrhea, nausea and vomiting.  Genitourinary:  Negative for dysuria, frequency and urgency.  Musculoskeletal:  Negative for back pain and falls.  Skin: Negative.  Negative for rash.  Neurological:  Negative for dizziness, weakness and headaches.  Endo/Heme/Allergies: Negative.  Does not bruise/bleed easily.  Psychiatric/Behavioral:  Negative for depression. The patient is not nervous/anxious and does not have insomnia.    PHYSICAL EXAMINATION: ECOG PERFORMANCE STATUS: 1 - Symptomatic but completely ambulatory  Vitals:   10/20/21 1417  BP: (!) 154/68  Pulse: (!) 41  Temp: Marland Kitchen)  96.9 F (36.1 C)   Filed Weights   10/20/21 1417  Weight: 152 lb (68.9 kg)    Physical Exam Constitutional:      Comments: Frail appearance, thin built, ambulates independently  HENT:     Head: Normocephalic and atraumatic.  Eyes:     Pupils: Pupils are equal, round, and reactive to light.  Cardiovascular:     Rate and Rhythm: Normal rate and regular rhythm.     Heart sounds: Murmur heard.  Pulmonary:     Effort: Pulmonary effort is normal.     Breath sounds: Normal breath sounds. No wheezing.  Abdominal:     General: Bowel sounds are normal. There is no distension.     Palpations: Abdomen is soft.     Tenderness: There is no abdominal tenderness.  Musculoskeletal:        General: Normal range of motion.     Cervical back: Normal range of motion.  Skin:    General: Skin is warm and dry.     Findings: No rash.  Neurological:     Mental Status: She is alert and oriented to person, place, and time.  Psychiatric:        Mood and Affect: Mood normal.    LABORATORY DATA:  I have reviewed the data as listed CBC    Component Value Date/Time   WBC 5.6 10/20/2021 1401   RBC 2.71 (L) 10/20/2021 1401   HGB 8.9 (L) 10/20/2021 1401   HCT 28.3  (L) 10/20/2021 1401   PLT 195 10/20/2021 1401   MCV 104.4 (H) 10/20/2021 1401   MCH 32.8 10/20/2021 1401   MCHC 31.4 10/20/2021 1401   RDW 13.5 10/20/2021 1401   LYMPHSABS 1.1 10/20/2021 1401   MONOABS 0.4 10/20/2021 1401   EOSABS 0.0 10/20/2021 1401   BASOSABS 0.0 10/20/2021 1401   CMP Latest Ref Rng & Units 05/05/2021 05/03/2021 04/28/2021  Glucose 70 - 99 mg/dL 146(H) 138(H) 101(H)  BUN 8 - 23 mg/dL 57(H) 49(H) 56(H)  Creatinine 0.44 - 1.00 mg/dL 2.99(H) 3.07(H) 3.01(H)  Sodium 135 - 145 mmol/L 135 137 135  Potassium 3.5 - 5.1 mmol/L 4.5 4.8 4.9  Chloride 98 - 111 mmol/L 108 108 106  CO2 22 - 32 mmol/L 20(L) 21(L) 20(L)  Calcium 8.9 - 10.3 mg/dL 8.3(L) 8.4(L) 8.4(L)  Total Protein 6.5 - 8.1 g/dL 6.7 6.8 6.9  Total Bilirubin 0.3 - 1.2 mg/dL 0.7 0.7 0.8  Alkaline Phos 38 - 126 U/L 108 99 114  AST 15 - 41 U/L 20 21 18   ALT 0 - 44 U/L 12 13 12     RADIOGRAPHIC STUDIES: I have personally reviewed the radiological images as listed and agreed with the findings in the report. US Carotid Bilateral  Result Date: 07/25/2021 CLINICAL DATA:  Carotid atherosclerosis history of bilateral endarterectomy, asymptomatic carotid bruit EXAM: BILATERAL CAROTID DUPLEX ULTRASOUND TECHNIQUE: Pearline Cables scale imaging, color Doppler and duplex ultrasound were performed of bilateral carotid and vertebral arteries in the neck. COMPARISON:  None available FINDINGS: Criteria: Quantification of carotid stenosis is based on velocity parameters that correlate the residual internal carotid diameter with NASCET-based stenosis levels, using the diameter of the distal internal carotid lumen as the denominator for stenosis measurement. The following velocity measurements were obtained: RIGHT ICA: 117/20 cm/sec CCA: 07/37 cm/sec SYSTOLIC ICA/CCA RATIO:  1.2 ECA: 118 cm/sec LEFT ICA: 128/22 cm/sec CCA: 106/26 cm/sec SYSTOLIC ICA/CCA RATIO:  1.2 ECA: 172 cm/sec RIGHT CAROTID ARTERY: Mildly ectatic carotid bifurcation and scattered  atherosclerotic  change. Negative for stenosis, velocity elevation, or turbulent flow. Degree of narrowing less than 50% by ultrasound criteria. RIGHT VERTEBRAL ARTERY:  Antegrade flow LEFT CAROTID ARTERY: Scattered atherosclerotic changes and mild ectasia. Negative for significant stenosis, velocity elevation, or turbulent flow. Degree of narrowing also less than 50% by ultrasound criteria. LEFT VERTEBRAL ARTERY:  Antegrade flow IMPRESSION: Bilateral carotid atherosclerosis. Negative for stenosis. Degree of narrowing less than 50% bilaterally by ultrasound criteria. Patent antegrade vertebral flow bilaterally Electronically Signed   By: Jerilynn Mages.  Shick M.D.   On: 07/25/2021 11:56   VAS [place] UPPER EXTREMITY ARTERIAL DUPLEX  Result Date: 07/26/2021  UPPER EXTREMITY DUPLEX STUDY Patient Name:  COLANDRA OHANIAN  Date of Exam:   07/26/2021 Medical Rec #: [id number]     Accession #:    [id number] Date of Birth: 02-15-1946     Patient Gender: F Patient Age:   33 years Exam Location:  [facility] Procedure:      VAS [place] UPPER EXTREMITY ARTERIAL DUPLEX Referring Phys: Eulogio Ditch --------------------------------------------------------------------------------  Indications: Pre assess for HDA.  Performing Technologist: Almira Coaster RVS  Examination Guidelines: A complete evaluation includes B-mode imaging, spectral Doppler, color Doppler, and power Doppler as needed of all accessible portions of each vessel. Bilateral testing is considered an integral part of a complete examination. Limited examinations for reoccurring indications may be performed as noted.  Right Pre-Dialysis Findings: +-----------------------+----------+--------------------+---------+--------+ Location               PSV (cm/s)Intralum. Diam. (cm)Waveform Comments +-----------------------+----------+--------------------+---------+--------+ Brachial Antecub. fossa116       0.49                triphasic          +-----------------------+----------+--------------------+---------+--------+ Radial Art at Wrist    104       0.20                triphasic         +-----------------------+----------+--------------------+---------+--------+ Ulnar Art at Wrist     141       0.18                                  +-----------------------+----------+--------------------+---------+--------+  Left Pre-Dialysis Findings: +-----------------------+----------+--------------------+---------+--------+ Location               PSV (cm/s)Intralum. Diam. (cm)Waveform Comments +-----------------------+----------+--------------------+---------+--------+ Brachial Antecub. fossa101       0.44                triphasic         +-----------------------+----------+--------------------+---------+--------+ Radial Art at Wrist    92        0.26                triphasic         +-----------------------+----------+--------------------+---------+--------+ Ulnar Art at Wrist     114       0.18                triphasic         +-----------------------+----------+--------------------+---------+--------+  Summary  Right: The Right Radial Artery, Ulnar Artery and Brachial Artery        display Triphasic Waveforms. Left: The Left Radial Artery, Ulnar Artery and Brachial Artery display Triphasic Waveforms. Electronically signed by Leotis Pain MD on 07/26/2021 at 5:10:10 PM.    Final    Intravitreal Injection, Pharmacologic Agent - OS - Left Eye  Result Date: 10/04/2021 Time Out  10/04/2021. 2:08 PM. Confirmed correct patient, procedure, site, and patient consented. Anesthesia Topical anesthesia was used. Anesthetic medications included Lidocaine 4%. Procedure Preparation included Ofloxacin , Tobramycin 0.3%, 10% betadine to eyelids, 5% betadine to ocular surface. A 30 gauge needle was used. Injection: 2.5 mg bevacizumab 2.5 MG/0.1ML   Route: Intravitreal, Site: Left Eye   NDC: 7312906445, Lot: [id number] Post-op Post injection exam  found visual acuity of at least counting fingers. The patient tolerated the procedure well. There were no complications. The patient received written and verbal post procedure care education. Post injection medications included ocuflox.   OCT, Retina - OU - Both Eyes  Result Date: 10/04/2021 Right Eye Quality was good. Scan locations included subfoveal. Central Foveal Thickness: 313. Progression has improved. Findings include abnormal foveal contour. Left Eye Quality was good. Scan locations included subfoveal. Central Foveal Thickness: 330. Progression has been stable. Findings include cystoid macular edema, abnormal foveal contour. Notes OD with minor intraretinal fluid, status post vitrectomy membrane peel 30much less thickening, much less outer retinal disturbance as compared to epiretinal membrane present November 2021 OS with perifoveal thickening with intraretinal fluid and extension to the outer retina inferonasal to FAZ consistent with a wrap like lesion when correlated with clinical findings on macular evaluation  Color Fundus Photography Optos - OU - Both Eyes  Result Date: 10/04/2021 Right Eye Progression has been stable. Macula : microaneurysms. Vessels : normal observations. Periphery : normal observations. Left Eye Progression has been stable. Disc findings include normal observations. Macula : microaneurysms. Periphery : normal observations. Notes Severe NPDR OU apparent  VAS [place] UPPER EXT VEIN MAPPING (PRE-OP AVF)  Result Date: 08/05/2021 UPPER EXTREMITY VEIN MAPPING Patient Name:  ROANNE HAYE  Date of Exam:   07/26/2021 Medical Rec #: [id number]     Accession #:    [id number] Date of Birth: 1946-10-09     Patient Gender: F Patient Age:   63 years Exam Location:  [facility] Procedure:      VAS [place] UPPER EXT VEIN MAPPING (PRE-OP AVF) Referring Phys: Eulogio Ditch --------------------------------------------------------------------------------  Indications: Pre-access. Performing  Technologist: Almira Coaster RVS  Examination Guidelines: A complete evaluation includes B-mode imaging, spectral Doppler, color Doppler, and power Doppler as needed of all accessible portions of each vessel. Bilateral testing is considered an integral part of a complete examination. Limited examinations for reoccurring indications may be performed as noted. +-----------------+-------------+----------+--------+ Right Cephalic   Diameter (cm)Depth (cm)Findings +-----------------+-------------+----------+--------+ Prox upper arm       0.27                        +-----------------+-------------+----------+--------+ Mid upper arm        0.25                        +-----------------+-------------+----------+--------+ Dist upper arm       0.26                        +-----------------+-------------+----------+--------+ Antecubital fossa    0.24                        +-----------------+-------------+----------+--------+ Prox forearm         0.19                        +-----------------+-------------+----------+--------+ Mid forearm          0.20                        +-----------------+-------------+----------+--------+  Dist forearm         0.15                        +-----------------+-------------+----------+--------+ +-----------------+-------------+----------+--------+ Right Basilic    Diameter (cm)Depth (cm)Findings +-----------------+-------------+----------+--------+ Mid upper arm        0.40                        +-----------------+-------------+----------+--------+ Dist upper arm       0.29                        +-----------------+-------------+----------+--------+ Antecubital fossa    0.27                        +-----------------+-------------+----------+--------+ Prox forearm         0.21                        +-----------------+-------------+----------+--------+ +-----------------+-------------+----------+--------+ Left Cephalic     Diameter (cm)Depth (cm)Findings +-----------------+-------------+----------+--------+ Shoulder             0.23                        +-----------------+-------------+----------+--------+ Prox upper arm       0.20                        +-----------------+-------------+----------+--------+ Mid upper arm        0.24                        +-----------------+-------------+----------+--------+ Dist upper arm       0.22                        +-----------------+-------------+----------+--------+ Antecubital fossa    0.25                        +-----------------+-------------+----------+--------+ Prox forearm         0.27                        +-----------------+-------------+----------+--------+ Mid forearm          0.23                        +-----------------+-------------+----------+--------+ Dist forearm         0.21                        +-----------------+-------------+----------+--------+ +-----------------+-------------+----------+--------+ Left Basilic     Diameter (cm)Depth (cm)Findings +-----------------+-------------+----------+--------+ Mid upper arm        0.29                        +-----------------+-------------+----------+--------+ Dist upper arm       0.35                        +-----------------+-------------+----------+--------+ Antecubital fossa    0.15                        +-----------------+-------------+----------+--------+ Prox forearm         0.16                        +-----------------+-------------+----------+--------+  Summary: Right: The Right Cephaic Vein and Basilic Vein appears to be patent        with no evdience of clot;measurements were obtained as well. Left: The Left Cephaic Vein and Basilic Vein appears to be patent       with no evdience of clot;measurements were obtained as well. *See table(s) above for measurements and observations.  Diagnosing physician: Leotis Pain MD Electronically signed by Leotis Pain MD on 08/05/2021 at 9:26:58 AM.    Final     ASSESSMENT & PLAN:   1. Anemia in stage 4 chronic kidney disease ([place])   2. ANCA-positive vasculitis    #Macrocytic anemia, Patient had a stable hemoglobin around 10 in August 2022.  Since the start of Plaquenil, we observed a decline of her hemoglobin level. Probably due to the bone marrow suppression effect from Plaquenil.  Close monitor. Labs are reviewed and discussed with patient. Iron panel showed stable iron store. Proceed with Retacrit today, I will increase dose to 20,000 units. Chronic macrocytosis.  She has had normal B12 and folate previously. Given that patient has a murmur, I recommend patient to take empiric folic acid 031 MCG daily.  Prescription sent to pharmacy.  Patient will buy over-the-counter supply in the future.  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  Continue follow-up with rheumatology. #CKD Stage IV.  Avoid nephro toxin.  Follow-up with nephrology.   Patient will follow-up H&H in 2, 4, 6, 8, 10 weeks +/- Retacrit  Follow-up in 12 weeks.  Labs MD +/- Retacrit.  Earlie Server, MD 10/20/21 9:07 PM

## 2021-10-27 ENCOUNTER — Encounter: Payer: Self-pay | Admitting: Oncology

## 2021-10-27 ENCOUNTER — Ambulatory Visit: Payer: Medicare PPO | Attending: Internal Medicine

## 2021-10-27 ENCOUNTER — Other Ambulatory Visit: Payer: Self-pay

## 2021-10-27 DIAGNOSIS — Z23 Encounter for immunization: Secondary | ICD-10-CM

## 2021-10-27 MED ORDER — MODERNA COVID-19 BIVAL BOOSTER 50 MCG/0.5ML IM SUSP
INTRAMUSCULAR | 0 refills | Status: DC
  Filled 2021-10-27: qty 0.5, 1d supply, fill #0

## 2021-10-27 NOTE — Progress Notes (Signed)
   Covid-19 Vaccination Clinic  Name:  Arminta Gamm    MRN: [id number] DOB: 09-12-1946  10/27/2021  [name] was observed post Covid-19 immunization for 15 minutes without incident. She was provided with Vaccine Information Sheet and instruction to access the V-Safe system.   [name] was instructed to call 911 with any severe reactions post vaccine: Difficulty breathing  Swelling of face and throat  A fast heartbeat  A bad rash all over body  Dizziness and weakness   Immunizations Administered     Name Date Dose VIS Date Route   Moderna Covid-19 vaccine Bivalent Booster 10/27/2021 11:55 AM 0.5 mL 07/30/2021 Intramuscular   Manufacturer: Moderna   Lot: [id number]   [place]: [id number]

## 2021-11-03 ENCOUNTER — Inpatient Hospital Stay: Payer: Medicare PPO

## 2021-11-03 ENCOUNTER — Other Ambulatory Visit: Payer: Self-pay

## 2021-11-03 VITALS — BP 163/79 | HR 54

## 2021-11-03 DIAGNOSIS — N184 Chronic kidney disease, stage 4 (severe): Secondary | ICD-10-CM | POA: Diagnosis not present

## 2021-11-03 DIAGNOSIS — N189 Chronic kidney disease, unspecified: Secondary | ICD-10-CM

## 2021-11-03 LAB — HEMOGLOBIN AND HEMATOCRIT, BLOOD
HCT: 28.8 % — ABNORMAL LOW (ref 36.0–46.0)
Hemoglobin: 9 g/dL — ABNORMAL LOW (ref 12.0–15.0)

## 2021-11-03 MED ORDER — EPOETIN ALFA-EPBX 10000 UNIT/ML IJ SOLN
20000.0000 [IU] | Freq: Once | INTRAMUSCULAR | Status: AC
  Administered 2021-11-03: 14:00:00 20000 [IU] via SUBCUTANEOUS
  Filled 2021-11-03: qty 2

## 2021-11-08 ENCOUNTER — Encounter (INDEPENDENT_AMBULATORY_CARE_PROVIDER_SITE_OTHER): Payer: Medicare PPO | Admitting: Ophthalmology

## 2021-11-14 ENCOUNTER — Emergency Department: Payer: Medicare PPO

## 2021-11-14 ENCOUNTER — Emergency Department
Admission: EM | Admit: 2021-11-14 | Discharge: 2021-11-14 | Disposition: A | Discharge status: Home / Self Care | Payer: Medicare PPO | Attending: Emergency Medicine | Admitting: Emergency Medicine

## 2021-11-14 ENCOUNTER — Other Ambulatory Visit: Payer: Self-pay

## 2021-11-14 DIAGNOSIS — N184 Chronic kidney disease, stage 4 (severe): Secondary | ICD-10-CM | POA: Insufficient documentation

## 2021-11-14 DIAGNOSIS — E039 Hypothyroidism, unspecified: Secondary | ICD-10-CM | POA: Insufficient documentation

## 2021-11-14 DIAGNOSIS — Z7951 Long term (current) use of inhaled steroids: Secondary | ICD-10-CM | POA: Insufficient documentation

## 2021-11-14 DIAGNOSIS — R55 Syncope and collapse: Secondary | ICD-10-CM | POA: Diagnosis not present

## 2021-11-14 DIAGNOSIS — I130 Hypertensive heart and chronic kidney disease with heart failure and stage 1 through stage 4 chronic kidney disease, or unspecified chronic kidney disease: Secondary | ICD-10-CM | POA: Diagnosis not present

## 2021-11-14 DIAGNOSIS — E1122 Type 2 diabetes mellitus with diabetic chronic kidney disease: Secondary | ICD-10-CM | POA: Diagnosis not present

## 2021-11-14 DIAGNOSIS — Z7982 Long term (current) use of aspirin: Secondary | ICD-10-CM | POA: Insufficient documentation

## 2021-11-14 DIAGNOSIS — I5032 Chronic diastolic (congestive) heart failure: Secondary | ICD-10-CM | POA: Insufficient documentation

## 2021-11-14 DIAGNOSIS — J449 Chronic obstructive pulmonary disease, unspecified: Secondary | ICD-10-CM | POA: Insufficient documentation

## 2021-11-14 DIAGNOSIS — T40695A Adverse effect of other narcotics, initial encounter: Secondary | ICD-10-CM | POA: Diagnosis not present

## 2021-11-14 DIAGNOSIS — Z7984 Long term (current) use of oral hypoglycemic drugs: Secondary | ICD-10-CM | POA: Insufficient documentation

## 2021-11-14 DIAGNOSIS — T887XXA Unspecified adverse effect of drug or medicament, initial encounter: Secondary | ICD-10-CM

## 2021-11-14 DIAGNOSIS — Z794 Long term (current) use of insulin: Secondary | ICD-10-CM | POA: Diagnosis not present

## 2021-11-14 DIAGNOSIS — Z79899 Other long term (current) drug therapy: Secondary | ICD-10-CM | POA: Diagnosis not present

## 2021-11-14 DIAGNOSIS — R4182 Altered mental status, unspecified: Secondary | ICD-10-CM | POA: Diagnosis present

## 2021-11-14 DIAGNOSIS — I25118 Atherosclerotic heart disease of native coronary artery with other forms of angina pectoris: Secondary | ICD-10-CM | POA: Diagnosis not present

## 2021-11-14 LAB — CBC
HCT: 27.2 % — ABNORMAL LOW (ref 36.0–46.0)
Hemoglobin: 8.8 g/dL — ABNORMAL LOW (ref 12.0–15.0)
MCH: 32.5 pg (ref 26.0–34.0)
MCHC: 32.4 g/dL (ref 30.0–36.0)
MCV: 100.4 fL — ABNORMAL HIGH (ref 80.0–100.0)
Platelets: 221 10*3/uL (ref 150–400)
RBC: 2.71 MIL/uL — ABNORMAL LOW (ref 3.87–5.11)
RDW: 14.2 % (ref 11.5–15.5)
WBC: 5.4 10*3/uL (ref 4.0–10.5)
nRBC: 0 % (ref 0.0–0.2)

## 2021-11-14 LAB — BASIC METABOLIC PANEL
Anion gap: 8 (ref 5–15)
BUN: 49 mg/dL — ABNORMAL HIGH (ref 8–23)
CO2: 16 mmol/L — ABNORMAL LOW (ref 22–32)
Calcium: 8.1 mg/dL — ABNORMAL LOW (ref 8.9–10.3)
Chloride: 110 mmol/L (ref 98–111)
Creatinine, Ser: 2.44 mg/dL — ABNORMAL HIGH (ref 0.44–1.00)
GFR, Estimated: 20 mL/min — ABNORMAL LOW (ref >60)
Glucose, Bld: 208 mg/dL — ABNORMAL HIGH (ref 70–99)
Potassium: 4.6 mmol/L (ref 3.5–5.1)
Sodium: 134 mmol/L — ABNORMAL LOW (ref 135–145)

## 2021-11-14 LAB — TROPONIN I (HIGH SENSITIVITY): Troponin I (High Sensitivity): 14 ng/L (ref <18)

## 2021-11-14 LAB — URINALYSIS, ROUTINE W REFLEX MICROSCOPIC
Bacteria, UA: NONE SEEN
Bilirubin Urine: NEGATIVE
Glucose, UA: NEGATIVE mg/dL
Hgb urine dipstick: NEGATIVE
Ketones, ur: NEGATIVE mg/dL
Leukocytes,Ua: NEGATIVE
Nitrite: NEGATIVE
Protein, ur: 100 mg/dL — AB
Specific Gravity, Urine: 1.015 (ref 1.005–1.030)
pH: 5.5 (ref 5.0–8.0)

## 2021-11-14 NOTE — ED Provider Notes (Signed)
[facility] Emergency Department Provider Note  ____________________________________________  Time seen: Approximately 7:39 PM  I have reviewed the triage vital signs and the nursing notes.   HISTORY  Chief Complaint Weakness    HPI Alexandria Moody is a 75 y.o. female who presents the emergency department after a brief period of altered mental status, slurring words, weakness, near syncope.  Patient was at the dermatologist, had taken 2 5 mg Vicodin and had the above fax.  Symptoms lasted a couple of minutes.  Patient was being seen at dermatology for a procedure.  Prior to the procedure being performed, patient had episode.  Possible loss consciousness for few seconds the patient was confused, weak for a few minutes.  This has completely resolved at this time.  She denied any headache, visual changes, chest pain, shortness of breath at this time.  No URI symptoms.  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   Osteoporosis, 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/2021   Secondary hyperparathyroidism, 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/24/2021   B12 deficiency 02/01/2021   Senile purpura ([place]) 12/24/2020   Carotid atherosclerosis, bilateral 12/21/2020   Chronic diastolic congestive heart failure ([place]) 12/21/2020   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 ([place]) 12/21/2020   GERD (gastroesophageal reflux disease)  12/21/2020   Hypothyroidism (acquired) 12/21/2020   Major depression in full remission ([place]) 12/21/2020   Seborrheic dermatitis 12/21/2020   Sensorineural hearing loss (SNHL) of both ears 12/21/2020   Thyroid disease 12/21/2020   Vitamin D deficiency 12/21/2020   Postoperative follow-up 12/09/2020   Cystoid macular edema of right eye 11/16/2020   Macular pucker, right eye 10/12/2020   Atherosclerosis of abdominal aorta ([place]) 09/10/2020   Bronchiectasis without complication ([place]) 93/81/8299   High risk medication use 09/10/2020   Severe nonproliferative diabetic retinopathy of left eye, with macular edema, associated with type 2 diabetes mellitus ([place]) 09/07/2020   Cystoid macular edema of left eye 09/07/2020   History of vitrectomy 09/07/2020   Both eyes affected by mild nonproliferative diabetic retinopathy with macular edema, associated with type 2 diabetes mellitus ([place]) 09/07/2020   Essential hypertension 11/18/2019   Fatigue 11/18/2019   Stage 3b chronic kidney disease ([place]) 11/18/2019   Non-rheumatic aortic sclerosis 07/22/2018   Adenomatous polyp of colon 10/10/2017   Chronic obstructive pulmonary disease ([place]) 03/28/2017   Osteopenia of multiple sites 06/05/2016   PAD (peripheral artery disease) ([place]) 08/17/2015   Atherosclerosis of native arteries of extremity with intermittent claudication ([place]) 06/29/2015   Myalgia 06/29/2015   Stable angina ([place]) 02/23/2015   TIA (transient ischemic attack) 01/18/2015   Anemia in chronic kidney disease 08/20/2014   Anemia in stage 4 chronic kidney disease ([place]) 08/20/2014   Dizziness 07/24/2014   DOE (dyspnea on exertion) 07/24/2014   Calculus of gallbladder w/o mention of cholecystitis or obstruction 01/05/2014   Contusion of right knee 11/10/2013   Facial contusion 11/10/2013   Fall 11/10/2013   Left elbow pain 11/10/2013   Left radial head fracture  11/10/2013   Right knee pain 11/10/2013   Occlusion and stenosis of carotid  artery 06/23/2013   Stenosis of carotid artery 06/23/2013   Syncope 03/09/2013   Anemia of chronic disease 11/01/2012   Chronic kidney disease, stage 4 (severe) ([place]) 09/11/2012   Anemia 06/24/2012   Type 2 diabetes mellitus ([place]) 07/29/2011   Abnormal cardiovascular stress test 07/29/2011   Coronary artery disease involving native heart 07/29/2011   Hypertension associated with type 2 diabetes mellitus ([place]) 07/29/2011   Hyperlipidemia associated with type 2 diabetes mellitus ([place]) 07/29/2011   Obesity (BMI 30-39.9) 07/29/2011   Situational stress 07/29/2011    Past Surgical History:  Procedure Laterality Date   CATARACT EXTRACTION Left 2014   EYE SURGERY Left 07/22/2020   Dr. Towanda Octave, Vit and Marta Lamas    Prior to Admission medications   Medication Sig Start Date End Date Taking? Authorizing Provider  acetaminophen (TYLENOL) 325 MG tablet Take by mouth.    [provider]  albuterol (VENTOLIN HFA) 108 (90 Base) MCG/ACT inhaler Inhale into the lungs. Patient not taking: No sig reported 07/22/18   [provider]  amLODipine (NORVASC) 5 MG tablet Take 1 tablet by mouth 2 (two) times daily. 10/03/19   [provider]  aspirin 81 MG EC tablet Take by mouth.    [provider]  calcitRIOL (ROCALTROL) 0.25 MCG capsule Take by mouth. 07/20/15   [provider]  Cholecalciferol 125 MCG (5000 UT) capsule Take by mouth.    [provider]  citalopram (CELEXA) 20 MG tablet Take by mouth. 02/26/19   [provider]  COVID-19 mRNA bivalent vaccine, Moderna, (MODERNA COVID-19 BIVAL BOOSTER) 50 MCG/0.5ML injection Inject into the muscle. 10/27/21   Carlyle Basques, MD  folic acid (V-R FOLIC ACID) 250 MCG tablet Take 1 tablet (400 mcg total) by mouth daily. 10/20/21   Earlie Server, MD  gabapentin (NEURONTIN) 100 MG capsule Take 100 mg by mouth as directed. 1-2 capsules (100-200) total by mouth every evening as directed for insomnia    [provider]  hydrALAZINE (APRESOLINE) 50 MG tablet Take by mouth in the morning and at bedtime. 05/18/20   [provider]  insulin glargine, 1 Unit Dial, (TOUJEO SOLOSTAR) 300 UNIT/ML Solostar Pen Inject 32 Units into the skin daily. 03/26/20   [provider]  Insulin Pen Needle (BD PEN NEEDLE NANO 2ND GEN) 32G X 4 MM MISC daily. 05/05/21   [provider]  ipratropium (ATROVENT) 0.06 % nasal spray Place into the nose. Patient not taking: No sig reported 06/01/21 06/01/22  [provider]  isosorbide mononitrate (IMDUR) 120 MG 24 hr tablet Take by mouth. 04/23/20   [provider]  levothyroxine (SYNTHROID) 75 MCG tablet Take by mouth. 04/05/20   [provider]  linagliptin (TRADJENTA) 5 MG TABS tablet 1 (one) time each day 03/09/20   [provider]  losartan (COZAAR) 25 MG tablet Take by mouth. 03/03/21   [provider]  nitroGLYCERIN (NITROSTAT) 0.4 MG SL tablet PLACE 1 TABLET UNDER THE TONGUE EVERY 5 MINUTES AS NEEDED FOR CHEST PAIN. MAY TAKE UP TO 3 DOSES Patient not taking: No sig reported 09/10/20   [provider]  Omega-3 Fatty Acids (FISH OIL) 1000 MG CAPS Take by mouth.    [provider]  omeprazole (PRILOSEC) 20 MG capsule Take 1 capsule by mouth daily. 07/05/18   [provider]  ranolazine (RANEXA) 500 MG 12 hr tablet Take by mouth. 04/05/20  [provider]  rosuvastatin (CRESTOR) 20 MG tablet Take by mouth. 03/31/21   [provider]  temazepam (RESTORIL) 15 MG capsule Take by mouth. Patient not taking: No sig reported    [provider]  Tiotropium Bromide-Olodaterol 2.5-2.5 MCG/ACT AERS Inhale into the lungs.    [provider]  torsemide (DEMADEX) 20 MG tablet Take by mouth. 02/09/21 02/09/22  [provider]    Allergies Other and Tape  No family history on file.  Social History Social History   Tobacco Use   Smoking status: Never    Smokeless tobacco: Never     Review of Systems  Constitutional: No fever/chills Eyes: No visual changes. No discharge ENT: No upper respiratory complaints. Cardiovascular: no chest pain. Respiratory: no cough. No SOB. Gastrointestinal: No abdominal pain.  No nausea, no vomiting.  No diarrhea.  No constipation. Genitourinary: Negative for dysuria. No hematuria Musculoskeletal: Negative for musculoskeletal pain. Skin: Negative for rash, abrasions, lacerations, ecchymosis. Neurological: Negative for headaches, focal weakness or numbness.  Near syncope/syncopal.  For a few seconds, altered mental status, confusion, slurred speech for several minutes  10 System ROS otherwise negative.  ____________________________________________   PHYSICAL EXAM:  VITAL SIGNS: ED Triage Vitals  Enc Vitals Group     BP 11/14/21 1454 (!) 141/50     Pulse Rate 11/14/21 1454 61     Resp 11/14/21 1454 15     Temp 11/14/21 1454 (!) 97.5 F (36.4 C)     Temp Source 11/14/21 1454 Oral     SpO2 11/14/21 1454 96 %     Weight 11/14/21 1455 150 lb (68 kg)     Height 11/14/21 1455 5\' 3"  (1.6 m)     Head Circumference --      Peak Flow --      Pain Score 11/14/21 1440 0     Pain Loc --      Pain Edu? --      Excl. in [place]? --      Constitutional: Alert and oriented. Well appearing and in no acute distress. Eyes: Conjunctivae are normal. PERRL. EOMI. Head: Atraumatic. ENT:      Ears:       Nose: No congestion/rhinnorhea.      Mouth/Throat: Mucous membranes are moist.  Neck: No stridor.  Hematological/Lymphatic/Immunilogical: No cervical lymphadenopathy. Cardiovascular: Normal rate, regular rhythm. Normal S1 and S2.  Good peripheral circulation. Respiratory: Normal respiratory effort without tachypnea or retractions. Lungs CTAB. Good air entry to the bases with no decreased or absent breath sounds. Gastrointestinal: Bowel sounds 4 quadrants. Soft and nontender to palpation. No guarding or rigidity.  No palpable masses. No distention. No CVA tenderness Musculoskeletal: Full range of motion to all extremities. No gross deformities appreciated. Neurologic:  Normal speech and language. No gross focal neurologic deficits are appreciated.  Currently, cranial nerves II through XII grossly intact.  Negative Romberg's and pronator drift.  Equal grip strength upper extremities.  Slight weakness in the left foot when compared with right. Skin:  Skin is warm, dry and intact. No rash noted. Psychiatric: Mood and affect are normal. Speech and behavior are normal. Patient exhibits appropriate insight and judgement.   ____________________________________________   LABS (all labs ordered are listed, but only abnormal results are displayed)  Labs Reviewed  BASIC METABOLIC PANEL - Abnormal; Notable for the following components:      Result Value   Sodium 134 (*)    CO2 16 (*)    Glucose, Bld 208 (*)  BUN 49 (*)    Creatinine, Ser 2.44 (*)    Calcium 8.1 (*)    GFR, Estimated 20 (*)    All other components within normal limits  CBC - Abnormal; Notable for the following components:   RBC 2.71 (*)    Hemoglobin 8.8 (*)    HCT 27.2 (*)    MCV 100.4 (*)    All other components within normal limits  URINALYSIS, ROUTINE W REFLEX MICROSCOPIC - Abnormal; Notable for the following components:   Color, Urine YELLOW (*)    APPearance CLEAR (*)    Protein, ur 100 (*)    All other components within normal limits  CBG MONITORING, ED  TROPONIN I (HIGH SENSITIVITY)  TROPONIN I (HIGH SENSITIVITY)   ____________________________________________  EKG   ____________________________________________  RADIOLOGY I personally viewed and evaluated these images as part of my medical decision making, as well as reviewing the written report by the radiologist.  ED Provider Interpretation: Increased interstitial markings consistent with CHF.  Patient has no acute findings and CT head  DG Chest 2 View  Result  Date: 11/14/2021 CLINICAL DATA:  Near syncope. EXAM: CHEST - 2 VIEW COMPARISON:  None. FINDINGS: Multiple sternal wires and vascular clips are seen. Mild to moderate severity diffusely increased interstitial markings are noted. Very mild atelectasis is seen within the lateral aspect of the bilateral lung bases. There is no evidence of a pleural effusion or pneumothorax. The heart size and mediastinal contours are within normal limits. Moderate to marked severity calcification of the aortic arch is noted. The visualized skeletal structures are unremarkable. IMPRESSION: 1. Evidence of prior median sternotomy/CABG. 2. Additional findings suggestive of mild to moderate severity diffuse interstitial edema. Underlying chronic interstitial lung disease cannot be excluded. Electronically Signed   By: Virgina Norfolk M.D.   On: 11/14/2021 20:03   CT HEAD WO CONTRAST (5MM)  Result Date: 11/14/2021 CLINICAL DATA:  Altered mental status.  Neurologic deficit. EXAM: CT HEAD WITHOUT CONTRAST TECHNIQUE: Contiguous axial images were obtained from the base of the skull through the vertex without intravenous contrast. COMPARISON:  None. FINDINGS: Brain: No acute intracranial hemorrhage. No focal mass lesion. No CT evidence of acute infarction. No midline shift or mass effect. No hydrocephalus. Basilar cisterns are patent. There are mild periventricular and subcortical white matter hypodensities. Generalized cortical atrophy. Vascular: No hyperdense vessel or unexpected calcification. Skull: Normal. Negative for fracture or focal lesion. Sinuses/Orbits: Paranasal sinuses and mastoid air cells are clear. Orbits are clear. Other: None. IMPRESSION: 1. No acute intracranial findings. 2. Mild subcortical white matter microvascular disease. Electronically Signed   By: Suzy Bouchard M.D.   On: 11/14/2021 21:23    ____________________________________________    PROCEDURES  Procedure(s) performed:     Procedures    Medications - No data to display   ____________________________________________   INITIAL IMPRESSION / ASSESSMENT AND PLAN / ED COURSE  Pertinent labs & imaging results that were available during my care of the patient were reviewed by me and considered in my medical decision making (see chart for details).  Review of the [place] CSRS was performed in accordance of the [place] prior to dispensing any controlled drugs.           Patient's diagnosis is consistent with near syncope, medication side effect.  Patient presented to the emergency department with a period where patient almost passed out, was confused and altered for a few minutes.  Patient is narcotic nave, took 10 mg of hydrocodone in preparation  for procedure at her dermatologist office.  30 minutes later patient said that she felt very lightheaded, dizzy.  She states that she did not lose consciousness but felt like "the tunnel was closing in."  Patient states that she could hear everything going on around her but could not respond.  Patient then had a brief period of confusion that has resolved completely at this time.  Patient had work-up to include labs, imaging which were reassuring.  Neuro exam was reassuring.  I suspect that patient episode was linked to the use of the Vicodin she was prescribed.  Again patient does not take narcotics, took 10 mg of hydrocodone 30 minutes prior to the onset of this episode.  TIA is still in the differential but I suspect more so that this was medication side effect.  Return precautions discussed with the patient and her daughter.  Recommend follow-up with primary care.  No prescriptions at this time. Patient is given ED precautions to return to the ED for any worsening or new symptoms.     ____________________________________________  FINAL CLINICAL IMPRESSION(S) / ED DIAGNOSES  Final diagnoses:  Near syncope  Medication side effect      NEW MEDICATIONS STARTED  DURING THIS VISIT:  ED Discharge Orders     None           This chart was dictated using voice recognition software/Dragon. Despite best efforts to proofread, errors can occur which can change the meaning. Any change was purely unintentional.    Darletta Moll, PA-C 11/14/21 2240    Naaman Plummer, MD 11/15/21 [phone]

## 2021-11-14 NOTE — ED Notes (Signed)
Reviewed discharge instructions and follow-up care with patient. Patient verbalized understanding of all information reviewed. Patient stable, with no distress noted at this time.    

## 2021-11-14 NOTE — ED Triage Notes (Signed)
Pt comes via EMS with c/o near syncopal while at dermatologist appt. Pt states she took 2 hydrocodone prior to appt. EMS reports pt was feeling weak and thought sugar may have dropped.   [id number], [id number] 20 g left hand 300 of fluid given

## 2021-11-15 ENCOUNTER — Ambulatory Visit (INDEPENDENT_AMBULATORY_CARE_PROVIDER_SITE_OTHER): Payer: Medicare PPO | Admitting: Ophthalmology

## 2021-11-15 ENCOUNTER — Encounter (INDEPENDENT_AMBULATORY_CARE_PROVIDER_SITE_OTHER): Payer: Self-pay | Admitting: Ophthalmology

## 2021-11-15 DIAGNOSIS — H353221 Exudative age-related macular degeneration, left eye, with active choroidal neovascularization: Secondary | ICD-10-CM

## 2021-11-15 DIAGNOSIS — H35352 Cystoid macular degeneration, left eye: Secondary | ICD-10-CM

## 2021-11-15 DIAGNOSIS — E113412 Type 2 diabetes mellitus with severe nonproliferative diabetic retinopathy with macular edema, left eye: Secondary | ICD-10-CM | POA: Diagnosis not present

## 2021-11-15 MED ORDER — BEVACIZUMAB 2.5 MG/0.1ML IZ SOSY
2.5000 mg | PREFILLED_SYRINGE | INTRAVITREAL | Status: AC | PRN
Start: 2021-11-15 — End: 2021-11-15
  Administered 2021-11-15: 2.5 mg via INTRAVITREAL

## 2021-11-15 NOTE — Assessment & Plan Note (Signed)
Overall severe NPDR stable

## 2021-11-15 NOTE — Progress Notes (Signed)
11/15/2021     CHIEF COMPLAINT Patient presents for  Chief Complaint  Patient presents with   Retina Follow Up      HISTORY OF PRESENT ILLNESS: Alexandria Moody is a 75 y.o. female who presents to the clinic today for:   HPI     Retina Follow Up   Patient presents with  Wet AMD.  In left eye.  This started 6 weeks ago.  Duration of 6 weeks.  Since onset it is stable.        Comments   6 week f/u OS with OCT and possible Avastin injection      Last edited by Reather Littler, COA on 11/15/2021 11:09 AM.      Referring physician: Earlie Server, MD [place],  [place] [address]  HISTORICAL INFORMATION:   Selected notes from the [facility]: No current outpatient medications on file. (Ophthalmic Drugs)   No current facility-administered medications for this visit. (Ophthalmic Drugs)   Current Outpatient Medications (Other)  Medication Sig   acetaminophen (TYLENOL) 325 MG tablet Take by mouth.   albuterol (VENTOLIN HFA) 108 (90 Base) MCG/ACT inhaler Inhale into the lungs. (Patient not taking: No sig reported)   amLODipine (NORVASC) 5 MG tablet Take 1 tablet by mouth 2 (two) times daily.   aspirin 81 MG EC tablet Take by mouth.   calcitRIOL (ROCALTROL) 0.25 MCG capsule Take by mouth.   Cholecalciferol 125 MCG (5000 UT) capsule Take by mouth.   citalopram (CELEXA) 20 MG tablet Take by mouth.   COVID-19 mRNA bivalent vaccine, Moderna, (MODERNA COVID-19 BIVAL BOOSTER) 50 MCG/0.5ML injection Inject into the muscle.   folic acid (V-R FOLIC ACID) 081 MCG tablet Take 1 tablet (400 mcg total) by mouth daily.   gabapentin (NEURONTIN) 100 MG capsule Take 100 mg by mouth as directed. 1-2 capsules (100-200) total by mouth every evening as directed for insomnia   hydrALAZINE (APRESOLINE) 50 MG tablet Take by mouth in the morning and at bedtime.   insulin glargine, 1 Unit Dial, (TOUJEO SOLOSTAR) 300 UNIT/ML Solostar Pen Inject 32  Units into the skin daily.   Insulin Pen Needle (BD PEN NEEDLE NANO 2ND GEN) 32G X 4 MM MISC daily.   ipratropium (ATROVENT) 0.06 % nasal spray Place into the nose. (Patient not taking: No sig reported)   isosorbide mononitrate (IMDUR) 120 MG 24 hr tablet Take by mouth.   levothyroxine (SYNTHROID) 75 MCG tablet Take by mouth.   linagliptin (TRADJENTA) 5 MG TABS tablet 1 (one) time each day   losartan (COZAAR) 25 MG tablet Take by mouth.   nitroGLYCERIN (NITROSTAT) 0.4 MG SL tablet PLACE 1 TABLET UNDER THE TONGUE EVERY 5 MINUTES AS NEEDED FOR CHEST PAIN. MAY TAKE UP TO 3 DOSES (Patient not taking: No sig reported)   Omega-3 Fatty Acids (FISH OIL) 1000 MG CAPS Take by mouth.   omeprazole (PRILOSEC) 20 MG capsule Take 1 capsule by mouth daily.   ranolazine (RANEXA) 500 MG 12 hr tablet Take by mouth.   rosuvastatin (CRESTOR) 20 MG tablet Take by mouth.   temazepam (RESTORIL) 15 MG capsule Take by mouth. (Patient not taking: No sig reported)   Tiotropium Bromide-Olodaterol 2.5-2.5 MCG/ACT AERS Inhale into the lungs.   torsemide (DEMADEX) 20 MG tablet Take by mouth.   No current facility-administered medications for this visit. (Other)      REVIEW OF SYSTEMS:    ALLERGIES Allergies  Allergen  Reactions   Other Other (See Comments)    Tears skin Other reaction(s): Other (See Comments) Tears skin   Tape     Other reaction(s): Other (See Comments) Tears skin Tears skin    PAST MEDICAL HISTORY Past Medical History:  Diagnosis Date   Diabetes mellitus without complication ([place])    Hypertension    Past Surgical History:  Procedure Laterality Date   CATARACT EXTRACTION Left 2014   EYE SURGERY Left 07/22/2020   Dr. Towanda Octave, [facility]    FAMILY HISTORY History reviewed. No pertinent family history.  SOCIAL HISTORY Social History   Tobacco Use   Smoking status: Never   Smokeless tobacco: Never         OPHTHALMIC EXAM:  Base Eye Exam     Visual Acuity  (ETDRS)       Right Left   Dist cc 20/25 +2 20/32 -2   Dist ph cc  NI    Correction: Glasses         Tonometry (Tonopen, 11:15 AM)       Right Left   Pressure 13 13         Pupils       Pupils Dark Light Shape React APD   Right PERRL 5 4.5 Round Minimal None   Left PERRL 5 4.5 Round Minimal None         Visual Fields (Counting fingers)       Left Right    Full Full         Extraocular Movement       Right Left    Full, Ortho Full, Ortho         Neuro/Psych     Oriented x3: Yes   Mood/Affect: Normal         Dilation     Left eye: 1.0% Mydriacyl, 2.5% Phenylephrine @ 11:14 AM           Slit Lamp and Fundus Exam     External Exam       Right Left   External Normal Normal         Slit Lamp Exam       Right Left   Lids/Lashes Normal Normal   Conjunctiva/Sclera White and quiet White and quiet   Cornea Clear Clear   Anterior Chamber Deep and quiet Deep and quiet   Iris Round and reactive Round and reactive   Lens Centered posterior chamber intraocular lens Centered posterior chamber intraocular lens   Anterior Vitreous Normal Normal         Fundus Exam       Right Left   Posterior Vitreous  Clear vitrectomized   Disc  Normal   C/D Ratio  0.15   Macula  Microaneurysms, no macular thickening, no exudates, no clinically significant macular edema   Vessels  NPDR- Moderate   Periphery  Normal            IMAGING AND PROCEDURES  Imaging and Procedures for 11/15/21  OCT, Retina - OU - Both Eyes       Right Eye Quality was good. Scan locations included subfoveal. Central Foveal Thickness: 311. Progression has improved. Findings include abnormal foveal contour.   Left Eye Quality was good. Scan locations included subfoveal. Central Foveal Thickness: 304. Progression has improved. Findings include cystoid macular edema, abnormal foveal contour.   Notes OD with minor intraretinal fluid, status post vitrectomy membrane peel   much less thickening, much less outer retinal disturbance  as compared to epiretinal membrane present November 2021  OS with perifoveal thickening with intraretinal fluid and extension to the outer retina inferonasal to FAZ consistent with a rap like lesion when correlated with clinical findings on macular evaluation, today with much less CME and intraretinal fluid post recent Avastin.  Will need repeat injection Avastin today and examination again in 6 weeks     Intravitreal Injection, Pharmacologic Agent - OS - Left Eye       Time Out 11/15/2021. 11:34 AM. Confirmed correct patient, procedure, site, and patient consented.   Anesthesia Topical anesthesia was used. Anesthetic medications included Lidocaine 4%.   Procedure Preparation included 10% betadine to eyelids, 5% betadine to ocular surface, Ofloxacin . A 30 gauge needle was used.   Injection: 2.5 mg bevacizumab 2.5 MG/0.1ML   Route: Intravitreal, Site: Left Eye   NDC: [phone], Lot: [id number]   Post-op Post injection exam found visual acuity of at least counting fingers. The patient tolerated the procedure well. There were no complications. The patient received written and verbal post procedure care education. Post injection medications included ocuflox.              ASSESSMENT/PLAN:  Severe nonproliferative diabetic retinopathy of left eye, with macular edema, associated with type 2 diabetes mellitus (HCC) Overall severe NPDR stable  Exudative age-related macular degeneration of left eye with active choroidal neovascularization (HCC) Rap-like lesion perifoveal, improved CME post Avastin some 6 weeks previous.  Repeat injection today to maintain improved acuity     ICD-10-CM   1. Cystoid macular edema of left eye  H35.352 OCT, Retina - OU - Both Eyes    Intravitreal Injection, Pharmacologic Agent - OS - Left Eye    bevacizumab (AVASTIN) SOSY 2.5 mg    2. Severe nonproliferative diabetic retinopathy of left eye,  with macular edema, associated with type 2 diabetes mellitus ([place])  J24.2683     3. Exudative age-related macular degeneration of left eye with active choroidal neovascularization ([place])  H35.3221       1.  CNVM with CME perifoveal, rap-like lesion, OS, improved post Avastin.  Repeat injection today  2.  3.  Ophthalmic Meds Ordered this visit:  Meds ordered this encounter  Medications   bevacizumab (AVASTIN) SOSY 2.5 mg       Return in about 6 weeks (around 12/27/2021) for dilate, OS, AVASTIN OCT.  There are no Patient Instructions on file for this visit.   Explained the diagnoses, plan, and follow up with the patient and they expressed understanding.  Patient expressed understanding of the importance of proper follow up care.   Clent Demark Kash Mothershead M.D. Diseases & Surgery of the Retina and Vitreous [facility]ina [facility]abetic [place] 11/15/21     Abbreviations: M myopia (nearsighted); A astigmatism; H hyperopia (farsighted); P presbyopia; Mrx spectacle prescription;  CTL contact lenses; OD right eye; OS left eye; OU both eyes  XT exotropia; ET esotropia; PEK punctate epithelial keratitis; PEE punctate epithelial erosions; DES dry eye syndrome; MGD meibomian gland dysfunction; ATs artificial tears; PFAT's preservative free artificial tears; [place] nuclear sclerotic cataract; PSC posterior subcapsular cataract; ERM epi-retinal membrane; PVD posterior vitreous detachment; RD retinal detachment; DM diabetes mellitus; DR diabetic retinopathy; NPDR non-proliferative diabetic retinopathy; PDR proliferative diabetic retinopathy; CSME clinically significant macular edema; DME diabetic macular edema; dbh dot blot hemorrhages; CWS cotton wool spot; POAG primary open angle glaucoma; C/D cup-to-disc ratio; HVF humphrey visual field; GVF goldmann visual field; OCT optical coherence tomography; IOP intraocular pressure; BRVO Branch  retinal vein occlusion; CRVO central retinal vein occlusion; CRAO central retinal  artery occlusion; BRAO branch retinal artery occlusion; RT retinal tear; SB scleral buckle; PPV pars plana vitrectomy; VH Vitreous hemorrhage; PRP panretinal laser photocoagulation; IVK intravitreal kenalog; VMT vitreomacular traction; MH Macular hole;  NVD neovascularization of the disc; NVE neovascularization elsewhere; AREDS age related eye disease study; ARMD age related macular degeneration; POAG primary open angle glaucoma; EBMD epithelial/anterior basement membrane dystrophy; ACIOL anterior chamber intraocular lens; IOL intraocular lens; PCIOL posterior chamber intraocular lens; Phaco/IOL phacoemulsification with intraocular lens placement; [place] photorefractive keratectomy; LASIK laser assisted in situ keratomileusis; HTN hypertension; DM diabetes mellitus; COPD chronic obstructive pulmonary disease

## 2021-11-15 NOTE — Assessment & Plan Note (Signed)
Rap-like lesion perifoveal, improved CME post Avastin some 6 weeks previous.  Repeat injection today to maintain improved acuity

## 2021-11-17 ENCOUNTER — Inpatient Hospital Stay: Payer: Medicare PPO | Attending: Oncology

## 2021-11-17 ENCOUNTER — Inpatient Hospital Stay: Payer: Medicare PPO

## 2021-11-17 ENCOUNTER — Other Ambulatory Visit: Payer: Self-pay

## 2021-11-17 VITALS — BP 108/61 | HR 62

## 2021-11-17 DIAGNOSIS — J189 Pneumonia, unspecified organism: Secondary | ICD-10-CM

## 2021-11-17 DIAGNOSIS — D631 Anemia in chronic kidney disease: Secondary | ICD-10-CM | POA: Insufficient documentation

## 2021-11-17 DIAGNOSIS — N184 Chronic kidney disease, stage 4 (severe): Secondary | ICD-10-CM | POA: Insufficient documentation

## 2021-11-17 HISTORY — DX: Pneumonia, unspecified organism: J18.9

## 2021-11-17 LAB — HEMOGLOBIN AND HEMATOCRIT, BLOOD
HCT: 29.8 % — ABNORMAL LOW (ref 36.0–46.0)
Hemoglobin: 9.4 g/dL — ABNORMAL LOW (ref 12.0–15.0)

## 2021-11-17 MED ORDER — EPOETIN ALFA-EPBX 10000 UNIT/ML IJ SOLN
20000.0000 [IU] | Freq: Once | INTRAMUSCULAR | Status: AC
  Administered 2021-11-17: 20000 [IU] via SUBCUTANEOUS
  Filled 2021-11-17: qty 2

## 2021-12-01 ENCOUNTER — Inpatient Hospital Stay: Payer: Medicare PPO

## 2021-12-01 ENCOUNTER — Other Ambulatory Visit: Payer: Self-pay

## 2021-12-01 ENCOUNTER — Inpatient Hospital Stay: Payer: Medicare PPO | Attending: Oncology

## 2021-12-01 VITALS — BP 131/60 | HR 45

## 2021-12-01 DIAGNOSIS — N184 Chronic kidney disease, stage 4 (severe): Secondary | ICD-10-CM | POA: Insufficient documentation

## 2021-12-01 DIAGNOSIS — D631 Anemia in chronic kidney disease: Secondary | ICD-10-CM | POA: Insufficient documentation

## 2021-12-01 DIAGNOSIS — N189 Chronic kidney disease, unspecified: Secondary | ICD-10-CM

## 2021-12-01 LAB — HEMOGLOBIN AND HEMATOCRIT, BLOOD
HCT: 30.1 % — ABNORMAL LOW (ref 36.0–46.0)
Hemoglobin: 9.5 g/dL — ABNORMAL LOW (ref 12.0–15.0)

## 2021-12-01 MED ORDER — EPOETIN ALFA-EPBX 10000 UNIT/ML IJ SOLN
20000.0000 [IU] | Freq: Once | INTRAMUSCULAR | Status: AC
  Administered 2021-12-01: 20000 [IU] via SUBCUTANEOUS
  Filled 2021-12-01: qty 2

## 2021-12-15 ENCOUNTER — Inpatient Hospital Stay: Payer: Medicare PPO

## 2021-12-15 ENCOUNTER — Inpatient Hospital Stay: Payer: Medicare PPO | Attending: Oncology

## 2021-12-15 ENCOUNTER — Other Ambulatory Visit: Payer: Self-pay

## 2021-12-15 VITALS — BP 150/80 | HR 56

## 2021-12-15 DIAGNOSIS — N189 Chronic kidney disease, unspecified: Secondary | ICD-10-CM

## 2021-12-15 DIAGNOSIS — D631 Anemia in chronic kidney disease: Secondary | ICD-10-CM | POA: Insufficient documentation

## 2021-12-15 DIAGNOSIS — N184 Chronic kidney disease, stage 4 (severe): Secondary | ICD-10-CM | POA: Insufficient documentation

## 2021-12-15 LAB — HEMOGLOBIN AND HEMATOCRIT, BLOOD
HCT: 30.1 % — ABNORMAL LOW (ref 36.0–46.0)
Hemoglobin: 9.4 g/dL — ABNORMAL LOW (ref 12.0–15.0)

## 2021-12-15 MED ORDER — EPOETIN ALFA-EPBX 10000 UNIT/ML IJ SOLN
20000.0000 [IU] | Freq: Once | INTRAMUSCULAR | Status: AC
  Administered 2021-12-15: 14:00:00 20000 [IU] via SUBCUTANEOUS
  Filled 2021-12-15: qty 2

## 2021-12-18 DIAGNOSIS — Z992 Dependence on renal dialysis: Secondary | ICD-10-CM

## 2021-12-18 HISTORY — DX: Dependence on renal dialysis: Z99.2

## 2021-12-22 ENCOUNTER — Other Ambulatory Visit: Payer: Self-pay | Admitting: *Deleted

## 2021-12-22 DIAGNOSIS — D509 Iron deficiency anemia, unspecified: Secondary | ICD-10-CM

## 2021-12-27 ENCOUNTER — Other Ambulatory Visit: Payer: Self-pay

## 2021-12-27 ENCOUNTER — Ambulatory Visit (INDEPENDENT_AMBULATORY_CARE_PROVIDER_SITE_OTHER): Payer: Medicare PPO | Admitting: Ophthalmology

## 2021-12-27 ENCOUNTER — Encounter (INDEPENDENT_AMBULATORY_CARE_PROVIDER_SITE_OTHER): Payer: Self-pay | Admitting: Ophthalmology

## 2021-12-27 DIAGNOSIS — H353221 Exudative age-related macular degeneration, left eye, with active choroidal neovascularization: Secondary | ICD-10-CM

## 2021-12-27 DIAGNOSIS — H35352 Cystoid macular degeneration, left eye: Secondary | ICD-10-CM

## 2021-12-27 DIAGNOSIS — E113213 Type 2 diabetes mellitus with mild nonproliferative diabetic retinopathy with macular edema, bilateral: Secondary | ICD-10-CM | POA: Diagnosis not present

## 2021-12-27 DIAGNOSIS — E113412 Type 2 diabetes mellitus with severe nonproliferative diabetic retinopathy with macular edema, left eye: Secondary | ICD-10-CM

## 2021-12-27 MED ORDER — BEVACIZUMAB 2.5 MG/0.1ML IZ SOSY
2.5000 mg | PREFILLED_SYRINGE | INTRAVITREAL | Status: AC | PRN
  Administered 2021-12-27: 2.5 mg via INTRAVITREAL

## 2021-12-27 NOTE — Assessment & Plan Note (Signed)
No progression of diabetic retinopathy OD

## 2021-12-27 NOTE — Progress Notes (Signed)
12/27/2021     CHIEF COMPLAINT Patient presents for  Chief Complaint  Patient presents with   Retina Follow Up    Follow-up OS for wet AMD onset October 2022.  Hospital injection today of intravitreal Avastin at 6-week interval HISTORY OF PRESENT ILLNESS: Alexandria Moody is a 76 y.o. female who presents to the clinic today for:   HPI     Retina Follow Up           Diagnosis: Other   Laterality: left eye   Onset: 6 weeks ago   Severity: mild   Duration: 6 weeks   Course: gradually worsening         Comments   6 week for dilate OS, Avastin OS and OCT Pt states VA OU stable since last visit. Pt denies FOL, floaters, or ocular pain OU.    Pt states, "there are a lot of things that I cannot see. I am not sure if my vision is getting worse, I just know that I cannot see very well. I am having trouble reading street signs and walking my dog. I am also having trouble seeing black and blue colors."          Last edited by Kendra Opitz, COA on 12/27/2021 10:26 AM.      Referring physician: No referring provider defined for this encounter.  HISTORICAL INFORMATION:   Selected notes from the MEDICAL RECORD NUMBER       CURRENT MEDICATIONS: No current outpatient medications on file. (Ophthalmic Drugs)   No current facility-administered medications for this visit. (Ophthalmic Drugs)   Current Outpatient Medications (Other)  Medication Sig   acetaminophen (TYLENOL) 325 MG tablet Take by mouth.   albuterol (VENTOLIN HFA) 108 (90 Base) MCG/ACT inhaler Inhale into the lungs. (Patient not taking: No sig reported)   amLODipine (NORVASC) 5 MG tablet Take 1 tablet by mouth 2 (two) times daily.   aspirin 81 MG EC tablet Take by mouth.   calcitRIOL (ROCALTROL) 0.25 MCG capsule Take by mouth.   Cholecalciferol 125 MCG (5000 UT) capsule Take by mouth.   citalopram (CELEXA) 20 MG tablet Take by mouth.   COVID-19 mRNA bivalent vaccine, Moderna, (MODERNA COVID-19 BIVAL  BOOSTER) 50 MCG/0.5ML injection Inject into the muscle.   folic acid (V-R FOLIC ACID) 676 MCG tablet Take 1 tablet (400 mcg total) by mouth daily.   gabapentin (NEURONTIN) 100 MG capsule Take 100 mg by mouth as directed. 1-2 capsules (100-200) total by mouth every evening as directed for insomnia   hydrALAZINE (APRESOLINE) 50 MG tablet Take by mouth in the morning and at bedtime.   insulin glargine, 1 Unit Dial, (TOUJEO SOLOSTAR) 300 UNIT/ML Solostar Pen Inject 32 Units into the skin daily.   Insulin Pen Needle (BD PEN NEEDLE NANO 2ND GEN) 32G X 4 MM MISC daily.   ipratropium (ATROVENT) 0.06 % nasal spray Place into the nose. (Patient not taking: No sig reported)   isosorbide mononitrate (IMDUR) 120 MG 24 hr tablet Take by mouth.   levothyroxine (SYNTHROID) 75 MCG tablet Take by mouth.   linagliptin (TRADJENTA) 5 MG TABS tablet 1 (one) time each day   losartan (COZAAR) 25 MG tablet Take by mouth.   nitroGLYCERIN (NITROSTAT) 0.4 MG SL tablet PLACE 1 TABLET UNDER THE TONGUE EVERY 5 MINUTES AS NEEDED FOR CHEST PAIN. MAY TAKE UP TO 3 DOSES (Patient not taking: No sig reported)   Omega-3 Fatty Acids (FISH OIL) 1000 MG CAPS Take by mouth.  omeprazole (PRILOSEC) 20 MG capsule Take 1 capsule by mouth daily.   ranolazine (RANEXA) 500 MG 12 hr tablet Take by mouth.   rosuvastatin (CRESTOR) 20 MG tablet Take by mouth.   temazepam (RESTORIL) 15 MG capsule Take by mouth. (Patient not taking: No sig reported)   Tiotropium Bromide-Olodaterol 2.5-2.5 MCG/ACT AERS Inhale into the lungs.   torsemide (DEMADEX) 20 MG tablet Take by mouth.   No current facility-administered medications for this visit. (Other)      REVIEW OF SYSTEMS:    ALLERGIES Allergies  Allergen Reactions   Other Other (See Comments)    Tears skin Other reaction(s): Other (See Comments) Tears skin   Tape     Other reaction(s): Other (See Comments) Tears skin Tears skin    PAST MEDICAL HISTORY Past Medical History:   Diagnosis Date   Diabetes mellitus without complication ([place])    Hypertension    Past Surgical History:  Procedure Laterality Date   CATARACT EXTRACTION Left 2014   EYE SURGERY Left 07/22/2020   Dr. Towanda Octave, [facility]    FAMILY HISTORY History reviewed. No pertinent family history.  SOCIAL HISTORY Social History   Tobacco Use   Smoking status: Never   Smokeless tobacco: Never         OPHTHALMIC EXAM:  Base Eye Exam     Visual Acuity (ETDRS)       Right Left   Dist cc 20/25 20/50 -1   Dist ph cc  20/30 -2         Tonometry (Tonopen, 10:30 AM)       Right Left   Pressure 08 08         Pupils       Pupils Dark Light Shape React APD   Right PERRL 5 4 Round Brisk None   Left PERRL 5 4 Round Brisk None         Visual Fields (Counting fingers)       Left Right    Full Full         Extraocular Movement       Right Left    Full, Ortho Full, Ortho         Neuro/Psych     Oriented x3: Yes   Mood/Affect: Normal         Dilation     Left eye: 1.0% Mydriacyl, 2.5% Phenylephrine @ 10:30 AM           Slit Lamp and Fundus Exam     External Exam       Right Left   External Normal Normal         Slit Lamp Exam       Right Left   Lids/Lashes Normal Normal   Conjunctiva/Sclera White and quiet White and quiet   Cornea Clear Clear   Anterior Chamber Deep and quiet Deep and quiet   Iris Round and reactive Round and reactive   Lens Centered posterior chamber intraocular lens Centered posterior chamber intraocular lens   Anterior Vitreous Normal Normal         Fundus Exam       Right Left   Posterior Vitreous  Clear vitrectomized   Disc  Normal   C/D Ratio  0.15   Macula  Microaneurysms, no macular thickening, no exudates, no clinically significant macular edema   Vessels  NPDR- Moderate   Periphery  Normal            IMAGING AND PROCEDURES  Imaging and Procedures for 12/27/21  OCT, Retina - OU - Both  Eyes       Right Eye Quality was good. Scan locations included subfoveal. Central Foveal Thickness: 314. Progression has improved. Findings include abnormal foveal contour.   Left Eye Quality was good. Scan locations included subfoveal. Central Foveal Thickness: 317. Progression has improved. Findings include cystoid macular edema, abnormal foveal contour.   Notes OD with minor intraretinal fluid, status post vitrectomy membrane peel  much less thickening, much less outer retinal disturbance as compared to epiretinal membrane present November 2021  OS with perifoveal thickening with intraretinal fluid and extension to the outer retina inferonasal to FAZ consistent with a rap like lesion when correlated with clinical findings on macular evaluation, today with much less CME and intraretinal fluid post recent Avastin.  Vastly improved as compared to onset October 2022 will need repeat injection Avastin today and examination again in 7 weeks     Intravitreal Injection, Pharmacologic Agent - OS - Left Eye       Time Out 12/27/2021. 11:06 AM. Confirmed correct patient, procedure, site, and patient consented.   Anesthesia Topical anesthesia was used. Anesthetic medications included Lidocaine 4%.   Procedure Preparation included 10% betadine to eyelids, 5% betadine to ocular surface, Ofloxacin . A 30 gauge needle was used.   Injection: 2.5 mg bevacizumab 2.5 MG/0.1ML   Route: Intravitreal, Site: Left Eye   NDC: [phone], Lot: [id number]   Post-op Post injection exam found visual acuity of at least counting fingers. The patient tolerated the procedure well. There were no complications. The patient received written and verbal post procedure care education. Post injection medications included ocuflox.              ASSESSMENT/PLAN:  Exudative age-related macular degeneration of left eye with active choroidal neovascularization ([place]) Much improved macular findings as compared to  onset of treatment October 2022.  Stable acuity.  Repeat injection today and extend interval examination next to 7 weeks  Both eyes affected by mild nonproliferative diabetic retinopathy with macular edema, associated with type 2 diabetes mellitus ([place]) No progression of diabetic retinopathy OD     ICD-10-CM   1. Severe nonproliferative diabetic retinopathy of left eye, with macular edema, associated with type 2 diabetes mellitus (HCC)  H63.1497 OCT, Retina - OU - Both Eyes    Intravitreal Injection, Pharmacologic Agent - OS - Left Eye    bevacizumab (AVASTIN) SOSY 2.5 mg    2. Cystoid macular edema of left eye  H35.352     3. Exudative age-related macular degeneration of left eye with active choroidal neovascularization ([place])  H35.3221     4. Both eyes affected by mild nonproliferative diabetic retinopathy with macular edema, associated with type 2 diabetes mellitus ([place])  W26.3785       1.  OS vastly improved macular and clinical findings OS and by OCT post injection Avastin currently at 6-week interval.  Repeat injection today and extend interval examination next in 7 weeks  2.  3.  Ophthalmic Meds Ordered this visit:  Meds ordered this encounter  Medications   bevacizumab (AVASTIN) SOSY 2.5 mg       Return in about 7 weeks (around 02/14/2022) for dilate, OS, AVASTIN OCT.  There are no Patient Instructions on file for this visit.   Explained the diagnoses, plan, and follow up with the patient and they expressed understanding.  Patient expressed understanding of the importance of proper follow up care.   Clent Demark Lige Lakeman  M.D. Diseases & Surgery of the Retina and [facility] 12/27/21     Abbreviations: M myopia (nearsighted); A astigmatism; H hyperopia (farsighted); P presbyopia; Mrx spectacle prescription;  CTL contact lenses; OD right eye; OS left eye; OU both eyes  XT exotropia; ET esotropia; PEK punctate epithelial keratitis; PEE punctate  epithelial erosions; DES dry eye syndrome; MGD meibomian gland dysfunction; ATs artificial tears; PFAT's preservative free artificial tears; [place] nuclear sclerotic cataract; PSC posterior subcapsular cataract; ERM epi-retinal membrane; PVD posterior vitreous detachment; RD retinal detachment; DM diabetes mellitus; DR diabetic retinopathy; NPDR non-proliferative diabetic retinopathy; PDR proliferative diabetic retinopathy; CSME clinically significant macular edema; DME diabetic macular edema; dbh dot blot hemorrhages; CWS cotton wool spot; POAG primary open angle glaucoma; C/D cup-to-disc ratio; HVF humphrey visual field; GVF goldmann visual field; OCT optical coherence tomography; IOP intraocular pressure; BRVO Branch retinal vein occlusion; CRVO central retinal vein occlusion; CRAO central retinal artery occlusion; BRAO branch retinal artery occlusion; RT retinal tear; SB scleral buckle; PPV pars plana vitrectomy; VH Vitreous hemorrhage; PRP panretinal laser photocoagulation; IVK intravitreal kenalog; VMT vitreomacular traction; MH Macular hole;  NVD neovascularization of the disc; NVE neovascularization elsewhere; AREDS age related eye disease study; ARMD age related macular degeneration; POAG primary open angle glaucoma; EBMD epithelial/anterior basement membrane dystrophy; ACIOL anterior chamber intraocular lens; IOL intraocular lens; PCIOL posterior chamber intraocular lens; Phaco/IOL phacoemulsification with intraocular lens placement; [place] photorefractive keratectomy; LASIK laser assisted in situ keratomileusis; HTN hypertension; DM diabetes mellitus; COPD chronic obstructive pulmonary disease

## 2021-12-27 NOTE — Assessment & Plan Note (Signed)
Much improved macular findings as compared to onset of treatment October 2022.  Stable acuity.  Repeat injection today and extend interval examination next to 7 weeks

## 2021-12-29 ENCOUNTER — Inpatient Hospital Stay: Payer: Medicare PPO

## 2021-12-29 ENCOUNTER — Other Ambulatory Visit: Payer: Self-pay

## 2021-12-29 ENCOUNTER — Inpatient Hospital Stay: Payer: Medicare PPO | Attending: Oncology

## 2021-12-29 VITALS — BP 127/65 | HR 59

## 2021-12-29 DIAGNOSIS — N184 Chronic kidney disease, stage 4 (severe): Secondary | ICD-10-CM | POA: Diagnosis present

## 2021-12-29 DIAGNOSIS — N189 Chronic kidney disease, unspecified: Secondary | ICD-10-CM

## 2021-12-29 DIAGNOSIS — D631 Anemia in chronic kidney disease: Secondary | ICD-10-CM | POA: Insufficient documentation

## 2021-12-29 LAB — HEMOGLOBIN AND HEMATOCRIT, BLOOD
HCT: 30.5 % — ABNORMAL LOW (ref 36.0–46.0)
Hemoglobin: 9.8 g/dL — ABNORMAL LOW (ref 12.0–15.0)

## 2021-12-29 MED ORDER — EPOETIN ALFA-EPBX 10000 UNIT/ML IJ SOLN
20000.0000 [IU] | Freq: Once | INTRAMUSCULAR | Status: AC
  Administered 2021-12-29: 20000 [IU] via SUBCUTANEOUS
  Filled 2021-12-29: qty 2

## 2022-01-09 ENCOUNTER — Telehealth: Payer: Self-pay | Admitting: Oncology

## 2022-01-09 NOTE — Telephone Encounter (Signed)
Daughter called to confirm pt's appt. I tried calling 3 times and pt did't pick up. They are confused on the dates. Call back at [phone]

## 2022-01-10 ENCOUNTER — Inpatient Hospital Stay: Payer: Medicare PPO | Attending: Oncology

## 2022-01-10 ENCOUNTER — Other Ambulatory Visit: Payer: Self-pay

## 2022-01-10 DIAGNOSIS — E1122 Type 2 diabetes mellitus with diabetic chronic kidney disease: Secondary | ICD-10-CM | POA: Insufficient documentation

## 2022-01-10 DIAGNOSIS — I120 Hypertensive chronic kidney disease with stage 5 chronic kidney disease or end stage renal disease: Secondary | ICD-10-CM | POA: Diagnosis not present

## 2022-01-10 DIAGNOSIS — I7782 Antineutrophilic cytoplasmic antibody (ANCA) vasculitis: Secondary | ICD-10-CM | POA: Insufficient documentation

## 2022-01-10 DIAGNOSIS — D631 Anemia in chronic kidney disease: Secondary | ICD-10-CM | POA: Diagnosis present

## 2022-01-10 DIAGNOSIS — N184 Chronic kidney disease, stage 4 (severe): Secondary | ICD-10-CM | POA: Diagnosis present

## 2022-01-10 DIAGNOSIS — D509 Iron deficiency anemia, unspecified: Secondary | ICD-10-CM

## 2022-01-10 LAB — CBC WITH DIFFERENTIAL/PLATELET
Abs Immature Granulocytes: 0.06 10*3/uL (ref 0.00–0.07)
Basophils Absolute: 0 10*3/uL (ref 0.0–0.1)
Basophils Relative: 1 %
Eosinophils Absolute: 0.3 10*3/uL (ref 0.0–0.5)
Eosinophils Relative: 4 %
HCT: 29.6 % — ABNORMAL LOW (ref 36.0–46.0)
Hemoglobin: 9.3 g/dL — ABNORMAL LOW (ref 12.0–15.0)
Immature Granulocytes: 1 %
Lymphocytes Relative: 13 %
Lymphs Abs: 0.8 10*3/uL (ref 0.7–4.0)
MCH: 31.1 pg (ref 26.0–34.0)
MCHC: 31.4 g/dL (ref 30.0–36.0)
MCV: 99 fL (ref 80.0–100.0)
Monocytes Absolute: 0.4 10*3/uL (ref 0.1–1.0)
Monocytes Relative: 6 %
Neutro Abs: 4.7 10*3/uL (ref 1.7–7.7)
Neutrophils Relative %: 75 %
Platelets: 255 10*3/uL (ref 150–400)
RBC: 2.99 MIL/uL — ABNORMAL LOW (ref 3.87–5.11)
RDW: 14.7 % (ref 11.5–15.5)
WBC: 6.2 10*3/uL (ref 4.0–10.5)
nRBC: 0 % (ref 0.0–0.2)

## 2022-01-10 LAB — IRON AND TIBC
Iron: 39 ug/dL (ref 28–170)
Saturation Ratios: 19 % (ref 10.4–31.8)
TIBC: 207 ug/dL — ABNORMAL LOW (ref 250–450)
UIBC: 168 ug/dL

## 2022-01-10 LAB — FERRITIN: Ferritin: 158 ng/mL (ref 11–307)

## 2022-01-11 ENCOUNTER — Encounter: Payer: Self-pay | Admitting: Surgery

## 2022-01-11 ENCOUNTER — Telehealth: Payer: Self-pay

## 2022-01-11 ENCOUNTER — Ambulatory Visit: Payer: Medicare PPO | Admitting: Surgery

## 2022-01-11 VITALS — BP 138/71 | HR 59 | Temp 97.7°F | Ht 63.0 in | Wt 156.8 lb

## 2022-01-11 DIAGNOSIS — N186 End stage renal disease: Secondary | ICD-10-CM | POA: Diagnosis not present

## 2022-01-11 DIAGNOSIS — Z992 Dependence on renal dialysis: Secondary | ICD-10-CM | POA: Diagnosis not present

## 2022-01-11 NOTE — Patient Instructions (Addendum)
Our surgery scheduler will call you within 24-48 hours to schedule your surgery. Please have the [facility] surgery sheet available when speaking with her.  Cardiac Clearance faxed to [facility]. Please call his office to se if he need you to schedule an office visit.

## 2022-01-11 NOTE — Telephone Encounter (Signed)
Cardiac Clearance faxed to [facility] today.

## 2022-01-12 ENCOUNTER — Inpatient Hospital Stay: Payer: Medicare PPO | Admitting: Oncology

## 2022-01-12 ENCOUNTER — Other Ambulatory Visit: Payer: Self-pay

## 2022-01-12 ENCOUNTER — Inpatient Hospital Stay: Payer: Medicare PPO

## 2022-01-12 ENCOUNTER — Encounter: Payer: Self-pay | Admitting: Oncology

## 2022-01-12 VITALS — BP 164/60 | HR 59 | Temp 96.8°F | Resp 18 | Wt 157.0 lb

## 2022-01-12 DIAGNOSIS — I7782 Antineutrophilic cytoplasmic antibody (ANCA) vasculitis: Secondary | ICD-10-CM

## 2022-01-12 DIAGNOSIS — D631 Anemia in chronic kidney disease: Secondary | ICD-10-CM

## 2022-01-12 DIAGNOSIS — N184 Chronic kidney disease, stage 4 (severe): Secondary | ICD-10-CM

## 2022-01-12 DIAGNOSIS — N189 Chronic kidney disease, unspecified: Secondary | ICD-10-CM

## 2022-01-12 MED ORDER — EPOETIN ALFA-EPBX 10000 UNIT/ML IJ SOLN
20000.0000 [IU] | Freq: Once | INTRAMUSCULAR | Status: AC
  Administered 2022-01-12: 20000 [IU] via SUBCUTANEOUS
  Filled 2022-01-12: qty 2

## 2022-01-12 NOTE — Progress Notes (Signed)
FOLLOW UP NOTE  Patient Care Team: Bubba Camp, FNP as PCP - General (Family Medicine)  CHIEF COMPLAINTS/PURPOSE OF CONSULTATION: Anemia  HISTORY OF PRESENTING ILLNESS:  Alexandria Moody 76 y.o. female presents for follow-up  PERTINENT HEMATOLOGY HISTORY Patient previously was seen by nurse practitioner Faythe Casa, patient switched care to me on 05/05/21 Extensive medical record review was performed by me  PMH  significant for chronic kidney disease stage IV, prior history of ANCA vasculitis, diabetes type 2, hypertension, PVD, hypothyroidism, GERD chronically secondary to chronic kidney disease.  04/05/2021 She evaluated by Dr. Posey Pronto on  for joint pain and found to have a large drop in her hemoglobin.  She had been seen 1 day prior by Dr. Holley Raring with a reported hemoglobin of 10.3.  She just moved to the area from [facility].  She has previously received blood transfusions, iron and Epogen.    Patient was seen by NP Faythe Casa on 04/12/2021.  Anemia was felt and the patient was started on replacement therapy.  04/20/2021 Retacrit 10,000 units  04/28/2021 iron panel ferritin 305, iron saturation 24.  Patient received 1 dose of IV Venofer treatment. 05/03/2021, IV Venofer x1 05/05/2021, hemoglobin was 7.4, Retacrit 10,000 units.  05/11/2021, hemoglobin has improved to 8.3.  Hold off blood transfusion and Retacrit today.  Patient reports that appetite is fair.  Weight has been stable. She is not able to tolerate oral iron supplementation due to GI toxicities.  She receives monthly vitamin B12 injections through her PCP  Reports history of EGD/colonoscopy many years ago.  No recent EGD/colonoscopy.  INTERVAL HISTORY Alexandria Moody is a 76 y.o. female who has above history reviewed by me today presents for follow up visit for management of anemia Patient has been on H&H every 2 weeks +/- Retacrit 20,000 units if hemoglobin is less than 10 Patient recently was seen by  rheumatology for vasculitis treatment.  Patient stopped Plaquenil for 2 weeks and restarted recently.   Patient is going to start on hemodialysis.  She has scheduled surgery for dialysis port assessment and apparently started on dialysis 1 month after catheter is placed. + Shortness of breath with exertion.  MEDICAL HISTORY:  Past Medical History:  Diagnosis Date   Diabetes mellitus without complication ([place])    Hypertension     SURGICAL HISTORY: Past Surgical History:  Procedure Laterality Date   CATARACT EXTRACTION Left 2014   EYE SURGERY Left 07/22/2020   Dr. Towanda Octave, Vit and [facility]el    SOCIAL HISTORY: Social History   Socioeconomic History   Marital status: Widowed    Spouse name: Not on file   Number of children: Not on file   Years of education: Not on file   Highest education level: Not on file  Occupational History   Not on file  Tobacco Use   Smoking status: Never   Smokeless tobacco: Never  Substance and Sexual Activity   Alcohol use: Not on file   Drug use: Not on file   Sexual activity: Not on file  Other Topics Concern   Not on file  Social History Narrative   Not on file   Social Determinants of Health   Financial Resource Strain: Not on file  Food Insecurity: Not on file  Transportation Needs: Not on file  Physical Activity: Not on file  Stress: Not on file  Social Connections: Not on file  Intimate Partner Violence: Not on file    FAMILY HISTORY: History reviewed. No pertinent family  history.  ALLERGIES:  is allergic to tape.  MEDICATIONS:  Current Outpatient Medications  Medication Sig Dispense Refill   acetaminophen (TYLENOL) 325 MG tablet Take 650 mg by mouth every 6 (six) hours as needed for moderate pain or headache.     albuterol (VENTOLIN HFA) 108 (90 Base) MCG/ACT inhaler Inhale 2 puffs into the lungs every 6 (six) hours as needed for wheezing or shortness of breath.     amLODipine (NORVASC) 5 MG tablet Take 5 mg by mouth 2 (two)  times daily.     aspirin 81 MG EC tablet Take 81 mg by mouth daily.     calcitRIOL (ROCALTROL) 0.25 MCG capsule Take 0.25 mcg by mouth daily.     Cholecalciferol 125 MCG (5000 UT) capsule Take 5,000 Units by mouth daily.     citalopram (CELEXA) 20 MG tablet Take 20 mg by mouth daily.     fluocinonide (LIDEX) 0.05 % external solution Apply 1 application topically 2 (two) times daily as needed (scalp irritation).     gabapentin (NEURONTIN) 100 MG capsule Take 200 mg by mouth at bedtime as needed (pain).     hydrALAZINE (APRESOLINE) 50 MG tablet Take 50 mg by mouth in the morning and at bedtime.     hydroxychloroquine (PLAQUENIL) 200 MG tablet Take 200 mg by mouth daily.     insulin glargine, 1 Unit Dial, (TOUJEO SOLOSTAR) 300 UNIT/ML Solostar Pen Inject 32 Units into the skin daily.     Insulin Pen Needle (BD PEN NEEDLE NANO 2ND GEN) 32G X 4 MM MISC daily.     ipratropium (ATROVENT) 0.06 % nasal spray Place 2 sprays into both nostrils 3 (three) times daily as needed for rhinitis.     isosorbide mononitrate (IMDUR) 120 MG 24 hr tablet Take 120 mg by mouth at bedtime.     levothyroxine (SYNTHROID) 75 MCG tablet Take 75 mcg by mouth daily before breakfast.     linagliptin (TRADJENTA) 5 MG TABS tablet Take 5 mg by mouth daily.     losartan (COZAAR) 25 MG tablet Take 25 mg by mouth daily.     nitroGLYCERIN (NITROSTAT) 0.4 MG SL tablet Place 0.4 mg under the tongue every 5 (five) minutes as needed for chest pain.     Omega-3 Fatty Acids (FISH OIL) 1000 MG CAPS Take 1,000 mg by mouth in the morning and at bedtime.     omeprazole (PRILOSEC) 20 MG capsule Take 20 mg by mouth daily.     patiromer (VELTASSA) 8.4 g packet Take 8.4 g by mouth daily.     ranolazine (RANEXA) 500 MG 12 hr tablet Take 500 mg by mouth 2 (two) times daily.     rosuvastatin (CRESTOR) 20 MG tablet Take 20 mg by mouth daily.     torsemide (DEMADEX) 20 MG tablet Take 20 mg by mouth daily.     COVID-19 mRNA bivalent vaccine, Moderna,  (MODERNA COVID-19 BIVAL BOOSTER) 50 MCG/0.5ML injection Inject into the muscle. (Patient not taking: Reported on 12/26/3233) 0.5 mL 0   folic acid (V-R FOLIC ACID) 573 MCG tablet Take 1 tablet (400 mcg total) by mouth daily. (Patient not taking: Reported on 01/12/2022) 30 tablet 0   No current facility-administered medications for this visit.    REVIEW OF SYSTEMS:   Review of Systems  Constitutional:  Negative for chills, fever, malaise/fatigue and weight loss.  HENT:  Negative for congestion, ear pain, nosebleeds and tinnitus.   Eyes: Negative.  Negative for blurred vision and double vision.  Respiratory:  Negative for cough, sputum production and shortness of breath.   Cardiovascular: Negative.  Negative for chest pain, palpitations and leg swelling.  Gastrointestinal: Negative.  Negative for abdominal pain, constipation, diarrhea, nausea and vomiting.  Genitourinary:  Negative for dysuria, frequency and urgency.  Musculoskeletal:  Negative for back pain and falls.  Skin: Negative.  Negative for rash.  Neurological:  Negative for dizziness, weakness and headaches.  Endo/Heme/Allergies: Negative.  Does not bruise/bleed easily.  Psychiatric/Behavioral:  Negative for depression. The patient is not nervous/anxious and does not have insomnia.    PHYSICAL EXAMINATION: ECOG PERFORMANCE STATUS: 1 - Symptomatic but completely ambulatory  Vitals:   01/12/22 1358  BP: (!) 164/60  Pulse: (!) 59  Resp: 18  Temp: (!) 96.8 F (36 C)  SpO2: 99%   Filed Weights   01/12/22 1358  Weight: 157 lb (71.2 kg)    Physical Exam Constitutional:      Comments: Frail appearance, thin built, ambulates independently  HENT:     Head: Normocephalic and atraumatic.  Eyes:     Pupils: Pupils are equal, round, and reactive to light.  Cardiovascular:     Rate and Rhythm: Normal rate and regular rhythm.     Heart sounds: Murmur heard.  Pulmonary:     Effort: Pulmonary effort is normal.     Breath sounds:  Wheezing present.  Abdominal:     General: Bowel sounds are normal. There is no distension.     Palpations: Abdomen is soft.     Tenderness: There is no abdominal tenderness.  Musculoskeletal:        General: Normal range of motion.     Cervical back: Normal range of motion.  Skin:    General: Skin is warm and dry.     Findings: No rash.  Neurological:     Mental Status: She is alert and oriented to person, place, and time.  Psychiatric:        Mood and Affect: Mood normal.    LABORATORY DATA:  I have reviewed the data as listed CBC    Component Value Date/Time   WBC 6.2 01/10/2022 1318   RBC 2.99 (L) 01/10/2022 1318   HGB 9.3 (L) 01/10/2022 1318   HCT 29.6 (L) 01/10/2022 1318   PLT 255 01/10/2022 1318   MCV 99.0 01/10/2022 1318   MCH 31.1 01/10/2022 1318   MCHC 31.4 01/10/2022 1318   RDW 14.7 01/10/2022 1318   LYMPHSABS 0.8 01/10/2022 1318   MONOABS 0.4 01/10/2022 1318   EOSABS 0.3 01/10/2022 1318   BASOSABS 0.0 01/10/2022 1318   CMP Latest Ref Rng & Units 11/14/2021 05/05/2021 05/03/2021  Glucose 70 - 99 mg/dL 208(H) 146(H) 138(H)  BUN 8 - 23 mg/dL 49(H) 57(H) 49(H)  Creatinine 0.44 - 1.00 mg/dL 2.44(H) 2.99(H) 3.07(H)  Sodium 135 - 145 mmol/L 134(L) 135 137  Potassium 3.5 - 5.1 mmol/L 4.6 4.5 4.8  Chloride 98 - 111 mmol/L 110 108 108  CO2 22 - 32 mmol/L 16(L) 20(L) 21(L)  Calcium 8.9 - 10.3 mg/dL 8.1(L) 8.3(L) 8.4(L)  Total Protein 6.5 - 8.1 g/dL - 6.7 6.8  Total Bilirubin 0.3 - 1.2 mg/dL - 0.7 0.7  Alkaline Phos 38 - 126 U/L - 108 99  AST 15 - 41 U/L - 20 21  ALT 0 - 44 U/L - 12 13    RADIOGRAPHIC STUDIES: I have personally reviewed the radiological images as listed and agreed with the findings in the report. DG Chest 2 View  Result Date: 11/14/2021 CLINICAL DATA:  Near syncope. EXAM: CHEST - 2 VIEW COMPARISON:  None. FINDINGS: Multiple sternal wires and vascular clips are seen. Mild to moderate severity diffusely increased interstitial markings are noted.  Very mild atelectasis is seen within the lateral aspect of the bilateral lung bases. There is no evidence of a pleural effusion or pneumothorax. The heart size and mediastinal contours are within normal limits. Moderate to marked severity calcification of the aortic arch is noted. The visualized skeletal structures are unremarkable. IMPRESSION: 1. Evidence of prior median sternotomy/CABG. 2. Additional findings suggestive of mild to moderate severity diffuse interstitial edema. Underlying chronic interstitial lung disease cannot be excluded. Electronically Signed   By: Virgina Norfolk M.D.   On: 11/14/2021 20:03   CT HEAD WO CONTRAST (5MM)  Result Date: 11/14/2021 CLINICAL DATA:  Altered mental status.  Neurologic deficit. EXAM: CT HEAD WITHOUT CONTRAST TECHNIQUE: Contiguous axial images were obtained from the base of the skull through the vertex without intravenous contrast. COMPARISON:  None. FINDINGS: Brain: No acute intracranial hemorrhage. No focal mass lesion. No CT evidence of acute infarction. No midline shift or mass effect. No hydrocephalus. Basilar cisterns are patent. There are mild periventricular and subcortical white matter hypodensities. Generalized cortical atrophy. Vascular: No hyperdense vessel or unexpected calcification. Skull: Normal. Negative for fracture or focal lesion. Sinuses/Orbits: Paranasal sinuses and mastoid air cells are clear. Orbits are clear. Other: None. IMPRESSION: 1. No acute intracranial findings. 2. Mild subcortical white matter microvascular disease. Electronically Signed   By: Suzy Bouchard M.D.   On: 11/14/2021 21:23   Intravitreal Injection, Pharmacologic Agent - OS - Left Eye  Result Date: 12/27/2021 Time Out 12/27/2021. 11:06 AM. Confirmed correct patient, procedure, site, and patient consented. Anesthesia Topical anesthesia was used. Anesthetic medications included Lidocaine 4%. Procedure Preparation included 10% betadine to eyelids, 5% betadine to ocular  surface, Ofloxacin . A 30 gauge needle was used. Injection: 2.5 mg bevacizumab 2.5 MG/0.1ML   Route: Intravitreal, Site: Left Eye   NDC: [id number], Lot: [id number] Post-op Post injection exam found visual acuity of at least counting fingers. The patient tolerated the procedure well. There were no complications. The patient received written and verbal post procedure care education. Post injection medications included ocuflox.   Intravitreal Injection, Pharmacologic Agent - OS - Left Eye  Result Date: 11/15/2021 Time Out 11/15/2021. 11:34 AM. Confirmed correct patient, procedure, site, and patient consented. Anesthesia Topical anesthesia was used. Anesthetic medications included Lidocaine 4%. Procedure Preparation included 10% betadine to eyelids, 5% betadine to ocular surface, Ofloxacin . A 30 gauge needle was used. Injection: 2.5 mg bevacizumab 2.5 MG/0.1ML   Route: Intravitreal, Site: Left Eye   NDC: [phone], Lot: [id number] Post-op Post injection exam found visual acuity of at least counting fingers. The patient tolerated the procedure well. There were no complications. The patient received written and verbal post procedure care education. Post injection medications included ocuflox.   OCT, Retina - OU - Both Eyes  Result Date: 12/27/2021 Right Eye Quality was good. Scan locations included subfoveal. Central Foveal Thickness: 314. Progression has improved. Findings include abnormal foveal contour. Left Eye Quality was good. Scan locations included subfoveal. Central Foveal Thickness: 317. Progression has improved. Findings include cystoid macular edema, abnormal foveal contour. Notes OD with minor intraretinal fluid, status post vitrectomy membrane peel  much less thickening, much less outer retinal disturbance as compared to epiretinal membrane present November 2021 OS with perifoveal thickening with intraretinal fluid and extension to the outer retina inferonasal to  FAZ consistent with a rap like  lesion when correlated with clinical findings on macular evaluation, today with much less CME and intraretinal fluid post recent Avastin.  Vastly improved as compared to onset October 2022 will need repeat injection Avastin today and examination again in 7 weeks  OCT, Retina - OU - Both Eyes  Result Date: 11/15/2021 Right Eye Quality was good. Scan locations included subfoveal. Central Foveal Thickness: 311. Progression has improved. Findings include abnormal foveal contour. Left Eye Quality was good. Scan locations included subfoveal. Central Foveal Thickness: 304. Progression has improved. Findings include cystoid macular edema, abnormal foveal contour. Notes OD with minor intraretinal fluid, status post vitrectomy membrane peel  much less thickening, much less outer retinal disturbance as compared to epiretinal membrane present November 2021 OS with perifoveal thickening with intraretinal fluid and extension to the outer retina inferonasal to FAZ consistent with a rap like lesion when correlated with clinical findings on macular evaluation, today with much less CME and intraretinal fluid post recent Avastin.  Will need repeat injection Avastin today and examination again in 6 weeks   ASSESSMENT & PLAN:   1. Anemia in stage 4 chronic kidney disease ([place])   2. ANCA-positive vasculitis   3. Stage 4 chronic kidney disease ([place])    #Anemia in chronic kidney disease  Labs reviewed and discussed with patient.  Hemoglobin 9.3. Proceed with Retacrit 20,000 units.  #Iron panel shows a ferritin less than 200.  Recommend patient to receive with IV Venofer treatment 200 mg x 1 today.  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  Continue follow-up with rheumatology. #CKD Stage IV.  Avoid nephro toxin.  Follow-up with nephrology.  There is plan for patient to start on dialysis. Per patient, patient nephrology will take over erythropoietin replacement after she starts dialysis.  We will continue the treatment  until then.   Patient will follow-up IV Venofer 200 mg x 1. H&H in 2, 4, 6, 8, 10 weeks +/- Retacrit  Follow-up in 12 weeks.  Labs NP +/- Retacrit.   Earlie Server, MD 01/12/22 10:17 PM

## 2022-01-12 NOTE — Progress Notes (Signed)
Pt in for follow up, states she is going to start dialysis in February.

## 2022-01-13 ENCOUNTER — Encounter
Admission: RE | Admit: 2022-01-13 | Discharge: 2022-01-13 | Disposition: A | Discharge status: Home / Self Care | Payer: Medicare PPO | Source: Ambulatory Visit | Attending: Surgery | Admitting: Surgery

## 2022-01-13 ENCOUNTER — Telehealth: Payer: Self-pay | Admitting: Surgery

## 2022-01-13 ENCOUNTER — Other Ambulatory Visit: Payer: Self-pay

## 2022-01-13 DIAGNOSIS — Z79899 Other long term (current) drug therapy: Secondary | ICD-10-CM

## 2022-01-13 HISTORY — DX: Unspecified macular degeneration: H35.30

## 2022-01-13 HISTORY — DX: Transient cerebral ischemic attack, unspecified: G45.9

## 2022-01-13 HISTORY — DX: Atherosclerotic heart disease of native coronary artery without angina pectoris: I25.10

## 2022-01-13 HISTORY — DX: Cardiac murmur, unspecified: R01.1

## 2022-01-13 HISTORY — DX: Heart failure, unspecified: I50.9

## 2022-01-13 HISTORY — DX: End stage renal disease: N18.6

## 2022-01-13 HISTORY — DX: Occlusion and stenosis of bilateral carotid arteries: I65.23

## 2022-01-13 HISTORY — DX: Gastro-esophageal reflux disease without esophagitis: K21.9

## 2022-01-13 HISTORY — DX: Anemia, unspecified: D64.9

## 2022-01-13 HISTORY — DX: Malignant (primary) neoplasm, unspecified: C80.1

## 2022-01-13 HISTORY — DX: Unspecified osteoarthritis, unspecified site: M19.90

## 2022-01-13 HISTORY — DX: Peripheral vascular disease, unspecified: I73.9

## 2022-01-13 NOTE — H&P (View-Only) (Signed)
Patient ID: Alexandria Moody, female   DOB: 12/27/1945, 76 y.o.   MRN: [id number]  HPI Alexandria Moody is a 76 y.o. female seen in consultation at the request of Dr.Lateef for laparoscopic PD catheter placement.  He does have a history of end-stage renal disease and is approaching dialysis.  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  Recent EGFR of was 14.  She endorses some chronic active fatigue and some , hemoglobin A1c 5.9 dyspnea on exertion.  Last creatinine is 3.1 potassium 5.4, hemoglobin 10.4 and platelets of 264k. She sees Dr. Gwen Pounds  for CHF, CAD and stable angina, She is not taking any thinners.  She is followed by rheumatology. She had a prior CABG years ago. She recently had a chest x-ray that I  have personally reviewed showing some evidence of mild pulmonary edema.  HPI  Past Medical History:  Diagnosis Date   Diabetes mellitus without complication ([facility])    Hypertension     Past Surgical History:  Procedure Laterality Date   CATARACT EXTRACTION Left 2014   EYE SURGERY Left 07/22/2020   Dr. Kristine Royal, [facility] and Shela Nevin    History reviewed. No pertinent family history.  Social History Social History   Tobacco Use   Smoking status: Never   Smokeless tobacco: Never    Allergies  Allergen Reactions   Tape     Tears skin    Current Outpatient Medications  Medication Sig Dispense Refill   acetaminophen (TYLENOL) 325 MG tablet Take 650 mg by mouth every 6 (six) hours as needed for moderate pain or headache.     albuterol (VENTOLIN HFA) 108 (90 Base) MCG/ACT inhaler Inhale 2 puffs into the lungs every 6 (six) hours as needed for wheezing or shortness of breath.     amLODipine (NORVASC) 5 MG tablet Take 5 mg by mouth 2 (two) times daily.     aspirin 81 MG EC tablet Take 81 mg by mouth daily.     calcitRIOL (ROCALTROL) 0.25 MCG capsule Take 0.25 mcg by mouth daily.     Cholecalciferol 125 MCG (5000 UT) capsule Take 5,000 Units by  mouth daily.     citalopram (CELEXA) 20 MG tablet Take 20 mg by mouth daily.     COVID-19 mRNA bivalent vaccine, Moderna, (MODERNA COVID-19 BIVAL BOOSTER) 50 MCG/0.5ML injection Inject into the muscle. (Patient not taking: Reported on 01/12/2022) 0.5 mL 0   folic acid (V-R FOLIC ACID) 400 MCG tablet Take 1 tablet (400 mcg total) by mouth daily. (Patient not taking: Reported on 01/12/2022) 30 tablet 0   gabapentin (NEURONTIN) 100 MG capsule Take 200 mg by mouth at bedtime as needed (pain).     hydrALAZINE (APRESOLINE) 50 MG tablet Take 50 mg by mouth in the morning and at bedtime.     insulin glargine, 1 Unit Dial, (TOUJEO SOLOSTAR) 300 UNIT/ML Solostar Pen Inject 32 Units into the skin daily.     Insulin Pen Needle (BD PEN NEEDLE NANO 2ND GEN) 32G X 4 MM MISC daily.     ipratropium (ATROVENT) 0.06 % nasal spray Place 2 sprays into both nostrils 3 (three) times daily as needed for rhinitis.     isosorbide mononitrate (IMDUR) 120 MG 24 hr tablet Take 120 mg by mouth at bedtime.     levothyroxine (SYNTHROID) 75 MCG tablet Take 75 mcg by mouth daily before breakfast.     linagliptin (TRADJENTA) 5 MG TABS tablet Take 5 mg by mouth daily.  losartan (COZAAR) 25 MG tablet Take 25 mg by mouth daily.     nitroGLYCERIN (NITROSTAT) 0.4 MG SL tablet Place 0.4 mg under the tongue every 5 (five) minutes as needed for chest pain.     Omega-3 Fatty Acids (FISH OIL) 1000 MG CAPS Take 1,000 mg by mouth in the morning and at bedtime.     omeprazole (PRILOSEC) 20 MG capsule Take 20 mg by mouth daily.     patiromer (VELTASSA) 8.4 g packet Take 8.4 g by mouth daily.     ranolazine (RANEXA) 500 MG 12 hr tablet Take 500 mg by mouth 2 (two) times daily.     rosuvastatin (CRESTOR) 20 MG tablet Take 20 mg by mouth daily.     torsemide (DEMADEX) 20 MG tablet Take 20 mg by mouth daily.     fluocinonide (LIDEX) 0.05 % external solution Apply 1 application topically 2 (two) times daily as needed (scalp irritation).      hydroxychloroquine (PLAQUENIL) 200 MG tablet Take 200 mg by mouth daily.     No current facility-administered medications for this visit.     Review of Systems Full ROS  was asked and was negative except for the information on the HPI  Physical Exam Blood pressure 138/71, pulse (!) 59, temperature 97.7 F (36.5 C), temperature source Oral, height $RemoveBefo'5\' 3"'[id number]$  (1.6 m), weight 156 lb 12.8 oz (71.1 kg), SpO2 91 %. CONSTITUTIONAL: NAD. EYES: Pupils are equal, round,  Sclera are non-icteric. EARS, NOSE, MOUTH AND THROAT: She is wearing a mask Hearing is intact to voice. LYMPH NODES:  Lymph nodes in the neck are normal. RESPIRATORY:  Lungs are clear. There is normal respiratory effort, with equal breath sounds bilaterally, and without pathologic use of accessory muscles. CARDIOVASCULAR: Heart is regular with systolic murmur, no gallops, or rubs. GI: The abdomen is  soft, nontender, and nondistended. There are no palpable masses. There is no hepatosplenomegaly. There are normal bowel sounds in all quadrants. GU: Rectal deferred.   MUSCULOSKELETAL: Normal muscle strength and tone. No cyanosis or edema.   SKIN: Turgor is good and there are no pathologic skin lesions or ulcers. NEUROLOGIC: Motor and sensation is grossly normal. Cranial nerves are grossly intact. PSYCH:  Oriented to person, place and time. Affect is normal.  Data Reviewed  I have personally reviewed the patient's imaging, laboratory findings and medical records.    Assessment/Plan 76 year old female with progressive chronic kidney disease in need for future dialysis.  Discussed with the patient in detail about modes of dialysis.  IMA versus peritoneal dialysis.  She will be very interested in pursuing peritoneal dialysis since he will be ambulatory and will preserve her independence. I think that she will be a good candidate but I want to make sure that cardiology has no objections for the patient to be put to sleep.  Procedure  discussed with patient detail.  Risks, benefits and possible complications including but not limited to, bleeding, infection, catheter malfunction, bowel injuries.  She understands and wished to proceed.  We will schedule pending cardiology clearance and patient schedule Please note that I spent greater than 60 minutes in this encounter including personally reviewing imaging studies, placing orders, counseling the patient and coordinating her care as well as performing appropriate documentation  Caroleen Hamman, MD FACS General Surgeon 01/13/2022, 2:24 PM

## 2022-01-13 NOTE — Telephone Encounter (Signed)
Outgoing call is made, left message for patient to call.  Please inform patient of the following:  Pre-Admission date/time, COVID Testing date and Surgery date.  Surgery Date: 02/03/22 Preadmission Testing Date: 01/13/22 (phone 1p-5p) Covid Testing Date: Not needed.    Also patient will need to call at [phone], between 1-3:00pm the day before surgery, to find out what time to arrive for surgery.

## 2022-01-13 NOTE — Progress Notes (Signed)
Patient ID: Alexandria Moody, female   DOB: 03/29/1946, 76 y.o.   MRN: [id number]  HPI Alexandria Moody is a 76 y.o. female seen in consultation at the request of Dr.Lateef for laparoscopic PD catheter placement.  He does have a history of end-stage renal disease and is approaching dialysis.  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  Recent EGFR of was 14.  She endorses some chronic active fatigue and some , hemoglobin A1c 5.9 dyspnea on exertion.  Last creatinine is 3.1 potassium 5.4, hemoglobin 10.4 and platelets of 264k. She sees Dr. Nehemiah Massed  for CHF, CAD and stable angina, She is not taking any thinners.  She is followed by rheumatology. She had a prior CABG years ago. She recently had a chest x-ray that I  have personally reviewed showing some evidence of mild pulmonary edema.  HPI  Past Medical History:  Diagnosis Date   Diabetes mellitus without complication ([facility])    Hypertension     Past Surgical History:  Procedure Laterality Date   CATARACT EXTRACTION Left 2014   EYE SURGERY Left 07/22/2020   Dr. Towanda Octave, [facility] and [facility]    History reviewed. No pertinent family history.  Social History Social History   Tobacco Use   Smoking status: Never   Smokeless tobacco: Never    Allergies  Allergen Reactions   Tape     Tears skin    Current Outpatient Medications  Medication Sig Dispense Refill   acetaminophen (TYLENOL) 325 MG tablet Take 650 mg by mouth every 6 (six) hours as needed for moderate pain or headache.     albuterol (VENTOLIN HFA) 108 (90 Base) MCG/ACT inhaler Inhale 2 puffs into the lungs every 6 (six) hours as needed for wheezing or shortness of breath.     amLODipine (NORVASC) 5 MG tablet Take 5 mg by mouth 2 (two) times daily.     aspirin 81 MG EC tablet Take 81 mg by mouth daily.     calcitRIOL (ROCALTROL) 0.25 MCG capsule Take 0.25 mcg by mouth daily.     Cholecalciferol 125 MCG (5000 UT) capsule Take 5,000 Units by  mouth daily.     citalopram (CELEXA) 20 MG tablet Take 20 mg by mouth daily.     COVID-19 mRNA bivalent vaccine, Moderna, (MODERNA COVID-19 BIVAL BOOSTER) 50 MCG/0.5ML injection Inject into the muscle. (Patient not taking: Reported on 2/84/1324) 0.5 mL 0   folic acid (V-R FOLIC ACID) 401 MCG tablet Take 1 tablet (400 mcg total) by mouth daily. (Patient not taking: Reported on 01/12/2022) 30 tablet 0   gabapentin (NEURONTIN) 100 MG capsule Take 200 mg by mouth at bedtime as needed (pain).     hydrALAZINE (APRESOLINE) 50 MG tablet Take 50 mg by mouth in the morning and at bedtime.     insulin glargine, 1 Unit Dial, (TOUJEO SOLOSTAR) 300 UNIT/ML Solostar Pen Inject 32 Units into the skin daily.     Insulin Pen Needle (BD PEN NEEDLE NANO 2ND GEN) 32G X 4 MM MISC daily.     ipratropium (ATROVENT) 0.06 % nasal spray Place 2 sprays into both nostrils 3 (three) times daily as needed for rhinitis.     isosorbide mononitrate (IMDUR) 120 MG 24 hr tablet Take 120 mg by mouth at bedtime.     levothyroxine (SYNTHROID) 75 MCG tablet Take 75 mcg by mouth daily before breakfast.     linagliptin (TRADJENTA) 5 MG TABS tablet Take 5 mg by mouth daily.  losartan (COZAAR) 25 MG tablet Take 25 mg by mouth daily.     nitroGLYCERIN (NITROSTAT) 0.4 MG SL tablet Place 0.4 mg under the tongue every 5 (five) minutes as needed for chest pain.     Omega-3 Fatty Acids (FISH OIL) 1000 MG CAPS Take 1,000 mg by mouth in the morning and at bedtime.     omeprazole (PRILOSEC) 20 MG capsule Take 20 mg by mouth daily.     patiromer (VELTASSA) 8.4 g packet Take 8.4 g by mouth daily.     ranolazine (RANEXA) 500 MG 12 hr tablet Take 500 mg by mouth 2 (two) times daily.     rosuvastatin (CRESTOR) 20 MG tablet Take 20 mg by mouth daily.     torsemide (DEMADEX) 20 MG tablet Take 20 mg by mouth daily.     fluocinonide (LIDEX) 0.05 % external solution Apply 1 application topically 2 (two) times daily as needed (scalp irritation).      hydroxychloroquine (PLAQUENIL) 200 MG tablet Take 200 mg by mouth daily.     No current facility-administered medications for this visit.     Review of Systems Full ROS  was asked and was negative except for the information on the HPI  Physical Exam Blood pressure 138/71, pulse (!) 59, temperature 97.7 F (36.5 C), temperature source Oral, height $RemoveBefo'5\' 3"'BQhDaAgkXBL$  (1.6 m), weight 156 lb 12.8 oz (71.1 kg), SpO2 91 %. CONSTITUTIONAL: NAD. EYES: Pupils are equal, round,  Sclera are non-icteric. EARS, NOSE, MOUTH AND THROAT: She is wearing a mask Hearing is intact to voice. LYMPH NODES:  Lymph nodes in the neck are normal. RESPIRATORY:  Lungs are clear. There is normal respiratory effort, with equal breath sounds bilaterally, and without pathologic use of accessory muscles. CARDIOVASCULAR: Heart is regular with systolic murmur, no gallops, or rubs. GI: The abdomen is  soft, nontender, and nondistended. There are no palpable masses. There is no hepatosplenomegaly. There are normal bowel sounds in all quadrants. GU: Rectal deferred.   MUSCULOSKELETAL: Normal muscle strength and tone. No cyanosis or edema.   SKIN: Turgor is good and there are no pathologic skin lesions or ulcers. NEUROLOGIC: Motor and sensation is grossly normal. Cranial nerves are grossly intact. PSYCH:  Oriented to person, place and time. Affect is normal.  Data Reviewed  I have personally reviewed the patient's imaging, laboratory findings and medical records.    Assessment/Plan 76 year old female with progressive chronic kidney disease in need for future dialysis.  Discussed with the patient in detail about modes of dialysis.  IMA versus peritoneal dialysis.  She will be very interested in pursuing peritoneal dialysis since he will be ambulatory and will preserve her independence. I think that she will be a good candidate but I want to make sure that cardiology has no objections for the patient to be put to sleep.  Procedure  discussed with patient detail.  Risks, benefits and possible complications including but not limited to, bleeding, infection, catheter malfunction, bowel injuries.  She understands and wished to proceed.  We will schedule pending cardiology clearance and patient schedule Please note that I spent greater than 60 minutes in this encounter including personally reviewing imaging studies, placing orders, counseling the patient and coordinating her care as well as performing appropriate documentation  Caroleen Hamman, MD FACS General Surgeon 01/13/2022, 2:24 PM

## 2022-01-13 NOTE — Patient Instructions (Addendum)
Your procedure is scheduled on:02-03-22 Friday Report to the Registration Desk on the 1st floor of the [facility].Then proceed to the 2nd floor Surgery Desk in the [facility] To find out your arrival time, please call [phone] between 1PM - 3PM on:02-02-22 Thursday  REMEMBER: Instructions that are not followed completely may result in serious medical risk, up to and including death; or upon the discretion of your surgeon and anesthesiologist your surgery may need to be rescheduled.  Do not eat food after midnight the night before surgery.  No gum chewing, lozengers or hard candies.  You may however, drink Water up to 2 hours before you are scheduled to arrive for your surgery. Do not drink anything within 2 hours of your scheduled arrival time.  Type 1 and Type 2 diabetics should only drink water.  TAKE THESE MEDICATIONS THE MORNING OF SURGERY WITH A SIP OF WATER: -amLODipine (NORVASC) -citalopram (CELEXA) -hydrALAZINE (APRESOLINE)  -levothyroxine (SYNTHROID) -ranolazine (RANEXA)  -rosuvastatin (CRESTOR)  -omeprazole (PRILOSEC)-take one the night before and one on the morning of surgery - helps to prevent nausea after surgery.)  Use your Albuterol Inhaler the morning of surgery and bring your Albuterol Inhaler to the hospital  Do NOT take any Insulin the morning of surgery  One week prior to surgery: Stop Anti-inflammatories (NSAIDS) such as Advil, Aleve, Ibuprofen, Motrin, Naproxen, Naprosyn and Aspirin based products such as Excedrin, Goodys Powder, BC Powder.You may however, continue to take Tylenol if needed for pain up until the day of surgery.  Stop ANY OVER THE COUNTER supplements/vitamins 7 days prior to surgery (Omega-3 Fatty Acids (FISH OIL), Cholecalciferol)  No Alcohol for 24 hours before or after surgery.  No Smoking including e-cigarettes for 24 hours prior to surgery.  No chewable tobacco products for at least 6 hours prior to surgery.  No nicotine patches  on the day of surgery.  Do not use any "recreational" drugs for at least a week prior to your surgery.  Please be advised that the combination of cocaine and anesthesia may have negative outcomes, up to and including death. If you test positive for cocaine, your surgery will be cancelled.  On the morning of surgery brush your teeth with toothpaste and water, you may rinse your mouth with mouthwash if you wish. Do not swallow any toothpaste or mouthwash.  Use CHG Soap or wipes as directed on instruction sheet.  Do not wear jewelry, make-up, hairpins, clips or nail polish.  Do not wear lotions, powders, or perfumes.   Do not shave body from the neck down 48 hours prior to surgery just in case you cut yourself which could leave a site for infection.  Also, freshly shaved skin may become irritated if using the CHG soap.  Contact lenses, hearing aids and dentures may not be worn into surgery.  Do not bring valuables to the hospital. [facility] is not responsible for any missing/lost belongings or valuables.   Notify your doctor if there is any change in your medical condition (cold, fever, infection).  Wear comfortable clothing (specific to your surgery type) to the hospital.  After surgery, you can help prevent lung complications by doing breathing exercises.  Take deep breaths and cough every 1-2 hours. Your doctor may order a device called an Incentive Spirometer to help you take deep breaths. When coughing or sneezing, hold a pillow firmly against your incision with both hands. This is called splinting. Doing this helps protect your incision. It also decreases belly discomfort.  If you are being admitted to the hospital overnight, leave your suitcase in the car. After surgery it may be brought to your room.  If you are being discharged the day of surgery, you will not be allowed to drive home. You will need a responsible adult (18 years or older) to drive you home and stay with  you that night.   If you are taking public transportation, you will need to have a responsible adult (18 years or older) with you. Please confirm with your physician that it is acceptable to use public transportation.   Please call the [facility] Dept. at [phone] if you have any questions about these instructions.  Surgery Visitation Policy:  Patients undergoing a surgery or procedure may have one family member or support person with them as long as that person is not COVID-19 positive or experiencing its symptoms.  That person may remain in the waiting area during the procedure and may rotate out with other people.  Inpatient Visitation:    Visiting hours are 7 a.m. to 8 p.m. Up to two visitors ages 16+ are allowed at one time in a patient room. The visitors may rotate out with other people during the day. Visitors must check out when they leave, or other visitors will not be allowed. One designated support person may remain overnight. The visitor must pass COVID-19 screenings, use hand sanitizer when entering and exiting the patients room and wear a mask at all times, including in the patients room. Patients must also wear a mask when staff or their visitor are in the room. Masking is required regardless of vaccination status.

## 2022-01-16 NOTE — Telephone Encounter (Signed)
Udated information regarding rescheduled surgery due to patient pending cardiac clearance.     Patient has been advised of Pre-Admission date/time, COVID Testing date and Surgery date.  Surgery Date: 02/03/22 Preadmission Testing Date: 01/26/22 (phone 8a-1p) Covid Testing Date: Not needed.     Patient has been made aware to call [phone], between 1-3:00pm the day before surgery, to find out what time to arrive for surgery.

## 2022-01-18 ENCOUNTER — Other Ambulatory Visit: Payer: Self-pay

## 2022-01-18 ENCOUNTER — Inpatient Hospital Stay: Payer: Medicare PPO | Attending: Oncology

## 2022-01-18 VITALS — BP 136/47 | HR 52 | Temp 96.0°F | Resp 18

## 2022-01-18 DIAGNOSIS — Z992 Dependence on renal dialysis: Secondary | ICD-10-CM | POA: Insufficient documentation

## 2022-01-18 DIAGNOSIS — I7782 Antineutrophilic cytoplasmic antibody (ANCA) vasculitis: Secondary | ICD-10-CM | POA: Insufficient documentation

## 2022-01-18 DIAGNOSIS — N184 Chronic kidney disease, stage 4 (severe): Secondary | ICD-10-CM | POA: Insufficient documentation

## 2022-01-18 DIAGNOSIS — Z79899 Other long term (current) drug therapy: Secondary | ICD-10-CM | POA: Diagnosis not present

## 2022-01-18 DIAGNOSIS — D631 Anemia in chronic kidney disease: Secondary | ICD-10-CM | POA: Diagnosis present

## 2022-01-18 DIAGNOSIS — N189 Chronic kidney disease, unspecified: Secondary | ICD-10-CM

## 2022-01-18 MED ORDER — SODIUM CHLORIDE 0.9 % IV SOLN: 200.0000 mg | Freq: Once | INTRAVENOUS | Status: DC

## 2022-01-18 MED ORDER — SODIUM CHLORIDE 0.9 % IV SOLN
Freq: Once | INTRAVENOUS | Status: AC
  Filled 2022-01-18: qty 250

## 2022-01-18 MED ORDER — IRON SUCROSE 20 MG/ML IV SOLN
200.0000 mg | Freq: Once | INTRAVENOUS | Status: AC
  Administered 2022-01-18: 200 mg via INTRAVENOUS
  Filled 2022-01-18: qty 10

## 2022-01-18 NOTE — Patient Instructions (Signed)
[facility]  Discharge Instructions: °Thank you for choosing [facility] to provide your oncology and hematology care.  ° °If you have a lab appointment with the Cancer Center, please go directly to the Cancer Center and check in at the registration area. °  °Wear comfortable clothing and clothing appropriate for easy access to any Portacath or PICC line.  ° °We strive to give you quality time with your provider. You may need to reschedule your appointment if you arrive late (15 or more minutes).  Arriving late affects you and other patients whose appointments are after yours.  Also, if you miss three or more appointments without notifying the office, you may be dismissed from the clinic at the provider’s discretion.    °  °For prescription refill requests, have your pharmacy contact our office and allow 72 hours for refills to be completed.   ° °Today you received the following chemotherapy and/or immunotherapy agents     °  °To help prevent nausea and vomiting after your treatment, we encourage you to take your nausea medication as directed. ° °BELOW ARE SYMPTOMS THAT SHOULD BE REPORTED IMMEDIATELY: °*FEVER GREATER THAN 100.4 F (38 °C) OR HIGHER °*CHILLS OR SWEATING °*NAUSEA AND VOMITING THAT IS NOT CONTROLLED WITH YOUR NAUSEA MEDICATION °*UNUSUAL SHORTNESS OF BREATH °*UNUSUAL BRUISING OR BLEEDING °*URINARY PROBLEMS (pain or burning when urinating, or frequent urination) °*BOWEL PROBLEMS (unusual diarrhea, constipation, pain near the anus) °TENDERNESS IN MOUTH AND THROAT WITH OR WITHOUT PRESENCE OF ULCERS (sore throat, sores in mouth, or a toothache) °UNUSUAL RASH, SWELLING OR PAIN  °UNUSUAL VAGINAL DISCHARGE OR ITCHING  ° °Items with * indicate a potential emergency and should be followed up as soon as possible or go to the Emergency Department if any problems should occur. ° °Please show the CHEMOTHERAPY ALERT CARD or IMMUNOTHERAPY ALERT CARD at check-in to the  Emergency Department and triage nurse. ° °Should you have questions after your visit or need to cancel or reschedule your appointment, please contact [facility]  Dept: [phone]  and follow the prompts.  Office hours are 8:00 a.m. to 4:30 p.m. Monday - Friday. Please note that voicemails left after 4:00 p.m. may not be returned until the following business day.  We are closed weekends and major holidays. You have access to a nurse at all times for urgent questions. Please call the main number to the clinic Dept: [phone] and follow the prompts. ° ° °For any non-urgent questions, you may also contact your provider using MyChart. We now offer e-Visits for anyone 18 and older to request care online for non-urgent symptoms. For details visit [email or web address]. °  °Also download the MyChart app! Go to the app store, search "MyChart", open the app, select [facility], and log in with your MyChart username and password. ° °Due to Covid, a mask is required upon entering the hospital/clinic. If you do not have a mask, one will be given to you upon arrival. For doctor visits, patients may have 1 support person aged 18 or older with them. For treatment visits, patients cannot have anyone with them due to current Covid guidelines and our immunocompromised population.  ° °

## 2022-01-19 ENCOUNTER — Telehealth: Payer: Self-pay | Admitting: Oncology

## 2022-01-19 NOTE — Telephone Encounter (Signed)
Pt called to cancel her appts for 2-9. Calll back at [phone] to reschedule.

## 2022-01-19 NOTE — Telephone Encounter (Signed)
Spoke to pt, I have cancelled appointments 2/9.

## 2022-01-26 ENCOUNTER — Inpatient Hospital Stay: Payer: Medicare PPO

## 2022-01-26 ENCOUNTER — Inpatient Hospital Stay: Admission: RE | Admit: 2022-01-26 | Payer: Medicare PPO | Source: Ambulatory Visit

## 2022-01-27 ENCOUNTER — Ambulatory Visit
Admission: RE | Admit: 2022-01-27 | Discharge: 2022-01-27 | Disposition: A | Discharge status: Home / Self Care | Payer: Medicare PPO | Source: Ambulatory Visit | Attending: Nephrology | Admitting: Nephrology

## 2022-01-27 ENCOUNTER — Other Ambulatory Visit: Payer: Self-pay

## 2022-01-27 ENCOUNTER — Encounter: Payer: Self-pay | Admitting: Surgery

## 2022-01-27 DIAGNOSIS — N185 Chronic kidney disease, stage 5: Secondary | ICD-10-CM | POA: Insufficient documentation

## 2022-01-27 MED ORDER — FUROSEMIDE 10 MG/ML IJ SOLN
INTRAMUSCULAR | Status: AC
  Administered 2022-01-27: 80 mg via INTRAVENOUS
  Filled 2022-01-27: qty 8

## 2022-01-27 MED ORDER — FUROSEMIDE 10 MG/ML IJ SOLN: 80.0000 mg | Freq: Once | INTRAMUSCULAR | Status: AC

## 2022-01-27 NOTE — Progress Notes (Signed)
Perioperative Services  Pre-Admission/Anesthesia Testing Clinical Review  Date: 01/30/22  Patient Demographics:  Name: Alexandria Moody DOB:   [birth year] MRN:   [id number]  Planned Surgical Procedure(s):    Case: [id number] Date/Time: 02/03/22 0845   Procedure: LAPAROSCOPIC INSERTION CONTINUOUS AMBULATORY PERITONEAL DIALYSIS  (CAPD) CATHETER   Anesthesia type: General   Pre-op diagnosis: ESRD   Location: [facility] OR ROOM 04 / [facility]S FOR ANEST[facility]ESIA [facility]   Surgeons: Jules Husbands, MD   NOTE: Available PAT nursing documentation and vital signs have been reviewed. Clinical nursing staff has updated patient's PMH/PSHx, current medication list, and drug allergies/intolerances to ensure comprehensive history available to assist in medical decision making as it pertains to the aforementioned surgical procedure and anticipated anesthetic course. Extensive review of available clinical information performed. Alexandria Moody PMH and PSHx updated with any diagnoses/procedures that  may have been inadvertently omitted during her intake with the pre-admission testing department's nursing staff.  Clinical Discussion:  Alexandria Moody is a 76 y.o. female who is submitted for pre-surgical anesthesia review and clearance prior to her undergoing the above procedure. Patient is a Former Smoker (50 pack years; quit 12/1998). 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  Patient is followed by cardiology Nehemiah Massed, MD). She was last seen in the cardiology clinic on 01/24/2022; notes reviewed.  At the time of her clinic visit, patient doing "generally well" over the last several months.  She denied any episodes of chest pain, however reports shortness of breath and  BILATERAL lower extremity edema secondary to worsening renal failure.  She denied any PND, orthopnea, palpitations, vertiginous symptoms, or presyncope/syncope.  PMH significant for cardiovascular diagnoses.  Patient underwent a three-vessel CABG procedure in 2002.  LIMA-LAD, SVG-diagonal, SVG-OM bypass grafts were placed.  TTE performed on 05/12/2020 revealed a normal left ventricular systolic function with an EF of 55-60%.  There were no segmental wall motion abnormalities noted.  Diastolic Doppler parameters consistent with impaired relaxation (G1DD).  There was trivial to mild mitral valve regurgitation.  There was no evidence of a significant transvalvular gradient to suggest valvular stenosis.  Myocardial perfusion imaging study performed on 02/21/2021 revealed a mildly reduced left ventricular systolic function with an EF of 48%.  There was a moderate mixed perfusion abnormality present in the posterior lateral myocardial region on stress images.  Resting images showed minimal perfusion abnormality of mild intensity in the posterior lateral myocardium consistent with previous myocardial infarction and/or scar as well as reversibility of ischemia.  Blood pressure well controlled at 130/48 on currently prescribed CCB, nitrate, ARB, vasodilator, and diuretic therapies.  She is on a statin + omega-3 fatty acid for her HLD and further ASCVD prevention.  T2DM well-controlled on currently prescribed regimen; last HgbA1c was 5.9% when checked on 12/22/2021. Functional capacity, as defined by DASI, is documented as being >/= 4 METS.  No changes were made to her medication regimen.  Patient follow-up with outpatient cardiology in 2 months or sooner if needed.  Alexandria Moody is scheduled for an LAPAROSCOPIC INSERTION CONTINUOUS AMBULATORY PERITONEAL DIALYSIS  (CAPD) CATHETER on 02/03/2022 with Dr. Caroleen Hamman, MD. Given patient's past medical history significant for cardiovascular diagnoses, presurgical cardiac  clearance was sought by the PAT team. "The patient is at the lowest risk possible for perioperative cardiovascular complications with the planned procedure.  The overall risk her procedure is low (<1%).  Currently has no evidence active and/or significant angina and/or congestive heart failure. Patient may proceed to surgery without restriction or need for further cardiovascular testing and an overall ACCEPTABLE risk". This patient is on daily antiplatelet therapy. She has been instructed on recommendations for continuing her daily low-dose ASA throughout the perioperative period.   Patient denies previous perioperative complications with anesthesia in the past. In review of the available records, it is noted that patient underwent a general anesthetic course at [facility] (ASA III) in 07/2020 without documented complications.   Vitals with BMI 01/18/2022 01/18/2022 01/12/2022  Height - - -  Weight - - 157 lbs  BMI - - 29.92  Systolic 426 834 196  Diastolic 47 45 60  Pulse 52 55 59  Some encounter information is confidential and restricted. Go to Review Flowsheets activity to see all data.    Providers/Specialists:   NOTE: Primary physician provider listed below. Patient may have been seen by APP or partner within same practice.   PROVIDER ROLE / SPECIALTY LAST Suszanne Finch, MD General Surgery 01/11/2022  Bubba Camp, [place] Primary Care Provider 01/09/2022  Serafina Royals, MD Cardiology 01/24/2022  Anthonette Legato, MD Nephrology 01/26/2022  Ephriam Jenkins, MD Rheumatology 01/11/2022  Earlie Server, MD Hematology/Oncology 01/12/2022   Allergies:  Tape  Current Home Medications:   No current facility-administered medications for this encounter.    acetaminophen (TYLENOL) 325 MG tablet   albuterol (VENTOLIN HFA) 108 (90 Base) MCG/ACT inhaler   amLODipine (NORVASC) 5 MG tablet   aspirin 81 MG EC tablet   calcitRIOL (ROCALTROL) 0.25 MCG capsule    Cholecalciferol 125 MCG (5000 UT) capsule   citalopram (CELEXA) 20 MG tablet   fluocinonide (LIDEX) 0.05 % external solution   gabapentin (NEURONTIN) 100 MG capsule   hydrALAZINE (APRESOLINE) 50 MG tablet   hydroxychloroquine (PLAQUENIL) 200 MG tablet   insulin glargine, 1 Unit Dial, (TOUJEO SOLOSTAR) 300 UNIT/ML Solostar Pen   ipratropium (ATROVENT) 0.06 % nasal spray   isosorbide mononitrate (IMDUR) 120 MG 24 hr tablet   levothyroxine (SYNTHROID) 75 MCG tablet   linagliptin (TRADJENTA) 5 MG TABS tablet   losartan (COZAAR) 25 MG tablet   nitroGLYCERIN (NITROSTAT) 0.4 MG SL tablet   Omega-3 Fatty Acids (FISH OIL) 1000 MG CAPS   omeprazole (PRILOSEC) 20 MG capsule   patiromer (VELTASSA) 8.4 g packet   ranolazine (RANEXA) 500 MG 12 hr tablet   rosuvastatin (CRESTOR) 20 MG tablet   torsemide (DEMADEX) 20 MG tablet   Insulin Pen Needle (BD PEN NEEDLE NANO 2ND GEN) 32G X 4 MM MISC   History:   Past Medical History:  Diagnosis Date   Anemia of chronic renal failure    a.) recieving iron infusions + epoetin alfa-epbx (Retacrit)   Anginal pain (HCC)    Aortic atherosclerosis (HCC)    Arthritis    Atherosclerosis of native arteries of extremity with intermittent claudication (HCC)    a.) ABI/TBI 06/24/2021: 0.95 RIGHT, 0.72 LEFT   Bilateral carotid artery disease ([place]) 07/25/2021   a.) carotid doppler 07/25/2021: < 22% RICA, < 29% LICA.   Carotid atherosclerosis, bilateral    CHF (congestive heart failure) ([place])    a.) TTE 05/12/2020: LVEF 55-60%; mild LA enlargement; trivial to mild MR; G1DD.   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   a.) smolding phase ANCA associated pauci immune   Heart murmur    HLD  (hyperlipidemia)    Hypertension    Hypothyroidism    Long term current use of immunosuppressive drug    a.) hydroxychloriquine for ANCA (+) glomerulonephritis   Macular degeneration    PAD (peripheral artery disease) ([place])    Pneumonia 11/2021   S/P CABG x 3 2002   a.) LIMA-LAD, SVG-diagnoal, SVG-OM   Skin cancer    T2DM (type 2 diabetes mellitus) ([place])    TIA (transient ischemic attack)    Past Surgical History:  Procedure Laterality Date   ABDOMINAL HYSTERECTOMY     age 10   CATARACT EXTRACTION Bilateral 2014   CHOLECYSTECTOMY  2020   COLONOSCOPY     CORONARY ARTERY BYPASS GRAFT N/A 2002   Procedure: 3v CORONARY ARTERY ARTERY BYPASS GRAFT   EYE SURGERY Left 07/22/2020   Dr. Towanda Octave, Vit and Mem [facility]   No family history on file. Social History   Tobacco Use   Smoking status: Former    Packs/day: 2.00    Years: 25.00    Pack years: 50.00    Types: Cigarettes    Quit date: 2000    Years since quitting: 23.1   Smokeless tobacco: Never  Vaping Use   Vaping Use: Never used  Substance Use Topics   Alcohol use: Never   Drug use: Never    Pertinent Clinical Results:  LABS: Labs reviewed: Acceptable for surgery.   Ref Range & Units 01/26/2022  WBC 3.8 - 10.8 Thousand/uL 8.5   RBC 3.80 - 5.10 Million/uL 2.98 Low    Hemoglobin 11.7 - 15.5 g/dL 9.4 Low    Hematocrit 35.0 - 45.0 % 29.1 Low    MCV 80.0 - 100.0 fL 97.7   MCH 27.0 - 33.0 pg 31.5   MCHC 32.0 - 36.0 g/dL 32.3   RDW 11.0 - 15.0 % 13.9   Platelets 140 - 400 Thousand/uL 350   MPV 7.5 - 12.5 fL 9.1   Neutrophils Absolute 1500 - 7800 cells/uL 6,962   Lymphocytes Absolute 850 - 3900 cells/uL 825 Low    Monocytes Absolute 200 - 950 cells/uL 459   Eosinophils Absolute 15 - 500 cells/uL 221   Basophils Absolute 0 - 200 cells/uL 34   Neutrophils Relative % 81.9   Lymphocytes % 9.7   Monocytes % 5.4   Eosinophils % 2.6   Basophils Relative % 0.4   Resulting Agency  [facility]    Ref Range & Units  01/26/2022  Glucose 65 - 99 mg/dL 111 High    BUN 7 - 25 mg/dL 53 High    Creatinine 0.60 - 1.00 mg/dL 2.98 High    eGFR CKD-EPI CR 2021 > OR = 60 mL/min/1.36m2 16 Low    BUN/Creatinine Ratio 6 - 22 (calc) 18   Sodium 135 - 146 mmol/L 140   Potassium 3.5 - 5.3 mmol/L 4.8   Chloride 98 - 110 mmol/L 110   Bicarbonate (CO2) 20 - 32 mmol/L 21   Calcium 8.6 - 10.4 mg/dL 8.5 Low    Phosphorus 2.1 - 4.3 mg/dL 3.9   Albumin 3.6 - 5.1 g/dL 3.3 Low    Resulting Agency  [facility]    ECG: Date: 11/14/2021 Time ECG obtained: 1500 PM Rate: 59 bpm Rhythm:  Sinus bradycardia with first-degree AV block Axis (leads I and aVF): Normal Intervals: PR 212 ms. QRS 98 ms. QTc 469 ms. ST segment and T wave changes: LVH with repolarization abnormality  Comparison: Sinus bradycardia at a rate of 56 bpm with evidence of an age undetermined septal infarct present on 08/26/2021 tracing.   IMAGING / PROCEDURES: DIAGNOSTIC RADIOGRAPHS OF CHEST 2 VIEWS performed on 11/14/2021 Evidence of prior median sternotomy/CABG Additional findings suggestive of mild to moderate severity diffuse interstitial edema Underlying chronic interstitial lung disease cannot be excluded  BILATERAL CAROTID DOPPLER performed on 07/25/2021 Bilateral carotid atherosclerosis.  Negative for stenosis.  Degree of narrowing less than 50% bilaterally by ultrasound criteria. Patent antegrade vertebral flow bilaterally  VASCULAR ULTRASOUND ABI WITH/WITHOUT TBI performed on 06/24/2021 Right: Resting right ankle-brachial index is within normal range. Evidence of mild tibial level arterial disease of right lower extremity. Mildly abnormal left TBI.  Left: Resting left ankle-brachial index indicates moderate left lower extremity arterial disease. The left toe-brachial index is abnormal.   MYOCARDIAL PERFUSION IMAGING STUDY (LEXISCAN) performed on 02/21/2021 LVEF 48%  Normal myocardial thickening and wall motion No artifacts noted Left  ventricular cavity size normal SPECT images demonstrate moderate mixed perfusion abnormality of moderate intensity present posterior lateral myocardial region on stress images.  Resting images showed minimal perfusion abnormality of mild intensity in the posterior lateral myocardium consistent with possible previous infarct and/or scar as well as reversibility and ischemia.  TRANSTHORACIC ECHOCARDIOGRAM performed on 05/12/2020 LVEF 55-60% Normal left ventricular systolic function Mild left atrial enlargement Diastolic parameters consistent with impaired relaxation (G1DD). Aortic root normal in size No segmental wall motion abnormalities Trivial to mild MR  Impression and Plan:  Warrene Kapfer has been referred for pre-anesthesia review and clearance prior to her undergoing the planned anesthetic and procedural courses. Available labs, pertinent testing, and imaging results were personally reviewed by me. This patient has been appropriately cleared by cardiology with an overall ACCEPTABLE risk of significant perioperative cardiovascular complications.  Based on clinical review performed today (01/30/22), barring any significant acute changes in the patient's overall condition, it is anticipated that she will be able to proceed with the planned surgical intervention. Any acute changes in clinical condition may necessitate her procedure being postponed and/or cancelled. Patient will meet with anesthesia team (MD and/or CRNA) on the day of her procedure for preoperative evaluation/assessment. Questions regarding anesthetic course will be fielded at that time.   Pre-surgical instructions were reviewed with the patient during her PAT appointment and questions were fielded by PAT clinical staff. Patient was advised that if any questions or concerns arise prior to her procedure then she should return a call to PAT and/or her surgeon's office to discuss.  Honor Loh, MSN, APRN, FNP-C, CEN [facility]  Peri-operative Services Nurse Practitioner Phone: [phone] Fax: [phone] 01/30/22 8:27 AM  NOTE: This note has been prepared using Dragon dictation software. Despite my best ability to proofread, there is always the potential that unintentional transcriptional errors may still occur from this process.

## 2022-01-30 ENCOUNTER — Other Ambulatory Visit: Payer: Self-pay

## 2022-01-30 ENCOUNTER — Ambulatory Visit
Admission: RE | Admit: 2022-01-30 | Discharge: 2022-01-30 | Disposition: A | Discharge status: Home / Self Care | Payer: Medicare PPO | Source: Ambulatory Visit | Attending: Nephrology | Admitting: Nephrology

## 2022-01-30 ENCOUNTER — Encounter: Payer: Self-pay | Admitting: Nephrology

## 2022-01-30 ENCOUNTER — Encounter: Payer: Self-pay | Admitting: Surgery

## 2022-01-30 ENCOUNTER — Telehealth: Payer: Self-pay

## 2022-01-30 DIAGNOSIS — R609 Edema, unspecified: Secondary | ICD-10-CM | POA: Diagnosis not present

## 2022-01-30 DIAGNOSIS — N185 Chronic kidney disease, stage 5: Secondary | ICD-10-CM | POA: Insufficient documentation

## 2022-01-30 MED ORDER — FUROSEMIDE 10 MG/ML IJ SOLN
80.0000 mg | Freq: Once | INTRAMUSCULAR | Status: AC
Start: 2022-01-30 — End: 2022-01-30

## 2022-01-30 MED ORDER — FUROSEMIDE 10 MG/ML IJ SOLN
INTRAMUSCULAR | Status: AC
  Administered 2022-01-30: 80 mg via INTRAVENOUS
  Filled 2022-01-30: qty 8

## 2022-01-30 NOTE — Telephone Encounter (Signed)
Cardiac Clearance received from [place] risk and patient is optimized for surgery.

## 2022-02-01 ENCOUNTER — Other Ambulatory Visit: Payer: Self-pay | Admitting: *Deleted

## 2022-02-01 ENCOUNTER — Other Ambulatory Visit: Payer: Self-pay

## 2022-02-01 ENCOUNTER — Ambulatory Visit
Admission: RE | Admit: 2022-02-01 | Discharge: 2022-02-01 | Disposition: A | Discharge status: Home / Self Care | Payer: Medicare PPO | Source: Ambulatory Visit | Attending: Nephrology | Admitting: Nephrology

## 2022-02-01 DIAGNOSIS — R609 Edema, unspecified: Secondary | ICD-10-CM | POA: Diagnosis not present

## 2022-02-01 DIAGNOSIS — D631 Anemia in chronic kidney disease: Secondary | ICD-10-CM

## 2022-02-01 DIAGNOSIS — N185 Chronic kidney disease, stage 5: Secondary | ICD-10-CM | POA: Diagnosis present

## 2022-02-01 DIAGNOSIS — D509 Iron deficiency anemia, unspecified: Secondary | ICD-10-CM

## 2022-02-01 MED ORDER — FUROSEMIDE 10 MG/ML IJ SOLN: 80.0000 mg | Freq: Once | INTRAMUSCULAR | Status: AC

## 2022-02-01 MED ORDER — FUROSEMIDE 10 MG/ML IJ SOLN
INTRAMUSCULAR | Status: AC
  Administered 2022-02-01: 80 mg via INTRAVENOUS
  Filled 2022-02-01: qty 8

## 2022-02-03 ENCOUNTER — Encounter: Payer: Self-pay | Admitting: Surgery

## 2022-02-03 ENCOUNTER — Other Ambulatory Visit: Payer: Self-pay

## 2022-02-03 ENCOUNTER — Emergency Department
Admission: EM | Admit: 2022-02-03 | Discharge: 2022-02-03 | Disposition: A | Discharge status: Home / Self Care | Payer: Medicare PPO | Source: Home / Self Care | Attending: Emergency Medicine | Admitting: Emergency Medicine

## 2022-02-03 ENCOUNTER — Encounter
Admission: RE | Disposition: A | Discharge status: Home / Self Care | Payer: Self-pay | Source: Home / Self Care | Attending: Surgery

## 2022-02-03 ENCOUNTER — Ambulatory Visit
Admission: RE | Admit: 2022-02-03 | Discharge: 2022-02-03 | Disposition: A | Discharge status: Home / Self Care | Payer: Medicare PPO | Attending: Surgery | Admitting: Surgery

## 2022-02-03 ENCOUNTER — Ambulatory Visit: Payer: Medicare PPO | Admitting: Urgent Care

## 2022-02-03 DIAGNOSIS — E039 Hypothyroidism, unspecified: Secondary | ICD-10-CM | POA: Insufficient documentation

## 2022-02-03 DIAGNOSIS — I509 Heart failure, unspecified: Secondary | ICD-10-CM | POA: Diagnosis not present

## 2022-02-03 DIAGNOSIS — I2510 Atherosclerotic heart disease of native coronary artery without angina pectoris: Secondary | ICD-10-CM | POA: Diagnosis not present

## 2022-02-03 DIAGNOSIS — J449 Chronic obstructive pulmonary disease, unspecified: Secondary | ICD-10-CM | POA: Insufficient documentation

## 2022-02-03 DIAGNOSIS — N186 End stage renal disease: Secondary | ICD-10-CM | POA: Diagnosis not present

## 2022-02-03 DIAGNOSIS — Z7984 Long term (current) use of oral hypoglycemic drugs: Secondary | ICD-10-CM | POA: Diagnosis not present

## 2022-02-03 DIAGNOSIS — Z7982 Long term (current) use of aspirin: Secondary | ICD-10-CM | POA: Diagnosis not present

## 2022-02-03 DIAGNOSIS — R339 Retention of urine, unspecified: Secondary | ICD-10-CM | POA: Diagnosis not present

## 2022-02-03 DIAGNOSIS — I132 Hypertensive heart and chronic kidney disease with heart failure and with stage 5 chronic kidney disease, or end stage renal disease: Secondary | ICD-10-CM | POA: Diagnosis not present

## 2022-02-03 DIAGNOSIS — Z951 Presence of aortocoronary bypass graft: Secondary | ICD-10-CM | POA: Diagnosis not present

## 2022-02-03 DIAGNOSIS — K219 Gastro-esophageal reflux disease without esophagitis: Secondary | ICD-10-CM | POA: Diagnosis not present

## 2022-02-03 DIAGNOSIS — Z79890 Hormone replacement therapy (postmenopausal): Secondary | ICD-10-CM | POA: Insufficient documentation

## 2022-02-03 DIAGNOSIS — E785 Hyperlipidemia, unspecified: Secondary | ICD-10-CM | POA: Diagnosis not present

## 2022-02-03 DIAGNOSIS — Z992 Dependence on renal dialysis: Secondary | ICD-10-CM | POA: Diagnosis not present

## 2022-02-03 DIAGNOSIS — Z79899 Other long term (current) drug therapy: Secondary | ICD-10-CM | POA: Insufficient documentation

## 2022-02-03 DIAGNOSIS — E1122 Type 2 diabetes mellitus with diabetic chronic kidney disease: Secondary | ICD-10-CM | POA: Insufficient documentation

## 2022-02-03 DIAGNOSIS — Z794 Long term (current) use of insulin: Secondary | ICD-10-CM | POA: Insufficient documentation

## 2022-02-03 HISTORY — DX: Atherosclerosis of aorta: I70.0

## 2022-02-03 HISTORY — DX: Hypothyroidism, unspecified: E03.9

## 2022-02-03 HISTORY — DX: Long term (current) use of unspecified immunomodulators and immunosuppressants: Z79.60

## 2022-02-03 HISTORY — DX: Anemia in chronic kidney disease: D63.1

## 2022-02-03 HISTORY — DX: Chronic obstructive pulmonary disease, unspecified: J44.9

## 2022-02-03 HISTORY — DX: Unspecified malignant neoplasm of skin, unspecified: C44.90

## 2022-02-03 HISTORY — DX: Hyperlipidemia, unspecified: E78.5

## 2022-02-03 HISTORY — PX: CAPD INSERTION: SHX5233

## 2022-02-03 HISTORY — DX: Other long term (current) drug therapy: Z79.899

## 2022-02-03 HISTORY — DX: Atherosclerosis of native arteries of extremities with intermittent claudication, unspecified extremity: I70.219

## 2022-02-03 HISTORY — DX: Anemia in chronic kidney disease: N18.9

## 2022-02-03 HISTORY — DX: Angina pectoris, unspecified: I20.9

## 2022-02-03 HISTORY — DX: Type 2 diabetes mellitus without complications: E11.9

## 2022-02-03 LAB — URINALYSIS, ROUTINE W REFLEX MICROSCOPIC
Bacteria, UA: NONE SEEN
Bilirubin Urine: NEGATIVE
Glucose, UA: NEGATIVE mg/dL
Hgb urine dipstick: NEGATIVE
Ketones, ur: NEGATIVE mg/dL
Leukocytes,Ua: NEGATIVE
Nitrite: NEGATIVE
Protein, ur: 100 mg/dL — AB
Specific Gravity, Urine: 1.017 (ref 1.005–1.030)
Squamous Epithelial / HPF: NONE SEEN (ref 0–5)
pH: 5 (ref 5.0–8.0)

## 2022-02-03 LAB — CBC
HCT: 26.9 % — ABNORMAL LOW (ref 36.0–46.0)
Hemoglobin: 8.1 g/dL — ABNORMAL LOW (ref 12.0–15.0)
MCH: 31 pg (ref 26.0–34.0)
MCHC: 30.1 g/dL (ref 30.0–36.0)
MCV: 103.1 fL — ABNORMAL HIGH (ref 80.0–100.0)
Platelets: 270 10*3/uL (ref 150–400)
RBC: 2.61 MIL/uL — ABNORMAL LOW (ref 3.87–5.11)
RDW: 15.1 % (ref 11.5–15.5)
WBC: 8 10*3/uL (ref 4.0–10.5)
nRBC: 0 % (ref 0.0–0.2)

## 2022-02-03 LAB — BASIC METABOLIC PANEL
Anion gap: 9 (ref 5–15)
BUN: 66 mg/dL — ABNORMAL HIGH (ref 8–23)
CO2: 21 mmol/L — ABNORMAL LOW (ref 22–32)
Calcium: 8.3 mg/dL — ABNORMAL LOW (ref 8.9–10.3)
Chloride: 105 mmol/L (ref 98–111)
Creatinine, Ser: 3.63 mg/dL — ABNORMAL HIGH (ref 0.44–1.00)
GFR, Estimated: 13 mL/min — ABNORMAL LOW (ref >60)
Glucose, Bld: 138 mg/dL — ABNORMAL HIGH (ref 70–99)
Potassium: 4.7 mmol/L (ref 3.5–5.1)
Sodium: 135 mmol/L (ref 135–145)

## 2022-02-03 LAB — GLUCOSE, CAPILLARY
Glucose-Capillary: 124 mg/dL — ABNORMAL HIGH (ref 70–99)
Glucose-Capillary: 49 mg/dL — ABNORMAL LOW (ref 70–99)
Glucose-Capillary: 135 mg/dL — ABNORMAL HIGH (ref 70–99)

## 2022-02-03 LAB — POCT I-STAT, CHEM 8
BUN: 62 mg/dL — ABNORMAL HIGH (ref 8–23)
Calcium, Ion: 1.22 mmol/L (ref 1.15–1.40)
Chloride: 112 mmol/L — ABNORMAL HIGH (ref 98–111)
Creatinine, Ser: 3.3 mg/dL — ABNORMAL HIGH (ref 0.44–1.00)
Glucose, Bld: 47 mg/dL — ABNORMAL LOW (ref 70–99)
HCT: 25 % — ABNORMAL LOW (ref 36.0–46.0)
Hemoglobin: 8.5 g/dL — ABNORMAL LOW (ref 12.0–15.0)
Potassium: 4.5 mmol/L (ref 3.5–5.1)
Sodium: 142 mmol/L (ref 135–145)
TCO2: 23 mmol/L (ref 22–32)

## 2022-02-03 SURGERY — LAPAROSCOPIC INSERTION CONTINUOUS AMBULATORY PERITONEAL DIALYSIS  (CAPD) CATHETER
Anesthesia: General

## 2022-02-03 MED ORDER — OXYCODONE HCL 5 MG/5ML PO SOLN: 5.0000 mg | Freq: Once | ORAL | Status: AC | PRN

## 2022-02-03 MED ORDER — OXYCODONE HCL 5 MG PO TABS
5.0000 mg | ORAL_TABLET | Freq: Once | ORAL | Status: AC | PRN
  Administered 2022-02-03: 5 mg via ORAL

## 2022-02-03 MED ORDER — PHENYLEPHRINE HCL (PRESSORS) 10 MG/ML IV SOLN
INTRAVENOUS | Status: DC | PRN
  Administered 2022-02-03: 80 ug via INTRAVENOUS

## 2022-02-03 MED ORDER — PROPOFOL 10 MG/ML IV BOLUS
INTRAVENOUS | Status: AC
  Filled 2022-02-03: qty 20

## 2022-02-03 MED ORDER — EPINEPHRINE 1 MG/10ML IJ SOSY
PREFILLED_SYRINGE | INTRAMUSCULAR | Status: DC | PRN
  Administered 2022-02-03: 20 ug via INTRAVENOUS
  Administered 2022-02-03: 10 ug via INTRAVENOUS
  Administered 2022-02-03: 20 ug via INTRAVENOUS
  Administered 2022-02-03: 30 ug via INTRAVENOUS
  Administered 2022-02-03 (×3): 20 ug via INTRAVENOUS

## 2022-02-03 MED ORDER — DEXTROSE 50 % IV SOLN
INTRAVENOUS | Status: AC
  Administered 2022-02-03: 50 mL via INTRAVENOUS
  Filled 2022-02-03: qty 50

## 2022-02-03 MED ORDER — CHLORHEXIDINE GLUCONATE 0.12 % MT SOLN: 15.0000 mL | Freq: Once | OROMUCOSAL | Status: AC

## 2022-02-03 MED ORDER — PROPOFOL 10 MG/ML IV BOLUS
INTRAVENOUS | Status: DC | PRN
  Administered 2022-02-03: 100 mg via INTRAVENOUS

## 2022-02-03 MED ORDER — PROMETHAZINE HCL 25 MG/ML IJ SOLN: 6.2500 mg | INTRAMUSCULAR | Status: DC | PRN

## 2022-02-03 MED ORDER — BUPIVACAINE LIPOSOME 1.3 % IJ SUSP
INTRAMUSCULAR | Status: AC
  Filled 2022-02-03: qty 20

## 2022-02-03 MED ORDER — LIDOCAINE HCL (PF) 2 % IJ SOLN
INTRAMUSCULAR | Status: AC
  Filled 2022-02-03: qty 5

## 2022-02-03 MED ORDER — CEFAZOLIN SODIUM-DEXTROSE 2-4 GM/100ML-% IV SOLN
INTRAVENOUS | Status: AC
  Filled 2022-02-03: qty 100

## 2022-02-03 MED ORDER — ACETAMINOPHEN 500 MG PO TABS: 1000.0000 mg | ORAL_TABLET | ORAL | Status: AC

## 2022-02-03 MED ORDER — SUGAMMADEX SODIUM 200 MG/2ML IV SOLN
INTRAVENOUS | Status: DC | PRN
  Administered 2022-02-03: 200 mg via INTRAVENOUS

## 2022-02-03 MED ORDER — BUPIVACAINE-EPINEPHRINE (PF) 0.25% -1:200000 IJ SOLN
INTRAMUSCULAR | Status: DC | PRN
  Administered 2022-02-03: 50 mL

## 2022-02-03 MED ORDER — CHLORHEXIDINE GLUCONATE CLOTH 2 % EX PADS: 6.0000 | MEDICATED_PAD | Freq: Once | CUTANEOUS | Status: DC

## 2022-02-03 MED ORDER — GLYCOPYRROLATE 0.2 MG/ML IJ SOLN
INTRAMUSCULAR | Status: DC | PRN
  Administered 2022-02-03 (×2): .2 mg via INTRAVENOUS

## 2022-02-03 MED ORDER — HYDROCODONE-ACETAMINOPHEN 5-325 MG PO TABS: 1.0000 | ORAL_TABLET | Freq: Four times a day (QID) | ORAL | 0 refills | Status: DC | PRN

## 2022-02-03 MED ORDER — FENTANYL CITRATE (PF) 100 MCG/2ML IJ SOLN
INTRAMUSCULAR | Status: DC | PRN
  Administered 2022-02-03 (×2): 25 ug via INTRAVENOUS

## 2022-02-03 MED ORDER — LIDOCAINE HCL (CARDIAC) PF 100 MG/5ML IV SOSY
PREFILLED_SYRINGE | INTRAVENOUS | Status: DC | PRN
  Administered 2022-02-03: 50 mg via INTRAVENOUS

## 2022-02-03 MED ORDER — HEPARIN SODIUM (PORCINE) 10000 UNIT/ML IJ SOLN
INTRAMUSCULAR | Status: AC
  Filled 2022-02-03: qty 1

## 2022-02-03 MED ORDER — CEFAZOLIN SODIUM-DEXTROSE 2-4 GM/100ML-% IV SOLN
2.0000 g | INTRAVENOUS | Status: AC
  Administered 2022-02-03: 2 g via INTRAVENOUS

## 2022-02-03 MED ORDER — FENTANYL CITRATE (PF) 100 MCG/2ML IJ SOLN: 25.0000 ug | INTRAMUSCULAR | Status: DC | PRN

## 2022-02-03 MED ORDER — SODIUM CHLORIDE 0.9 % IV SOLN: INTRAVENOUS | Status: DC

## 2022-02-03 MED ORDER — EPHEDRINE SULFATE (PRESSORS) 50 MG/ML IJ SOLN
INTRAMUSCULAR | Status: DC | PRN
Start: 2022-02-03 — End: 2022-02-03
  Administered 2022-02-03: 10 mg via INTRAVENOUS
  Administered 2022-02-03: 5 mg via INTRAVENOUS
  Administered 2022-02-03: 10 mg via INTRAVENOUS

## 2022-02-03 MED ORDER — ROCURONIUM BROMIDE 10 MG/ML (PF) SYRINGE
PREFILLED_SYRINGE | INTRAVENOUS | Status: AC
  Filled 2022-02-03: qty 10

## 2022-02-03 MED ORDER — ACETAMINOPHEN 500 MG PO TABS
ORAL_TABLET | ORAL | Status: AC
  Administered 2022-02-03: 1000 mg via ORAL
  Filled 2022-02-03: qty 2

## 2022-02-03 MED ORDER — FENTANYL CITRATE (PF) 100 MCG/2ML IJ SOLN
INTRAMUSCULAR | Status: AC
  Filled 2022-02-03: qty 2

## 2022-02-03 MED ORDER — ROCURONIUM BROMIDE 100 MG/10ML IV SOLN
INTRAVENOUS | Status: DC | PRN
  Administered 2022-02-03: 50 mg via INTRAVENOUS

## 2022-02-03 MED ORDER — GABAPENTIN 300 MG PO CAPS
ORAL_CAPSULE | ORAL | Status: AC
  Administered 2022-02-03: 300 mg via ORAL
  Filled 2022-02-03: qty 1

## 2022-02-03 MED ORDER — OXYCODONE HCL 5 MG PO TABS
ORAL_TABLET | ORAL | Status: AC
  Filled 2022-02-03: qty 1

## 2022-02-03 MED ORDER — ONDANSETRON HCL 4 MG/2ML IJ SOLN
INTRAMUSCULAR | Status: DC | PRN
  Administered 2022-02-03: 4 mg via INTRAVENOUS

## 2022-02-03 MED ORDER — DEXTROSE 50 % IV SOLN: 1.0000 | Freq: Once | INTRAVENOUS | Status: AC

## 2022-02-03 MED ORDER — GABAPENTIN 300 MG PO CAPS: 300.0000 mg | ORAL_CAPSULE | ORAL | Status: AC

## 2022-02-03 MED ORDER — PHENYLEPHRINE HCL-NACL 20-0.9 MG/250ML-% IV SOLN
INTRAVENOUS | Status: AC
  Filled 2022-02-03: qty 250

## 2022-02-03 MED ORDER — ACETAMINOPHEN 10 MG/ML IV SOLN: 1000.0000 mg | Freq: Once | INTRAVENOUS | Status: DC | PRN

## 2022-02-03 MED ORDER — PHENYLEPHRINE HCL-NACL 20-0.9 MG/250ML-% IV SOLN
INTRAVENOUS | Status: DC | PRN
Start: 2022-02-03 — End: 2022-02-03
  Administered 2022-02-03: 40 ug/min via INTRAVENOUS

## 2022-02-03 MED ORDER — CHLORHEXIDINE GLUCONATE 0.12 % MT SOLN
OROMUCOSAL | Status: AC
  Administered 2022-02-03: 15 mL via OROMUCOSAL
  Filled 2022-02-03: qty 15

## 2022-02-03 MED ORDER — ORAL CARE MOUTH RINSE: 15.0000 mL | Freq: Once | OROMUCOSAL | Status: AC

## 2022-02-03 MED ORDER — BUPIVACAINE-EPINEPHRINE (PF) 0.25% -1:200000 IJ SOLN
INTRAMUSCULAR | Status: AC
  Filled 2022-02-03: qty 30

## 2022-02-03 MED ORDER — ONDANSETRON HCL 4 MG/2ML IJ SOLN
INTRAMUSCULAR | Status: AC
  Filled 2022-02-03: qty 2

## 2022-02-03 MED ORDER — SODIUM CHLORIDE (PF) 0.9 % IJ SOLN
INTRAMUSCULAR | Status: AC
  Filled 2022-02-03: qty 20

## 2022-02-03 MED ORDER — EPHEDRINE 5 MG/ML INJ
INTRAVENOUS | Status: AC
  Filled 2022-02-03: qty 5

## 2022-02-03 MED ORDER — SODIUM CHLORIDE 0.9 % IV SOLN
INTRAVENOUS | Status: DC | PRN
  Administered 2022-02-03: 1000 mL via INTRAPERITONEAL

## 2022-02-03 SURGICAL SUPPLY — 53 items
ADAPTER CATH DIALYSIS 4X8 IT L (MISCELLANEOUS) ×1 IMPLANT
ADAPTER TITANIUM MEDIONICS (MISCELLANEOUS) ×2 IMPLANT
ADH SKN CLS APL DERMABOND .7 (GAUZE/BANDAGES/DRESSINGS) ×1
ADPR DLYS CATH STRL LF DISP (MISCELLANEOUS) ×1
BIOPATCH WHT 1IN DISK W/4.0 H (GAUZE/BANDAGES/DRESSINGS) ×1 IMPLANT
BLADE CLIPPER SURG (BLADE) IMPLANT
CATH EXTENDED DIALYSIS (CATHETERS) ×2 IMPLANT
DERMABOND ADVANCED (GAUZE/BANDAGES/DRESSINGS) ×1
DERMABOND ADVANCED .7 DNX12 (GAUZE/BANDAGES/DRESSINGS) ×1 IMPLANT
ELECT CAUTERY BLADE 6.4 (BLADE) ×2 IMPLANT
ELECT REM PT RETURN 9FT ADLT (ELECTROSURGICAL) ×2
ELECTRODE REM PT RTRN 9FT ADLT (ELECTROSURGICAL) ×1 IMPLANT
GLOVE SURG ENC MOIS LTX SZ7 (GLOVE) ×6 IMPLANT
GOWN STRL REUS W/ TWL LRG LVL3 (GOWN DISPOSABLE) ×2 IMPLANT
GOWN STRL REUS W/TWL LRG LVL3 (GOWN DISPOSABLE) ×4
GRASPER SUT TROCAR 14GX15 (MISCELLANEOUS) ×2 IMPLANT
IV NS 1000ML (IV SOLUTION) ×2
IV NS 1000ML BAXH (IV SOLUTION) ×1 IMPLANT
KIT TURNOVER KIT A (KITS) ×2 IMPLANT
MANIFOLD NEPTUNE II (INSTRUMENTS) ×2 IMPLANT
MINICAP W/POVIDONE IODINE SOL (MISCELLANEOUS) ×2 IMPLANT
NDL INSUFFLATION 14GA 120MM (NEEDLE) ×1 IMPLANT
NDL SAFETY ECLIPSE 18X1.5 (NEEDLE) ×1 IMPLANT
NEEDLE HYPO 18GX1.5 SHARP (NEEDLE) ×2
NEEDLE HYPO 22GX1.5 SAFETY (NEEDLE) ×2 IMPLANT
NEEDLE INSUFFLATION 14GA 120MM (NEEDLE) ×2 IMPLANT
NS IRRIG 500ML POUR BTL (IV SOLUTION) ×2 IMPLANT
PACK LAP CHOLECYSTECTOMY (MISCELLANEOUS) ×2 IMPLANT
PENCIL ELECTRO HAND CTR (MISCELLANEOUS) ×2 IMPLANT
SET CYSTO W/LG BORE CLAMP LF (SET/KITS/TRAYS/PACK) ×2 IMPLANT
SET TRANSFER 6 W/TWIST CLAMP 5 (SET/KITS/TRAYS/PACK) ×2 IMPLANT
SET TUBE SMOKE EVAC HIGH FLOW (TUBING) ×2 IMPLANT
SLEEVE ADV FIXATION 5X100MM (TROCAR) ×2 IMPLANT
SPONGE DRAIN TRACH 4X4 STRL 2S (GAUZE/BANDAGES/DRESSINGS) ×2 IMPLANT
SPONGE T-LAP 18X18 ~~LOC~~+RFID (SPONGE) ×2 IMPLANT
STYLET FALLER (MISCELLANEOUS) ×1 IMPLANT
STYLET FALLER MEDIONICS (MISCELLANEOUS) IMPLANT
SUT ETHILON 3-0 FS-10 30 BLK (SUTURE) ×2
SUT MNCRL 4-0 (SUTURE) ×2
SUT MNCRL 4-0 27XMFL (SUTURE) ×1
SUT VIC AB 3-0 SH 27 (SUTURE) ×2
SUT VIC AB 3-0 SH 27X BRD (SUTURE) ×1 IMPLANT
SUT VICRYL 0 AB UR-6 (SUTURE) ×4 IMPLANT
SUTURE EHLN 3-0 FS-10 30 BLK (SUTURE) ×1 IMPLANT
SUTURE MNCRL 4-0 27XMF (SUTURE) ×1 IMPLANT
SYR 20ML LL LF (SYRINGE) ×2 IMPLANT
SYR 3ML LL SCALE MARK (SYRINGE) ×2 IMPLANT
SYR TOOMEY IRRIG 70ML (MISCELLANEOUS) ×2
SYRINGE TOOMEY IRRIG 70ML (MISCELLANEOUS) ×1 IMPLANT
SYS KII FIOS ACCESS ABD 5X100 (TROCAR) ×2
SYSTEM KII FIOS ACES ABD 5X100 (TROCAR) ×1 IMPLANT
TROCAR XCEL NON-BLD 5MMX100MML (ENDOMECHANICALS) ×2 IMPLANT
WATER STERILE IRR 500ML POUR (IV SOLUTION) ×2 IMPLANT

## 2022-02-03 NOTE — ED Triage Notes (Addendum)
Pt states she has not been able to void since this am. Pt states she had surgery to put a pd cath in for dialysis this am. Pt with weeping light pink fluid from around PD site. Pt states does have some pressure in bladder area.

## 2022-02-03 NOTE — ED Notes (Signed)
Foley cather in place, area cleaned, drainage bag replaced with leg bag and extensive family and patient teaching performed. Family member able to return demonstrate how to empty and reposition leg bag as needed. Patient able to verbalize when leg bag needs to be emptied, when pericare needs to be performed, and how to empty leg bag.

## 2022-02-03 NOTE — Anesthesia Procedure Notes (Signed)
Procedure Name: Intubation Date/Time: 02/03/2022 7:49 AM Performed by: Debe Coder, CRNA Pre-anesthesia Checklist: Patient identified, Emergency Drugs available, Suction available and Patient being monitored Patient Re-evaluated:Patient Re-evaluated prior to induction Oxygen Delivery Method: Circle system utilized Preoxygenation: Pre-oxygenation with 100% oxygen Induction Type: IV induction Ventilation: Mask ventilation without difficulty Laryngoscope Size: McGraph and 3 Grade View: Grade I Tube type: Oral Tube size: 7.0 mm Number of attempts: 1 Airway Equipment and Method: Stylet and Oral airway Placement Confirmation: ETT inserted through vocal cords under direct vision, positive ETCO2 and breath sounds checked- equal and bilateral Secured at: 21 cm Tube secured with: Tape Dental Injury: Teeth and Oropharynx as per pre-operative assessment

## 2022-02-03 NOTE — Anesthesia Preprocedure Evaluation (Addendum)
Anesthesia Evaluation  Patient identified by MRN, date of birth, ID band Patient awake    Reviewed: Allergy & Precautions, NPO status , Patient's Chart, lab work & pertinent test results  Airway Mallampati: II  TM Distance: >3 FB Neck ROM: full    Dental no notable dental hx. (+) Chipped   Pulmonary shortness of breath and with exertion,  COPD inhaler, former smoker,  Wheezing, states it not COPD. Uses albuterol rarely but has used it a few times this week   Pulmonary exam normal        Cardiovascular Exercise Tolerance: Poor hypertension, (-) angina+ CAD, + CABG (3v CABG 2002 --> LIMA-LAD, SVG-diagonal, SVG-OM), + Peripheral Vascular Disease, +CHF and + DOE  Normal cardiovascular exam+ Valvular Problems/Murmurs MR   MPS 3/22: LVEF= 48%  FINDINGS:  Regional wall motion: reveals normal myocardial thickening and wall  motion.  The overall quality of the study is good.   Artifacts noted: no  Left ventricular cavity: normal   EKG 2022: Sinus bradycardia with 1st degree A-V block Left ventricular hypertrophy with repolarization abnormality ( R in aVL , Cornell product ) Abnormal ECG   Neuro/Psych TIAnegative psych ROS   GI/Hepatic Neg liver ROS, neg GERD  ,  Endo/Other  diabetes, Well Controlled, Type 2, Insulin DependentHypothyroidism   Renal/GU CRFRenal disease     Musculoskeletal  (+) Arthritis ,   Abdominal Normal abdominal exam  (+)   Peds  Hematology  (+) Blood dyscrasia, anemia ,   Anesthesia Other Findings Past Medical History: No date: Anemia of chronic renal failure     Comment:  a.) recieving iron infusions + epoetin alfa-epbx               (Retacrit) No date: Anginal pain (HCC) No date: Aortic atherosclerosis (HCC) No date: Arthritis No date: Atherosclerosis of native arteries of extremity with  intermittent claudication ([place])     Comment:  a.) ABI/TBI 06/24/2021: 0.95 RIGHT, 0.72  LEFT 07/25/2021: Bilateral carotid artery disease (HCC)     Comment:  a.) carotid doppler 07/25/2021: < 17% RICA, < 00% LICA. No date: Carotid atherosclerosis, bilateral No date: CHF (congestive heart failure) ([place])     Comment:  a.) TTE 05/12/2020: LVEF 55-60%; mild LA enlargement;               trivial to mild MR; G1DD. No date: COPD (chronic obstructive pulmonary disease) (HCC) No date: Coronary artery disease     Comment:  a.) 3v CABG 2002 --> LIMA-LAD, SVG-diagonal, SVG-OM No date: ESRD (end stage renal disease) ([place]) No date: GERD (gastroesophageal reflux disease) 09/11/2012: Glomerulonephritis due to antineutrophil cytoplasmic  antibody (ANCA) positive vasculitis     Comment:  a.) smolding phase ANCA associated pauci immune No date: Heart murmur No date: HLD (hyperlipidemia) No date: Hypertension No date: Hypothyroidism No date: Long term current use of immunosuppressive drug     Comment:  a.) hydroxychloriquine for ANCA (+) glomerulonephritis No date: Macular degeneration No date: PAD (peripheral artery disease) ([place]) 11/2021: Pneumonia 2002: S/P CABG x 3     Comment:  a.) LIMA-LAD, SVG-diagnoal, SVG-OM No date: Skin cancer No date: T2DM (type 2 diabetes mellitus) ([place]) No date: TIA (transient ischemic attack)  Past Surgical History: No date: ABDOMINAL HYSTERECTOMY     Comment:  age 37 2014: CATARACT EXTRACTION; Bilateral 2020: CHOLECYSTECTOMY No date: COLONOSCOPY 2002: CORONARY ARTERY BYPASS GRAFT; N/A     Comment:  Procedure: 3v CORONARY ARTERY ARTERY BYPASS GRAFT 07/22/2020: EYE SURGERY; Left  Comment:  Dr. Towanda Octave, Vit and Mem [facility]  BMI    Body Mass Index: 27.10 kg/m      Reproductive/Obstetrics negative OB ROS                           Anesthesia Physical Anesthesia Plan  ASA: 3  Anesthesia Plan: General ETT   Post-op Pain Management: Gabapentin PO (pre-op)* and Tylenol PO (pre-op)*   Induction: Intravenous  PONV Risk  Score and Plan: Ondansetron, Dexamethasone and Treatment may vary due to age or medical condition  Airway Management Planned: Oral ETT  Additional Equipment:   Intra-op Plan:   Post-operative Plan: Extubation in OR  Informed Consent: I have reviewed the patients History and Physical, chart, labs and discussed the procedure including the risks, benefits and alternatives for the proposed anesthesia with the patient or authorized representative who has indicated his/her understanding and acceptance.     Dental advisory given  Plan Discussed with: Anesthesiologist, CRNA and Surgeon  Anesthesia Plan Comments:        Anesthesia Quick Evaluation

## 2022-02-03 NOTE — Discharge Instructions (Signed)
Please call urology to schedule a follow up appointment. Please return for any worsening pain, decreased urine output in the foley, grossly bloody urine or any other new or concerning symptoms.

## 2022-02-03 NOTE — Interval H&P Note (Signed)
History and Physical Interval Note:  02/03/2022 7:23 AM  Alexandria Moody  has presented today for surgery, with the diagnosis of ESRD.  The various methods of treatment have been discussed with the patient and family. After consideration of risks, benefits and other options for treatment, the patient has consented to  Procedure(s) with comments: [place]  (CAPD) CATHETER (N/A) - Provider requesting 1.5 hours / 90 minutes for procedure. as a surgical intervention.  The patient's history has been reviewed, patient examined, no change in status, stable for surgery.  I have reviewed the patient's chart and labs.  Questions were answered to the patient's satisfaction.     [place]

## 2022-02-03 NOTE — Anesthesia Postprocedure Evaluation (Signed)
Anesthesia Post Note  Patient: Alexandria Moody  Procedure(s) Performed: LAPAROSCOPIC INSERTION CONTINUOUS AMBULATORY PERITONEAL DIALYSIS  (CAPD) CATHETER  Patient location during evaluation: PACU Anesthesia Type: General Level of consciousness: awake and alert Pain management: pain level controlled Vital Signs Assessment: post-procedure vital signs reviewed and stable Respiratory status: spontaneous breathing, nonlabored ventilation and respiratory function stable Cardiovascular status: blood pressure returned to baseline and stable Postop Assessment: no apparent nausea or vomiting Anesthetic complications: no   No notable events documented.   Last Vitals:  Vitals:   02/03/22 0947 02/03/22 1043  BP: (!) 101/54 (!) 101/41  Pulse: (!) 50 (!) 45  Resp: 16 16  Temp: 36.8 C (!) 36.2 C  SpO2: 94% 94%    Last Pain:  Vitals:   02/03/22 1043  TempSrc: Temporal  PainSc: [place]

## 2022-02-03 NOTE — Transfer of Care (Signed)
Immediate Anesthesia Transfer of Care Note  Patient: Alexandria Moody  Procedure(s) Performed: LAPAROSCOPIC INSERTION CONTINUOUS AMBULATORY PERITONEAL DIALYSIS  (CAPD) CATHETER  Patient Location: PACU  Anesthesia Type:General  Level of Consciousness: awake and drowsy  Airway & Oxygen Therapy: Patient Spontanous Breathing and Patient connected to face mask oxygen  Post-op Assessment: Report given to RN and Post -op Vital signs reviewed and stable  Post vital signs: Reviewed and stable  Last Vitals:  Vitals Value Taken Time  BP 95/48 02/03/22 0908  Temp    Pulse 48 02/03/22 0914  Resp 17 02/03/22 0914  SpO2 98 % 02/03/22 0914  Vitals shown include unvalidated device data.  Last Pain:  Vitals:   02/03/22 0634  TempSrc: Temporal  PainSc: 0-No pain         Complications: No notable events documented.

## 2022-02-03 NOTE — Discharge Instructions (Addendum)
Peritoneal Dialysis Catheter Placement, Care After The following information offers guidance on how to care for yourself after your procedure. Your health care provider may also give you more specific instructions. If you have problems or questions, contact your health care provider. What can I expect after the procedure? After the procedure, it is common to have some pain or discomfort in your abdomen and your incision area. You may need to wait 2 weeks after your procedure before you can start peritoneal dialysis treatment. If you need dialysis before that time, your health care provider may begin peritoneal dialysis treatment early or offer kidney dialysis treatments (hemodialysis) until you heal. Follow these instructions at home: Incision care Follow instructions from your health care provider about how to take care of your incision or incisions. Make sure you: Wash your hands with soap and water for at least 20 seconds before and after you change your bandage (dressing). If soap and water are not available, use hand sanitizer. Change your dressing only as told by your health care provider. Your health care provider may tell you not to touch or change your dressing. Leave stitches (sutures), staples, skin glue, or adhesive strips in place. These skin closures may need to stay in place for 2 weeks or longer. If adhesive strip edges start to loosen and curl up, you may trim the loose edges. Do not remove adhesive strips completely unless your health care provider tells you to do that. Check your incision areas every day for signs of infection. If you were instructed not to touch or change your dressing, look at your dressing for signs of infection. Check for: Redness, swelling, or more pain. Fluid or blood. Warmth. Pus or a bad smell. Medicines Take over-the-counter and prescription medicines only as told by your health care provider. If you were prescribed an antibiotic medicine, use it as told  by your health care provider. Do not stop using the antibiotic even if you start to feel better. Ask your health care provider if the medicine prescribed to you requires you to avoid driving or using machinery. Driving Do not drive or ride in a car until your health care provider approves. Your seat belt could move the catheter out of position or cause irritation by rubbing on your incision. Activity Rest and limit your activity. Do not lift anything that is heavier than 10 lb (4.5 kg), or the limit that you are told, until your health care provider says that it is safe. Return to your normal activities as told by your health care provider. Ask your health care provider what activities are safe for you. Managing constipation Your condition may cause constipation. To prevent or treat constipation, you may need to: Drink enough fluid to keep your urine pale yellow. Take over-the-counter or prescription medicines. Eat foods that are high in fiber, such as beans, whole grains, and fresh fruits and vegetables. Limit foods that are high in fat and processed sugars, such as fried or sweet foods. General instructions Do not use any products that contain nicotine or tobacco. These products include cigarettes, chewing tobacco, and vaping devices, such as e-cigarettes. If you need help quitting, ask your health care provider. Follow instructions from your health care provider about eating or drinking restrictions. Do not take baths, swim, or use a hot tub until your health care provider approves. Ask your health care provider if you may take showers. You may only be allowed to take sponge baths. Wear loose-fitting clothing that keeps the catheter  covered so that it cannot get caught on something. Keep your catheter clean and dry. Keep all follow-up visits. This is important. Contact a health care provider if: You have a fever or chills. You have warmth, redness, swelling, or more pain around an  incision. You have fluid or blood coming from an incision. You have pus or a bad smell coming from an incision. You cannot eat or drink without vomiting. Get help right away if: You have problems breathing. You are confused. You have trouble speaking. You have severe pain in your abdomen that does not get better with treatment. You have bright red blood in your stool (feces), or your stool is dark black and looks like tar. These symptoms may represent a serious problem that is an emergency. Do not wait to see if the symptoms will go away. Get medical help right away. Call your local emergency services (911 in the [place] Do not drive yourself to the hospital. Summary After the procedure, it is common to have some pain or discomfort in your abdomen, your incision area, or both. You may have to wait 2 weeks after your procedure before you can start peritoneal dialysis treatment. Check your incision area every day for signs of infection. Get medical help right away if you have severe pain in your abdomen that does not get better with treatment. This information is not intended to replace advice given to you by your health care provider. Make sure you discuss any questions you have with your health care provider. Document Revised: 07/22/2020 Document Reviewed: 07/22/2020 Elsevier Patient Education  2022 [employer]   The drugs that you were given will stay in your system until tomorrow so for the next 24 hours you should not:  Drive an automobile Make any legal decisions Drink any alcoholic beverage   You may resume regular meals tomorrow.  Today it is better to start with liquids and gradually work up to solid foods.  You may eat anything you prefer, but it is better to start with liquids, then soup and crackers, and gradually work up to solid foods.   Please notify your doctor immediately if you have any unusual bleeding, trouble  breathing, redness and pain at the surgery site, drainage, fever, or pain not relieved by medication.    Additional Instructions:    Please contact your physician with any problems or Same Day Surgery at [phone], Monday through Friday 6 am to 4 pm, or [place] at [facility] number at [phone].

## 2022-02-03 NOTE — Op Note (Signed)
Laparoscopic placement of peritoneal Dialysis catheter ( Total three cuffs) Laparoscopic Omentopexy   Pre-operative Diagnosis: ESRD, DM   Post-operative Diagnosis: same     Surgeon: Caroleen Hamman, MD FACS   Anesthesia: Gen. with endotracheal tube      Findings: Moderate Left abdominal wall hematoma ( from sub q injection), given this findings I placed the catheter on the right abdominal wall Catheter within pelvis Good return after infusing Peritoneal cavity   Estimated Blood Loss: 5cc              Complications: none     Procedure Details  The patient was seen again in the Holding Room. The benefits, complications, treatment options, and expected outcomes were discussed with the patient. The risks of bleeding, infection, recurrence of symptoms, failure to resolve symptoms, catheter malfunction bowel injury, any of which could require further surgery were reviewed with the patient. The likelihood of improving the patient's symptoms with return to their baseline status is good.  The patient and/or family concurred with the proposed plan, giving informed consent.  The patient was taken to Operating Room, identified and the procedure verified. A Time Out was held and the above information confirmed.   Prior to the induction of general anesthesia, antibiotic prophylaxis was administered. VTE prophylaxis was in place. General endotracheal anesthesia was then administered and tolerated well. After the induction, the abdomen was prepped with Chloraprep and draped in the sterile fashion. The patient was positioned in the supine position. There was a moderate size abd wall hematoma to the left and a small one to the right.   Periumbilical incision created to the Right of the midline.   The anterior rectus fascia identified and incised, rectus muscle identified and retracted laterally, using a port We were able to tunnel into the retro rectus space for about 6 cms. Peritoneum was pierced off the  midline. Pneumoperitoneum was obtained w/o hemodynamic changes. We placed two additional laparoscopic ports under direct visualization.   We were able to thread the catheter via the laparoscopic port within the retrorectus space in the standard fashion.  Under direct visualization we made sure that the coil portion of the catheter layed within the pelvis without any kinks. I was able to also perform a counterincision in the left subcostal area and Lovena Le an additional extension of the catheter . The  Extension had 2 cuffs and I was able to connect it to parse together with the titanium connector in the standard fashion.  There was no evidence of any kinks.  The exit site was to the Right lower quadrant. We instilled heparinized saline a liter into the pelvis and we had very good return. He did have generous omentum, attention was then turned to the omentum and using a PMI device we were able to perform an omentopexy and tacked the omentum to the abdominal wall using 2 interrupted 2-0 Vicryl sutures in the standard fashion. All skin incisions  were infiltrated with a liposomal Marcaine. 4-0 subcuticular Monocryl was used to close the skin. Dermabond was  applied. Sterile dressing applied to the catheter. The patient was then extubated and brought to the recovery room in stable condition. Sponge, lap, and needle counts were correct at closure and at the conclusion of the case.               Caroleen Hamman, MD, FACS

## 2022-02-03 NOTE — ED Provider Notes (Signed)
[facility] Provider Note    Event Date/Time   First MD Initiated Contact with Patient 02/03/22 2221     (approximate)   History   Urinary Retention   HPI  Alexandria Moody is a 76 y.o. female  who, per  surgical note dated earlier today had laparoscopic insertion of peritoneal dialysis catheter, presents to the emergency department because of concern for decreased urination.  The patient states that after getting home from the surgery she noticed that even though she was drinking a fair amount she was not urinating.  She denied any pain in her abdomen.  They did call nurse triage who recommended they present to the emergency department.    Physical Exam   Triage Vital Signs: ED Triage Vitals  Enc Vitals Group     BP 02/03/22 2023 (!) 146/54     Pulse Rate 02/03/22 2023 (!) 49     Resp 02/03/22 2023 16     Temp 02/03/22 2023 97.9 F (36.6 C)     Temp Source 02/03/22 2023 Oral     SpO2 02/03/22 2023 93 %     Weight 02/03/22 2024 153 lb (69.4 kg)     Height 02/03/22 2024 5\' 3"  (1.6 m)     Head Circumference --      Peak Flow --      Pain Score 02/03/22 2024 0     Pain Loc --      Pain Edu? --      Excl. in [place]? --     Most recent vital signs: Vitals:   02/03/22 2023 02/03/22 2151  BP: (!) 146/54 (!) 126/51  Pulse: (!) 49 (!) 48  Resp: 16 17  Temp: 97.9 F (36.6 C) 98.2 F (36.8 C)  SpO2: 93% 94%    General: Awake, no distress.  CV:  Good peripheral perfusion.  Resp:  Normal effort.  Abd:  No distention.  GU:  Foley catheter in place.   ED Results / Procedures / Treatments   Labs (all labs ordered are listed, but only abnormal results are displayed) Labs Reviewed  URINALYSIS, ROUTINE W REFLEX MICROSCOPIC - Abnormal; Notable for the following components:      Result Value   Color, Urine YELLOW (*)    APPearance HAZY (*)    Protein, ur 100 (*)    All other components within normal limits  BASIC METABOLIC PANEL - Abnormal; Notable  for the following components:   CO2 21 (*)    Glucose, Bld 138 (*)    BUN 66 (*)    Creatinine, Ser 3.63 (*)    Calcium 8.3 (*)    GFR, Estimated 13 (*)    All other components within normal limits  CBC - Abnormal; Notable for the following components:   RBC 2.61 (*)    Hemoglobin 8.1 (*)    HCT 26.9 (*)    MCV 103.1 (*)    All other components within normal limits     EKG  None   RADIOLOGY None   PROCEDURES:  Critical Care performed: No  Procedures   MEDICATIONS ORDERED IN ED: Medications - No data to display   IMPRESSION / MDM / [place] / ED COURSE  I reviewed the triage vital signs and the nursing notes.                              Differential diagnosis includes, but  is not limited to, retention secondary to obstruction/infection/medication.  Patient presented to the emergency department today because of concerns for decreased urination.  Patient did have surgical procedure performed earlier today.  Urine without findings concerning for infection.  At this time I do think a likely secondary to anesthetic agent.  I discussed this with the patient and family.  Will plan on discharging home with Foley catheter in place and follow-up with urology.   FINAL CLINICAL IMPRESSION(S) / ED DIAGNOSES   Final diagnoses:  Urinary retention     Note:  This document was prepared using Dragon voice recognition software and may include unintentional dictation errors.    Nance Pear, MD 02/03/22 [id number]

## 2022-02-06 ENCOUNTER — Telehealth: Payer: Self-pay | Admitting: Oncology

## 2022-02-06 NOTE — Telephone Encounter (Signed)
Pt daughter called to cancel all upcoming appts. States her kidney MD is putting her on dialysis. She did not wish to reschedule the appts. Just an fyi.

## 2022-02-09 ENCOUNTER — Ambulatory Visit: Payer: Medicare PPO

## 2022-02-09 ENCOUNTER — Ambulatory Visit: Payer: Medicare PPO | Admitting: Oncology

## 2022-02-09 ENCOUNTER — Inpatient Hospital Stay: Payer: Medicare PPO

## 2022-02-09 ENCOUNTER — Other Ambulatory Visit: Payer: Medicare PPO

## 2022-02-09 ENCOUNTER — Emergency Department
Admission: EM | Admit: 2022-02-09 | Discharge: 2022-02-09 | Disposition: A | Discharge status: Home / Self Care | Payer: Medicare PPO | Attending: Emergency Medicine | Admitting: Emergency Medicine

## 2022-02-09 ENCOUNTER — Other Ambulatory Visit: Payer: Self-pay

## 2022-02-09 DIAGNOSIS — N3289 Other specified disorders of bladder: Secondary | ICD-10-CM | POA: Diagnosis present

## 2022-02-09 DIAGNOSIS — I130 Hypertensive heart and chronic kidney disease with heart failure and stage 1 through stage 4 chronic kidney disease, or unspecified chronic kidney disease: Secondary | ICD-10-CM | POA: Insufficient documentation

## 2022-02-09 DIAGNOSIS — E1122 Type 2 diabetes mellitus with diabetic chronic kidney disease: Secondary | ICD-10-CM | POA: Diagnosis not present

## 2022-02-09 DIAGNOSIS — N184 Chronic kidney disease, stage 4 (severe): Secondary | ICD-10-CM | POA: Diagnosis not present

## 2022-02-09 DIAGNOSIS — E1151 Type 2 diabetes mellitus with diabetic peripheral angiopathy without gangrene: Secondary | ICD-10-CM | POA: Diagnosis not present

## 2022-02-09 DIAGNOSIS — I509 Heart failure, unspecified: Secondary | ICD-10-CM | POA: Diagnosis not present

## 2022-02-09 DIAGNOSIS — E039 Hypothyroidism, unspecified: Secondary | ICD-10-CM | POA: Diagnosis not present

## 2022-02-09 DIAGNOSIS — N390 Urinary tract infection, site not specified: Secondary | ICD-10-CM | POA: Insufficient documentation

## 2022-02-09 DIAGNOSIS — T83511A Infection and inflammatory reaction due to indwelling urethral catheter, initial encounter: Secondary | ICD-10-CM

## 2022-02-09 LAB — URINALYSIS, ROUTINE W REFLEX MICROSCOPIC
Bilirubin Urine: NEGATIVE
Glucose, UA: NEGATIVE mg/dL
Ketones, ur: NEGATIVE mg/dL
Nitrite: NEGATIVE
Protein, ur: 100 mg/dL — AB
Specific Gravity, Urine: 1.01 (ref 1.005–1.030)
WBC, UA: >50 WBC/hpf — ABNORMAL HIGH (ref 0–5)
pH: 5 (ref 5.0–8.0)

## 2022-02-09 MED ORDER — LIDOCAINE HCL (PF) 1 % IJ SOLN
2.1000 mL | Freq: Once | INTRAMUSCULAR | Status: AC
  Administered 2022-02-09: 2.1 mL
  Filled 2022-02-09: qty 5

## 2022-02-09 MED ORDER — CIPROFLOXACIN HCL 500 MG PO TABS: 500.0000 mg | ORAL_TABLET | Freq: Two times a day (BID) | ORAL | 0 refills | Status: AC

## 2022-02-09 MED ORDER — CEFTRIAXONE SODIUM 1 G IJ SOLR
1.0000 g | Freq: Once | INTRAMUSCULAR | Status: AC
  Administered 2022-02-09: 1 g via INTRAMUSCULAR
  Filled 2022-02-09: qty 10

## 2022-02-09 NOTE — ED Triage Notes (Signed)
Patient to ER via POV reports she had a urinary catheter placed approx one week ago after experiencing urinary retention post-op for a PD cath for dialysis. Reports today she was at dialysis and started experiencing bladder spasms. Also reports leaking urine around the catheter. Denies any drainage issues. Reports she was told to have the foley removed in one week post insertion but the earlier appointment she could make is in three weeks.   Patient here for urinary catheter removal.

## 2022-02-09 NOTE — ED Provider Notes (Signed)
[facility] Provider Note    Event Date/Time   First MD Initiated Contact with Patient 02/09/22 1851     (approximate)   History   Chief Complaint Bladder Spasms   HPI Alexandria Moody is a 76 y.o. female, history of diabetes,, CKD stage IV, diastolic CHF, hypertension, PAD, hyperlipidemia, hypothyroidism, TIA, presents to the emergency department for evaluation of bladder spasms.  Patient states that she had a urinary catheter placed approximately 1 week prior after experiencing urinary retention postop for PD cath for dialysis.  She was instructed to have this catheter removed in 1 week after insertion, but scheduled her an appointment for 3 weeks from then.  She presents today to have her Foley catheter removed.  Additionally, when she was at dialysis, she started experiencing bladder spasms as well.  Denies fever/chills, chest pain, shortness of breath, flank pain, back pain, or nausea/vomiting.   History Limitations: No limitations.      Physical Exam  Triage Vital Signs: ED Triage Vitals  Enc Vitals Group     BP 02/09/22 1800 (!) 177/61     Pulse Rate 02/09/22 1759 60     Resp 02/09/22 1759 18     Temp 02/09/22 1759 98.6 F (37 C)     Temp Source 02/09/22 1759 Oral     SpO2 02/09/22 1759 94 %     Weight --      Height 02/09/22 1759 5\' 3"  (1.6 m)     Head Circumference --      Peak Flow --      Pain Score 02/09/22 1759 0     Pain Loc --      Pain Edu? --      Excl. in [place]? --     Most recent vital signs: Vitals:   02/09/22 1759 02/09/22 1800  BP:  (!) 177/61  Pulse: 60   Resp: 18   Temp: 98.6 F (37 C)   SpO2: 94%     General: Awake, NAD.  CV: Good peripheral perfusion.  Resp: Normal effort.  Abd: Soft, non-tender. No distention.  Neuro: At baseline. No gross neurological deficits. Other: No CVA tenderness.  Foley catheter bag filled with urine, no blood, sediments, or cloudiness noted.  Physical Exam    ED Results /  Procedures / Treatments  Labs (all labs ordered are listed, but only abnormal results are displayed) Labs Reviewed  URINALYSIS, ROUTINE W REFLEX MICROSCOPIC     EKG Not applicable   RADIOLOGY  ED Provider Interpretation: Not applicable.  No results found.  PROCEDURES:  Critical Care performed: None.  Procedures    MEDICATIONS ORDERED IN ED: Medications - No data to display   IMPRESSION / MDM / [place] / ED COURSE  I reviewed the triage vital signs and the nursing notes.                              Alexandria Moody is a 76 y.o. female, history of diabetes,, CKD stage IV, diastolic CHF, hypertension, PAD, hyperlipidemia, hypothyroidism, TIA, presents to the emergency department for evaluation of bladder spasms.  Patient states that she had a urinary catheter placed approximately 1 week prior after experiencing urinary retention postop for PD cath for dialysis.  She was instructed to have this catheter removed in 1 week after insertion, but scheduled her an appointment for 3 weeks from then.  She presents today to have her Foley  catheter removed.  Additionally, when she was at dialysis, she started experiencing bladder spasms as well.  Differential diagnosis includes, but is not limited to, urinary tract infection, urinary incontinence, interstitial cystitis, bladder wall injury  ED Course Patient appears well.  Vital signs within normal limits for the patient.  NAD  Pending urinalysis and Foley catheter removal.  Patient care transferred over to [facility], PA-C.  Plan is to await urinalysis results and treat UTI as needed.  If clear, will discharge with oxybutynin.  Patient has follow-up appointment in 2 weeks with urology.       FINAL CLINICAL IMPRESSION(S) / ED DIAGNOSES   Final diagnoses:  None     Rx / DC Orders   ED Discharge Orders     None        Note:  This document was prepared using Dragon voice recognition software and may  include unintentional dictation errors.   Teodoro Spray, [place] 02/09/22 2010    Nena Polio, MD 02/10/22 2221

## 2022-02-09 NOTE — ED Provider Notes (Signed)
----------------------------------------- °  8:46 PM on 02/09/2022 -----------------------------------------  Blood pressure (!) 177/61, pulse 60, temperature 98.6 F (37 C), temperature source Oral, resp. rate 18, height 5\' 3"  (1.6 m), SpO2 94 %.  Assuming care from [facility], [place].  In short, Alexandria Moody is a 76 y.o. female with a chief complaint of Bladder Spasms .  Refer to the original H&P for additional details.  The current plan of care is to await urinalysis.  Patient has had her Foley cath removed.  Patient's urinalysis returns with findings of leukocytes and bacteria.  Given the bladder spasms that she described to suspect that this is an infection.  Nitrite negative but I will add a culture for confirmation.  Patient is started on Rocephin and Cipro outpatient.  I have given them concerning signs and symptoms for the patient to return to the emergency department for worsening infection.  Otherwise continue antibiotics.  Follow-up with primary care as needed..   ED diagnosis:  Catheter associated UTI   Brynda Peon 02/09/22 2054    Harvest Dark, MD 02/09/22 2249

## 2022-02-10 ENCOUNTER — Other Ambulatory Visit: Payer: Medicare PPO

## 2022-02-10 ENCOUNTER — Ambulatory Visit: Payer: Medicare PPO

## 2022-02-10 ENCOUNTER — Inpatient Hospital Stay (HOSPITAL_BASED_OUTPATIENT_CLINIC_OR_DEPARTMENT_OTHER): Payer: Medicare PPO | Admitting: Nurse Practitioner

## 2022-02-10 ENCOUNTER — Telehealth: Payer: Self-pay

## 2022-02-10 ENCOUNTER — Encounter: Payer: Self-pay | Admitting: Nurse Practitioner

## 2022-02-10 ENCOUNTER — Inpatient Hospital Stay: Payer: Medicare PPO

## 2022-02-10 ENCOUNTER — Other Ambulatory Visit: Payer: Self-pay | Admitting: Oncology

## 2022-02-10 VITALS — BP 135/43 | HR 62 | Resp 16 | Ht 63.0 in | Wt 160.0 lb

## 2022-02-10 DIAGNOSIS — D631 Anemia in chronic kidney disease: Secondary | ICD-10-CM | POA: Diagnosis not present

## 2022-02-10 DIAGNOSIS — N189 Chronic kidney disease, unspecified: Secondary | ICD-10-CM

## 2022-02-10 DIAGNOSIS — N184 Chronic kidney disease, stage 4 (severe): Secondary | ICD-10-CM | POA: Diagnosis not present

## 2022-02-10 MED ORDER — EPOETIN ALFA-EPBX 40000 UNIT/ML IJ SOLN: 20000.0000 [IU] | Freq: Once | INTRAMUSCULAR | Status: DC

## 2022-02-10 NOTE — Progress Notes (Signed)
Hematology Progress Note [place] [phone]  Patient Care Team: Bubba Camp, FNP as PCP - General (Family Medicine)  CHIEF COMPLAINTS/PURPOSE OF CONSULTATION: Anemia  HISTORY OF PRESENTING ILLNESS:  Alexandria Moody 76 y.o. female presents for follow-up  PERTINENT HEMATOLOGY HISTORY Patient previously was seen by nurse practitioner Faythe Casa, patient switched care to me on 05/05/21.   PMH  significant for chronic kidney disease stage IV, prior history of ANCA vasculitis, diabetes type 2, hypertension, PVD, hypothyroidism, GERD chronically secondary to chronic kidney disease.  04/05/2021 She evaluated by Dr. Posey Pronto on  for joint pain and found to have a large drop in her hemoglobin.  She had been seen 1 day prior by Dr. Holley Raring with a reported hemoglobin of 10.3.  She just moved to the area from [place].  She has previously received blood transfusions, iron and Epogen.    Patient was seen by NP Faythe Casa on 04/12/2021.  Anemia was felt and the patient was started on replacement therapy.  04/20/2021 Retacrit 10,000 units  04/28/2021 iron panel ferritin 305, iron saturation 24.  Patient received 1 dose of IV Venofer treatment. 05/03/2021, IV Venofer x1 05/05/2021, hemoglobin was 7.4, Retacrit 10,000 units.  05/11/2021, hemoglobin has improved to 8.3.  Hold off blood transfusion and Retacrit today.  Patient reports that appetite is fair.  Weight has been stable. She is not able to tolerate oral iron supplementation due to GI toxicities.  She receives monthly vitamin B12 injections through her PCP  Reports history of EGD/colonoscopy many years ago.  No recent EGD/colonoscopy.  INTERVAL HISTORY [name] is a 76 y.o. female who has above history reviewed by me today presents for follow up visit for management of anemia. She has now started peritoneal dialysis s/p 1 treatment yesterday. She is tired and short of breath.    MEDICAL HISTORY:   Past Medical History:  Diagnosis Date   Anemia of chronic renal failure    a.) recieving iron infusions + epoetin alfa-epbx (Retacrit)   Anginal pain (HCC)    Aortic atherosclerosis (HCC)    Arthritis    Atherosclerosis of native arteries of extremity with intermittent claudication ([place])    a.) ABI/TBI 06/24/2021: 0.95 RIGHT, 0.72 LEFT   Bilateral carotid artery disease ([place]) 07/25/2021   a.) carotid doppler 07/25/2021: < 15% RICA, < 40% LICA.   Carotid atherosclerosis, bilateral    CHF (congestive heart failure) ([place])    a.) TTE 05/12/2020: LVEF 55-60%; mild LA enlargement; trivial to mild MR; G1DD.   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   a.) smolding phase ANCA associated pauci immune   Heart murmur    HLD (hyperlipidemia)    Hypertension    Hypothyroidism    Long term current use of immunosuppressive drug    a.) hydroxychloriquine for ANCA (+) glomerulonephritis   Macular degeneration    PAD (peripheral artery disease) ([place])    Pneumonia 11/2021   S/P CABG x 3 2002   a.) LIMA-LAD, SVG-diagnoal, SVG-OM   Skin cancer    T2DM (type 2 diabetes mellitus) ([place])    TIA (transient ischemic attack)     SURGICAL HISTORY: Past Surgical History:  Procedure Laterality Date   ABDOMINAL HYSTERECTOMY  age 35   CAPD INSERTION N/A 02/03/2022   Procedure: LAPAROSCOPIC INSERTION CONTINUOUS AMBULATORY PERITONEAL DIALYSIS  (CAPD) CATHETER;  Surgeon: Jules Husbands, MD;  Location: [facility] ORS;  Service: General;  Laterality: N/A;  Provider requesting 1.5 hours / 90 minutes for procedure.   CATARACT EXTRACTION Bilateral 2014   CHOLECYSTECTOMY  2020   COLONOSCOPY     CORONARY ARTERY BYPASS GRAFT N/A 2002    Procedure: 3v CORONARY ARTERY ARTERY BYPASS GRAFT   EYE SURGERY Left 07/22/2020   Dr. Towanda Octave, [facility]    SOCIAL HISTORY: Social History   Socioeconomic History   Marital status: Widowed    Spouse name: Not on file   Number of children: Not on file   Years of education: Not on file   Highest education level: Not on file  Occupational History   Not on file  Tobacco Use   Smoking status: Former    Packs/day: 2.00    Years: 25.00    Pack years: 50.00    Types: Cigarettes    Quit date: 2000    Years since quitting: 23.1   Smokeless tobacco: Never  Vaping Use   Vaping Use: Never used  Substance and Sexual Activity   Alcohol use: Never   Drug use: Never   Sexual activity: Not on file  Other Topics Concern   Not on file  Social History Narrative   Not on file   Social Determinants of Health   Financial Resource Strain: Not on file  Food Insecurity: Not on file  Transportation Needs: Not on file  Physical Activity: Not on file  Stress: Not on file  Social Connections: Not on file  Intimate Partner Violence: Not on file    FAMILY HISTORY: History reviewed. No pertinent family history.  ALLERGIES:  is allergic to tape.  MEDICATIONS:  Current Outpatient Medications  Medication Sig Dispense Refill   albuterol (VENTOLIN HFA) 108 (90 Base) MCG/ACT inhaler Inhale 2 puffs into the lungs every 6 (six) hours as needed for wheezing or shortness of breath.     amLODipine (NORVASC) 5 MG tablet Take 5 mg by mouth 2 (two) times daily.     aspirin 81 MG EC tablet Take 81 mg by mouth daily.     calcitRIOL (ROCALTROL) 0.25 MCG capsule Take 0.25 mcg by mouth daily.     Cholecalciferol 125 MCG (5000 UT) capsule Take 5,000 Units by mouth daily.     ciprofloxacin (CIPRO) 500 MG tablet Take 1 tablet (500 mg total) by mouth 2 (two) times daily for 7 days. 14 tablet 0   citalopram (CELEXA) 20 MG tablet Take 20 mg by mouth every morning.     fluocinonide (LIDEX) 0.05 % external  solution Apply 1 application topically 2 (two) times daily as needed (scalp irritation).     gabapentin (NEURONTIN) 100 MG capsule Take 200 mg by mouth at bedtime.     hydrALAZINE (APRESOLINE) 50 MG tablet Take 50 mg by mouth in the morning and at bedtime.     hydroxychloroquine (PLAQUENIL) 200 MG tablet Take 200 mg by mouth daily.     insulin glargine, 1 Unit Dial, (TOUJEO SOLOSTAR) 300 UNIT/ML Solostar Pen Inject 32 Units into the skin every morning.     Insulin Pen Needle (BD PEN NEEDLE NANO 2ND GEN) 32G X 4 MM MISC daily.     isosorbide mononitrate (IMDUR) 120 MG 24 hr tablet Take 120 mg by mouth at bedtime.     levothyroxine (SYNTHROID) 75  MCG tablet Take 75 mcg by mouth daily before breakfast.     linagliptin (TRADJENTA) 5 MG TABS tablet Take 5 mg by mouth every morning.     losartan (COZAAR) 25 MG tablet Take 25 mg by mouth every morning.     Omega-3 Fatty Acids (FISH OIL) 1000 MG CAPS Take 1,000 mg by mouth in the morning and at bedtime.     omeprazole (PRILOSEC) 20 MG capsule Take 20 mg by mouth every morning.     ranolazine (RANEXA) 500 MG 12 hr tablet Take 500 mg by mouth 2 (two) times daily.     rosuvastatin (CRESTOR) 20 MG tablet Take 20 mg by mouth every morning.     HYDROcodone-acetaminophen (NORCO/VICODIN) 5-325 MG tablet Take 1-2 tablets by mouth every 6 (six) hours as needed for moderate pain. (Patient not taking: Reported on 02/10/2022) 20 tablet 0   ipratropium (ATROVENT) 0.06 % nasal spray Place 2 sprays into both nostrils 3 (three) times daily as needed for rhinitis. (Patient not taking: Reported on 02/10/2022)     nitroGLYCERIN (NITROSTAT) 0.4 MG SL tablet Place 0.4 mg under the tongue every 5 (five) minutes as needed for chest pain. (Patient not taking: Reported on 02/10/2022)     patiromer (VELTASSA) 8.4 g packet Take 8.4 g by mouth every morning. Takes with applesauce (Patient not taking: Reported on 02/10/2022)     torsemide (DEMADEX) 20 MG tablet Take 20 mg by mouth every  morning.     No current facility-administered medications for this visit.    REVIEW OF SYSTEMS:   Review of Systems  Constitutional:  Negative for chills, fever, malaise/fatigue and weight loss.  HENT:  Negative for congestion, ear pain, nosebleeds and tinnitus.   Eyes: Negative.  Negative for blurred vision and double vision.  Respiratory:  Negative for cough, sputum production and shortness of breath.   Cardiovascular: Negative.  Negative for chest pain, palpitations and leg swelling.  Gastrointestinal: Negative.  Negative for abdominal pain, constipation, diarrhea, nausea and vomiting.  Genitourinary:  Negative for dysuria, frequency and urgency.  Musculoskeletal:  Negative for back pain and falls.  Skin: Negative.  Negative for rash.  Neurological:  Negative for dizziness, weakness and headaches.  Endo/Heme/Allergies: Negative.  Does not bruise/bleed easily.  Psychiatric/Behavioral:  Negative for depression. The patient is not nervous/anxious and does not have insomnia.    PHYSICAL EXAMINATION: ECOG PERFORMANCE STATUS: 1 - Symptomatic but completely ambulatory  Vitals:   02/10/22 1441  BP: (!) 135/43  Pulse: 62  Resp: 16  SpO2: 98%   Filed Weights   02/10/22 1441  Weight: 160 lb (72.6 kg)   Physical Exam Constitutional:      Comments: Frail appearance, thin built, ambulates independently  HENT:     Head: Normocephalic and atraumatic.  Eyes:     Pupils: Pupils are equal, round, and reactive to light.  Cardiovascular:     Rate and Rhythm: Normal rate and regular rhythm.     Heart sounds: Murmur heard.  Pulmonary:     Effort: Pulmonary effort is normal.     Breath sounds: Wheezing present.  Abdominal:     General: Bowel sounds are normal. There is no distension.     Palpations: Abdomen is soft.     Tenderness: There is no abdominal tenderness.  Musculoskeletal:        General: Normal range of motion.     Cervical back: Normal range of motion.  Skin:    General:  Skin is warm  and dry.     Findings: No rash.  Neurological:     Mental Status: She is alert and oriented to person, place, and time.  Psychiatric:        Mood and Affect: Mood normal.    LABORATORY DATA:  I have reviewed the data as listed CBC    Component Value Date/Time   WBC 8.0 02/03/2022 2040   RBC 2.61 (L) 02/03/2022 2040   HGB 8.1 (L) 02/03/2022 2040   HCT 26.9 (L) 02/03/2022 2040   PLT 270 02/03/2022 2040   MCV 103.1 (H) 02/03/2022 2040   MCH 31.0 02/03/2022 2040   MCHC 30.1 02/03/2022 2040   RDW 15.1 02/03/2022 2040   LYMPHSABS 0.8 01/10/2022 1318   MONOABS 0.4 01/10/2022 1318   EOSABS 0.3 01/10/2022 1318   BASOSABS 0.0 01/10/2022 1318   CMP Latest Ref Rng & Units 02/03/2022 02/03/2022 11/14/2021  Glucose 70 - 99 mg/dL 138(H) 47(L) 208(H)  BUN 8 - 23 mg/dL 66(H) 62(H) 49(H)  Creatinine 0.44 - 1.00 mg/dL 3.63(H) 3.30(H) 2.44(H)  Sodium 135 - 145 mmol/L 135 142 134(L)  Potassium 3.5 - 5.1 mmol/L 4.7 4.5 4.6  Chloride 98 - 111 mmol/L 105 112(H) 110  CO2 22 - 32 mmol/L 21(L) - 16(L)  Calcium 8.9 - 10.3 mg/dL 8.3(L) - 8.1(L)  Total Protein 6.5 - 8.1 g/dL - - -  Total Bilirubin 0.3 - 1.2 mg/dL - - -  Alkaline Phos 38 - 126 U/L - - -  AST 15 - 41 U/L - - -  ALT 0 - 44 U/L - - -    RADIOGRAPHIC STUDIES: I have personally reviewed the radiological images as listed and agreed with the findings in the report. DG Chest 2 View  Result Date: 11/14/2021 CLINICAL DATA:  Near syncope. EXAM: CHEST - 2 VIEW COMPARISON:  None. FINDINGS: Multiple sternal wires and vascular clips are seen. Mild to moderate severity diffusely increased interstitial markings are noted. Very mild atelectasis is seen within the lateral aspect of the bilateral lung bases. There is no evidence of a pleural effusion or pneumothorax. The heart size and mediastinal contours are within normal limits. Moderate to marked severity calcification of the aortic arch is noted. The visualized skeletal structures are  unremarkable. IMPRESSION: 1. Evidence of prior median sternotomy/CABG. 2. Additional findings suggestive of mild to moderate severity diffuse interstitial edema. Underlying chronic interstitial lung disease cannot be excluded. Electronically Signed   By: Virgina Norfolk M.D.   On: 11/14/2021 20:03   CT HEAD WO CONTRAST (5MM)  Result Date: 11/14/2021 CLINICAL DATA:  Altered mental status.  Neurologic deficit. EXAM: CT HEAD WITHOUT CONTRAST TECHNIQUE: Contiguous axial images were obtained from the base of the skull through the vertex without intravenous contrast. COMPARISON:  None. FINDINGS: Brain: No acute intracranial hemorrhage. No focal mass lesion. No CT evidence of acute infarction. No midline shift or mass effect. No hydrocephalus. Basilar cisterns are patent. There are mild periventricular and subcortical white matter hypodensities. Generalized cortical atrophy. Vascular: No hyperdense vessel or unexpected calcification. Skull: Normal. Negative for fracture or focal lesion. Sinuses/Orbits: Paranasal sinuses and mastoid air cells are clear. Orbits are clear. Other: None. IMPRESSION: 1. No acute intracranial findings. 2. Mild subcortical white matter microvascular disease. Electronically Signed   By: Suzy Bouchard M.D.   On: 11/14/2021 21:23   Intravitreal Injection, Pharmacologic Agent - OS - Left Eye  Result Date: 12/27/2021 Time Out 12/27/2021. 11:06 AM. Confirmed correct patient, procedure, site, and patient consented. Anesthesia  Topical anesthesia was used. Anesthetic medications included Lidocaine 4%. Procedure Preparation included 10% betadine to eyelids, 5% betadine to ocular surface, Ofloxacin . A 30 gauge needle was used. Injection: 2.5 mg bevacizumab 2.5 MG/0.1ML   Route: Intravitreal, Site: Left Eye   NDC: [phone], Lot: [id number] Post-op Post injection exam found visual acuity of at least counting fingers. The patient tolerated the procedure well. There were no complications. The  patient received written and verbal post procedure care education. Post injection medications included ocuflox.   Intravitreal Injection, Pharmacologic Agent - OS - Left Eye  Result Date: 11/15/2021 Time Out 11/15/2021. 11:34 AM. Confirmed correct patient, procedure, site, and patient consented. Anesthesia Topical anesthesia was used. Anesthetic medications included Lidocaine 4%. Procedure Preparation included 10% betadine to eyelids, 5% betadine to ocular surface, Ofloxacin . A 30 gauge needle was used. Injection: 2.5 mg bevacizumab 2.5 MG/0.1ML   Route: Intravitreal, Site: Left Eye   NDC: [phone], Lot: [id number] Post-op Post injection exam found visual acuity of at least counting fingers. The patient tolerated the procedure well. There were no complications. The patient received written and verbal post procedure care education. Post injection medications included ocuflox.   OCT, Retina - OU - Both Eyes  Result Date: 12/27/2021 Right Eye Quality was good. Scan locations included subfoveal. Central Foveal Thickness: 314. Progression has improved. Findings include abnormal foveal contour. Left Eye Quality was good. Scan locations included subfoveal. Central Foveal Thickness: 317. Progression has improved. Findings include cystoid macular edema, abnormal foveal contour. Notes OD with minor intraretinal fluid, status post vitrectomy membrane peel  much less thickening, much less outer retinal disturbance as compared to epiretinal membrane present November 2021 OS with perifoveal thickening with intraretinal fluid and extension to the outer retina inferonasal to FAZ consistent with a rap like lesion when correlated with clinical findings on macular evaluation, today with much less CME and intraretinal fluid post recent Avastin.  Vastly improved as compared to onset October 2022 will need repeat injection Avastin today and examination again in 7 weeks  OCT, Retina - OU - Both Eyes  Result Date:  11/15/2021 Right Eye Quality was good. Scan locations included subfoveal. Central Foveal Thickness: 311. Progression has improved. Findings include abnormal foveal contour. Left Eye Quality was good. Scan locations included subfoveal. Central Foveal Thickness: 304. Progression has improved. Findings include cystoid macular edema, abnormal foveal contour. Notes OD with minor intraretinal fluid, status post vitrectomy membrane peel  much less thickening, much less outer retinal disturbance as compared to epiretinal membrane present November 2021 OS with perifoveal thickening with intraretinal fluid and extension to the outer retina inferonasal to FAZ consistent with a rap like lesion when correlated with clinical findings on macular evaluation, today with much less CME and intraretinal fluid post recent Avastin.  Will need repeat injection Avastin today and examination again in 6 weeks   ASSESSMENT & PLAN:   No diagnosis found.  #Anemia in chronic kidney disease, stage 4. eGFR was 16 at nephrology on 02/08/22. She has now started peritoneal dialysis. Plan for Monday-Wednesday-Friday dialysis at this point and will eventually transition to home dialysis. Reviewed that management of her anemia will now be managed by Dr. Holley Raring and nephrology department. She does not require follow up with hematology at this time.   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  Continue follow-up with rheumatology.  #CKD Stage IV- managed by nephrology  Per patient, patient nephrology will take over erythropoietin replacement after she starts dialysis.  We will continue the treatment until  then.  No additional follow up at this time. Follow up with Nephrology.   Verlon Au, NP 02/10/22

## 2022-02-10 NOTE — Telephone Encounter (Signed)
Per Lauren: patient is getting peritoneal dialysis. Sounds like she was told by Dr. Holley Raring that they would take over retacrit/iron/etc. but someone else int he clinic told them that they wouldn't and she needed follow up. Patient was understandably confused. I held retacrit today and have requested note from Dr. Holley Raring   Dr. Tasia Catchings: thanks. please follow up and make sure she gets retacrit either here or with Dr.Lateef. cancel her appts here if Dr.Lateef will take over. thanks.

## 2022-02-10 NOTE — Progress Notes (Signed)
Received Dialysis for the first time yesterday.

## 2022-02-12 LAB — URINE CULTURE: Culture: >=100000 — AB

## 2022-02-13 ENCOUNTER — Telehealth: Payer: Self-pay | Admitting: Emergency Medicine

## 2022-02-13 NOTE — Telephone Encounter (Signed)
Requested last office note from Dr. Holley Raring be faxed to the [facility] per Ander Purpura, NP.

## 2022-02-15 ENCOUNTER — Telehealth: Payer: Self-pay

## 2022-02-15 ENCOUNTER — Other Ambulatory Visit: Payer: Self-pay

## 2022-02-15 ENCOUNTER — Encounter (INDEPENDENT_AMBULATORY_CARE_PROVIDER_SITE_OTHER): Payer: Medicare PPO | Admitting: Ophthalmology

## 2022-02-15 DIAGNOSIS — D631 Anemia in chronic kidney disease: Secondary | ICD-10-CM

## 2022-02-15 DIAGNOSIS — N189 Chronic kidney disease, unspecified: Secondary | ICD-10-CM

## 2022-02-15 NOTE — Telephone Encounter (Signed)
As requested by NP, called & explained to daughter that Dr.Lateef and Lauren, NP are under agreement, patient is advised to come to the cancer center for now until she's released to do PD at home. Informed on future lab and retacrit injection apt on 3/3. Daughter agrees and verbalizes understanding.  ?

## 2022-02-16 ENCOUNTER — Other Ambulatory Visit: Payer: Self-pay

## 2022-02-16 ENCOUNTER — Other Ambulatory Visit: Payer: Medicare PPO

## 2022-02-16 ENCOUNTER — Encounter (INDEPENDENT_AMBULATORY_CARE_PROVIDER_SITE_OTHER): Payer: Self-pay | Admitting: Ophthalmology

## 2022-02-16 ENCOUNTER — Ambulatory Visit: Payer: Medicare PPO

## 2022-02-16 ENCOUNTER — Ambulatory Visit (INDEPENDENT_AMBULATORY_CARE_PROVIDER_SITE_OTHER): Payer: Medicare PPO | Admitting: Ophthalmology

## 2022-02-16 DIAGNOSIS — E113412 Type 2 diabetes mellitus with severe nonproliferative diabetic retinopathy with macular edema, left eye: Secondary | ICD-10-CM | POA: Diagnosis not present

## 2022-02-16 DIAGNOSIS — H353221 Exudative age-related macular degeneration, left eye, with active choroidal neovascularization: Secondary | ICD-10-CM

## 2022-02-16 MED ORDER — BEVACIZUMAB 2.5 MG/0.1ML IZ SOSY
2.5000 mg | PREFILLED_SYRINGE | INTRAVITREAL | Status: AC | PRN
  Administered 2022-02-16: 2.5 mg via INTRAVITREAL

## 2022-02-16 NOTE — Progress Notes (Signed)
02/16/2022     CHIEF COMPLAINT Patient presents for  Chief Complaint  Patient presents with   Macular Degeneration      HISTORY OF PRESENT ILLNESS: Alexandria Moody is a 76 y.o. 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  As an aside patient has recently, 3 episodes, started dialysis , peritoneal dialysis Last edited by Hurman Horn, MD on 02/16/2022 10:42 AM.      Referring physician: Bubba Camp, FNP [address] [place],  [place] [address]  HISTORICAL INFORMATION:   Selected notes from the [place]: No current outpatient medications on file. (Ophthalmic Drugs)   No current facility-administered medications for this visit. (Ophthalmic Drugs)   Current Outpatient Medications (Other)  Medication Sig   albuterol (VENTOLIN HFA) 108 (90 Base) MCG/ACT inhaler Inhale 2 puffs into the lungs every 6 (six) hours as needed for wheezing or shortness of breath.   amLODipine (NORVASC) 5 MG tablet Take 5 mg by mouth 2 (two) times daily.   aspirin 81 MG EC tablet Take 81 mg by mouth daily.   calcitRIOL (ROCALTROL) 0.25 MCG capsule Take 0.25 mcg by mouth daily.   Cholecalciferol 125 MCG (5000 UT) capsule Take 5,000 Units by mouth daily.   ciprofloxacin (CIPRO) 500 MG tablet Take 1 tablet (500 mg total) by mouth 2 (two) times daily for 7 days.   citalopram (CELEXA) 20 MG tablet Take 20 mg by mouth every morning.   fluocinonide (LIDEX) 0.05 % external solution Apply 1 application topically 2 (two) times daily as needed (scalp irritation).   gabapentin (NEURONTIN) 100 MG capsule Take 200 mg by mouth at bedtime.   hydrALAZINE (APRESOLINE) 50 MG tablet Take 50 mg by mouth in the morning and at bedtime.   HYDROcodone-acetaminophen (NORCO/VICODIN) 5-325 MG tablet Take 1-2 tablets by mouth every 6 (six) hours as needed for moderate pain. (Patient not taking: Reported on  02/10/2022)   hydroxychloroquine (PLAQUENIL) 200 MG tablet Take 200 mg by mouth daily.   insulin glargine, 1 Unit Dial, (TOUJEO SOLOSTAR) 300 UNIT/ML Solostar Pen Inject 32 Units into the skin every morning.   Insulin Pen Needle (BD PEN NEEDLE NANO 2ND GEN) 32G X 4 MM MISC daily.   ipratropium (ATROVENT) 0.06 % nasal spray Place 2 sprays into both nostrils 3 (three) times daily as needed for rhinitis. (Patient not taking: Reported on 02/10/2022)   isosorbide mononitrate (IMDUR) 120 MG 24 hr tablet Take 120 mg by mouth at bedtime.   levothyroxine (SYNTHROID) 75 MCG tablet Take 75 mcg by mouth daily before breakfast.   linagliptin (TRADJENTA) 5 MG TABS tablet Take 5 mg by mouth every morning.   losartan (COZAAR) 25 MG tablet Take 25 mg by mouth every morning.   nitroGLYCERIN (NITROSTAT) 0.4 MG SL tablet Place 0.4 mg under the tongue every 5 (five) minutes as needed for chest pain. (Patient not taking: Reported on 02/10/2022)   Omega-3 Fatty Acids (FISH OIL) 1000 MG CAPS Take 1,000 mg by mouth in the morning and at bedtime.   omeprazole (PRILOSEC) 20 MG capsule Take 20 mg by mouth every morning.   patiromer (VELTASSA) 8.4 g packet Take 8.4 g by mouth every morning. Takes with applesauce (Patient not taking: Reported on 02/10/2022)   ranolazine (RANEXA) 500 MG 12 hr tablet Take 500 mg by mouth 2 (two) times daily.   rosuvastatin (  CRESTOR) 20 MG tablet Take 20 mg by mouth every morning.   torsemide (DEMADEX) 20 MG tablet Take 20 mg by mouth every morning.   No current facility-administered medications for this visit. (Other)      REVIEW OF SYSTEMS: ROS   Negative for: Constitutional, Gastrointestinal, Neurological, Skin, Genitourinary, Musculoskeletal, HENT, Endocrine, Cardiovascular, Eyes, Respiratory, Psychiatric, Allergic/Imm, Heme/Lymph Last edited by Hurman Horn, MD on 02/16/2022 10:37 AM.       ALLERGIES Allergies  Allergen Reactions   Tape     Tears skin    PAST MEDICAL  HISTORY Past Medical History:  Diagnosis Date   Anemia of chronic renal failure    a.) recieving iron infusions + epoetin alfa-epbx (Retacrit)   Anginal pain (HCC)    Aortic atherosclerosis (HCC)    Arthritis    Atherosclerosis of native arteries of extremity with intermittent claudication ([place])    a.) ABI/TBI 06/24/2021: 0.95 RIGHT, 0.72 LEFT   Bilateral carotid artery disease ([place]) 07/25/2021   a.) carotid doppler 07/25/2021: < 34% RICA, < 19% LICA.   Carotid atherosclerosis, bilateral    CHF (congestive heart failure) ([place])    a.) TTE 05/12/2020: LVEF 55-60%; mild LA enlargement; trivial to mild MR; G1DD.   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   a.) smolding phase ANCA associated pauci immune   Heart murmur    HLD (hyperlipidemia)    Hypertension    Hypothyroidism    Long term current use of immunosuppressive drug    a.) hydroxychloriquine for ANCA (+) glomerulonephritis   Macular degeneration    PAD (peripheral artery disease) ([place])    Pneumonia 11/2021   S/P CABG x 3 2002   a.) LIMA-LAD, SVG-diagnoal, SVG-OM   Skin cancer    T2DM (type 2 diabetes mellitus) ([place])    TIA (transient ischemic attack)    Past Surgical History:  Procedure Laterality Date   ABDOMINAL HYSTERECTOMY     age 29   CAPD INSERTION N/A 02/03/2022   Procedure: LAPAROSCOPIC INSERTION CONTINUOUS AMBULATORY PERITONEAL DIALYSIS  (CAPD) CATHETER;  Surgeon: Jules Husbands, MD;  Location: [facility] ORS;  Service: General;  Laterality: N/A;  Provider requesting 1.5 hours / 90 minutes for procedure.   CATARACT EXTRACTION Bilateral 2014   CHOLECYSTECTOMY  2020   COLONOSCOPY     CORONARY ARTERY BYPASS GRAFT N/A 2002   Procedure: 3v  CORONARY ARTERY ARTERY BYPASS GRAFT   EYE SURGERY Left 07/22/2020   Dr. Towanda Octave, Vit and Mem [facility]    FAMILY HISTORY History reviewed. No pertinent family history.  SOCIAL HISTORY Social History   Tobacco Use   Smoking status: Former    Packs/day: 2.00    Years: 25.00    Pack years: 50.00    Types: Cigarettes    Quit date: 2000    Years since quitting: 23.1   Smokeless tobacco: Never  Vaping Use   Vaping Use: Never used  Substance Use Topics   Alcohol use: Never   Drug use: Never         OPHTHALMIC EXAM:  Base Eye Exam     Visual Acuity (ETDRS)       Right Left   Dist cc 20/30 20/60   Dist ph cc NI 20/40  Correction: Glasses         Tonometry (Tonopen, 10:41 AM)       Right Left   Pressure 12 13         Pupils       Pupils APD   Right PERRL None   Left PERRL None         Neuro/Psych     Oriented x3: Yes   Mood/Affect: Normal         Dilation     Left eye: 1.0% Mydriacyl, 2.5% Phenylephrine @ 10:41 AM           Slit Lamp and Fundus Exam     External Exam       Right Left   External Normal Normal         Slit Lamp Exam       Right Left   Lids/Lashes Normal Normal   Conjunctiva/Sclera White and quiet White and quiet   Cornea Clear Clear   Anterior Chamber Deep and quiet Deep and quiet   Iris Round and reactive Round and reactive   Lens Centered posterior chamber intraocular lens Centered posterior chamber intraocular lens   Anterior Vitreous Normal Normal         Fundus Exam       Right Left   Posterior Vitreous  Clear vitrectomized   Disc  Normal   C/D Ratio  0.15   Macula  Microaneurysms, no macular thickening, no exudates, no clinically significant macular edema   Vessels  NPDR- Moderate   Periphery  Normal            IMAGING AND PROCEDURES  Imaging and Procedures for 02/16/22  OCT, Retina - OU - Both Eyes       Right Eye Quality was good. Scan locations included subfoveal. Central Foveal  Thickness: 325. Progression has improved. Findings include abnormal foveal contour.   Left Eye Quality was good. Scan locations included subfoveal. Central Foveal Thickness: 303. Progression has improved. Findings include cystoid macular edema, abnormal foveal contour.   Notes OD with minor intraretinal fluid, status post vitrectomy membrane peel  much less thickening, much less outer retinal disturbance as compared to epiretinal membrane present November 2021  OS with perifoveal thickening with intraretinal fluid and extension to the outer retina inferonasal to FAZ consistent with a rap like lesion when correlated with clinical findings on macular evaluation, today with much less CME and intraretinal fluid post recent Avastin.  Vastly improved as compared to onset October 2022 will need repeat injection Avastin today and examination again in 7 weeks, with less thickening OS TODAY     Intravitreal Injection, Pharmacologic Agent - OS - Left Eye       Time Out 02/16/2022. 10:45 AM. Confirmed correct patient, procedure, site, and patient consented.   Anesthesia Topical anesthesia was used. Anesthetic medications included Lidocaine 4%.   Procedure Preparation included 10% betadine to eyelids, 5% betadine to ocular surface, Ofloxacin . A 30 gauge needle was used.   Injection: 2.5 mg bevacizumab 2.5 MG/0.1ML   Route: Intravitreal, Site: Left Eye   NDC: [phone], Lot: [id number]   Post-op Post injection exam found visual acuity of at least counting fingers. The patient tolerated the procedure well. There were no complications. The patient received written and verbal post procedure care education. Post injection medications included ocuflox.              ASSESSMENT/PLAN:  Severe nonproliferative diabetic retinopathy of left eye, with macular edema, associated  with type 2 diabetes mellitus (HCC) Perifoveal edema appears to be more related to a wrap like lesion of CNVM and not  CSME  Exudative age-related macular degeneration of left eye with active choroidal neovascularization (HCC) Smaller perifoveal lesion today on Avastin current interval of examination and therapy at 7 weeks and 2 days, maintain this interval until this condition is improving further     ICD-10-CM   1. Exudative age-related macular degeneration of left eye with active choroidal neovascularization (HCC)  H35.3221 OCT, Retina - OU - Both Eyes    Intravitreal Injection, Pharmacologic Agent - OS - Left Eye    bevacizumab (AVASTIN) SOSY 2.5 mg    2. Severe nonproliferative diabetic retinopathy of left eye, with macular edema, associated with type 2 diabetes mellitus ([place])  T06.2694       1.  OS perifoveal thickening and edema improving on antivegF maintained at 7-week and slightly improved.  Repeat injection today and maintain 7-week follow-up interval and adjust appropriate to the response  2.  3.  Ophthalmic Meds Ordered this visit:  Meds ordered this encounter  Medications   bevacizumab (AVASTIN) SOSY 2.5 mg       Return in about 7 weeks (around 04/06/2022) for DILATE OU, AVASTIN OCT, OS.  There are no Patient Instructions on file for this visit.   Explained the diagnoses, plan, and follow up with the patient and they expressed understanding.  Patient expressed understanding of the importance of proper follow up care.   Clent Demark Shann Lewellyn M.D. Diseases & Surgery of the Retina and Vitreous [facility] [place] 02/16/22     Abbreviations: M myopia (nearsighted); A astigmatism; H hyperopia (farsighted); P presbyopia; Mrx spectacle prescription;  CTL contact lenses; OD right eye; OS left eye; OU both eyes  XT exotropia; ET esotropia; PEK punctate epithelial keratitis; PEE punctate epithelial erosions; DES dry eye syndrome; MGD meibomian gland dysfunction; ATs artificial tears; PFAT's preservative free artificial tears; [place] nuclear sclerotic cataract; PSC posterior subcapsular  cataract; ERM epi-retinal membrane; PVD posterior vitreous detachment; RD retinal detachment; DM diabetes mellitus; DR diabetic retinopathy; NPDR non-proliferative diabetic retinopathy; PDR proliferative diabetic retinopathy; CSME clinically significant macular edema; DME diabetic macular edema; dbh dot blot hemorrhages; CWS cotton wool spot; POAG primary open angle glaucoma; C/D cup-to-disc ratio; HVF humphrey visual field; GVF goldmann visual field; OCT optical coherence tomography; IOP intraocular pressure; BRVO Branch retinal vein occlusion; CRVO central retinal vein occlusion; CRAO central retinal artery occlusion; BRAO branch retinal artery occlusion; RT retinal tear; SB scleral buckle; PPV pars plana vitrectomy; VH Vitreous hemorrhage; PRP panretinal laser photocoagulation; IVK intravitreal kenalog; VMT vitreomacular traction; MH Macular hole;  NVD neovascularization of the disc; NVE neovascularization elsewhere; AREDS age related eye disease study; ARMD age related macular degeneration; POAG primary open angle glaucoma; EBMD epithelial/anterior basement membrane dystrophy; ACIOL anterior chamber intraocular lens; IOL intraocular lens; PCIOL posterior chamber intraocular lens; Phaco/IOL phacoemulsification with intraocular lens placement; [place] photorefractive keratectomy; LASIK laser assisted in situ keratomileusis; HTN hypertension; DM diabetes mellitus; COPD chronic obstructive pulmonary disease

## 2022-02-16 NOTE — Assessment & Plan Note (Signed)
Perifoveal edema appears to be more related to a wrap like lesion of CNVM and not CSME ?

## 2022-02-16 NOTE — Assessment & Plan Note (Signed)
Smaller perifoveal lesion today on Avastin current interval of examination and therapy at 7 weeks and 2 days, maintain this interval until this condition is improving further ?

## 2022-02-17 ENCOUNTER — Inpatient Hospital Stay: Payer: Medicare PPO | Attending: Oncology

## 2022-02-17 ENCOUNTER — Inpatient Hospital Stay: Payer: Medicare PPO

## 2022-02-17 VITALS — BP 154/64 | HR 58

## 2022-02-17 DIAGNOSIS — D631 Anemia in chronic kidney disease: Secondary | ICD-10-CM | POA: Insufficient documentation

## 2022-02-17 DIAGNOSIS — N189 Chronic kidney disease, unspecified: Secondary | ICD-10-CM

## 2022-02-17 DIAGNOSIS — N184 Chronic kidney disease, stage 4 (severe): Secondary | ICD-10-CM | POA: Insufficient documentation

## 2022-02-17 LAB — CBC
HCT: 27.2 % — ABNORMAL LOW (ref 36.0–46.0)
Hemoglobin: 8.3 g/dL — ABNORMAL LOW (ref 12.0–15.0)
MCH: 31 pg (ref 26.0–34.0)
MCHC: 30.5 g/dL (ref 30.0–36.0)
MCV: 101.5 fL — ABNORMAL HIGH (ref 80.0–100.0)
Platelets: 245 10*3/uL (ref 150–400)
RBC: 2.68 MIL/uL — ABNORMAL LOW (ref 3.87–5.11)
RDW: 14.8 % (ref 11.5–15.5)
WBC: 7.9 10*3/uL (ref 4.0–10.5)
nRBC: 0 % (ref 0.0–0.2)

## 2022-02-17 LAB — COMPREHENSIVE METABOLIC PANEL
ALT: 8 U/L (ref 0–44)
AST: 20 U/L (ref 15–41)
Albumin: 3 g/dL — ABNORMAL LOW (ref 3.5–5.0)
Alkaline Phosphatase: 75 U/L (ref 38–126)
Anion gap: 7 (ref 5–15)
BUN: 45 mg/dL — ABNORMAL HIGH (ref 8–23)
CO2: 25 mmol/L (ref 22–32)
Calcium: 8.5 mg/dL — ABNORMAL LOW (ref 8.9–10.3)
Chloride: 106 mmol/L (ref 98–111)
Creatinine, Ser: 2.81 mg/dL — ABNORMAL HIGH (ref 0.44–1.00)
GFR, Estimated: 17 mL/min — ABNORMAL LOW (ref >60)
Glucose, Bld: 94 mg/dL (ref 70–99)
Potassium: 4.2 mmol/L (ref 3.5–5.1)
Sodium: 138 mmol/L (ref 135–145)
Total Bilirubin: 0.2 mg/dL — ABNORMAL LOW (ref 0.3–1.2)
Total Protein: 6.5 g/dL (ref 6.5–8.1)

## 2022-02-17 LAB — IRON AND TIBC
Iron: 44 ug/dL (ref 28–170)
Saturation Ratios: 21 % (ref 10.4–31.8)
TIBC: 214 ug/dL — ABNORMAL LOW (ref 250–450)
UIBC: 170 ug/dL

## 2022-02-17 LAB — FERRITIN: Ferritin: 260 ng/mL (ref 11–307)

## 2022-02-17 MED ORDER — EPOETIN ALFA-EPBX 10000 UNIT/ML IJ SOLN
20000.0000 [IU] | Freq: Once | INTRAMUSCULAR | Status: AC
  Administered 2022-02-17: 20000 [IU] via SUBCUTANEOUS
  Filled 2022-02-17: qty 2

## 2022-02-20 ENCOUNTER — Other Ambulatory Visit: Payer: Self-pay | Admitting: Emergency Medicine

## 2022-02-20 DIAGNOSIS — D631 Anemia in chronic kidney disease: Secondary | ICD-10-CM

## 2022-02-22 ENCOUNTER — Other Ambulatory Visit: Payer: Self-pay

## 2022-02-22 ENCOUNTER — Encounter: Payer: Self-pay | Admitting: Surgery

## 2022-02-22 ENCOUNTER — Ambulatory Visit (INDEPENDENT_AMBULATORY_CARE_PROVIDER_SITE_OTHER): Payer: Medicare PPO | Admitting: Surgery

## 2022-02-22 VITALS — BP 132/55 | HR 56 | Temp 98.7°F | Ht 63.0 in | Wt 163.8 lb

## 2022-02-22 DIAGNOSIS — N186 End stage renal disease: Secondary | ICD-10-CM | POA: Diagnosis not present

## 2022-02-22 DIAGNOSIS — Z09 Encounter for follow-up examination after completed treatment for conditions other than malignant neoplasm: Secondary | ICD-10-CM

## 2022-02-22 DIAGNOSIS — Z992 Dependence on renal dialysis: Secondary | ICD-10-CM | POA: Diagnosis not present

## 2022-02-22 NOTE — Patient Instructions (Signed)
   Follow-up with our office as needed.  Please call and ask to speak with a nurse if you develop questions or concerns.  

## 2022-02-23 ENCOUNTER — Ambulatory Visit: Payer: Medicare PPO | Admitting: Urology

## 2022-02-23 ENCOUNTER — Inpatient Hospital Stay: Payer: Medicare PPO

## 2022-02-23 ENCOUNTER — Ambulatory Visit: Payer: Medicare PPO

## 2022-02-25 NOTE — Progress Notes (Signed)
Alexandria Moody is 2 and half weeks after laparoscopic PD catheter placement.  She is doing very well.  Minimal abdominal pain.  She is going to start some training.  No fevers no chills. ? ?PE NAD ?Abd: Soft incisions healing well without evidence of infection or peritonitis.  Catheter in place without infection. ? ?A/P Doing very well w/o complications ?RTC prn ?

## 2022-03-02 ENCOUNTER — Inpatient Hospital Stay: Payer: Medicare PPO

## 2022-03-02 ENCOUNTER — Other Ambulatory Visit: Payer: Self-pay

## 2022-03-02 VITALS — BP 138/55 | HR 61

## 2022-03-02 DIAGNOSIS — N184 Chronic kidney disease, stage 4 (severe): Secondary | ICD-10-CM | POA: Diagnosis not present

## 2022-03-02 LAB — HEMOGLOBIN AND HEMATOCRIT, BLOOD
HCT: 28.9 % — ABNORMAL LOW (ref 36.0–46.0)
Hemoglobin: 9 g/dL — ABNORMAL LOW (ref 12.0–15.0)

## 2022-03-02 MED ORDER — EPOETIN ALFA-EPBX 10000 UNIT/ML IJ SOLN
20000.0000 [IU] | Freq: Once | INTRAMUSCULAR | Status: AC
  Administered 2022-03-02: 20000 [IU] via SUBCUTANEOUS
  Filled 2022-03-02: qty 2

## 2022-03-09 ENCOUNTER — Inpatient Hospital Stay: Payer: Medicare PPO

## 2022-03-09 ENCOUNTER — Other Ambulatory Visit: Payer: Self-pay

## 2022-03-09 VITALS — BP 126/66 | HR 61

## 2022-03-09 DIAGNOSIS — M25552 Pain in left hip: Secondary | ICD-10-CM | POA: Insufficient documentation

## 2022-03-09 DIAGNOSIS — D631 Anemia in chronic kidney disease: Secondary | ICD-10-CM

## 2022-03-09 DIAGNOSIS — N184 Chronic kidney disease, stage 4 (severe): Secondary | ICD-10-CM | POA: Diagnosis not present

## 2022-03-09 LAB — HEMOGLOBIN AND HEMATOCRIT, BLOOD
HCT: 31.6 % — ABNORMAL LOW (ref 36.0–46.0)
Hemoglobin: 9.7 g/dL — ABNORMAL LOW (ref 12.0–15.0)

## 2022-03-09 MED ORDER — EPOETIN ALFA-EPBX 10000 UNIT/ML IJ SOLN
20000.0000 [IU] | Freq: Once | INTRAMUSCULAR | Status: AC
  Administered 2022-03-09: 20000 [IU] via SUBCUTANEOUS
  Filled 2022-03-09: qty 2

## 2022-03-16 ENCOUNTER — Inpatient Hospital Stay: Payer: Medicare PPO

## 2022-03-16 VITALS — BP 87/47 | HR 58

## 2022-03-16 DIAGNOSIS — N184 Chronic kidney disease, stage 4 (severe): Secondary | ICD-10-CM | POA: Diagnosis not present

## 2022-03-16 DIAGNOSIS — D631 Anemia in chronic kidney disease: Secondary | ICD-10-CM

## 2022-03-16 LAB — HEMOGLOBIN AND HEMATOCRIT, BLOOD
HCT: 30.2 % — ABNORMAL LOW (ref 36.0–46.0)
Hemoglobin: 9.6 g/dL — ABNORMAL LOW (ref 12.0–15.0)

## 2022-03-16 MED ORDER — EPOETIN ALFA-EPBX 10000 UNIT/ML IJ SOLN
20000.0000 [IU] | Freq: Once | INTRAMUSCULAR | Status: AC
  Administered 2022-03-16: 20000 [IU] via SUBCUTANEOUS
  Filled 2022-03-16: qty 2

## 2022-03-23 ENCOUNTER — Inpatient Hospital Stay: Payer: Medicare PPO

## 2022-03-30 ENCOUNTER — Inpatient Hospital Stay: Payer: Medicare PPO

## 2022-04-06 ENCOUNTER — Inpatient Hospital Stay: Payer: Medicare PPO

## 2022-04-06 ENCOUNTER — Ambulatory Visit: Payer: Medicare PPO | Admitting: Nurse Practitioner

## 2022-04-06 ENCOUNTER — Encounter (INDEPENDENT_AMBULATORY_CARE_PROVIDER_SITE_OTHER): Payer: Self-pay | Admitting: Ophthalmology

## 2022-04-06 ENCOUNTER — Encounter (INDEPENDENT_AMBULATORY_CARE_PROVIDER_SITE_OTHER): Payer: Medicare PPO | Admitting: Ophthalmology

## 2022-04-06 ENCOUNTER — Ambulatory Visit (INDEPENDENT_AMBULATORY_CARE_PROVIDER_SITE_OTHER): Payer: Medicare PPO | Admitting: Ophthalmology

## 2022-04-06 ENCOUNTER — Other Ambulatory Visit: Payer: Medicare PPO

## 2022-04-06 DIAGNOSIS — E113412 Type 2 diabetes mellitus with severe nonproliferative diabetic retinopathy with macular edema, left eye: Secondary | ICD-10-CM | POA: Diagnosis not present

## 2022-04-06 DIAGNOSIS — H353221 Exudative age-related macular degeneration, left eye, with active choroidal neovascularization: Secondary | ICD-10-CM | POA: Diagnosis not present

## 2022-04-06 MED ORDER — BEVACIZUMAB 2.5 MG/0.1ML IZ SOSY
2.5000 mg | PREFILLED_SYRINGE | INTRAVITREAL | Status: AC | PRN
  Administered 2022-04-06: 2.5 mg via INTRAVITREAL

## 2022-04-06 NOTE — Assessment & Plan Note (Signed)
Perifoveal CME, likely associated CNVM nonetheless improved post Avastin 7 weeks previous ?

## 2022-04-06 NOTE — Progress Notes (Signed)
? ? ?04/06/2022 ? ?  ? ?CHIEF COMPLAINT ?Patient presents for  ?Chief Complaint  ?Patient presents with  ? Macular Degeneration  ? ? ? ? ?HISTORY OF PRESENT ILLNESS: ?Alexandria Moody is a 76 y.o. female who presents to the clinic today for:  ? ?HPI   ?7 weeks for DILATE OU, AVASTIN OCT, OS. ?Pt stated, " ive been seeing wiggly lines when watching TV for the left eye. When I cover my left eye and look out of my right, I cant see words very clearly."  ?Pt denies floaters and FOL. ?Pt started dialysis about a month ago. ? ? ?Last edited by Silvestre Moment on 04/06/2022  2:10 PM.  ?  ? ? ?Referring physician: ?Bubba Camp, FNP ?[address] ?[place],  [place] [address] ? ?HISTORICAL INFORMATION:  ? ?Selected notes from the [facility] ?  ?   ? ?CURRENT MEDICATIONS: ?No current outpatient medications on file. (Ophthalmic Drugs)  ? ?No current facility-administered medications for this visit. (Ophthalmic Drugs)  ? ?Current Outpatient Medications (Other)  ?Medication Sig  ? albuterol (VENTOLIN HFA) 108 (90 Base) MCG/ACT inhaler Inhale 2 puffs into the lungs every 6 (six) hours as needed for wheezing or shortness of breath.  ? amLODipine (NORVASC) 5 MG tablet Take 5 mg by mouth 2 (two) times daily.  ? aspirin 81 MG EC tablet Take 81 mg by mouth daily.  ? calcitRIOL (ROCALTROL) 0.25 MCG capsule Take 0.25 mcg by mouth daily.  ? Cholecalciferol 125 MCG (5000 UT) capsule Take 5,000 Units by mouth daily.  ? citalopram (CELEXA) 20 MG tablet Take 20 mg by mouth every morning.  ? fluocinonide (LIDEX) 0.05 % external solution Apply 1 application topically 2 (two) times daily as needed (scalp irritation).  ? gabapentin (NEURONTIN) 100 MG capsule Take 200 mg by mouth at bedtime.  ? hydrALAZINE (APRESOLINE) 50 MG tablet Take 50 mg by mouth in the morning and at bedtime.  ? hydroxychloroquine (PLAQUENIL) 200 MG tablet Take 200 mg by mouth daily.  ? insulin glargine, 1 Unit Dial, (TOUJEO SOLOSTAR) 300 UNIT/ML Solostar Pen Inject 32 Units  into the skin every morning.  ? Insulin Pen Needle (BD PEN NEEDLE NANO 2ND GEN) 32G X 4 MM MISC daily.  ? isosorbide mononitrate (IMDUR) 120 MG 24 hr tablet Take 120 mg by mouth at bedtime.  ? levothyroxine (SYNTHROID) 75 MCG tablet Take 75 mcg by mouth daily before breakfast.  ? linagliptin (TRADJENTA) 5 MG TABS tablet Take 5 mg by mouth every morning.  ? losartan (COZAAR) 25 MG tablet Take 25 mg by mouth every morning.  ? nitroGLYCERIN (NITROSTAT) 0.4 MG SL tablet Place 0.4 mg under the tongue every 5 (five) minutes as needed for chest pain.  ? Omega-3 Fatty Acids (FISH OIL) 1000 MG CAPS Take 1,000 mg by mouth in the morning and at bedtime.  ? omeprazole (PRILOSEC) 20 MG capsule Take 20 mg by mouth every morning.  ? ranolazine (RANEXA) 500 MG 12 hr tablet Take 500 mg by mouth 2 (two) times daily.  ? rosuvastatin (CRESTOR) 20 MG tablet Take 20 mg by mouth every morning.  ? torsemide (DEMADEX) 20 MG tablet Take 20 mg by mouth every morning.  ? ?No current facility-administered medications for this visit. (Other)  ? ? ? ? ?REVIEW OF SYSTEMS: ?ROS   ?Negative for: Constitutional, Gastrointestinal, Neurological, Skin, Genitourinary, Musculoskeletal, HENT, Endocrine, Cardiovascular, Eyes, Respiratory, Psychiatric, Allergic/Imm, Heme/Lymph ?Last edited by Silvestre Moment on 04/06/2022  2:10 PM.  ?  ? ? ? ?  ALLERGIES ?Allergies  ?Allergen Reactions  ? Tape   ?  Tears skin  ? ? ?PAST MEDICAL HISTORY ?Past Medical History:  ?Diagnosis Date  ? Anemia of chronic renal failure   ? a.) recieving iron infusions + epoetin alfa-epbx (Retacrit)  ? Anginal pain ([place])   ? Aortic atherosclerosis ([place])   ? Arthritis   ? Atherosclerosis of native arteries of extremity with intermittent claudication ([place])   ? a.) ABI/TBI 06/24/2021: 0.95 RIGHT, 0.72 LEFT  ? Bilateral carotid artery disease ([place]) 07/25/2021  ? a.) carotid doppler 07/25/2021: < 89% RICA, < 21% LICA.  ? Carotid atherosclerosis, bilateral   ? CHF (congestive heart failure) ([place])   ?  a.) TTE 05/12/2020: LVEF 55-60%; mild LA enlargement; trivial to mild MR; G1DD.  ? COPD (chronic obstructive pulmonary disease) ([place])   ? Coronary artery disease   ? a.) 3v CABG 2002 --> LIMA-LAD, SVG-diagonal, SVG-OM  ? ESRD (end stage renal disease) ([place])   ? GERD (gastroesophageal reflux disease)   ? 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 a.) smolding phase ANCA associated pauci immune  ? Heart murmur   ? HLD (hyperlipidemia)   ? Hypertension   ? Hypothyroidism   ? Long term current use of immunosuppressive drug   ? a.) hydroxychloriquine for ANCA (+) glomerulonephritis  ? Macular degeneration   ? PAD (peripheral artery disease) ([place])   ? Pneumonia 11/2021  ? S/P CABG x 3 2002  ? a.) LIMA-LAD, SVG-diagnoal, SVG-OM  ? Skin cancer   ? T2DM (type 2 diabetes mellitus) ([place])   ? TIA (transient ischemic attack)   ? ?Past Surgical History:  ?Procedure Laterality Date  ? ABDOMINAL HYSTERECTOMY    ? age 37  ? CAPD INSERTION N/A 02/03/2022  ? Procedure: LAPAROSCOPIC INSERTION CONTINUOUS AMBULATORY PERITONEAL DIALYSIS  (CAPD) CATHETER;  Surgeon: Jules Husbands, MD;  Location: [facility] ORS;  Service: General;  Laterality: N/A;  Provider requesting 1.5 hours / 90 minutes for procedure.  ? CATARACT EXTRACTION Bilateral 2014  ? CHOLECYSTECTOMY  2020  ? COLONOSCOPY    ? CORONARY ARTERY BYPASS GRAFT N/A 2002  ? Procedure: 3v CORONARY ARTERY ARTERY BYPASS GRAFT  ? EYE SURGERY Left 07/22/2020  ? Dr. Towanda Octave, Vit and Marta Lamas  ? ? ?FAMILY HISTORY ?History reviewed. No pertinent family history. ? ?SOCIAL HISTORY ?Social History  ? ?Tobacco Use  ? Smoking status: Former  ?  Packs/day: 2.00  ?  Years: 25.00  ?  Pack years: 50.00  ?  Types: Cigarettes  ?  Quit date: 2000  ?  Years since quitting: 23.3  ?  Passive exposure: Past  ? Smokeless tobacco: Never  ?Vaping Use  ? Vaping Use: Never used  ?Substance Use Topics  ? Alcohol use: Never  ? Drug use: Never  ? ?  ? ?   ? ?OPHTHALMIC EXAM: ? ?Base Eye Exam   ? ? Visual Acuity (ETDRS)   ? ?   Right Left  ? Dist cc 20/40 +2 20/40 +2  ? ? Correction: Glasses  ? ?  ?  ? ? Tonometry (Tonopen, 2:15 PM)   ? ?   Right Left  ? Pressure 9 12  ? ?  ?  ? ? Pupils   ? ?   Pupils APD  ? Right PERRL None  ? Left PERRL None  ? ?  ?  ? ? Visual Fields   ? ?   Left Right  ?  Full Full  ? ?  ?  ? ? Extraocular Movement   ? ?   Right Left  ?  Full Full  ? ?  ?  ? ? Neuro/Psych   ? ? Oriented x3: Yes  ? Mood/Affect: Normal  ? ?  ?  ? ? Dilation   ? ? Both eyes: 1.0% Mydriacyl, 2.5% Phenylephrine @ 2:15 PM  ? ?  ?  ? ?  ? ?Slit Lamp and Fundus Exam   ? ? External Exam   ? ?   Right Left  ? External Normal Normal  ? ?  ?  ? ? Slit Lamp Exam   ? ?   Right Left  ? Lids/Lashes Normal Normal  ? Conjunctiva/Sclera White and quiet White and quiet  ? Cornea Clear Clear  ? Anterior Chamber Deep and quiet Deep and quiet  ? Iris Round and reactive Round and reactive  ? Lens Centered posterior chamber intraocular lens Centered posterior chamber intraocular lens  ? Anterior Vitreous Normal Normal  ? ?  ?  ? ? Fundus Exam   ? ?   Right Left  ? Posterior Vitreous  Clear vitrectomized  ? Disc  Normal  ? C/D Ratio  0.15  ? Macula  Microaneurysms, no macular thickening, no exudates, no clinically significant macular edema  ? Vessels  NPDR- Moderate  ? Periphery  Normal  ? ?  ?  ? ?  ? ? ?IMAGING AND PROCEDURES  ?Imaging and Procedures for 04/06/22 ? ?OCT, Retina - OU - Both Eyes   ? ?   ?Right Eye ?Quality was good. Scan locations included subfoveal. Central Foveal Thickness: 318. Progression has improved. Findings include abnormal foveal contour.  ? ?Left Eye ?Quality was good. Scan locations included subfoveal. Central Foveal Thickness: 258. Progression has improved. Findings include abnormal foveal contour.  ? ?Notes ?OD with minor intraretinal fluid, status post vitrectomy membrane peel  much less thickening, much less outer retinal disturbance as compared to  epiretinal membrane present November 2021 vastly improved overall since surgical intervention right eye ? ?OS with perifoveal thickening with intraretinal fluid and extension to the outer retina inferonasal to FAZ

## 2022-04-10 ENCOUNTER — Telehealth: Payer: Self-pay | Admitting: Oncology

## 2022-04-10 NOTE — Telephone Encounter (Signed)
pt called in to cancell all appts for Dr. Tasia Catchings, pt states that she is [facility].Marland KitchenKJ  ?

## 2022-04-13 ENCOUNTER — Inpatient Hospital Stay: Payer: Medicare PPO

## 2022-04-13 ENCOUNTER — Other Ambulatory Visit: Payer: Self-pay | Admitting: Otolaryngology

## 2022-04-13 DIAGNOSIS — R1310 Dysphagia, unspecified: Secondary | ICD-10-CM

## 2022-04-20 ENCOUNTER — Inpatient Hospital Stay: Payer: Medicare PPO

## 2022-04-20 ENCOUNTER — Encounter (INDEPENDENT_AMBULATORY_CARE_PROVIDER_SITE_OTHER): Payer: Medicare PPO | Admitting: Ophthalmology

## 2022-04-26 ENCOUNTER — Ambulatory Visit
Admission: RE | Admit: 2022-04-26 | Discharge: 2022-04-26 | Disposition: A | Discharge status: Home / Self Care | Payer: Medicare PPO | Source: Ambulatory Visit | Attending: Otolaryngology | Admitting: Otolaryngology

## 2022-04-26 DIAGNOSIS — R1314 Dysphagia, pharyngoesophageal phase: Secondary | ICD-10-CM | POA: Insufficient documentation

## 2022-04-26 DIAGNOSIS — R1310 Dysphagia, unspecified: Secondary | ICD-10-CM | POA: Insufficient documentation

## 2022-04-26 NOTE — Therapy (Signed)
[place] [facility] ?[address] ?[place], [place], [address] ?Phone: [phone]   Fax:    ? ?Modified Barium Swallow ? ?Patient Details  ?Name: Alexandria Moody ?MRN: [id number] ?Date of Birth: 06/15/1946 ?No data recorded ? ?Encounter Date: 04/26/2022 ? ? End of Session - 04/26/22 1735   ? ? Visit Number 1   ? Number of Visits 1   ? Date for SLP Re-Evaluation 04/26/22   ? SLP Start Time 1240   ? SLP Stop Time  1315   ? SLP Time Calculation (min) 35 min   ? Activity Tolerance Patient tolerated treatment well   ? ?  ?  ? ?  ? ? ?Past Medical History:  ?Diagnosis Date  ? Anemia of chronic renal failure   ? a.) recieving iron infusions + epoetin alfa-epbx (Retacrit)  ? Anginal pain ([place])   ? Aortic atherosclerosis ([place])   ? Arthritis   ? Atherosclerosis of native arteries of extremity with intermittent claudication ([place])   ? a.) ABI/TBI 06/24/2021: 0.95 RIGHT, 0.72 LEFT  ? Bilateral carotid artery disease ([place]) 07/25/2021  ? a.) carotid doppler 07/25/2021: < 78% RICA, < 67% LICA.  ? Carotid atherosclerosis, bilateral   ? CHF (congestive heart failure) ([place])   ? a.) TTE 05/12/2020: LVEF 55-60%; mild LA enlargement; trivial to mild MR; G1DD.  ? COPD (chronic obstructive pulmonary disease) ([place])   ? Coronary artery disease   ? a.) 3v CABG 2002 --> LIMA-LAD, SVG-diagonal, SVG-OM  ? ESRD (end stage renal disease) ([place])   ? GERD (gastroesophageal reflux disease)   ? 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 a.) smolding phase ANCA associated pauci immune  ? Heart murmur   ? HLD (hyperlipidemia)   ? Hypertension   ? Hypothyroidism   ? Long term current use of immunosuppressive drug   ? a.) hydroxychloriquine for ANCA (+) glomerulonephritis  ? Macular degeneration   ? PAD (peripheral artery disease) ([place])   ? Pneumonia 11/2021  ? S/P CABG x 3 2002  ? a.) LIMA-LAD, SVG-diagnoal, SVG-OM  ? Skin cancer   ? T2DM (type  2 diabetes mellitus) ([place])   ? TIA (transient ischemic attack)   ? ? ?Past Surgical History:  ?Procedure Laterality Date  ? ABDOMINAL HYSTERECTOMY    ? age 15  ? CAPD INSERTION N/A 02/03/2022  ? Procedure: LAPAROSCOPIC INSERTION CONTINUOUS AMBULATORY PERITONEAL DIALYSIS  (CAPD) CATHETER;  Surgeon: Jules Husbands, MD;  Location: [facility] ORS;  Service: General;  Laterality: N/A;  Provider requesting 1.5 hours / 90 minutes for procedure.  ? CATARACT EXTRACTION Bilateral 2014  ? CHOLECYSTECTOMY  2020  ? COLONOSCOPY    ? CORONARY ARTERY BYPASS GRAFT N/A 2002  ? Procedure: 3v CORONARY ARTERY ARTERY BYPASS GRAFT  ? EYE SURGERY Left 07/22/2020  ? Dr. Towanda Octave, Vit and Marta Lamas  ? ? ?There were no vitals filed for this visit. ? ? ? 04/26/22 1300  ?SLP Visit Information  ?SLP Received On 04/26/22  ?Subjective  ?Subjective Pt reports difficulty with solids  ?Pain Assessment  ?Pain Assessment 0-10  ?Pain Score 6  ?Pain Location generalized; had fall recently  ?General Information  ?Date of Onset 04/14/22  ?HPI Patient is a 76 y.o. female with PMH noted for chronic kidney disease stage IV, COPD, CAD, CHF, , diabetes type 2, hypertension, PVD, hypothyroidism, GERD chronically secondary to chronic kidney disease. Hx RLL PNA in 11/2021. Pt reports dysphagia to solids, globus  sensation.  ?Type of Study MBS-Modified Barium Swallow Study  ?Diet Prior to this Study Regular;Thin liquids  ?Temperature Spikes Noted No  ?Respiratory Status Room air  ?History of Recent Intubation No  ?Behavior/Cognition Alert;Cooperative;Pleasant mood  ?Oral Cavity Assessment WFL  ?Oral Cavity - Dentition Adequate natural dentition  ?Vision Functional for self feeding  ?Self-Feeding Abilities Able to feed self  ?Patient Positioning Upright in chair  ?Baseline Vocal Quality Normal  ?Volitional Cough Strong  ?Volitional Swallow Able to elicit  ?Anatomy Other (Comment) ?(small cricopharyngeal bar)  ?Pharyngeal Secretions Not observed secondary MBS  ?Oral  Motor/Sensory Function  ?Overall Oral Motor/Sensory Function WFL  ?Oral Preparation/Oral Phase  ?Oral Phase WFL  ?Pharyngeal Phase  ?Pharyngeal Phase WFL  ?Pharyngeal - Pudding  ?Pharyngeal- Pudding Teaspoon WFL  ?Pharyngeal Material does not enter airway  ?Pharyngeal - Honey  ?Pharyngeal- Honey Teaspoon WFL  ?Pharyngeal Material does not enter airway  ?Pharyngeal - Nectar  ?Pharyngeal- Nectar Teaspoon WFL  ?Pharyngeal Material does not enter airway  ?Pharyngeal- Nectar Cup [facility]  ?Pharyngeal Material does not enter airway  ?Pharyngeal - Thin  ?Pharyngeal- Thin Teaspoon WFL  ?Pharyngeal Material does not enter airway  ?Pharyngeal- Thin Cup [facility]  ?Pharyngeal Material does not enter airway  ?Pharyngeal - Solids  ?Pharyngeal- Puree WFL  ?Pharyngeal Material does not enter airway  ?Pharyngeal- Regular WFL  ?Pharyngeal Material does not enter airway  ?Cervical Esophageal Phase  ?Cervical Esophageal Phase Impaired  ?Cervical Esophageal Phase - Pudding  ?Pudding Teaspoon Reduced cricopharyngeal relaxation;Prominent cricopharyngeal segment;Esophageal backflow into cervical esophagus  ?Cervical Esophageal Phase - Honey  ?Honey Teaspoon Reduced cricopharyngeal relaxation;Prominent cricopharyngeal segment;Esophageal backflow into cervical esophagus  ?Cervical Esophageal Phase - Nectar  ?Nectar Teaspoon Reduced cricopharyngeal relaxation;Prominent cricopharyngeal segment  ?Nectar Cup Reduced cricopharyngeal relaxation;Prominent cricopharyngeal segment  ?Cervical Esophageal Phase - Thin  ?Thin Teaspoon Reduced cricopharyngeal relaxation;Prominent cricopharyngeal segment  ?Thin Cup Reduced cricopharyngeal relaxation;Prominent cricopharyngeal segment  ?Cervical Esophageal Phase - Solids  ?Puree Reduced cricopharyngeal relaxation;Prominent cricopharyngeal segment;Esophageal backflow into cervical esophagus  ?Regular Reduced cricopharyngeal relaxation;Prominent cricopharyngeal segment;Esophageal backflow into cervical esophagus   ?Clinical Impression  ?Clinical Impression Patient presents with grossly functional oropharyngeal swallowing and mild pharyngoesophageal dysphagia. Oral stage is noted for adequate bolus control, manipulation and transfer. Swallow initiation is timely at the level of the base of tongue. Pharyngeal stage is characterized by adequate tongue base retraction, hyolaryngeal excursion and pharyngeal constriction. There is no laryngeal penetration or aspiration. Pharyngoesophageal phase of the swallow is noted for reduced amplitude of opening of the pharyngoesophageal segment due to small cricopharyngeal bar (confirmed by the radiologist) and anterior osteophytes. Pt was noted to thrust head forward slightly. Given absence of lingual weakness, suspect this maneuver intended to generate additional pressure to propel bolus through the cervical esophagus. There is slowed clearance through the cervical esophagus with solids >liquids as well as small amount of retrograde flow, trace CP segment residue. Pt sensate to residue; second swallow or liquid wash aided in reducing this. A limited scan of the esophagus noted for appearance of slow clearance in thoracic esophagus. Consider further esophageal assesment such as upper endoscopy or manometry. No further skilled ST recommended.  ?SLP Visit Diagnosis Dysphagia, pharyngoesophageal phase (R13.14)  ?Impact on safety and function Mild aspiration risk  ?Swallow Evaluation Recommendations  ?Recommended Consults Consider esophageal assessment;Consider GI evaluation  ?SLP Diet Recommendations Regular solids;Thin liquid  ?Liquid Administration via Cup  ?Medication Administration Whole meds with liquid  ?Supervision Patient able to self feed  ?Compensations Small sips/bites;Slow rate;Multiple dry  swallows after each bite/sip;Follow solids with liquid  ?Treatment Plan  ?Oral Care Recommendations Oral care BID  ?Treatment Recommendations No treatment recommended at this time  ?Follow Up  Recommendations No SLP follow up  ?Individuals Consulted  ?Consulted and Agree with Results and Recommendations Patient;Family member/caregiver  ?Family Member Consulted daughter  ?Progression Toward Goals  ?Progressi

## 2022-04-27 ENCOUNTER — Inpatient Hospital Stay: Payer: Medicare PPO

## 2022-05-04 ENCOUNTER — Inpatient Hospital Stay: Payer: Medicare PPO

## 2022-05-10 ENCOUNTER — Encounter: Payer: Self-pay | Admitting: Oncology

## 2022-05-11 ENCOUNTER — Inpatient Hospital Stay: Payer: Medicare PPO

## 2022-05-25 ENCOUNTER — Other Ambulatory Visit: Payer: Medicare PPO

## 2022-05-25 ENCOUNTER — Ambulatory Visit: Payer: Medicare PPO | Admitting: Oncology

## 2022-05-25 ENCOUNTER — Ambulatory Visit: Payer: Medicare PPO

## 2022-05-29 ENCOUNTER — Encounter (INDEPENDENT_AMBULATORY_CARE_PROVIDER_SITE_OTHER): Payer: Self-pay | Admitting: Ophthalmology

## 2022-05-29 ENCOUNTER — Ambulatory Visit (INDEPENDENT_AMBULATORY_CARE_PROVIDER_SITE_OTHER): Payer: Medicare PPO | Admitting: Ophthalmology

## 2022-05-29 DIAGNOSIS — E113412 Type 2 diabetes mellitus with severe nonproliferative diabetic retinopathy with macular edema, left eye: Secondary | ICD-10-CM | POA: Diagnosis not present

## 2022-05-29 DIAGNOSIS — E113391 Type 2 diabetes mellitus with moderate nonproliferative diabetic retinopathy without macular edema, right eye: Secondary | ICD-10-CM | POA: Diagnosis not present

## 2022-05-29 DIAGNOSIS — H353221 Exudative age-related macular degeneration, left eye, with active choroidal neovascularization: Secondary | ICD-10-CM | POA: Diagnosis not present

## 2022-05-29 MED ORDER — BEVACIZUMAB 2.5 MG/0.1ML IZ SOSY
2.5000 mg | PREFILLED_SYRINGE | INTRAVITREAL | Status: AC | PRN
  Administered 2022-05-29: 2.5 mg via INTRAVITREAL

## 2022-05-29 NOTE — Assessment & Plan Note (Signed)
Stable OD °

## 2022-05-29 NOTE — Assessment & Plan Note (Signed)
Much improved, postinjection Avastin currently at 7.5-week interval.  Looks great

## 2022-05-29 NOTE — Progress Notes (Signed)
05/29/2022     CHIEF COMPLAINT Patient presents for  Chief Complaint  Patient presents with   Macular Degeneration      HISTORY OF PRESENT ILLNESS: Alexandria Moody is a 76 y.o. female who presents to the clinic today for:   HPI   7 weeks dilate OS, Avastin OS, OCT. Patient states vision is stable and unchanged since last visit. Denies any new floaters or FOL. Patient has new glasses as of today from [facility]. Last edited by Laurin Coder on 05/29/2022  2:03 PM.      Referring physician: Bubba Camp, FNP [address] [place],  [place] [address]  HISTORICAL INFORMATION:   Selected notes from the [facility]: No current outpatient medications on file. (Ophthalmic Drugs)   No current facility-administered medications for this visit. (Ophthalmic Drugs)   Current Outpatient Medications (Other)  Medication Sig   albuterol (VENTOLIN HFA) 108 (90 Base) MCG/ACT inhaler Inhale 2 puffs into the lungs every 6 (six) hours as needed for wheezing or shortness of breath.   amLODipine (NORVASC) 5 MG tablet Take 5 mg by mouth 2 (two) times daily.   aspirin 81 MG EC tablet Take 81 mg by mouth daily.   calcitRIOL (ROCALTROL) 0.25 MCG capsule Take 0.25 mcg by mouth daily.   Cholecalciferol 125 MCG (5000 UT) capsule Take 5,000 Units by mouth daily.   citalopram (CELEXA) 20 MG tablet Take 20 mg by mouth every morning.   fluocinonide (LIDEX) 0.05 % external solution Apply 1 application topically 2 (two) times daily as needed (scalp irritation).   gabapentin (NEURONTIN) 100 MG capsule Take 200 mg by mouth at bedtime.   hydrALAZINE (APRESOLINE) 50 MG tablet Take 50 mg by mouth in the morning and at bedtime.   hydroxychloroquine (PLAQUENIL) 200 MG tablet Take 200 mg by mouth daily.   insulin glargine, 1 Unit Dial, (TOUJEO SOLOSTAR) 300 UNIT/ML Solostar Pen Inject 32 Units into the skin every morning.   Insulin Pen Needle (BD PEN NEEDLE NANO  2ND GEN) 32G X 4 MM MISC daily.   isosorbide mononitrate (IMDUR) 120 MG 24 hr tablet Take 120 mg by mouth at bedtime.   levothyroxine (SYNTHROID) 75 MCG tablet Take 75 mcg by mouth daily before breakfast.   linagliptin (TRADJENTA) 5 MG TABS tablet Take 5 mg by mouth every morning.   losartan (COZAAR) 25 MG tablet Take 25 mg by mouth every morning.   nitroGLYCERIN (NITROSTAT) 0.4 MG SL tablet Place 0.4 mg under the tongue every 5 (five) minutes as needed for chest pain.   Omega-3 Fatty Acids (FISH OIL) 1000 MG CAPS Take 1,000 mg by mouth in the morning and at bedtime.   omeprazole (PRILOSEC) 20 MG capsule Take 20 mg by mouth every morning.   ranolazine (RANEXA) 500 MG 12 hr tablet Take 500 mg by mouth 2 (two) times daily.   rosuvastatin (CRESTOR) 20 MG tablet Take 20 mg by mouth every morning.   torsemide (DEMADEX) 20 MG tablet Take 20 mg by mouth every morning.   No current facility-administered medications for this visit. (Other)      REVIEW OF SYSTEMS: ROS   Negative for: Constitutional, Gastrointestinal, Neurological, Skin, Genitourinary, Musculoskeletal, HENT, Endocrine, Cardiovascular, Eyes, Respiratory, Psychiatric, Allergic/Imm, Heme/Lymph Last edited by Hurman Horn, MD on 05/29/2022  2:33 PM.       ALLERGIES Allergies  Allergen Reactions   Tape     Tears skin  PAST MEDICAL HISTORY Past Medical History:  Diagnosis Date   Anemia of chronic renal failure    a.) recieving iron infusions + epoetin alfa-epbx (Retacrit)   Anginal pain (HCC)    Aortic atherosclerosis (HCC)    Arthritis    Atherosclerosis of native arteries of extremity with intermittent claudication ([place])    a.) ABI/TBI 06/24/2021: 0.95 RIGHT, 0.72 LEFT   Bilateral carotid artery disease ([place]) 07/25/2021   a.) carotid doppler 07/25/2021: < 43% RICA, < 15% LICA.   Carotid atherosclerosis, bilateral    CHF (congestive heart failure) ([place])    a.) TTE 05/12/2020: LVEF 55-60%; mild LA enlargement; trivial  to mild MR; G1DD.   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   a.) smolding phase ANCA associated pauci immune   Heart murmur    HLD (hyperlipidemia)    Hypertension    Hypothyroidism    Long term current use of immunosuppressive drug    a.) hydroxychloriquine for ANCA (+) glomerulonephritis   Macular degeneration    PAD (peripheral artery disease) ([place])    Pneumonia 11/2021   S/P CABG x 3 2002   a.) LIMA-LAD, SVG-diagnoal, SVG-OM   Skin cancer    T2DM (type 2 diabetes mellitus) ([place])    TIA (transient ischemic attack)    Past Surgical History:  Procedure Laterality Date   ABDOMINAL HYSTERECTOMY     age 53   CAPD INSERTION N/A 02/03/2022   Procedure: LAPAROSCOPIC INSERTION CONTINUOUS AMBULATORY PERITONEAL DIALYSIS  (CAPD) CATHETER;  Surgeon: Jules Husbands, MD;  Location: [facility] ORS;  Service: General;  Laterality: N/A;  Provider requesting 1.5 hours / 90 minutes for procedure.   CATARACT EXTRACTION Bilateral 2014   CHOLECYSTECTOMY  2020   COLONOSCOPY     CORONARY ARTERY BYPASS GRAFT N/A 2002   Procedure: 3v CORONARY ARTERY ARTERY BYPASS GRAFT   EYE SURGERY Left 07/22/2020   Dr. Towanda Octave, Vit and Mem Peel    FAMILY HISTORY History reviewed. No pertinent family history.  SOCIAL HISTORY Social History   Tobacco Use   Smoking status: Former    Packs/day: 2.00    Years: 25.00    Total pack years: 50.00    Types: Cigarettes    Quit date: 2000    Years since quitting: 23.4    Passive exposure: Past   Smokeless tobacco: Never  Vaping Use   Vaping Use: Never used  Substance Use Topics   Alcohol use: Never   Drug use: Never         OPHTHALMIC EXAM:  Base Eye Exam     Visual Acuity  (ETDRS)       Right Left   Dist cc 20/30 -2 20/20 -2   Dist ph cc NI     Correction: Glasses         Tonometry (Tonopen, 2:06 PM)       Right Left   Pressure 17 17         Pupils       Pupils Dark Light React APD   Right PERRL 5 4 Slow None   Left PERRL 5 4 Slow None         Visual Fields (Counting fingers)       Left Right  Full Full         Extraocular Movement       Right Left    Full Full         Neuro/Psych     Oriented x3: Yes   Mood/Affect: Normal         Dilation     Left eye: 1.0% Mydriacyl, 2.5% Phenylephrine @ 2:06 PM           Slit Lamp and Fundus Exam     External Exam       Right Left   External Normal Normal         Slit Lamp Exam       Right Left   Lids/Lashes Normal Normal   Conjunctiva/Sclera White and quiet White and quiet   Cornea Clear Clear   Anterior Chamber Deep and quiet Deep and quiet   Iris Round and reactive Round and reactive   Lens Centered posterior chamber intraocular lens Centered posterior chamber intraocular lens   Anterior Vitreous Normal Normal         Fundus Exam       Right Left   Posterior Vitreous  Clear vitrectomized   Disc  Normal   C/D Ratio  0.15   Macula  Microaneurysms, no macular thickening, no exudates, no clinically significant macular edema   Vessels  NPDR- Moderate   Periphery  Normal            IMAGING AND PROCEDURES  Imaging and Procedures for 05/29/22  Intravitreal Injection, Pharmacologic Agent - OS - Left Eye       Time Out 05/29/2022. 2:35 PM. Confirmed correct patient, procedure, site, and patient consented.   Anesthesia Topical anesthesia was used. Anesthetic medications included Lidocaine 4%.   Procedure Preparation included 5% betadine to ocular surface, 10% betadine to eyelids, Ofloxacin . A 30 gauge needle was used.   Injection: 2.5 mg bevacizumab 2.5 MG/0.1ML   Route: Intravitreal, Site: Left Eye   NDC: [phone], Lot: [id number],  Expiration date: 07/16/2022   Post-op Post injection exam found visual acuity of at least counting fingers. The patient tolerated the procedure well. There were no complications. The patient received written and verbal post procedure care education. Post injection medications included ocuflox.      OCT, Retina - OU - Both Eyes       Right Eye Quality was good. Scan locations included subfoveal. Central Foveal Thickness: 300. Progression has improved. Findings include abnormal foveal contour.   Left Eye Quality was good. Scan locations included subfoveal. Central Foveal Thickness: 256. Progression has improved. Findings include abnormal foveal contour.   Notes OD with minor intraretinal fluid, status post vitrectomy membrane peel  much less thickening, much less outer retinal disturbance as compared to epiretinal membrane present November 2021 vastly improved overall since surgical intervention right eye  OS with perifoveal thickening with intraretinal fluid and extension to the outer retina inferonasal to FAZ consistent with a rap like lesion when correlated with clinical findings on macular evaluation, today with much less CME and intraretinal fluid post recent Avastin.  Vastly improved as compared to onset October 2022 today again at 7-week interval left eye, will need repeat injection Avastin today and examination again in 7 weeks, with less thickening OS TODAY             ASSESSMENT/PLAN:  Severe nonproliferative diabetic retinopathy of left eye, with macular edema, associated with type 2 diabetes mellitus (HCC) Stable not active  Exudative age-related macular degeneration  of left eye with active choroidal neovascularization ([place]) Much improved, postinjection Avastin currently at 7.5-week interval.  Looks great  Moderate nonproliferative diabetic retinopathy of right eye (HCC) Stable OD     ICD-10-CM   1. Exudative age-related macular degeneration of left eye with active  choroidal neovascularization (HCC)  H35.3221 Intravitreal Injection, Pharmacologic Agent - OS - Left Eye    OCT, Retina - OU - Both Eyes    bevacizumab (AVASTIN) SOSY 2.5 mg    2. Severe nonproliferative diabetic retinopathy of left eye, with macular edema, associated with type 2 diabetes mellitus ([place])  D32.2025     3. Moderate nonproliferative diabetic retinopathy of right eye without macular edema associated with type 2 diabetes mellitus ([place])  K27.0623       1.  OS vastly improved and improved acuity as well post injection Avastin onset of therapy in October 2022.  Repeat injection today to maintain reevaluate again in 7 weeks  2.  3.  Ophthalmic Meds Ordered this visit:  Meds ordered this encounter  Medications   bevacizumab (AVASTIN) SOSY 2.5 mg       Return in about 7 weeks (around 07/17/2022) for DILATE OU, AVASTIN OCT, OS.  There are no Patient Instructions on file for this visit.   Explained the diagnoses, plan, and follow up with the patient and they expressed understanding.  Patient expressed understanding of the importance of proper follow up care.   Clent Demark Edilia Ghuman M.D. Diseases & Surgery of the Retina and Vitreous [facility] [place] 05/29/22     Abbreviations: M myopia (nearsighted); A astigmatism; H hyperopia (farsighted); P presbyopia; Mrx spectacle prescription;  CTL contact lenses; OD right eye; OS left eye; OU both eyes  XT exotropia; ET esotropia; PEK punctate epithelial keratitis; PEE punctate epithelial erosions; DES dry eye syndrome; MGD meibomian gland dysfunction; ATs artificial tears; PFAT's preservative free artificial tears; [place] nuclear sclerotic cataract; PSC posterior subcapsular cataract; ERM epi-retinal membrane; PVD posterior vitreous detachment; RD retinal detachment; DM diabetes mellitus; DR diabetic retinopathy; NPDR non-proliferative diabetic retinopathy; PDR proliferative diabetic retinopathy; CSME clinically significant macular  edema; DME diabetic macular edema; dbh dot blot hemorrhages; CWS cotton wool spot; POAG primary open angle glaucoma; C/D cup-to-disc ratio; HVF humphrey visual field; GVF goldmann visual field; OCT optical coherence tomography; IOP intraocular pressure; BRVO Branch retinal vein occlusion; CRVO central retinal vein occlusion; CRAO central retinal artery occlusion; BRAO branch retinal artery occlusion; RT retinal tear; SB scleral buckle; PPV pars plana vitrectomy; VH Vitreous hemorrhage; PRP panretinal laser photocoagulation; IVK intravitreal kenalog; VMT vitreomacular traction; MH Macular hole;  NVD neovascularization of the disc; NVE neovascularization elsewhere; AREDS age related eye disease study; ARMD age related macular degeneration; POAG primary open angle glaucoma; EBMD epithelial/anterior basement membrane dystrophy; ACIOL anterior chamber intraocular lens; IOL intraocular lens; PCIOL posterior chamber intraocular lens; Phaco/IOL phacoemulsification with intraocular lens placement; [place] photorefractive keratectomy; LASIK laser assisted in situ keratomileusis; HTN hypertension; DM diabetes mellitus; COPD chronic obstructive pulmonary disease

## 2022-05-29 NOTE — Assessment & Plan Note (Signed)
Stable not active

## 2022-06-27 ENCOUNTER — Encounter (INDEPENDENT_AMBULATORY_CARE_PROVIDER_SITE_OTHER): Payer: Medicare PPO

## 2022-06-27 ENCOUNTER — Ambulatory Visit (INDEPENDENT_AMBULATORY_CARE_PROVIDER_SITE_OTHER): Payer: Medicare PPO | Admitting: Vascular Surgery

## 2022-07-17 ENCOUNTER — Ambulatory Visit (INDEPENDENT_AMBULATORY_CARE_PROVIDER_SITE_OTHER): Payer: Medicare PPO | Admitting: Ophthalmology

## 2022-07-17 ENCOUNTER — Encounter (INDEPENDENT_AMBULATORY_CARE_PROVIDER_SITE_OTHER): Payer: Self-pay | Admitting: Ophthalmology

## 2022-07-17 DIAGNOSIS — H353221 Exudative age-related macular degeneration, left eye, with active choroidal neovascularization: Secondary | ICD-10-CM | POA: Diagnosis not present

## 2022-07-17 DIAGNOSIS — E113412 Type 2 diabetes mellitus with severe nonproliferative diabetic retinopathy with macular edema, left eye: Secondary | ICD-10-CM | POA: Diagnosis not present

## 2022-07-17 DIAGNOSIS — E113391 Type 2 diabetes mellitus with moderate nonproliferative diabetic retinopathy without macular edema, right eye: Secondary | ICD-10-CM | POA: Diagnosis not present

## 2022-07-17 MED ORDER — BEVACIZUMAB CHEMO INJECTION 1.25MG/0.05ML SYRINGE FOR KALEIDOSCOPE
1.2500 mg | INTRAVITREAL | Status: AC | PRN
  Administered 2022-07-17: 1.25 mg via INTRAVITREAL

## 2022-07-17 NOTE — Assessment & Plan Note (Signed)
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  Also controlled on antivegF, q. 7-week evaluation and treatment interval

## 2022-07-17 NOTE — Progress Notes (Signed)
07/17/2022     CHIEF COMPLAINT Patient presents for  Chief Complaint  Patient presents with   Macular Degeneration      HISTORY OF PRESENT ILLNESS: Alexandria Moody is a 76 y.o. female who presents to the clinic today for:   HPI   7 weeks for DILATE OU, AVASTIN OCT OS. Pt stated no changes in vision since last visit.  Last edited by Silvestre Moment on 07/17/2022  1:07 PM.      Referring physician: Bubba Camp, FNP [address] [place],  [place] [address]  HISTORICAL INFORMATION:   Selected notes from the [facility]: No current outpatient medications on file. (Ophthalmic Drugs)   No current facility-administered medications for this visit. (Ophthalmic Drugs)   Current Outpatient Medications (Other)  Medication Sig   albuterol (VENTOLIN HFA) 108 (90 Base) MCG/ACT inhaler Inhale 2 puffs into the lungs every 6 (six) hours as needed for wheezing or shortness of breath.   amLODipine (NORVASC) 5 MG tablet Take 5 mg by mouth 2 (two) times daily.   aspirin 81 MG EC tablet Take 81 mg by mouth daily.   calcitRIOL (ROCALTROL) 0.25 MCG capsule Take 0.25 mcg by mouth daily.   Cholecalciferol 125 MCG (5000 UT) capsule Take 5,000 Units by mouth daily.   citalopram (CELEXA) 20 MG tablet Take 20 mg by mouth every morning.   fluocinonide (LIDEX) 0.05 % external solution Apply 1 application topically 2 (two) times daily as needed (scalp irritation).   gabapentin (NEURONTIN) 100 MG capsule Take 200 mg by mouth at bedtime.   hydrALAZINE (APRESOLINE) 50 MG tablet Take 50 mg by mouth in the morning and at bedtime.   hydroxychloroquine (PLAQUENIL) 200 MG tablet Take 200 mg by mouth daily.   insulin glargine, 1 Unit Dial, (TOUJEO SOLOSTAR) 300 UNIT/ML Solostar Pen Inject 32 Units into the skin every morning.   Insulin Pen Needle (BD PEN NEEDLE NANO 2ND GEN) 32G X 4 MM MISC daily.   isosorbide mononitrate (IMDUR) 120 MG 24 hr tablet Take 120 mg by mouth at  bedtime.   levothyroxine (SYNTHROID) 75 MCG tablet Take 75 mcg by mouth daily before breakfast.   linagliptin (TRADJENTA) 5 MG TABS tablet Take 5 mg by mouth every morning.   losartan (COZAAR) 25 MG tablet Take 25 mg by mouth every morning.   nitroGLYCERIN (NITROSTAT) 0.4 MG SL tablet Place 0.4 mg under the tongue every 5 (five) minutes as needed for chest pain.   Omega-3 Fatty Acids (FISH OIL) 1000 MG CAPS Take 1,000 mg by mouth in the morning and at bedtime.   omeprazole (PRILOSEC) 20 MG capsule Take 20 mg by mouth every morning.   ranolazine (RANEXA) 500 MG 12 hr tablet Take 500 mg by mouth 2 (two) times daily.   rosuvastatin (CRESTOR) 20 MG tablet Take 20 mg by mouth every morning.   torsemide (DEMADEX) 20 MG tablet Take 20 mg by mouth every morning.   No current facility-administered medications for this visit. (Other)      REVIEW OF SYSTEMS: ROS   Negative for: Constitutional, Gastrointestinal, Neurological, Skin, Genitourinary, Musculoskeletal, HENT, Endocrine, Cardiovascular, Eyes, Respiratory, Psychiatric, Allergic/Imm, Heme/Lymph Last edited by Silvestre Moment on 07/17/2022  1:07 PM.       ALLERGIES Allergies  Allergen Reactions   Tape     Tears skin    PAST MEDICAL HISTORY Past Medical History:  Diagnosis Date   Anemia of chronic renal failure  a.) recieving iron infusions + epoetin alfa-epbx (Retacrit)   Anginal pain (HCC)    Aortic atherosclerosis (HCC)    Arthritis    Atherosclerosis of native arteries of extremity with intermittent claudication ([place])    a.) ABI/TBI 06/24/2021: 0.95 RIGHT, 0.72 LEFT   Bilateral carotid artery disease ([place]) 07/25/2021   a.) carotid doppler 07/25/2021: < 40% RICA, < 34% LICA.   Carotid atherosclerosis, bilateral    CHF (congestive heart failure) ([place])    a.) TTE 05/12/2020: LVEF 55-60%; mild LA enlargement; trivial to mild MR; G1DD.   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   a.) smolding phase ANCA associated pauci immune   Heart murmur    HLD (hyperlipidemia)    Hypertension    Hypothyroidism    Long term current use of immunosuppressive drug    a.) hydroxychloriquine for ANCA (+) glomerulonephritis   Macular degeneration    PAD (peripheral artery disease) ([place])    Pneumonia 11/2021   S/P CABG x 3 2002   a.) LIMA-LAD, SVG-diagnoal, SVG-OM   Skin cancer    T2DM (type 2 diabetes mellitus) ([place])    TIA (transient ischemic attack)    Past Surgical History:  Procedure Laterality Date   ABDOMINAL HYSTERECTOMY     age 77   CAPD INSERTION N/A 02/03/2022   Procedure: LAPAROSCOPIC INSERTION CONTINUOUS AMBULATORY PERITONEAL DIALYSIS  (CAPD) CATHETER;  Surgeon: Jules Husbands, MD;  Location: [facility] ORS;  Service: General;  Laterality: N/A;  Provider requesting 1.5 hours / 90 minutes for procedure.   CATARACT EXTRACTION Bilateral 2014   CHOLECYSTECTOMY  2020   COLONOSCOPY     CORONARY ARTERY BYPASS GRAFT N/A 2002   Procedure: 3v CORONARY ARTERY ARTERY BYPASS GRAFT   EYE SURGERY Left 07/22/2020   Dr. Towanda Octave, Vit and [facility]    FAMILY HISTORY No family history on file.  SOCIAL HISTORY Social History   Tobacco Use   Smoking status: Former    Packs/day: 2.00    Years: 25.00    Total pack years: 50.00    Types: Cigarettes    Quit date: 2000    Years since quitting: 23.5    Passive exposure: Past   Smokeless tobacco: Never  Vaping Use   Vaping Use: Never used  Substance Use Topics   Alcohol use: Never   Drug use: Never         OPHTHALMIC EXAM:  Base Eye Exam     Visual Acuity (ETDRS)       Right Left   Dist cc 20/25 -1 +2 20/25 -2    Correction: Glasses         Tonometry (Tonopen, 1:11 PM)       Right Left    Pressure 13 14         Pupils       Pupils APD   Right PERRL None   Left PERRL None         Visual Fields       Left Right    Full Full         Extraocular Movement       Right Left    Full Full         Neuro/Psych  Oriented x3: Yes   Mood/Affect: Normal         Dilation     Both eyes: 1.0% Mydriacyl, 2.5% Phenylephrine @ 1:11 PM           Slit Lamp and Fundus Exam     External Exam       Right Left   External Normal Normal         Slit Lamp Exam       Right Left   Lids/Lashes Normal Normal   Conjunctiva/Sclera White and quiet White and quiet   Cornea Clear Clear   Anterior Chamber Deep and quiet Deep and quiet   Iris Round and reactive Round and reactive   Lens Centered posterior chamber intraocular lens Centered posterior chamber intraocular lens   Anterior Vitreous Normal Normal         Fundus Exam       Right Left   Posterior Vitreous  Clear vitrectomized   Disc  Normal   C/D Ratio  0.15   Macula  Microaneurysms, no macular thickening, no exudates, no clinically significant macular edema   Vessels  NPDR- Moderate   Periphery  Normal            IMAGING AND PROCEDURES  Imaging and Procedures for 07/17/22  OCT, Retina - OU - Both Eyes       Right Eye Quality was good. Scan locations included subfoveal. Central Foveal Thickness: 300. Progression has improved. Findings include abnormal foveal contour.   Left Eye Quality was good. Scan locations included subfoveal. Central Foveal Thickness: 244. Progression has improved. Findings include abnormal foveal contour.   Notes OD with minor intraretinal fluid, status post vitrectomy membrane peel  much less thickening, much less outer retinal disturbance as compared to epiretinal membrane present November 2021 vastly improved overall since surgical intervention right eye  OS with perifoveal thickening with intraretinal fluid and extension to the outer retina  inferonasal to FAZ consistent with a rap like lesion when correlated with clinical findings on macular evaluation, today with much less CME and intraretinal fluid post recent Avastin.  Vastly improved as compared to onset October 2022 today again at 7-week interval left eye, will need repeat injection Avastin today and examination again in 7 weeks, with less thickening OS TODAY      Intravitreal Injection, Pharmacologic Agent - OS - Left Eye       Time Out 07/17/2022. 1:30 PM. Confirmed correct patient, procedure, site, and patient consented.   Anesthesia Topical anesthesia was used. Anesthetic medications included Lidocaine 4%.   Procedure Preparation included 5% betadine to ocular surface, 10% betadine to eyelids, Ofloxacin . A 30 gauge needle was used.   Injection: 1.25 mg Bevacizumab 1.'25mg'$ /0.39m   Route: Intravitreal, Site: Left Eye   NDC: [id number]  Post-op Post injection exam found visual acuity of at least counting fingers. The patient tolerated the procedure well. There were no complications. The patient received written and verbal post procedure care education. Post injection medications included ocuflox.              ASSESSMENT/PLAN:  Severe nonproliferative diabetic retinopathy of left eye, with macular edema, associated with type 2 diabetes mellitus (HCC) OS much improved and maintain visual functioning at 7-week interval post Avastin for CSME related to severe NPDR.  Exudative age-related macular degeneration of left eye with active choroidal neovascularization (HCC) 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  Also controlled on antivegF, q. 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 Exudative age-related macular degeneration of left eye with active choroidal neovascularization (HCC)  H35.3221 OCT, Retina - OU - Both Eyes     Intravitreal Injection, Pharmacologic Agent - OS - Left Eye    Bevacizumab (AVASTIN) SOLN 1.25 mg    2. Severe nonproliferative diabetic retinopathy of left eye, with macular edema, associated with type 2 diabetes mellitus ([place])  V40.0867     3. Moderate nonproliferative diabetic retinopathy of right eye without macular edema associated with type 2 diabetes mellitus ([place])  Y19.5093       1.  OU stable moderate NPDR.  2.  OS perifoveal CNVM much improved as compared to October 2022.  Currently on 7-week interval.  Small region nasal to FAZ, needs treatment today again at 7-week interval maintain 6 to 7-week interval.  3.  Ophthalmic Meds Ordered this visit:  Meds ordered this encounter  Medications   Bevacizumab (AVASTIN) SOLN 1.25 mg       Return in about 7 weeks (around 09/04/2022) for dilate, OS, AVASTIN OCT.  There are no Patient Instructions on file for this visit.   Explained the diagnoses, plan, and follow up with the patient and they expressed understanding.  Patient expressed understanding of the importance of proper follow up care.   Clent Demark Knight Oelkers M.D. Diseases & Surgery of the Retina and Vitreous [facility] [place] 07/17/22     Abbreviations: M myopia (nearsighted); A astigmatism; H hyperopia (farsighted); P presbyopia; Mrx spectacle prescription;  CTL contact lenses; OD right eye; OS left eye; OU both eyes  XT exotropia; ET esotropia; PEK punctate epithelial keratitis; PEE punctate epithelial erosions; DES dry eye syndrome; MGD meibomian gland dysfunction; ATs artificial tears; PFAT's preservative free artificial tears; [place] nuclear sclerotic cataract; PSC posterior subcapsular cataract; ERM epi-retinal membrane; PVD posterior vitreous detachment; RD retinal detachment; DM diabetes mellitus; DR diabetic retinopathy; NPDR non-proliferative diabetic retinopathy; PDR proliferative diabetic retinopathy; CSME clinically significant macular edema; DME diabetic  macular edema; dbh dot blot hemorrhages; CWS cotton wool spot; POAG primary open angle glaucoma; C/D cup-to-disc ratio; HVF humphrey visual field; GVF goldmann visual field; OCT optical coherence tomography; IOP intraocular pressure; BRVO Branch retinal vein occlusion; CRVO central retinal vein occlusion; CRAO central retinal artery occlusion; BRAO branch retinal artery occlusion; RT retinal tear; SB scleral buckle; PPV pars plana vitrectomy; VH Vitreous hemorrhage; PRP panretinal laser photocoagulation; IVK intravitreal kenalog; VMT vitreomacular traction; MH Macular hole;  NVD neovascularization of the disc; NVE neovascularization elsewhere; AREDS age related eye disease study; ARMD age related macular degeneration; POAG primary open angle glaucoma; EBMD epithelial/anterior basement membrane dystrophy; ACIOL anterior chamber intraocular lens; IOL intraocular lens; PCIOL posterior chamber intraocular lens; Phaco/IOL phacoemulsification with intraocular lens placement; [place] photorefractive keratectomy; LASIK laser assisted in situ keratomileusis; HTN hypertension; DM diabetes mellitus; COPD chronic obstructive pulmonary disease

## 2022-07-17 NOTE — Assessment & Plan Note (Signed)
OS much improved and maintain visual functioning at 7-week interval post Avastin for CSME related to severe NPDR.

## 2022-07-17 NOTE — Assessment & Plan Note (Signed)
No signs of progression of NPDR

## 2022-08-14 ENCOUNTER — Encounter: Payer: Self-pay | Admitting: Surgery

## 2022-08-14 ENCOUNTER — Ambulatory Visit: Payer: Medicare PPO | Admitting: Surgery

## 2022-08-14 VITALS — BP 127/63 | HR 60 | Temp 98.1°F | Ht 63.0 in | Wt 148.0 lb

## 2022-08-14 DIAGNOSIS — R221 Localized swelling, mass and lump, neck: Secondary | ICD-10-CM | POA: Diagnosis not present

## 2022-08-14 NOTE — Patient Instructions (Addendum)
Your Ultrasound is scheduled for 08/22/2022 at 2:30 pm (arrive by 2:15 pm) @ [facility] [address].  If you have any concerns or questions, please feel free to call our office. See follow appointment below.

## 2022-08-15 ENCOUNTER — Encounter: Payer: Self-pay | Admitting: Surgery

## 2022-08-15 NOTE — Progress Notes (Signed)
Outpatient Surgical Follow Up  08/15/2022  Alexandria Moody is an 76 y.o. female.   Chief Complaint  Patient presents with   New Patient (Initial Visit)    Neck mass    HPI: Alexandria Moody is a 76 year old female with history of left carotid endarterectomy, coronary artery disease end-stage renal disease using peritoneal dialysis catheter since March.  Please note that I have placed that catheter and she is very happy with it.  No abdominal symptoms.  She recently went to her dentist who did a head and neck examination finding a left neck mass.  She was not aware of the mass.  She denies any symptoms.  No swallowing issues.  No fevers no chills no B type symptoms.  Did have recent TSH that was normal.  CBC shows chronic anemia with a hemoglobin of 8.3.  She is diabetic and her hemoglobin A1c was 9.4 a month ago  Past Medical History:  Diagnosis Date   Anemia of chronic renal failure    a.) recieving iron infusions + epoetin alfa-epbx (Retacrit)   Anginal pain (HCC)    Aortic atherosclerosis (HCC)    Arthritis    Atherosclerosis of native arteries of extremity with intermittent claudication ([place])    a.) ABI/TBI 06/24/2021: 0.95 RIGHT, 0.72 LEFT   Bilateral carotid artery disease ([place]) 07/25/2021   a.) carotid doppler 07/25/2021: < 32% RICA, < 35% LICA.   Carotid atherosclerosis, bilateral    CHF (congestive heart failure) ([place])    a.) TTE 05/12/2020: LVEF 55-60%; mild LA enlargement; trivial to mild MR; G1DD.   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   a.) smolding phase ANCA associated pauci immune   Heart murmur    HLD (hyperlipidemia)    Hypertension    Hypothyroidism    Long term current use of immunosuppressive  drug    a.) hydroxychloriquine for ANCA (+) glomerulonephritis   Macular degeneration    PAD (peripheral artery disease) ([place])    Pneumonia 11/2021   S/P CABG x 3 2002   a.) LIMA-LAD, SVG-diagnoal, SVG-OM   Skin cancer    T2DM (type 2 diabetes mellitus) ([place])    TIA (transient ischemic attack)     Past Surgical History:  Procedure Laterality Date   ABDOMINAL HYSTERECTOMY     age 89   CAPD INSERTION N/A 02/03/2022   Procedure: LAPAROSCOPIC INSERTION CONTINUOUS AMBULATORY PERITONEAL DIALYSIS  (CAPD) CATHETER;  Surgeon: Jules Husbands, MD;  Location: [facility] ORS;  Service: General;  Laterality: N/A;  Provider requesting 1.5 hours / 90 minutes for procedure.   CATARACT EXTRACTION Bilateral 2014   CHOLECYSTECTOMY  2020   COLONOSCOPY     CORONARY ARTERY BYPASS GRAFT N/A 2002   Procedure: 3v CORONARY ARTERY ARTERY BYPASS GRAFT   EYE SURGERY Left 07/22/2020   Dr. Towanda Octave, Vit and Marta Lamas    History reviewed. No pertinent family history.  Social History:  reports that she quit smoking about 23 years ago. Her smoking use included cigarettes. She has a 50.00 pack-year smoking history. She has been exposed to tobacco smoke. She has never used smokeless tobacco. She reports that she does not drink alcohol and does not use drugs.  Allergies:  Allergies  Allergen Reactions   Tape  Tears skin    Medications reviewed.    ROS Full ROS performed and is otherwise negative other than what is stated in HPI   BP 127/63   Pulse 60   Temp 98.1 F (36.7 C) (Oral)   Ht '5\' 3"'$  (1.6 m)   Wt 148 lb (67.1 kg)   SpO2 95%   BMI 26.22 kg/m   Physical Exam Vitals and nursing note reviewed. Exam conducted with a chaperone present.  Constitutional:      General: She is not in acute distress.    Appearance: Normal appearance. She is normal weight. She is not ill-appearing.  Eyes:     General: No scleral icterus.       Right eye: No discharge.        Left eye: No discharge.  Neck:      Vascular: No carotid bruit.     Comments: There is evidence of a 2 cm mobile nodule level 2 of the left neck, normal thyroid. Carotid endarterectomy scar on the left side Cardiovascular:     Rate and Rhythm: Normal rate and regular rhythm.     Heart sounds: No murmur heard.    No friction rub.  Pulmonary:     Effort: Pulmonary effort is normal. No respiratory distress.     Breath sounds: Normal breath sounds. No stridor. No wheezing or rhonchi.  Abdominal:     General: Abdomen is flat. There is no distension.     Palpations: Abdomen is soft. There is no mass.     Tenderness: There is no abdominal tenderness. There is no guarding or rebound.     Hernia: No hernia is present.     Comments: PD Catheter in place.  No infection  Musculoskeletal:        General: No swelling or tenderness. Normal range of motion.     Cervical back: Normal range of motion and neck supple. No rigidity or tenderness.  Lymphadenopathy:     Cervical: No cervical adenopathy.  Skin:    General: Skin is warm and dry.     Capillary Refill: Capillary refill takes less than 2 seconds.  Neurological:     General: No focal deficit present.     Mental Status: She is alert and oriented to person, place, and time.  Psychiatric:        Mood and Affect: Mood normal.        Behavior: Behavior normal.        Thought Content: Thought content normal.        Judgment: Judgment normal.     Assessment/Plan: 76 year old female with a left neck mass.  Differential will include seroma associated to prior carotid endarterectomy versus thyroid versus metastatic disease.  First order of business is to obtain imaging studies.  We will obtain ultrasound of the neck followed by ultrasound-guided FNA biopsy.  Discussed with the patient in detail about her disease process.  She understands.  We will first need to obtain a diagnosis before figuring things out.  No need for emergent surgical intervention at this time Please note that I  spent greater 40 minutes in this encounter including personally reviewing imaging studies, placing orders, counseling the patient and coordinating her care as well as performing appropriate documentation   Caroleen Hamman, MD [place] Surgeon

## 2022-08-22 ENCOUNTER — Ambulatory Visit
Admission: RE | Admit: 2022-08-22 | Discharge: 2022-08-22 | Disposition: A | Discharge status: Home / Self Care | Payer: Medicare PPO | Source: Ambulatory Visit | Attending: Surgery | Admitting: Surgery

## 2022-08-22 DIAGNOSIS — R221 Localized swelling, mass and lump, neck: Secondary | ICD-10-CM | POA: Diagnosis not present

## 2022-08-23 ENCOUNTER — Telehealth: Payer: Self-pay

## 2022-08-23 NOTE — Telephone Encounter (Signed)
Notified patient as instructed, patient pleased. Discussed follow-up appointments, patient agrees  

## 2022-08-23 NOTE — Telephone Encounter (Signed)
-----   Message from Jules Husbands, MD sent at 08/23/2022  3:22 PM EDT ----- Please let her know U/S showed bening lymph nodes, f/u w me ----- Message ----- From: Interface, Rad Results In Sent: 08/23/2022   2:16 PM EDT To: Jules Husbands, MD

## 2022-08-28 ENCOUNTER — Telehealth: Payer: Self-pay

## 2022-08-28 ENCOUNTER — Encounter: Payer: Self-pay | Admitting: Surgery

## 2022-08-28 ENCOUNTER — Ambulatory Visit: Payer: Medicare PPO | Admitting: Surgery

## 2022-08-28 ENCOUNTER — Ambulatory Visit (INDEPENDENT_AMBULATORY_CARE_PROVIDER_SITE_OTHER): Payer: Medicare PPO | Admitting: Gastroenterology

## 2022-08-28 ENCOUNTER — Encounter: Payer: Self-pay | Admitting: Gastroenterology

## 2022-08-28 VITALS — BP 150/68 | HR 61 | Ht 63.0 in | Wt 150.0 lb

## 2022-08-28 VITALS — BP 145/63 | HR 61 | Temp 98.0°F | Wt 149.8 lb

## 2022-08-28 DIAGNOSIS — Z86010 Personal history of colonic polyps: Secondary | ICD-10-CM | POA: Diagnosis not present

## 2022-08-28 DIAGNOSIS — R0989 Other specified symptoms and signs involving the circulatory and respiratory systems: Secondary | ICD-10-CM | POA: Diagnosis not present

## 2022-08-28 DIAGNOSIS — R1314 Dysphagia, pharyngoesophageal phase: Secondary | ICD-10-CM | POA: Diagnosis not present

## 2022-08-28 DIAGNOSIS — R221 Localized swelling, mass and lump, neck: Secondary | ICD-10-CM

## 2022-08-28 MED ORDER — PEG 3350-KCL-NA BICARB-NACL 420 G PO SOLR: 4000.0000 mL | Freq: Once | ORAL | 0 refills | Status: AC

## 2022-08-28 NOTE — Patient Instructions (Addendum)
Your Ultrasound is scheduled for 08/31/2022 at 5:30 pm (arrive by 5 pm) @ [facility].    A referral has been placed with Dr. Lucky Cowboy at [facility]. They will call you with an appointment.    If you have any concerns or questions, please feel free to call our office.

## 2022-08-28 NOTE — Progress Notes (Signed)
Gastroenterology Consultation  Referring Provider:     Margaretha Sheffield, MD Primary Care Physician:  Bubba Camp, FNP Primary Gastroenterologist:  Dr. Allen Norris     Reason for Consultation:     Dysphagia        HPI:   Alexandria Moody is a 76 y.o. y/o female referred for consultation & management of dysphagia by Dr. Bubba Camp, [place].  This patient comes in today after having a history of a colonoscopy back in [place] in 2018 with 9 polyps at that time and recommended repeat colonoscopy in 3 years.  The patient was seen at the [facility] clinic for a possible colonoscopy but it appears that the patient canceled that procedure.  The patient then was seen by ENT for dysphagia.  The patient underwent a modified barium swallow with speech pathology with recommendations of:   Patient presents with grossly functional oropharyngeal swallowing and mild pharyngoesophageal dysphagia. Oral stage is noted for adequate bolus control, manipulation and transfer. Swallow initiation is timely at the level of the base of tongue. Pharyngeal stage is characterized by adequate tongue base retraction, hyolaryngeal excursion and pharyngeal constriction. There is no laryngeal penetration or aspiration. Pharyngoesophageal phase of the swallow is noted for reduced amplitude of opening of the pharyngoesophageal segment due to small cricopharyngeal bar (confirmed by the radiologist) and anterior osteophytes. Pt was noted to thrust head forward slightly. Given absence of lingual weakness, suspect this maneuver intended to generate additional pressure to propel bolus through the cervical esophagus. There is slowed clearance through the cervical esophagus with solids >liquids as well as small amount of retrograde flow, trace CP segment residue. Pt sensate to residue; second swallow or liquid wash aided in reducing this. A limited scan of the esophagus noted for appearance of slow clearance in thoracic esophagus. Consider further  esophageal assesment such as upper endoscopy or manometry. No further skilled ST recommended.   The radiological report of the barium swallow showed:  IMPRESSION: Small cricopharyngeal bar otherwise unremarkable modified barium swallow. Please refer to the Speech Pathologists report for complete details and recommendations.  The patient denies any abdominal pain nausea vomiting fevers chills black stools or bloody stools.  She also denies any unexplained weight loss.  Past Medical History:  Diagnosis Date   Anemia of chronic renal failure    a.) recieving iron infusions + epoetin alfa-epbx (Retacrit)   Anginal pain (HCC)    Aortic atherosclerosis (HCC)    Arthritis    Atherosclerosis of native arteries of extremity with intermittent claudication ([place])    a.) ABI/TBI 06/24/2021: 0.95 RIGHT, 0.72 LEFT   Bilateral carotid artery disease ([place]) 07/25/2021   a.) carotid doppler 07/25/2021: < 24% RICA, < 09% LICA.   Carotid atherosclerosis, bilateral    CHF (congestive heart failure) ([place])    a.) TTE 05/12/2020: LVEF 55-60%; mild LA enlargement; trivial to mild MR; G1DD.   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   a.) smolding phase ANCA associated pauci immune   Heart murmur    HLD (hyperlipidemia)    Hypertension    Hypothyroidism    Long term current use of immunosuppressive drug    a.) hydroxychloriquine for ANCA (+) glomerulonephritis   Macular degeneration    PAD (  peripheral artery disease) ([place])    Pneumonia 11/2021   S/P CABG x 3 2002   a.) LIMA-LAD, SVG-diagnoal, SVG-OM   Skin cancer    T2DM (type 2 diabetes mellitus) ([place])    TIA (transient ischemic attack)     Past Surgical History:   Procedure Laterality Date   ABDOMINAL HYSTERECTOMY     age 58   CAPD INSERTION N/A 02/03/2022   Procedure: LAPAROSCOPIC INSERTION CONTINUOUS AMBULATORY PERITONEAL DIALYSIS  (CAPD) CATHETER;  Surgeon: Jules Husbands, MD;  Location: [facility] ORS;  Service: General;  Laterality: N/A;  Provider requesting 1.5 hours / 90 minutes for procedure.   CATARACT EXTRACTION Bilateral 2014   CHOLECYSTECTOMY  2020   COLONOSCOPY     CORONARY ARTERY BYPASS GRAFT N/A 2002   Procedure: 3v CORONARY ARTERY ARTERY BYPASS GRAFT   EYE SURGERY Left 07/22/2020   Dr. Towanda Octave, [facility] and Marta Lamas    Prior to Admission medications   Medication Sig Start Date End Date Taking? Authorizing Provider  albuterol (VENTOLIN HFA) 108 (90 Base) MCG/ACT inhaler Inhale 2 puffs into the lungs every 6 (six) hours as needed for wheezing or shortness of breath. 07/22/18   [provider]  amLODipine (NORVASC) 5 MG tablet Take 5 mg by mouth 2 (two) times daily. 10/03/19   [provider]  aspirin 81 MG EC tablet Take 81 mg by mouth daily.    [provider]  calcitRIOL (ROCALTROL) 0.25 MCG capsule Take 0.25 mcg by mouth daily. 07/20/15   [provider]  Cholecalciferol 125 MCG (5000 UT) capsule Take 5,000 Units by mouth daily.    [provider]  citalopram (CELEXA) 20 MG tablet Take 20 mg by mouth every morning. 02/26/19   [provider]  fluocinonide (LIDEX) 0.05 % external solution Apply 1 application topically 2 (two) times daily as needed (scalp irritation).    [provider]  hydrALAZINE (APRESOLINE) 50 MG tablet Take 50 mg by mouth in the morning and at bedtime. 05/18/20   [provider]  hydroxychloroquine (PLAQUENIL) 200 MG tablet Take 200 mg by mouth daily. 09/27/21   [provider]  insulin glargine, 1 Unit Dial, (TOUJEO SOLOSTAR) 300 UNIT/ML Solostar Pen Inject 32 Units into the skin every morning. 03/26/20   [provider]  Insulin Pen Needle  (BD PEN NEEDLE NANO 2ND GEN) 32G X 4 MM MISC daily. 05/05/21   [provider]  isosorbide mononitrate (IMDUR) 120 MG 24 hr tablet Take 120 mg by mouth at bedtime. 04/23/20   [provider]  levothyroxine (SYNTHROID) 75 MCG tablet Take 75 mcg by mouth daily before breakfast. 04/05/20   [provider]  linagliptin (TRADJENTA) 5 MG TABS tablet Take 5 mg by mouth every morning. 03/09/20   [provider]  losartan (COZAAR) 25 MG tablet Take 25 mg by mouth every morning. 03/03/21   [provider]  nitroGLYCERIN (NITROSTAT) 0.4 MG SL tablet Place 0.4 mg under the tongue every 5 (five) minutes as needed for chest pain. 09/10/20   [provider]  Omega-3 Fatty Acids (FISH OIL) 1000 MG CAPS Take 1,000 mg by mouth in the morning and at bedtime.    [provider]  omeprazole (PRILOSEC) 20 MG capsule Take 20 mg by mouth every morning. 07/05/18   [provider]  ranolazine (RANEXA) 500 MG 12 hr tablet Take 500 mg by mouth 2 (two) times daily. 04/05/20   [provider]  rosuvastatin (CRESTOR) 20 MG tablet Take  20 mg by mouth every morning. 03/31/21   [provider]  torsemide (DEMADEX) 20 MG tablet Take 20 mg by mouth every morning. 02/09/21 02/09/22  [provider]    No family history on file.   Social History   Tobacco Use   Smoking status: Former    Packs/day: 2.00    Years: 25.00    Total pack years: 50.00    Types: Cigarettes    Quit date: 2000    Years since quitting: 23.7    Passive exposure: Past   Smokeless tobacco: Never  Vaping Use   Vaping Use: Never used  Substance Use Topics   Alcohol use: Never   Drug use: Never    Allergies as of 08/28/2022 - Review Complete 08/15/2022  Allergen Reaction Noted   Tape  07/13/2020    Review of Systems:    All systems reviewed and negative except where noted in HPI.   Physical Exam:  There were no vitals taken for this visit. No LMP recorded.  Patient is postmenopausal. General:   Alert,  Well-developed, well-nourished, pleasant and cooperative in NAD Head:  Normocephalic and atraumatic. Eyes:  Sclera clear, no icterus.   Conjunctiva pink. Ears:  Normal auditory acuity. Neck:  Supple; no masses or thyromegaly. Lungs:  Respirations even and unlabored.  Clear throughout to auscultation.   No wheezes, crackles, or rhonchi. No acute distress. Heart:  Regular rate and rhythm; loud systolic murmurs without any clicks, rubs, or gallops. Abdomen:  Normal bowel sounds.  No bruits.  Soft, non-tender and non-distended without masses, hepatosplenomegaly or hernias noted.  No guarding or rebound tenderness.  Negative Carnett sign.   Rectal:  Deferred.  Pulses:  Normal pulses noted. Extremities:  No clubbing or edema.  No cyanosis. Neurologic:  Alert and oriented x3;  grossly normal neurologically. Skin:  Intact without significant lesions or rashes.  No jaundice. Lymph Nodes:  No significant cervical adenopathy. Psych:  Alert and cooperative. Normal mood and affect.  Imaging Studies: US Soft Tissue Head/Neck  Result Date: 08/23/2022 CLINICAL DATA:  Left neck mass EXAM: ULTRASOUND OF HEAD/NECK SOFT TISSUES TECHNIQUE: Ultrasound examination of the head and neck soft tissues was performed in the area of clinical concern. COMPARISON:  None Available. FINDINGS: Grayscale and color Doppler evaluation of the area interest demonstrates a few nonenlarged lymph nodes with preserved fatty hila. IMPRESSION: Nonenlarged lymph nodes. Electronically Signed   By: Macy Mis M.D.   On: 08/23/2022 14:14    Assessment and Plan:   Alexandria Moody is a 76 y.o. y/o female who comes in today with a history of colon polyps and is due for repeat colonoscopy.  The patient also had a modified barium swallow with suggestion for a GI evaluation of the esophagus due to slow transit of the bolus to the esophagus.  The patient will have an upper endoscopy at the time of her  colonoscopy.  The patient has been told that there was a osteophyte seen pushing in on the esophagus at the level of the cricopharyngeal bar.  The patient has been explained the plan and agrees with it.    Lucilla Lame, MD. [facility]    Note: This dictation was prepared with Dragon dictation along with smaller phrase technology. Any transcriptional errors that result from this process are unintentional.

## 2022-08-28 NOTE — Addendum Note (Signed)
Addended by: Lurlean Nanny on: 08/28/2022 04:29 PM   Modules accepted: Orders

## 2022-08-30 ENCOUNTER — Encounter: Payer: Self-pay | Admitting: Surgery

## 2022-08-30 NOTE — Progress Notes (Signed)
Outpatient Surgical Follow Up  08/30/2022  Alexandria Moody is an 76 y.o. female.   Chief Complaint  Patient presents with   Follow-up    Left neck mass    HPI: Alexandria Moody is a 76 year old female with history of bilateral carotid endarterectomy, coronary artery disease end-stage renal disease using peritoneal dialysis catheter since March.  Please note that I have placed that catheter and she is very happy with it.  No abdominal symptoms.  She recently went to her dentist who did a head and neck examination finding a left neck mass.  She was not aware of the mass.  She denies any symptoms.  No swallowing issues.  No fevers no chills no B type symptoms.  Did have recent TSH that was normal.  CBC shows chronic anemia with a hemoglobin of 8.3.  She is diabetic and her hemoglobin A1c was 9.4 a month ago  She completed an ultrasound that I personally reviewed showing that the neck mass was in lymph node and no need for biopsy. She is doing fine and having no symptoms. She has no neurological symptoms.  No amaurosis fugax,  Past Medical History:  Diagnosis Date   Anemia of chronic renal failure    a.) recieving iron infusions + epoetin alfa-epbx (Retacrit)   Anginal pain (HCC)    Aortic atherosclerosis (HCC)    Arthritis    Atherosclerosis of native arteries of extremity with intermittent claudication ([place])    a.) ABI/TBI 06/24/2021: 0.95 RIGHT, 0.72 LEFT   Bilateral carotid artery disease ([place]) 07/25/2021   a.) carotid doppler 07/25/2021: < 99% RICA, < 35% LICA.   Carotid atherosclerosis, bilateral    CHF (congestive heart failure) ([place])    a.) TTE 05/12/2020: LVEF 55-60%; mild LA enlargement; trivial to mild MR; G1DD.   COPD (chronic obstructive pulmonary disease) (HCC)    Coronary artery disease    a.) 3v CABG 2002 --> LIMA-LAD, SVG-diagonal, SVG-OM   ESRD (end stage renal disease) (HCC)    GERD (gastroesophageal reflux disease)    Glomerulonephritis due to antineutrophil cytoplasmic  antibody (ANCA) positive vasculitis ([place]) 09/11/2012   a.) smolding phase ANCA associated pauci immune   Heart murmur    HLD (hyperlipidemia)    Hypertension    Hypothyroidism    Long term current use of immunosuppressive drug    a.) hydroxychloriquine for ANCA (+) glomerulonephritis   Macular degeneration    PAD (peripheral artery disease) ([place])    Pneumonia 11/2021   S/P CABG x 3 2002   a.) LIMA-LAD, SVG-diagnoal, SVG-OM   Skin cancer    T2DM (type 2 diabetes mellitus) ([place])    TIA (transient ischemic attack)     Past Surgical History:  Procedure Laterality Date   ABDOMINAL HYSTERECTOMY     age 70   CAPD INSERTION N/A 02/03/2022   Procedure: LAPAROSCOPIC INSERTION CONTINUOUS AMBULATORY PERITONEAL DIALYSIS  (CAPD) CATHETER;  Surgeon: Jules Husbands, MD;  Location: [facility] ORS;  Service: General;  Laterality: N/A;  Provider requesting 1.5 hours / 90 minutes for procedure.   CATARACT EXTRACTION Bilateral 2014   CHOLECYSTECTOMY  2020   COLONOSCOPY     CORONARY ARTERY BYPASS GRAFT N/A 2002   Procedure: 3v CORONARY ARTERY ARTERY BYPASS GRAFT   EYE SURGERY Left 07/22/2020   Dr. Towanda Octave, [facility] and [facility]    History reviewed. No pertinent family history.  Social History:  reports that she quit smoking about 23 years ago. Her smoking use included cigarettes. She has a 50.00 pack-year  smoking history. She has been exposed to tobacco smoke. She has never used smokeless tobacco. She reports that she does not drink alcohol and does not use drugs.  Allergies:  Allergies  Allergen Reactions   Tape     Tears skin    Medications reviewed.    ROS Full ROS performed and is otherwise negative other than what is stated in HPI   BP (!) 145/63   Pulse 61   Temp 98 F (36.7 C) (Oral)   Wt 149 lb 12.8 oz (67.9 kg)   SpO2 95%   BMI 26.54 kg/m   Physical Exam Vitals and nursing note reviewed. Exam conducted with a chaperone present.  Constitutional:      General: She is not in  acute distress.    Appearance: Normal appearance. She is normal weight. She is not ill-appearing.  Eyes:     General: No scleral icterus.       Right eye: No discharge.        Left eye: No discharge.  Neck:     Vascular: Today I did notice a loud carotid bruit on the right side    Comments: There is evidence of a 2 cm mobile nodule level 2 of the left neck, normal thyroid. Carotid endarterectomy scar on the left side Cardiovascular:     Rate and Rhythm: Normal rate and regular rhythm.     Heart sounds: No murmur heard.    No friction rub.  Pulmonary:     Effort: Pulmonary effort is normal. No respiratory distress.     Breath sounds: Normal breath sounds. No stridor. No wheezing or rhonchi.  Abdominal:     General: Abdomen is flat. There is no distension.     Palpations: Abdomen is soft. There is no mass.     Tenderness: There is no abdominal tenderness. There is no guarding or rebound.     Hernia: No hernia is present.     Comments: PD Catheter in place.  No infection  Musculoskeletal:        General: No swelling or tenderness. Normal range of motion.     Cervical back: Normal range of motion and neck supple. No rigidity or tenderness.  Lymphadenopathy:     Cervical: No cervical adenopathy.  Skin:    General: Skin is warm and dry.     Capillary Refill: Capillary refill takes less than 2 seconds.  Neurological:     General: No focal deficit present.     Mental Status: She is alert and oriented to person, place, and time.  Psychiatric:        Mood and Affect: Mood normal.        Behavior: Behavior normal.        Thought Content: Thought content normal.        Judgment: Judgment normal.     Assessment/Plan: 76 year old female with a left neck mass.  Seems to be normal lymph node.  Discussed with the patient detail about ultrasound results.  If it enlarges she knows to come back and we may have to do a CT scan of the neck.  Today I noticed a loud right carotid bruit.  Upon  interrogation of her she has not had any duplex of the carotid vessels in a while.  She had bilateral carotid endarterectomy in [place] more than a decade ago.  We will go ahead and get her established with vascular regarding carotid disease and do a duplex study of both carotids. She is  not having any symptoms associated with a carotid bruit. I will see her back on a as needed basis. Please note that I spent greater 40 minutes in this encounter including personally reviewing imaging studies, placing orders, counseling the patient and coordinating her care as well as performing appropriate documentation  Caroleen Hamman, MD [facility]

## 2022-08-31 ENCOUNTER — Ambulatory Visit
Admission: RE | Admit: 2022-08-31 | Discharge: 2022-08-31 | Disposition: A | Discharge status: Home / Self Care | Payer: Medicare PPO | Source: Ambulatory Visit | Attending: Surgery | Admitting: Surgery

## 2022-08-31 DIAGNOSIS — R0989 Other specified symptoms and signs involving the circulatory and respiratory systems: Secondary | ICD-10-CM | POA: Insufficient documentation

## 2022-09-04 ENCOUNTER — Encounter (INDEPENDENT_AMBULATORY_CARE_PROVIDER_SITE_OTHER): Payer: Self-pay | Admitting: Ophthalmology

## 2022-09-04 ENCOUNTER — Ambulatory Visit (INDEPENDENT_AMBULATORY_CARE_PROVIDER_SITE_OTHER): Payer: Medicare PPO | Admitting: Ophthalmology

## 2022-09-04 DIAGNOSIS — E113412 Type 2 diabetes mellitus with severe nonproliferative diabetic retinopathy with macular edema, left eye: Secondary | ICD-10-CM | POA: Diagnosis not present

## 2022-09-04 DIAGNOSIS — H353221 Exudative age-related macular degeneration, left eye, with active choroidal neovascularization: Secondary | ICD-10-CM

## 2022-09-04 MED ORDER — BEVACIZUMAB CHEMO INJECTION 1.25MG/0.05ML SYRINGE FOR KALEIDOSCOPE
2.5000 mg | INTRAVITREAL | Status: AC | PRN
  Administered 2022-09-04: 2.5 mg via INTRAVITREAL

## 2022-09-04 NOTE — Assessment & Plan Note (Signed)
Much improved OS on 7  weeks maintain good acuity, will repeat injection today and extended toward next

## 2022-09-04 NOTE — Assessment & Plan Note (Signed)
No signs of active macular edema

## 2022-09-04 NOTE — Progress Notes (Signed)
09/04/2022     CHIEF COMPLAINT Patient presents for  Chief Complaint  Patient presents with   Macular Degeneration      HISTORY OF PRESENT ILLNESS: Alexandria Moody is a 76 y.o. female who presents to the clinic today for:   HPI   Age related macular degeneration of left eye 7 weeks dilate os avastin oct Pt states her vision has been stable Pt denies any new floaters or FOL Last edited by Morene Rankins, CMA on 09/04/2022  2:04 PM.      Referring physician: Bubba Camp, FNP [address] [place],  [place] [address]  HISTORICAL INFORMATION:   Selected notes from the [facility]: No current outpatient medications on file. (Ophthalmic Drugs)   No current facility-administered medications for this visit. (Ophthalmic Drugs)   Current Outpatient Medications (Other)  Medication Sig   albuterol (VENTOLIN HFA) 108 (90 Base) MCG/ACT inhaler Inhale 2 puffs into the lungs every 6 (six) hours as needed for wheezing or shortness of breath.   amLODipine (NORVASC) 5 MG tablet Take 5 mg by mouth 2 (two) times daily.   aspirin 81 MG EC tablet Take 81 mg by mouth daily.   calcitRIOL (ROCALTROL) 0.25 MCG capsule Take 0.25 mcg by mouth daily.   Cholecalciferol 125 MCG (5000 UT) capsule Take 5,000 Units by mouth daily.   citalopram (CELEXA) 20 MG tablet Take 20 mg by mouth every morning.   fluocinonide (LIDEX) 0.05 % external solution Apply 1 application topically 2 (two) times daily as needed (scalp irritation).   hydrALAZINE (APRESOLINE) 50 MG tablet Take 50 mg by mouth in the morning and at bedtime.   hydroxychloroquine (PLAQUENIL) 200 MG tablet Take 200 mg by mouth daily.   insulin glargine, 1 Unit Dial, (TOUJEO SOLOSTAR) 300 UNIT/ML Solostar Pen Inject 32 Units into the skin every morning.   Insulin Pen Needle (BD PEN NEEDLE NANO 2ND GEN) 32G X 4 MM MISC daily.   isosorbide mononitrate (IMDUR) 120 MG 24 hr tablet Take 120 mg by mouth at  bedtime.   levothyroxine (SYNTHROID) 75 MCG tablet Take 75 mcg by mouth daily before breakfast.   linagliptin (TRADJENTA) 5 MG TABS tablet Take 5 mg by mouth every morning.   losartan (COZAAR) 25 MG tablet Take 25 mg by mouth every morning.   nitroGLYCERIN (NITROSTAT) 0.4 MG SL tablet Place 0.4 mg under the tongue every 5 (five) minutes as needed for chest pain.   Omega-3 Fatty Acids (FISH OIL) 1000 MG CAPS Take 1,000 mg by mouth in the morning and at bedtime.   omeprazole (PRILOSEC) 20 MG capsule Take 20 mg by mouth every morning.   ranolazine (RANEXA) 500 MG 12 hr tablet Take 500 mg by mouth 2 (two) times daily.   rosuvastatin (CRESTOR) 20 MG tablet Take 20 mg by mouth every morning.   torsemide (DEMADEX) 20 MG tablet Take 20 mg by mouth every morning.   No current facility-administered medications for this visit. (Other)      REVIEW OF SYSTEMS: ROS   Negative for: Constitutional, Gastrointestinal, Neurological, Skin, Genitourinary, Musculoskeletal, HENT, Endocrine, Cardiovascular, Eyes, Respiratory, Psychiatric, Allergic/Imm, Heme/Lymph Last edited by Orene Desanctis D, CMA on 09/04/2022  2:04 PM.       ALLERGIES Allergies  Allergen Reactions   Tape     Tears skin    PAST MEDICAL HISTORY Past Medical History:  Diagnosis Date   Anemia of chronic renal failure  a.) recieving iron infusions + epoetin alfa-epbx (Retacrit)   Anginal pain (HCC)    Aortic atherosclerosis (HCC)    Arthritis    Atherosclerosis of native arteries of extremity with intermittent claudication ([place])    a.) ABI/TBI 06/24/2021: 0.95 RIGHT, 0.72 LEFT   Bilateral carotid artery disease ([place]) 07/25/2021   a.) carotid doppler 07/25/2021: < 08% RICA, < 65% LICA.   Carotid atherosclerosis, bilateral    CHF (congestive heart failure) ([place])    a.) TTE 05/12/2020: LVEF 55-60%; mild LA enlargement; trivial to mild MR; G1DD.   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   a.) smolding phase ANCA associated pauci immune   Heart murmur    HLD (hyperlipidemia)    Hypertension    Hypothyroidism    Long term current use of immunosuppressive drug    a.) hydroxychloriquine for ANCA (+) glomerulonephritis   Macular degeneration    PAD (peripheral artery disease) ([place])    Pneumonia 11/2021   S/P CABG x 3 2002   a.) LIMA-LAD, SVG-diagnoal, SVG-OM   Skin cancer    T2DM (type 2 diabetes mellitus) ([place])    TIA (transient ischemic attack)    Past Surgical History:  Procedure Laterality Date   ABDOMINAL HYSTERECTOMY     age 74   CAPD INSERTION N/A 02/03/2022   Procedure: LAPAROSCOPIC INSERTION CONTINUOUS AMBULATORY PERITONEAL DIALYSIS  (CAPD) CATHETER;  Surgeon: Jules Husbands, MD;  Location: [facility] ORS;  Service: General;  Laterality: N/A;  Provider requesting 1.5 hours / 90 minutes for procedure.   CATARACT EXTRACTION Bilateral 2014   CHOLECYSTECTOMY  2020   COLONOSCOPY     CORONARY ARTERY BYPASS GRAFT N/A 2002   Procedure: 3v CORONARY ARTERY ARTERY BYPASS GRAFT   EYE SURGERY Left 07/22/2020   Dr. Towanda Octave, [facility]    FAMILY HISTORY History reviewed. No pertinent family history.  SOCIAL HISTORY Social History   Tobacco Use   Smoking status: Former    Packs/day: 2.00    Years: 25.00    Total pack years: 50.00    Types: Cigarettes    Quit date: 2000    Years since quitting: 23.7    Passive exposure: Past   Smokeless tobacco: Never  Vaping Use   Vaping Use: Never used  Substance Use Topics   Alcohol use: Never   Drug use: Never         OPHTHALMIC EXAM:  Base Eye Exam     Visual Acuity (ETDRS)       Right Left   Dist cc 20/25 +3 20/20 -1    Correction: Glasses         Tonometry  (Tonopen, 2:09 PM)       Right Left   Pressure 6 11         Extraocular Movement       Right Left    Ortho Ortho    -- -- --  --  --  -- -- --   -- -- --  --  --  -- -- --           Neuro/Psych     Oriented x3: Yes   Mood/Affect: Normal         Dilation  Both eyes: 1.0% Mydriacyl, 2.5% Phenylephrine @ 2:06 PM           Slit Lamp and Fundus Exam     External Exam       Right Left   External Normal Normal         Slit Lamp Exam       Right Left   Lids/Lashes Normal Normal   Conjunctiva/Sclera White and quiet White and quiet   Cornea Clear Clear   Anterior Chamber Deep and quiet Deep and quiet   Iris Round and reactive Round and reactive   Lens Centered posterior chamber intraocular lens Centered posterior chamber intraocular lens   Anterior Vitreous Normal Normal         Fundus Exam       Right Left   Posterior Vitreous  Clear vitrectomized   Disc  Normal   C/D Ratio  0.15   Macula  Microaneurysms, no macular thickening, no exudates, no clinically significant macular edema   Vessels  NPDR- Moderate   Periphery  Normal            IMAGING AND PROCEDURES  Imaging and Procedures for 09/04/22  OCT, Retina - OU - Both Eyes       Right Eye Quality was good. Scan locations included subfoveal. Central Foveal Thickness: 314. Progression has improved. Findings include abnormal foveal contour.   Left Eye Quality was good. Scan locations included subfoveal. Central Foveal Thickness: 248. Progression has improved. Findings include abnormal foveal contour.   Notes OD with minor intraretinal fluid, status post vitrectomy membrane peel  much less thickening, much less outer retinal disturbance as compared to epiretinal membrane present November 2021 vastly improved overall since surgical intervention right eye  OS with perifoveal thickening with intraretinal fluid and extension to the outer retina inferonasal to FAZ consistent with a rap  like lesion when correlated with clinical findings on macular evaluation, today with much less CME and intraretinal fluid post recent Avastin.  Vastly improved as compared to onset October 2022 today again at 7-week interval left eye, will need repeat injection Avastin today and examination again in 7 weeks, with less thickening OS TODAY      Intravitreal Injection, Pharmacologic Agent - OS - Left Eye       Time Out 09/04/2022. 2:23 PM. Confirmed correct patient, procedure, site, and patient consented.   Anesthesia Topical anesthesia was used. Anesthetic medications included Lidocaine 4%.   Procedure Preparation included 5% betadine to ocular surface, 10% betadine to eyelids, Ofloxacin . A 30 gauge needle was used.   Injection: 2.5 mg Bevacizumab 1.'25mg'$ /0.53m   Route: Intravitreal, Site: Left Eye   NDC: [id number] Lot: [id number]  Post-op Post injection exam found visual acuity of at least counting fingers. The patient tolerated the procedure well. There were no complications. The patient received written and verbal post procedure care education. Post injection medications included ocuflox.              ASSESSMENT/PLAN:  Severe nonproliferative diabetic retinopathy of left eye, with macular edema, associated with type 2 diabetes mellitus (HCC) No signs of active macular edema  Exudative age-related macular degeneration of left eye with active choroidal neovascularization (HCC) Much improved OS on 7  weeks maintain good acuity, will repeat injection today and extended toward next     ICD-10-CM   1. Exudative age-related macular degeneration of left eye with active choroidal neovascularization (HCC)  H35.3221 OCT, Retina - OU - Both Eyes  Intravitreal Injection, Pharmacologic Agent - OS - Left Eye    Bevacizumab (AVASTIN) SOLN 2.5 mg    2. Severe nonproliferative diabetic retinopathy of left eye, with macular edema, associated with type 2 diabetes mellitus ([place])   H06.2376       1.  OS with exudative wet AMD.  Improved overall since onset of disease October 2022.  Much less intraretinal fluid.  Improved acuity per patient.  Today 7-week interval. With no sign of recurrence.  Follow-up next in 8 weeks and likely injection OS  2.  3.  Ophthalmic Meds Ordered this visit:  Meds ordered this encounter  Medications   Bevacizumab (AVASTIN) SOLN 2.5 mg       Return in about 8 weeks (around 10/30/2022) for DILATE OU, AVASTIN OCT, OS.  There are no Patient Instructions on file for this visit.   Explained the diagnoses, plan, and follow up with the patient and they expressed understanding.  Patient expressed understanding of the importance of proper follow up care.   Clent Demark Chaniece Barbato M.D. Diseases & Surgery of the Retina and Vitreous [facility] 09/04/22     Abbreviations: M myopia (nearsighted); A astigmatism; H hyperopia (farsighted); P presbyopia; Mrx spectacle prescription;  CTL contact lenses; OD right eye; OS left eye; OU both eyes  XT exotropia; ET esotropia; PEK punctate epithelial keratitis; PEE punctate epithelial erosions; DES dry eye syndrome; MGD meibomian gland dysfunction; ATs artificial tears; PFAT's preservative free artificial tears; [place] nuclear sclerotic cataract; PSC posterior subcapsular cataract; ERM epi-retinal membrane; PVD posterior vitreous detachment; RD retinal detachment; DM diabetes mellitus; DR diabetic retinopathy; NPDR non-proliferative diabetic retinopathy; PDR proliferative diabetic retinopathy; CSME clinically significant macular edema; DME diabetic macular edema; dbh dot blot hemorrhages; CWS cotton wool spot; POAG primary open angle glaucoma; C/D cup-to-disc ratio; HVF humphrey visual field; GVF goldmann visual field; OCT optical coherence tomography; IOP intraocular pressure; BRVO Branch retinal vein occlusion; CRVO central retinal vein occlusion; CRAO central retinal artery occlusion; BRAO branch  retinal artery occlusion; RT retinal tear; SB scleral buckle; PPV pars plana vitrectomy; VH Vitreous hemorrhage; PRP panretinal laser photocoagulation; IVK intravitreal kenalog; VMT vitreomacular traction; MH Macular hole;  NVD neovascularization of the disc; NVE neovascularization elsewhere; AREDS age related eye disease study; ARMD age related macular degeneration; POAG primary open angle glaucoma; EBMD epithelial/anterior basement membrane dystrophy; ACIOL anterior chamber intraocular lens; IOL intraocular lens; PCIOL posterior chamber intraocular lens; Phaco/IOL phacoemulsification with intraocular lens placement; [place] photorefractive keratectomy; LASIK laser assisted in situ keratomileusis; HTN hypertension; DM diabetes mellitus; COPD chronic obstructive pulmonary disease

## 2022-09-05 NOTE — Telephone Encounter (Signed)
Procedure clearance has been faxed to [facility] x 3

## 2022-09-07 NOTE — Telephone Encounter (Signed)
Clearance received from Cardiology--sent to be scanned

## 2022-09-20 ENCOUNTER — Telehealth: Payer: Self-pay

## 2022-09-20 NOTE — Telephone Encounter (Signed)
Patients daughter lvm to inform office she needed to cancel her colonoscopy with Dr. Allen Norris scheduled 10/03/22 due to cardiac stress testing needed to be performed prior to procedure.  She said she will call the office back to reschedule her colonoscopy after she has been cleared with her cardiologist.  Monica Becton, [place]

## 2022-10-03 ENCOUNTER — Ambulatory Visit: Admit: 2022-10-03 | Payer: Medicare PPO | Admitting: Gastroenterology

## 2022-10-03 SURGERY — COLONOSCOPY WITH PROPOFOL
Anesthesia: General

## 2022-10-10 ENCOUNTER — Ambulatory Visit (INDEPENDENT_AMBULATORY_CARE_PROVIDER_SITE_OTHER): Payer: Medicare PPO | Admitting: Vascular Surgery

## 2022-10-16 ENCOUNTER — Encounter (INDEPENDENT_AMBULATORY_CARE_PROVIDER_SITE_OTHER): Payer: Self-pay

## 2022-10-30 ENCOUNTER — Encounter (INDEPENDENT_AMBULATORY_CARE_PROVIDER_SITE_OTHER): Payer: Medicare PPO | Admitting: Ophthalmology

## 2022-10-31 ENCOUNTER — Encounter (INDEPENDENT_AMBULATORY_CARE_PROVIDER_SITE_OTHER): Payer: Self-pay | Admitting: Vascular Surgery

## 2022-10-31 ENCOUNTER — Ambulatory Visit (INDEPENDENT_AMBULATORY_CARE_PROVIDER_SITE_OTHER): Payer: Medicare PPO | Admitting: Vascular Surgery

## 2022-10-31 VITALS — BP 120/63 | HR 72 | Resp 16 | Wt 152.8 lb

## 2022-10-31 DIAGNOSIS — E1122 Type 2 diabetes mellitus with diabetic chronic kidney disease: Secondary | ICD-10-CM | POA: Diagnosis not present

## 2022-10-31 DIAGNOSIS — N184 Chronic kidney disease, stage 4 (severe): Secondary | ICD-10-CM

## 2022-10-31 DIAGNOSIS — I10 Essential (primary) hypertension: Secondary | ICD-10-CM

## 2022-10-31 DIAGNOSIS — I70212 Atherosclerosis of native arteries of extremities with intermittent claudication, left leg: Secondary | ICD-10-CM | POA: Diagnosis not present

## 2022-10-31 DIAGNOSIS — I6523 Occlusion and stenosis of bilateral carotid arteries: Secondary | ICD-10-CM

## 2022-10-31 NOTE — Progress Notes (Signed)
MRN : [id number]  Alexandria Moody is a 76 y.o. (1946-01-12) female who presents with chief complaint of  Chief Complaint  Patient presents with   Follow-up    Right carotid bruit  .  History of Present Illness: Patient returns today in follow up on referral after a carotid bruit was detected by her primary care physician.  This prompted an ultrasound at the hospital which demonstrated less than 50% carotid artery stenosis bilaterally.  We had seen her previously and discussed dialysis access with a fistula but she ended up doing peritoneal dialysis and this is working well for her.  We have also seen her for peripheral arterial disease.  She continues to be bothered by some mild claudication symptoms, but she has severe hip arthritis with bilateral hip replacements needed next year.  This is more bothersome to her than the claudication is  Current Outpatient Medications  Medication Sig Dispense Refill   albuterol (VENTOLIN HFA) 108 (90 Base) MCG/ACT inhaler Inhale 2 puffs into the lungs every 6 (six) hours as needed for wheezing or shortness of breath.     amLODipine (NORVASC) 5 MG tablet Take 5 mg by mouth 2 (two) times daily.     aspirin 81 MG EC tablet Take 81 mg by mouth daily.     calcitRIOL (ROCALTROL) 0.25 MCG capsule Take 0.25 mcg by mouth daily.     Cholecalciferol 125 MCG (5000 UT) capsule Take 5,000 Units by mouth daily.     citalopram (CELEXA) 20 MG tablet Take 20 mg by mouth every morning.     fluocinonide (LIDEX) 0.05 % external solution Apply 1 application topically 2 (two) times daily as needed (scalp irritation).     hydrALAZINE (APRESOLINE) 50 MG tablet Take 50 mg by mouth in the morning and at bedtime.     hydroxychloroquine (PLAQUENIL) 200 MG tablet Take 200 mg by mouth daily.     insulin glargine, 1 Unit Dial, (TOUJEO SOLOSTAR) 300 UNIT/ML Solostar Pen Inject 32 Units into the skin every morning.     Insulin Pen Needle (BD PEN NEEDLE NANO 2ND GEN) 32G X 4 MM MISC daily.      isosorbide mononitrate (IMDUR) 120 MG 24 hr tablet Take 120 mg by mouth at bedtime.     levothyroxine (SYNTHROID) 75 MCG tablet Take 75 mcg by mouth daily before breakfast.     linagliptin (TRADJENTA) 5 MG TABS tablet Take 5 mg by mouth every morning.     losartan (COZAAR) 25 MG tablet Take 25 mg by mouth every morning.     nitroGLYCERIN (NITROSTAT) 0.4 MG SL tablet Place 0.4 mg under the tongue every 5 (five) minutes as needed for chest pain.     Omega-3 Fatty Acids (FISH OIL) 1000 MG CAPS Take 1,000 mg by mouth in the morning and at bedtime.     omeprazole (PRILOSEC) 20 MG capsule Take 20 mg by mouth every morning.     ranolazine (RANEXA) 500 MG 12 hr tablet Take 500 mg by mouth 2 (two) times daily.     rosuvastatin (CRESTOR) 20 MG tablet Take 20 mg by mouth every morning.     torsemide (DEMADEX) 20 MG tablet Take 20 mg by mouth every morning.     No current facility-administered medications for this visit.    Past Medical History:  Diagnosis Date   Anemia of chronic renal failure    a.) recieving iron infusions + epoetin alfa-epbx (Retacrit)   Anginal pain (HCC)    Aortic  atherosclerosis ([place])    Arthritis    Atherosclerosis of native arteries of extremity with intermittent claudication ([place])    a.) ABI/TBI 06/24/2021: 0.95 RIGHT, 0.72 LEFT   Bilateral carotid artery disease ([place]) 07/25/2021   a.) carotid doppler 07/25/2021: < 31% RICA, < 51% LICA.   Carotid atherosclerosis, bilateral    CHF (congestive heart failure) ([place])    a.) TTE 05/12/2020: LVEF 55-60%; mild LA enlargement; trivial to mild MR; G1DD.   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   a.) smolding phase ANCA associated pauci immune    Heart murmur    HLD (hyperlipidemia)    Hypertension    Hypothyroidism    Long term current use of immunosuppressive drug    a.) hydroxychloriquine for ANCA (+) glomerulonephritis   Macular degeneration    PAD (peripheral artery disease) ([place])    Pneumonia 11/2021   S/P CABG x 3 2002   a.) LIMA-LAD, SVG-diagnoal, SVG-OM   Skin cancer    T2DM (type 2 diabetes mellitus) ([place])    TIA (transient ischemic attack)     Past Surgical History:  Procedure Laterality Date   ABDOMINAL HYSTERECTOMY     age 22   CAPD INSERTION N/A 02/03/2022   Procedure: LAPAROSCOPIC INSERTION CONTINUOUS AMBULATORY PERITONEAL DIALYSIS  (CAPD) CATHETER;  Surgeon: Jules Husbands, MD;  Location: [facility] ORS;  Service: General;  Laterality: N/A;  Provider requesting 1.5 hours / 90 minutes for procedure.   CATARACT EXTRACTION Bilateral 2014   CHOLECYSTECTOMY  2020   COLONOSCOPY     CORONARY ARTERY BYPASS GRAFT N/A 2002   Procedure: 3v CORONARY ARTERY ARTERY BYPASS GRAFT   EYE SURGERY Left 07/22/2020   Dr. Towanda Octave, Vit [facility] Mem [facility]     Social History   Tobacco Use   Smoking status: Former    Packs/day: 2.00    Years: 25.00    Total pack years: 50.00    Types: Cigarettes    Quit date: 2000    Years since quitting: 23.8    Passive exposure: Past   Smokeless tobacco: Never  Vaping Use   Vaping Use: Never used  Substance Use Topics   Alcohol use: Never   Drug use: Never      No family history on file.   Allergies  Allergen Reactions   Tape     Tears skin    REVIEW OF SYSTEMS (Negative unless checked)   Constitutional: '[]'$ Weight loss  '[]'$ Fever  '[]'$ Chills Cardiac: '[]'$ Chest pain   '[]'$ Chest pressure   '[]'$ Palpitations   '[]'$ Shortness of breath when laying flat   '[]'$ Shortness of breath at rest   '[]'$ Shortness of breath with exertion. Vascular:  '[x]'$ Pain in legs with walking   '[]'$ Pain in legs at rest   '[]'$ Pain in legs when laying flat   '[x]'$ Claudication   '[]'$ Pain in feet when walking  '[]'$ Pain in feet at rest  '[]'$ Pain  in feet when laying flat   '[]'$ History of DVT   '[]'$ Phlebitis   '[]'$ Swelling in legs   '[]'$ Varicose veins   '[]'$ Non-healing ulcers Pulmonary:   '[]'$ Uses home oxygen   '[]'$ Productive cough   '[]'$ Hemoptysis   '[]'$ Wheeze  '[]'$ COPD   '[]'$ Asthma Neurologic:  '[]'$ Dizziness  '[]'$ Blackouts   '[]'$ Seizures   '[]'$ History of stroke   '[]'$   History of TIA  '[]'$ Aphasia   '[]'$ Temporary blindness   '[]'$ Dysphagia   '[]'$ Weakness or numbness in arms   '[]'$ Weakness or numbness in legs Musculoskeletal:  '[x]'$ Arthritis   '[]'$ Joint swelling   '[x]'$ Joint pain   '[]'$ Low back pain Hematologic:  '[]'$ Easy bruising  '[]'$ Easy bleeding   '[]'$ Hypercoagulable state   '[]'$ Anemic  '[]'$ Hepatitis Gastrointestinal:  '[]'$ Blood in stool   '[]'$ Vomiting blood  '[]'$ Gastroesophageal reflux/heartburn   '[]'$ Abdominal pain Genitourinary:  '[x]'$ Chronic kidney disease   '[]'$ Difficult urination  '[]'$ Frequent urination  '[]'$ Burning with urination   '[]'$ Hematuria Skin:  '[]'$ Rashes   '[]'$ Ulcers   '[]'$ Wounds Psychological:  '[]'$ History of anxiety   '[]'$  History of major depression.   Physical Examination  BP 120/63 (BP Location: Right Arm)   Pulse 72   Resp 16   Wt 152 lb 12.8 oz (69.3 kg)   BMI 27.07 kg/m  Gen:  WD/WN, NAD Head: [place]/AT, No temporalis wasting. Ear/Nose/Throat: Hearing grossly intact, nares w/o erythema or drainage Eyes: Conjunctiva clear. Sclera non-icteric Neck: Supple.  Trachea midline. Right carotid bruit Pulmonary:  Good air movement, no use of accessory muscles.  Cardiac: RRR, no JVD Vascular:  Vessel Right Left  Radial Palpable Palpable                          PT 1+ Palpable 1+ Palpable  DP 2+ Palpable Trace Palpable   Gastrointestinal: soft, non-tender/non-distended. No guarding/reflex. PD catheter in place. Musculoskeletal: M/S 5/5 throughout.  No deformity or atrophy. No LE edema. Neurologic: Sensation grossly intact in extremities.  Symmetrical.  Speech is fluent.  Psychiatric: Judgment intact, Mood & affect appropriate for pt's clinical situation. Dermatologic: No rashes or ulcers noted.   No cellulitis or open wounds.     Labs No results found for this or any previous visit (from the past 2160 hour(s)).  Radiology No results found.  Assessment/Plan Type 2 diabetes mellitus (HCC) blood glucose control important in reducing the progression of atherosclerotic disease. Also, involved in wound healing. On appropriate medications.     Essential hypertension blood pressure control important in reducing the progression of atherosclerotic disease. On appropriate oral medications.  Atherosclerosis of native arteries of extremity with intermittent claudication ([place]) We held off on considering any intervention secondary to renal issues.  She is now on dialysis.  At this time however, her hip arthritis is limiting her more than her atherosclerotic occlusive disease.  Were going to hold off on any intervention as she certainly does not have any limb threatening symptoms and has adequate perfusion for healing her hip surgeries.  We will see her back next year and recheck ABIs for follow-up.  Carotid atherosclerosis, bilateral Recent carotid duplex demonstrates atherosclerotic disease in both carotid arteries but creating less than 50% stenosis on each side.  No role for intervention at that level.  Continue current medical regimen.  Recheck annually.    Leotis Pain, MD  10/31/2022 2:30 PM    This note was created with Dragon medical transcription system.  Any errors from dictation are purely unintentional

## 2022-10-31 NOTE — Assessment & Plan Note (Signed)
We held off on considering any intervention secondary to renal issues.  She is now on dialysis.  At this time however, her hip arthritis is limiting her more than her atherosclerotic occlusive disease.  Were going to hold off on any intervention as she certainly does not have any limb threatening symptoms and has adequate perfusion for healing her hip surgeries.  We will see her back next year and recheck ABIs for follow-up.

## 2022-10-31 NOTE — Assessment & Plan Note (Signed)
Recent carotid duplex demonstrates atherosclerotic disease in both carotid arteries but creating less than 50% stenosis on each side.  No role for intervention at that level.  Continue current medical regimen.  Recheck annually.

## 2022-11-26 DIAGNOSIS — M160 Bilateral primary osteoarthritis of hip: Secondary | ICD-10-CM | POA: Insufficient documentation

## 2022-11-26 DIAGNOSIS — M1611 Unilateral primary osteoarthritis, right hip: Secondary | ICD-10-CM | POA: Insufficient documentation

## 2022-12-11 IMAGING — US US CAROTID DUPLEX BILAT
1 series · 13 of 24 positions shown · non-contrast
Comparison: None available

CLINICAL DATA: Carotid atherosclerosis history of bilateral
endarterectomy, asymptomatic carotid bruit

EXAM:
BILATERAL CAROTID DUPLEX ULTRASOUND
TECHNIQUE: Gray scale imaging, color Doppler and duplex ultrasound were
performed of bilateral carotid and vertebral arteries in the neck.

[Series 1: us carotid duplex bilat · 13 of 63 slices shown]
[im 1/63]
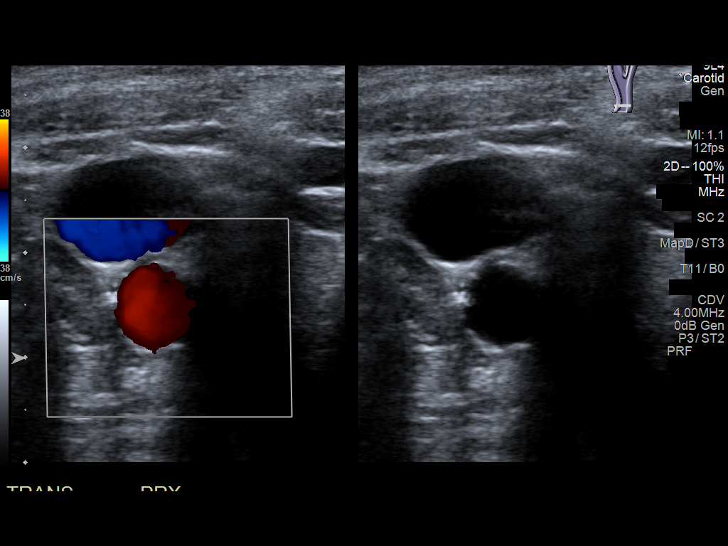
[im 6/63]
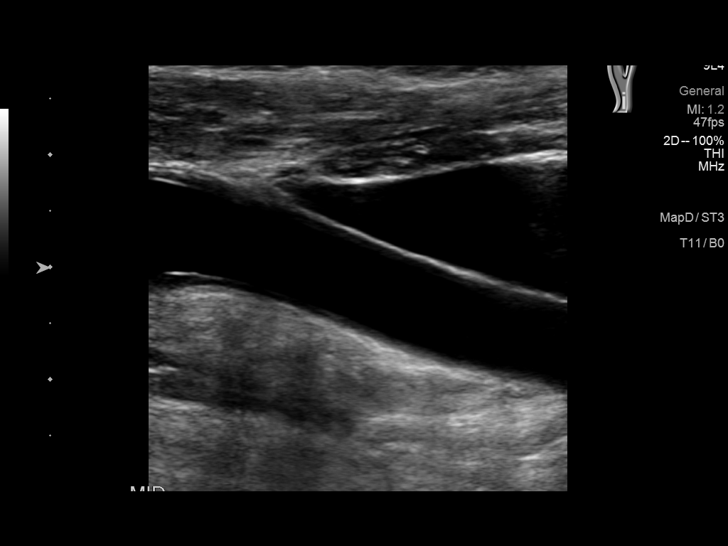
[im 11/63]
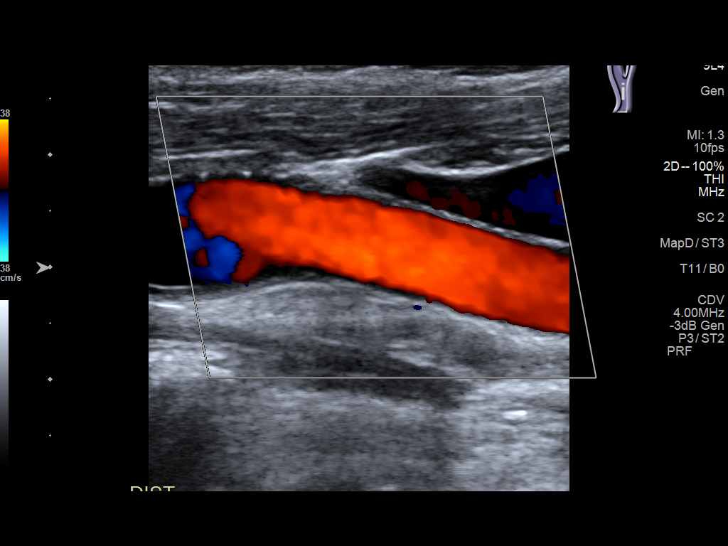
[im 17/63]
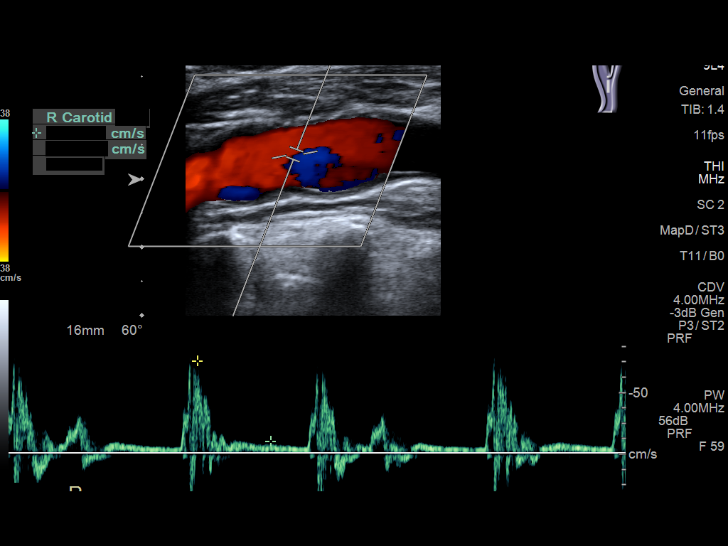
[im 22/63]
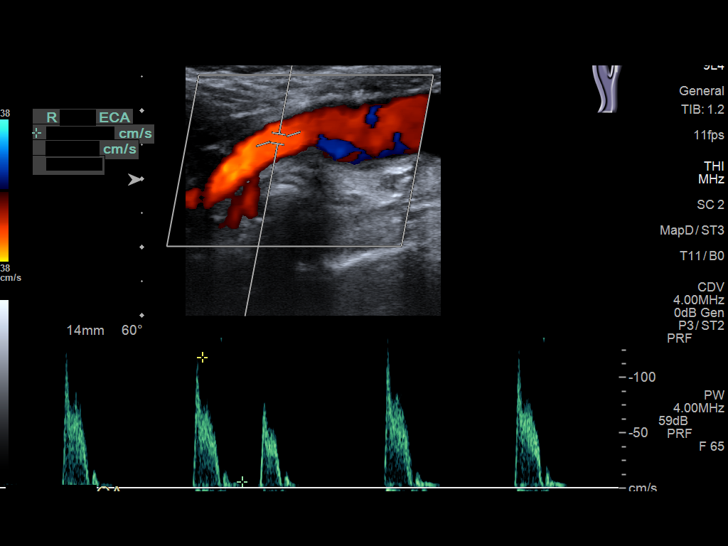
[im 27/63]
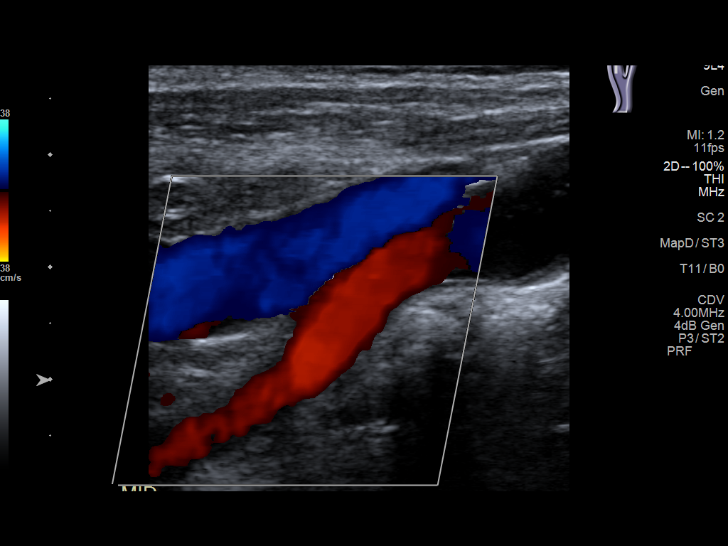
[im 33/63]
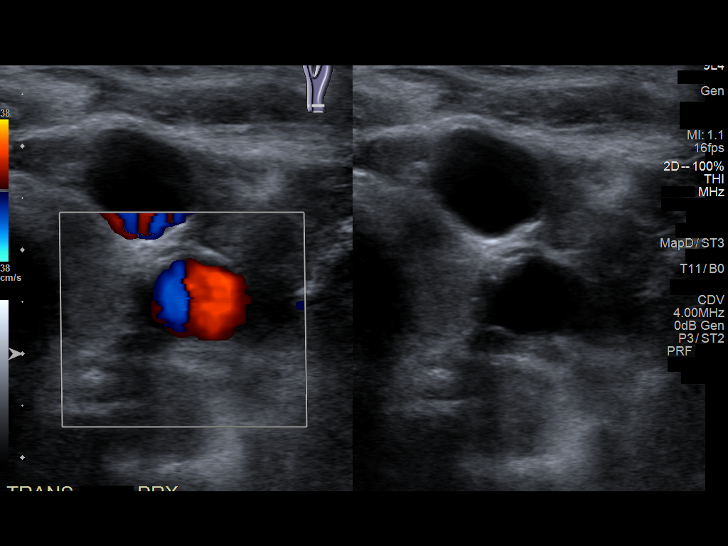
[im 36/63]
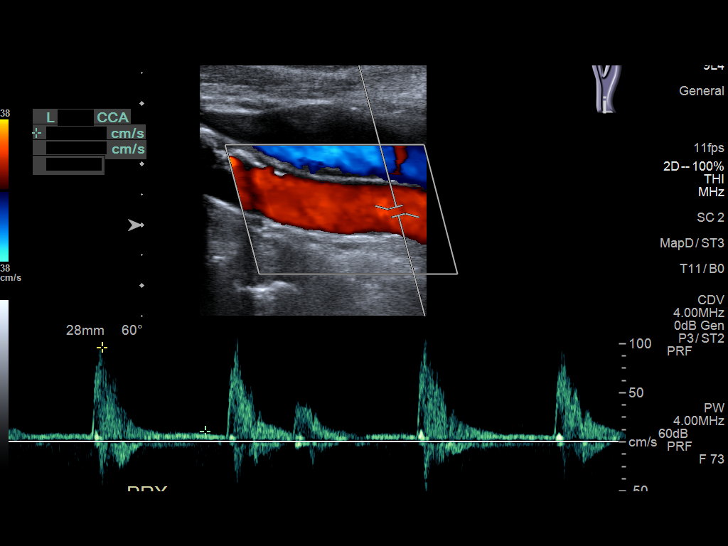
[im 41/63]
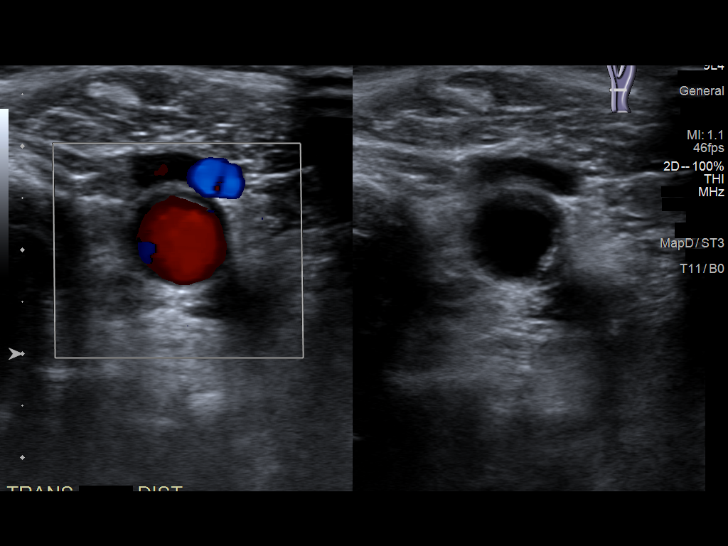
[im 46/63]
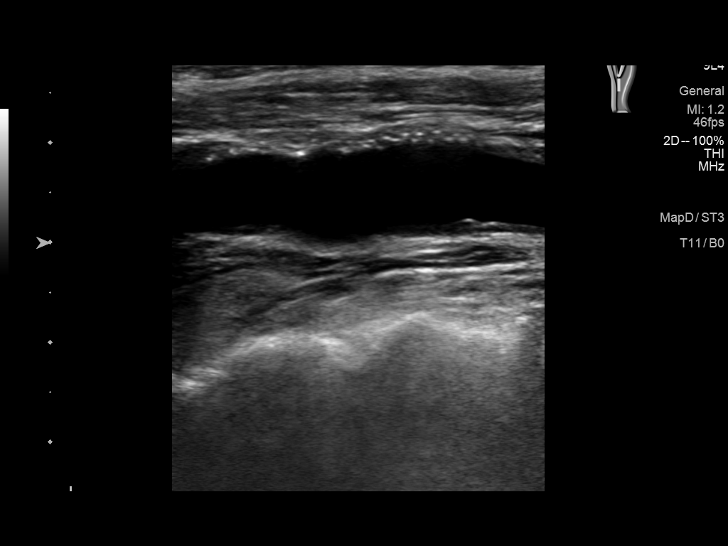
[im 52/63]
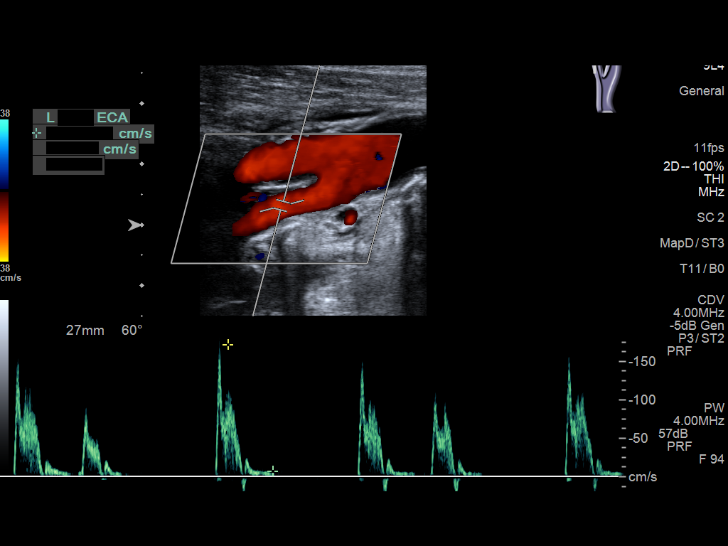
[im 57/63]
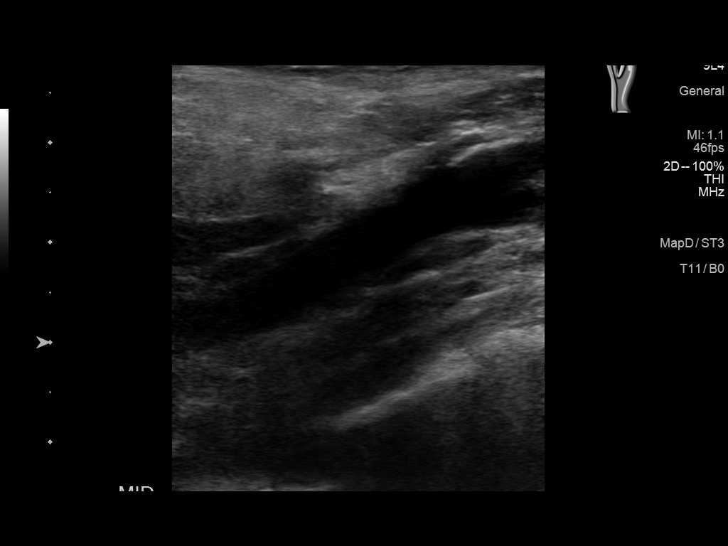
[im 63/63]
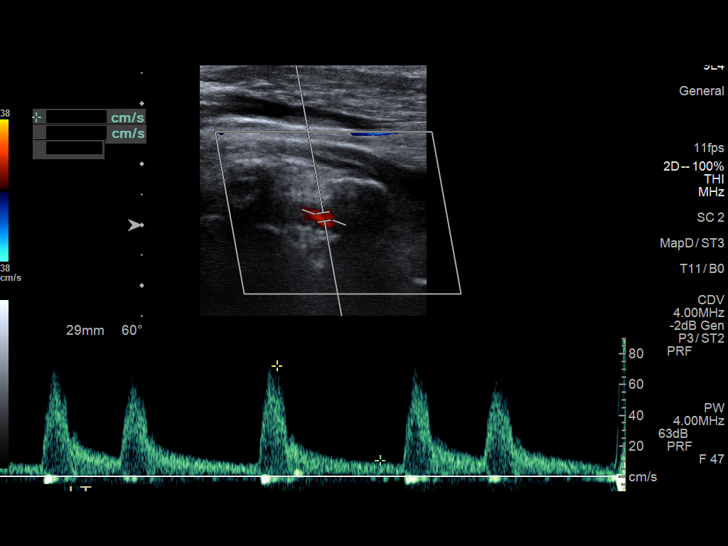

[13 of 24 positions shown; findings below may reference images not displayed]

FINDINGS: Criteria: Quantification of carotid stenosis is based on velocity
parameters that correlate the residual internal carotid diameter
with NASCET-based stenosis levels, using the diameter of the distal
internal carotid lumen as the denominator for stenosis measurement.

The following velocity measurements were obtained:

RIGHT

ICA: 117/20 cm/sec

CCA: 98/10 cm/sec

SYSTOLIC ICA/CCA RATIO:

ECA: 118 cm/sec

LEFT

ICA: 128/22 cm/sec

CCA: 105/15 cm/sec

SYSTOLIC ICA/CCA RATIO:

ECA: 172 cm/sec

RIGHT CAROTID ARTERY: Mildly ectatic carotid bifurcation and
scattered atherosclerotic change. Negative for stenosis, velocity
elevation, or turbulent flow. Degree of narrowing less than 50% by
ultrasound criteria.

RIGHT VERTEBRAL ARTERY:  Antegrade flow

LEFT CAROTID ARTERY: Scattered atherosclerotic changes and mild
ectasia. Negative for significant stenosis, velocity elevation, or
turbulent flow. Degree of narrowing also less than 50% by ultrasound
criteria.

LEFT VERTEBRAL ARTERY:  Antegrade flow
IMPRESSION: Bilateral carotid atherosclerosis. Negative for stenosis. Degree of
narrowing less than 50% bilaterally by ultrasound criteria.

Patent antegrade vertebral flow bilaterally

## 2023-01-24 ENCOUNTER — Other Ambulatory Visit: Payer: Self-pay

## 2023-01-24 ENCOUNTER — Encounter: Payer: Self-pay | Admitting: Internal Medicine

## 2023-01-24 ENCOUNTER — Observation Stay
Admission: EM | Admit: 2023-01-24 | Discharge: 2023-01-25 | Disposition: A | Discharge status: Home Health | Payer: Medicare PPO | Attending: Student | Admitting: Student

## 2023-01-24 ENCOUNTER — Emergency Department: Payer: Medicare PPO

## 2023-01-24 DIAGNOSIS — I2510 Atherosclerotic heart disease of native coronary artery without angina pectoris: Secondary | ICD-10-CM | POA: Insufficient documentation

## 2023-01-24 DIAGNOSIS — R0600 Dyspnea, unspecified: Secondary | ICD-10-CM | POA: Insufficient documentation

## 2023-01-24 DIAGNOSIS — N186 End stage renal disease: Secondary | ICD-10-CM | POA: Diagnosis not present

## 2023-01-24 DIAGNOSIS — Z79899 Other long term (current) drug therapy: Secondary | ICD-10-CM | POA: Insufficient documentation

## 2023-01-24 DIAGNOSIS — I739 Peripheral vascular disease, unspecified: Secondary | ICD-10-CM | POA: Diagnosis present

## 2023-01-24 DIAGNOSIS — E1122 Type 2 diabetes mellitus with diabetic chronic kidney disease: Secondary | ICD-10-CM | POA: Diagnosis not present

## 2023-01-24 DIAGNOSIS — J189 Pneumonia, unspecified organism: Secondary | ICD-10-CM | POA: Diagnosis not present

## 2023-01-24 DIAGNOSIS — Z87891 Personal history of nicotine dependence: Secondary | ICD-10-CM | POA: Insufficient documentation

## 2023-01-24 DIAGNOSIS — I132 Hypertensive heart and chronic kidney disease with heart failure and with stage 5 chronic kidney disease, or end stage renal disease: Secondary | ICD-10-CM | POA: Diagnosis not present

## 2023-01-24 DIAGNOSIS — E1159 Type 2 diabetes mellitus with other circulatory complications: Secondary | ICD-10-CM | POA: Diagnosis present

## 2023-01-24 DIAGNOSIS — Z8673 Personal history of transient ischemic attack (TIA), and cerebral infarction without residual deficits: Secondary | ICD-10-CM | POA: Insufficient documentation

## 2023-01-24 DIAGNOSIS — R0602 Shortness of breath: Secondary | ICD-10-CM | POA: Diagnosis present

## 2023-01-24 DIAGNOSIS — Z794 Long term (current) use of insulin: Secondary | ICD-10-CM | POA: Insufficient documentation

## 2023-01-24 DIAGNOSIS — E039 Hypothyroidism, unspecified: Secondary | ICD-10-CM | POA: Diagnosis not present

## 2023-01-24 DIAGNOSIS — I509 Heart failure, unspecified: Secondary | ICD-10-CM | POA: Diagnosis not present

## 2023-01-24 DIAGNOSIS — I7782 Antineutrophilic cytoplasmic antibody (ANCA) vasculitis: Secondary | ICD-10-CM | POA: Diagnosis present

## 2023-01-24 DIAGNOSIS — Z85828 Personal history of other malignant neoplasm of skin: Secondary | ICD-10-CM | POA: Insufficient documentation

## 2023-01-24 DIAGNOSIS — J849 Interstitial pulmonary disease, unspecified: Secondary | ICD-10-CM | POA: Diagnosis not present

## 2023-01-24 DIAGNOSIS — E119 Type 2 diabetes mellitus without complications: Secondary | ICD-10-CM

## 2023-01-24 DIAGNOSIS — K219 Gastro-esophageal reflux disease without esophagitis: Secondary | ICD-10-CM | POA: Diagnosis present

## 2023-01-24 DIAGNOSIS — Z992 Dependence on renal dialysis: Secondary | ICD-10-CM | POA: Diagnosis not present

## 2023-01-24 DIAGNOSIS — Z951 Presence of aortocoronary bypass graft: Secondary | ICD-10-CM | POA: Diagnosis not present

## 2023-01-24 DIAGNOSIS — Z7982 Long term (current) use of aspirin: Secondary | ICD-10-CM | POA: Diagnosis not present

## 2023-01-24 DIAGNOSIS — E1169 Type 2 diabetes mellitus with other specified complication: Secondary | ICD-10-CM | POA: Diagnosis present

## 2023-01-24 DIAGNOSIS — I152 Hypertension secondary to endocrine disorders: Secondary | ICD-10-CM | POA: Diagnosis present

## 2023-01-24 DIAGNOSIS — F325 Major depressive disorder, single episode, in full remission: Secondary | ICD-10-CM | POA: Diagnosis present

## 2023-01-24 LAB — COMPREHENSIVE METABOLIC PANEL
ALT: 17 U/L (ref 0–44)
AST: 26 U/L (ref 15–41)
Albumin: 2.4 g/dL — ABNORMAL LOW (ref 3.5–5.0)
Alkaline Phosphatase: 82 U/L (ref 38–126)
Anion gap: 12 (ref 5–15)
BUN: 61 mg/dL — ABNORMAL HIGH (ref 8–23)
CO2: 25 mmol/L (ref 22–32)
Calcium: 8.4 mg/dL — ABNORMAL LOW (ref 8.9–10.3)
Chloride: 97 mmol/L — ABNORMAL LOW (ref 98–111)
Creatinine, Ser: 2.74 mg/dL — ABNORMAL HIGH (ref 0.44–1.00)
GFR, Estimated: 18 mL/min — ABNORMAL LOW (ref >60)
Glucose, Bld: 206 mg/dL — ABNORMAL HIGH (ref 70–99)
Potassium: 3.8 mmol/L (ref 3.5–5.1)
Sodium: 134 mmol/L — ABNORMAL LOW (ref 135–145)
Total Bilirubin: 0.7 mg/dL (ref 0.3–1.2)
Total Protein: 6.9 g/dL (ref 6.5–8.1)

## 2023-01-24 LAB — TROPONIN I (HIGH SENSITIVITY)
Troponin I (High Sensitivity): 25 ng/L — ABNORMAL HIGH (ref <18)
Troponin I (High Sensitivity): 22 ng/L — ABNORMAL HIGH (ref <18)

## 2023-01-24 LAB — BRAIN NATRIURETIC PEPTIDE: B Natriuretic Peptide: 349.5 pg/mL — ABNORMAL HIGH (ref 0.0–100.0)

## 2023-01-24 LAB — CBG MONITORING, ED: Glucose-Capillary: 205 mg/dL — ABNORMAL HIGH (ref 70–99)

## 2023-01-24 LAB — CBC
HCT: 32.9 % — ABNORMAL LOW (ref 36.0–46.0)
Hemoglobin: 10.8 g/dL — ABNORMAL LOW (ref 12.0–15.0)
MCH: 33.6 pg (ref 26.0–34.0)
MCHC: 32.8 g/dL (ref 30.0–36.0)
MCV: 102.5 fL — ABNORMAL HIGH (ref 80.0–100.0)
Platelets: 354 10*3/uL (ref 150–400)
RBC: 3.21 MIL/uL — ABNORMAL LOW (ref 3.87–5.11)
RDW: 13.7 % (ref 11.5–15.5)
WBC: 9 10*3/uL (ref 4.0–10.5)
nRBC: 0 % (ref 0.0–0.2)

## 2023-01-24 LAB — HEPATITIS B SURFACE ANTIGEN: Hepatitis B Surface Ag: NONREACTIVE

## 2023-01-24 LAB — TSH: TSH: 1.226 u[IU]/mL (ref 0.350–4.500)

## 2023-01-24 LAB — GLUCOSE, CAPILLARY
Glucose-Capillary: 186 mg/dL — ABNORMAL HIGH (ref 70–99)
Glucose-Capillary: 169 mg/dL — ABNORMAL HIGH (ref 70–99)

## 2023-01-24 LAB — PROCALCITONIN: Procalcitonin: 0.56 ng/mL

## 2023-01-24 MED ORDER — ACETAMINOPHEN 650 MG RE SUPP: 650.0000 mg | Freq: Four times a day (QID) | RECTAL | Status: DC | PRN

## 2023-01-24 MED ORDER — NITROGLYCERIN 0.4 MG SL SUBL: 0.4000 mg | SUBLINGUAL_TABLET | SUBLINGUAL | Status: DC | PRN

## 2023-01-24 MED ORDER — ISOSORBIDE MONONITRATE ER 30 MG PO TB24
120.0000 mg | ORAL_TABLET | Freq: Every day | ORAL | Status: DC
  Administered 2023-01-24: 120 mg via ORAL
  Filled 2023-01-24: qty 4

## 2023-01-24 MED ORDER — SODIUM CHLORIDE 0.9 % IV SOLN: INTRAVENOUS | Status: DC | PRN

## 2023-01-24 MED ORDER — ALBUTEROL SULFATE (2.5 MG/3ML) 0.083% IN NEBU
2.5000 mg | INHALATION_SOLUTION | Freq: Four times a day (QID) | RESPIRATORY_TRACT | Status: DC | PRN
  Administered 2023-01-24: 2.5 mg via RESPIRATORY_TRACT
  Filled 2023-01-24: qty 3

## 2023-01-24 MED ORDER — DEXTROSE 5 % IV SOLN: 250.0000 mg | Freq: Once | INTRAVENOUS | Status: DC

## 2023-01-24 MED ORDER — ACETAMINOPHEN 325 MG PO TABS
650.0000 mg | ORAL_TABLET | Freq: Four times a day (QID) | ORAL | Status: DC | PRN
  Administered 2023-01-25: 650 mg via ORAL
  Filled 2023-01-24: qty 2

## 2023-01-24 MED ORDER — ONDANSETRON HCL 4 MG/2ML IJ SOLN: 4.0000 mg | Freq: Four times a day (QID) | INTRAMUSCULAR | Status: DC | PRN

## 2023-01-24 MED ORDER — FUROSEMIDE 10 MG/ML IJ SOLN
40.0000 mg | Freq: Once | INTRAMUSCULAR | Status: AC
  Administered 2023-01-24: 40 mg via INTRAVENOUS
  Filled 2023-01-24: qty 4

## 2023-01-24 MED ORDER — DELFLEX-LC/1.5% DEXTROSE 344 MOSM/L IP SOLN: INTRAPERITONEAL | Status: DC

## 2023-01-24 MED ORDER — RANOLAZINE ER 500 MG PO TB12
500.0000 mg | ORAL_TABLET | Freq: Two times a day (BID) | ORAL | Status: DC
  Administered 2023-01-24 – 2023-01-25 (×2): 500 mg via ORAL
  Filled 2023-01-24 (×2): qty 1

## 2023-01-24 MED ORDER — DELFLEX-LC/2.5% DEXTROSE 394 MOSM/L IP SOLN: INTRAPERITONEAL | Status: DC

## 2023-01-24 MED ORDER — AZITHROMYCIN 200 MG/5ML PO SUSR
250.0000 mg | Freq: Once | ORAL | Status: AC
  Administered 2023-01-24: 250 mg via ORAL
  Filled 2023-01-24: qty 10

## 2023-01-24 MED ORDER — ASPIRIN 81 MG PO TBEC
81.0000 mg | DELAYED_RELEASE_TABLET | Freq: Every day | ORAL | Status: DC
  Administered 2023-01-25: 81 mg via ORAL
  Filled 2023-01-24: qty 1

## 2023-01-24 MED ORDER — SENNOSIDES-DOCUSATE SODIUM 8.6-50 MG PO TABS: 1.0000 | ORAL_TABLET | Freq: Every evening | ORAL | Status: DC | PRN

## 2023-01-24 MED ORDER — ROSUVASTATIN CALCIUM 10 MG PO TABS
20.0000 mg | ORAL_TABLET | ORAL | Status: DC
  Administered 2023-01-25: 20 mg via ORAL
  Filled 2023-01-24: qty 2

## 2023-01-24 MED ORDER — LEVOTHYROXINE SODIUM 50 MCG PO TABS
75.0000 ug | ORAL_TABLET | Freq: Every day | ORAL | Status: DC
  Administered 2023-01-25: 75 ug via ORAL
  Filled 2023-01-24: qty 2

## 2023-01-24 MED ORDER — PANTOPRAZOLE SODIUM 40 MG PO TBEC
40.0000 mg | DELAYED_RELEASE_TABLET | Freq: Every day | ORAL | Status: DC
  Administered 2023-01-25: 40 mg via ORAL
  Filled 2023-01-24: qty 1

## 2023-01-24 MED ORDER — GENTAMICIN SULFATE 0.1 % EX CREA
1.0000 | TOPICAL_CREAM | Freq: Every day | CUTANEOUS | Status: DC
  Administered 2023-01-24 – 2023-01-25 (×2): 1 via TOPICAL
  Filled 2023-01-24: qty 15

## 2023-01-24 MED ORDER — CITALOPRAM HYDROBROMIDE 20 MG PO TABS
20.0000 mg | ORAL_TABLET | ORAL | Status: DC
  Administered 2023-01-25: 20 mg via ORAL
  Filled 2023-01-24: qty 1

## 2023-01-24 MED ORDER — INSULIN ASPART 100 UNIT/ML IJ SOLN
0.0000 [IU] | Freq: Three times a day (TID) | INTRAMUSCULAR | Status: DC
  Administered 2023-01-24: 1 [IU] via SUBCUTANEOUS
  Administered 2023-01-25: 2 [IU] via SUBCUTANEOUS
  Filled 2023-01-24 (×2): qty 1

## 2023-01-24 MED ORDER — INSULIN ASPART 100 UNIT/ML IJ SOLN: 0.0000 [IU] | Freq: Every day | INTRAMUSCULAR | Status: DC

## 2023-01-24 MED ORDER — HYDRALAZINE HCL 20 MG/ML IJ SOLN: 5.0000 mg | Freq: Three times a day (TID) | INTRAMUSCULAR | Status: DC | PRN

## 2023-01-24 MED ORDER — ONDANSETRON HCL 4 MG PO TABS: 4.0000 mg | ORAL_TABLET | Freq: Four times a day (QID) | ORAL | Status: DC | PRN

## 2023-01-24 MED ORDER — LOSARTAN POTASSIUM 50 MG PO TABS
100.0000 mg | ORAL_TABLET | Freq: Every day | ORAL | Status: DC
  Administered 2023-01-25: 100 mg via ORAL
  Filled 2023-01-24: qty 2

## 2023-01-24 MED ORDER — SODIUM CHLORIDE 0.9 % IV SOLN
1.0000 g | INTRAVENOUS | Status: DC
  Administered 2023-01-24: 1 g via INTRAVENOUS
  Filled 2023-01-24 (×2): qty 10

## 2023-01-24 MED ORDER — DEXTROSE 5 % IV SOLN
250.0000 mg | Freq: Once | INTRAVENOUS | Status: DC
  Filled 2023-01-24: qty 2.5

## 2023-01-24 MED ORDER — SODIUM CHLORIDE 0.9 % IV SOLN
500.0000 mg | INTRAVENOUS | Status: DC
  Administered 2023-01-25: 500 mg via INTRAVENOUS
  Filled 2023-01-24: qty 500

## 2023-01-24 MED ORDER — HYDROXYCHLOROQUINE SULFATE 200 MG PO TABS
200.0000 mg | ORAL_TABLET | Freq: Every day | ORAL | Status: DC
  Administered 2023-01-25: 200 mg via ORAL
  Filled 2023-01-24: qty 1

## 2023-01-24 MED ORDER — INSULIN GLARGINE-YFGN 100 UNIT/ML ~~LOC~~ SOLN
16.0000 [IU] | SUBCUTANEOUS | Status: DC
  Administered 2023-01-25: 16 [IU] via SUBCUTANEOUS
  Filled 2023-01-24: qty 0.16

## 2023-01-24 MED ORDER — HEPARIN SODIUM (PORCINE) 5000 UNIT/ML IJ SOLN
5000.0000 [IU] | Freq: Three times a day (TID) | INTRAMUSCULAR | Status: DC
  Administered 2023-01-24 – 2023-01-25 (×2): 5000 [IU] via SUBCUTANEOUS
  Filled 2023-01-24 (×2): qty 1

## 2023-01-24 NOTE — ED Provider Notes (Addendum)
[facility] Provider Note    Event Date/Time   First MD Initiated Contact with Patient 01/24/23 1052     (approximate)   History   Shortness of Breath   HPI  Alexandria Moody is a 77 y.o. female with a history of CABG, COPD, end-stage renal disease on peritoneal dialysis, PAD, diabetes who presents with complaints of shortness of breath.  Patient reports 1 to 2 weeks of shortness of breath, significantly worse when she lies flat.  Had outpatient labs and x-ray performed and was sent to the emergency department for evaluation of new onset CHF.  No lower extremity swelling, no calf pain or swelling.  No fever     Physical Exam   Triage Vital Signs: ED Triage Vitals [01/24/23 1026]  Enc Vitals Group     BP (!) 164/59     Pulse Rate 60     Resp 20     Temp (!) 97.5 F (36.4 C)     Temp Source Oral     SpO2 100 %     Weight 68.5 kg (151 lb)     Height 1.6 m ('5\' 3"'$ )     Head Circumference      Peak Flow      Pain Score 0     Pain Loc      Pain Edu?      Excl. in [place]?     Most recent vital signs: Vitals:   01/24/23 1026  BP: (!) 164/59  Pulse: 60  Resp: 20  Temp: (!) 97.5 F (36.4 C)  SpO2: 100%     General: Awake,   CV:  Good peripheral perfusion.  Resp:  Tachypnea, bibasilar Rales Abd:  No distention.  Other:     ED Results / Procedures / Treatments   Labs (all labs ordered are listed, but only abnormal results are displayed) Labs Reviewed  CBC - Abnormal; Notable for the following components:      Result Value   RBC 3.21 (*)    Hemoglobin 10.8 (*)    HCT 32.9 (*)    MCV 102.5 (*)    All other components within normal limits  COMPREHENSIVE METABOLIC PANEL - Abnormal; Notable for the following components:   Sodium 134 (*)    Chloride 97 (*)    Glucose, Bld 206 (*)    BUN 61 (*)    Creatinine, Ser 2.74 (*)    Calcium 8.4 (*)    Albumin 2.4 (*)    GFR, Estimated 18 (*)    All other components within normal limits  BRAIN  NATRIURETIC PEPTIDE - Abnormal; Notable for the following components:   B Natriuretic Peptide 349.5 (*)    All other components within normal limits  CBG MONITORING, ED - Abnormal; Notable for the following components:   Glucose-Capillary 205 (*)    All other components within normal limits  TROPONIN I (HIGH SENSITIVITY) - Abnormal; Notable for the following components:   Troponin I (High Sensitivity) 25 (*)    All other components within normal limits  TSH  TROPONIN I (HIGH SENSITIVITY)     EKG ED ECG REPORT I, Lavonia Drafts, the attending physician, personally viewed and interpreted this ECG.   Rhythm: normal sinus rhythm QRS Axis: normal Intervals: normal ST/T Wave abnormalities: normal Narrative Interpretation: no evidence of acute ischemia    RADIOLOGY Chest x-ray viewed interpret by me, suspicious for pulmonary edema    PROCEDURES:  Critical Care performed:   Procedures  MEDICATIONS ORDERED IN ED: Medications  acetaminophen (TYLENOL) tablet 650 mg (has no administration in time range)    Or  acetaminophen (TYLENOL) suppository 650 mg (has no administration in time range)  ondansetron (ZOFRAN) tablet 4 mg (has no administration in time range)    Or  ondansetron (ZOFRAN) injection 4 mg (has no administration in time range)  heparin injection 5,000 Units (has no administration in time range)  furosemide (LASIX) injection 40 mg (has no administration in time range)  senna-docusate (Senokot-S) tablet 1 tablet (has no administration in time range)     IMPRESSION / MDM / ASSESSMENT AND PLAN / ED COURSE  I reviewed the triage vital signs and the nursing notes. Patient's presentation is most consistent with acute presentation with potential threat to life or bodily function.  Patient presents with shortness of breath, significantly worse with lying down, she is tachypneic on arrival.    No significant wheezing on exam, most suspicious for CHF especially given  her history.  Chest x-ray is potentially can with CHF versus ILD  Troponin is elevated at 25, the patient does not have any chest pain, BNP is elevated at 350 however she is on peritoneal dialysis  I have consulted nephrology, Dr. Juleen China will see the patient in the ED  Patient will require admission for further diagnosis, possible diuresis        FINAL CLINICAL IMPRESSION(S) / ED DIAGNOSES   Final diagnoses:  Shortness of breath  ESRD (end stage renal disease) ([place])  Interstitial lung disease ([place])     Rx / DC Orders   ED Discharge Orders     None        Note:  This document was prepared using Dragon voice recognition software and may include unintentional dictation errors.   Lavonia Drafts, MD 01/24/23 1246    Lavonia Drafts, MD 01/24/23 1259

## 2023-01-24 NOTE — ED Notes (Signed)
Informed RN bed assigned 

## 2023-01-24 NOTE — Assessment & Plan Note (Signed)
-   Elevated procalcitonin - Patient took 1 tablet of her Z-Pak prior to ED presentation - Ordered one-time dose of azithromycin 250 mg IV on day of admission, and ordered azithromycin 500 mg IV starting on 01/25/2023, to complete 5-day course - Ceftriaxone 1 g IV daily, 5 days course ordered - Flutter valve, incentive spirometry - Admit to telemetry medical, inpatient

## 2023-01-24 NOTE — Assessment & Plan Note (Signed)
-   Resumed home hydroxychloroquine 200 mg daily

## 2023-01-24 NOTE — ED Triage Notes (Addendum)
Pt had chest xray yesterday across the street for her sob and was called this am and told that she was in chf, pt states that she has never had chf before, not feel well since last Tuesday. Pt states that she does peritoneal dilaysis at home, pt states that sometimes what she puts in doesn't always come out

## 2023-01-24 NOTE — Hospital Course (Signed)
[name] is a 77 year old female with history of end-stage renal disease on peritoneal dialysis, CAD, insulin-dependent diabetes mellitus, hypertension, hypothyroid, GERD, hyperlipidemia, who presents emergency department for chief concerns of shortness of breath.  Initial vitals in the ED showed temperature of 97.5, respiration rate of 20, heart rate of 60, blood pressure 164/59, SpO2 of 100% on room air.  Serum sodium is 134, potassium 3.8, chloride 97, bicarb 25, BUN of 61, serum creatinine of 2.74, EGFR 18, nonfasting blood glucose 206, WBC 9.0, hemoglobin 10.8, platelets of 354.  BNP was elevated at 349.5.  High sensitivity troponin was 25.  Chest x-ray 2 view: Was read as coarsened interstitial markings bilaterally, similar to prior.  Appearance favors an underlying interstitial disease over pulmonary edema.  No lobar consolidation.  ED treatment: None

## 2023-01-24 NOTE — Assessment & Plan Note (Signed)
-   Levothyroxine 75 mcg daily before breakfast resumed

## 2023-01-24 NOTE — Assessment & Plan Note (Signed)
PPI ?

## 2023-01-24 NOTE — Assessment & Plan Note (Signed)
-   Resumed home citalopram 20 mg daily

## 2023-01-24 NOTE — ED Notes (Signed)
Provider evaluated pt at bedside. Pt feeling SOB since several days ago. Was told yesterday that has evidence of CHF on chest xray. Pt does peritoneal dialysis, had full session last night and states amount in is often more than amount out. Still makes urine. Lungs have coarse crackles in lower lobes.

## 2023-01-24 NOTE — Progress Notes (Signed)
[facility]  ROUNDING NOTE   Subjective:   [name] was admitted to [facility] on 01/24/2023 for sob cough  Last peritoneal dialysis treatment was last night. She reports retaining fluid.   Patient presents with her daughter who assists with history taking. Patient has been having progressive shortness of breath for over a week. No fever, no productive cough. Patient denies any peripheral edema. Patient claims to be taking all her medications as prescribed.   Objective:  Vital signs in last 24 hours:  Temp:  [97.5 F (36.4 C)] 97.5 F (36.4 C) (02/07 1026) Pulse Rate:  [60] 60 (02/07 1026) Resp:  [20] 20 (02/07 1026) BP: (164)/(59) 164/59 (02/07 1026) SpO2:  [100 %] 100 % (02/07 1026) Weight:  [68.5 kg] 68.5 kg (02/07 1026)  Weight change:  Filed Weights   01/24/23 1026  Weight: 68.5 kg    Intake/Output: No intake/output data recorded.   Intake/Output this shift:  No intake/output data recorded.  Physical Exam: General: NAD,   Head: Normocephalic, atraumatic. Moist oral mucosal membranes  Eyes: Anicteric, PERRL  Neck: Supple, trachea midline  Lungs:  Crackles as bases  Heart: Regular rate and rhythm  Abdomen:  Soft, nontender,   Extremities:  no peripheral edema.  Neurologic: Nonfocal, moving all four extremities  Skin: No lesions  Access: PD catheter    Basic Metabolic Panel: Recent Labs  Lab 01/24/23 1042  NA 134*  K 3.8  CL 97*  CO2 25  GLUCOSE 206*  BUN 61*  CREATININE 2.74*  CALCIUM 8.4*    Liver Function Tests: Recent Labs  Lab 01/24/23 1042  AST 26  ALT 17  ALKPHOS 82  BILITOT 0.7  PROT 6.9  ALBUMIN 2.4*   No results for input(s): "LIPASE", "AMYLASE" in the last 168 hours. No results for input(s): "AMMONIA" in the last 168 hours.  CBC: Recent Labs  Lab 01/24/23 1042  WBC 9.0  HGB 10.8*  HCT 32.9*  MCV 102.5*  PLT 354    Cardiac Enzymes: No results for input(s): "CKTOTAL", "CKMB", "CKMBINDEX", "TROPONINI" in  the last 168 hours.  BNP: Invalid input(s): "POCBNP"  CBG: Recent Labs  Lab 01/24/23 1040  GLUCAP 205*    Microbiology: Results for orders placed or performed during the hospital encounter of 02/09/22  Urine Culture     Status: Abnormal   Collection Time: 02/09/22  7:56 PM   Specimen: Urine, Random  Result Value Ref Range Status   Specimen Description   Final    URINE, RANDOM Performed at [facility], [address], [place], [place] [address]    Special Requests   Final    NONE Performed at [facility], [address] [place], [place] [address]    Culture >=100,000 COLONIES/mL CITROBACTER KOSERI (A)  Final   Report Status 02/12/2022 FINAL  Final   Organism ID, Bacteria CITROBACTER KOSERI (A)  Final      Susceptibility   Citrobacter koseri - MIC*    CEFAZOLIN >=64 RESISTANT Resistant     CEFEPIME <=0.12 SENSITIVE Sensitive     CEFTRIAXONE 1 SENSITIVE Sensitive     CIPROFLOXACIN <=0.25 SENSITIVE Sensitive     GENTAMICIN <=1 SENSITIVE Sensitive     IMIPENEM <=0.25 SENSITIVE Sensitive     NITROFURANTOIN 32 SENSITIVE Sensitive     TRIMETH/SULFA <=20 SENSITIVE Sensitive     PIP/TAZO <=4 SENSITIVE Sensitive     * >=100,000 COLONIES/mL CITROBACTER KOSERI    Coagulation Studies: No results for input(s): "LABPROT", "  INR" in the last 72 hours.  Urinalysis: No results for input(s): "COLORURINE", "LABSPEC", "PHURINE", "GLUCOSEU", "HGBUR", "BILIRUBINUR", "KETONESUR", "PROTEINUR", "UROBILINOGEN", "NITRITE", "LEUKOCYTESUR" in the last 72 hours.  Invalid input(s): "APPERANCEUR"    Imaging: DG Chest 2 View  Result Moody: 01/24/2023 CLINICAL DATA:  Shortness of breath EXAM: CHEST - 2 VIEW COMPARISON:  11/14/2021 FINDINGS: Heart size within normal limits. Prior sternotomy and CABG. Aortic atherosclerosis. Persistently coarsened interstitial markings bilaterally, similar to prior. No lobar consolidation. No pleural effusion or pneumothorax. IMPRESSION:  Persistently coarsened interstitial markings bilaterally, similar to prior. Appearance favors underlying interstitial lung disease over pulmonary edema. No lobar consolidation. Electronically Signed   By: Davina Poke D.O.   On: 01/24/2023 11:45     Medications:       Assessment/ Plan:  [name] is a 77 y.o. 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 Admitted to [facility] on 01/24/2023 for sob cough   [facility] CCPD 9 hours 4 exchanges 2.5 liter fills tidal 80% 69kg 6 days a week.  End Stage Renal Disease on peritoneal dialysis. Resume home orders. Monitor volume status. Pulmonary process does not seem to be secondary to volume overload. Suggest further work up.   Hypertension: home regimen of carvedilol, amlodipine, hydralazine, isosorbide mononitrate, losartan, and torsemide.   Hyponatremia: patient appears mostly euvolemic on examination. This could be due to pulmonary process. Will continue to monitor.  Anemia with chronic kidney disease: macrocytic. Receiving IV iron and Micera as outpatient.   Secondary Hyperparathyroidism: not currently on binders. Outpatient labs are at goal.   Diabetes mellitus type II with chronic kidney disease: insulin dependent. Hemoglobin A1c of 7.3% on 12/28/22. Complicated with diabetic retinopathy. Continue glucose control.    LOS: 0 Alexandria Moody 2/7/202412:16 PM

## 2023-01-24 NOTE — Assessment & Plan Note (Signed)
-   Nephrology consulted by EDP for continuation of peritoneal dialysis

## 2023-01-24 NOTE — Assessment & Plan Note (Signed)
-   Rosuvastatin 20 mg daily resumed 

## 2023-01-24 NOTE — Assessment & Plan Note (Addendum)
Etiology workup in progress, differentials include new onset heart failure versus pneumonia - Complete echo ordered, strict I's and O's - Furosemide 40 mg IV one-time dose - Procalcitonin elevated, azithromycin and ceftriaxone ordered - If patient does not improve with above treatments, recommend a.m. team to consult pulmonology for further workup regarding interstitial lung disease - Albuterol nebulizer every 6 hours as needed for shortness of breath and wheezing, 3 days ordered - Admit to telemetry medical, inpatient

## 2023-01-24 NOTE — Assessment & Plan Note (Deleted)
-   I suspect this is new onset heart failure - Complete echo ordered, strict I's and O's - Furosemide 40 mg IV one-time dose - If patient does not improve with above treatments, recommend a.m. team to consult pulmonology for further workup regarding interstitial lung disease - Admit to telemetry medical, inpatient

## 2023-01-24 NOTE — Assessment & Plan Note (Signed)
-   Isosorbide mononitrate 120 mg nightly resumed - Losartan 100 mg daily resumed for 2/8 - Hydralazine 5 mg IV every 8 hours as needed for SBP greater than 180

## 2023-01-24 NOTE — Assessment & Plan Note (Addendum)
-   Home long-acting insulin equivalent to 16 units every morning resumed - Insulin SSI with at bedtime coverage ordered, end-stage renal dosing, goal inpatient blood glucose levels 140-180

## 2023-01-24 NOTE — ED Notes (Signed)
Will give lasix once verified by pharmacy.

## 2023-01-24 NOTE — H&P (Addendum)
History and Physical   Alexandria Moody HQP:[id number] DOB: 1946/10/04 DOA: 01/24/2023  PCP: Bubba Camp, FNP  Patient coming from: Home  I have personally briefly reviewed patient's old medical records in [place].  Chief Concern: Shortness of breath  HPI: [name] is a 77 year old female with history of end-stage renal disease on peritoneal dialysis, CAD, insulin-dependent diabetes mellitus, hypertension, hypothyroid, GERD, hyperlipidemia, who presents emergency department for chief concerns of shortness of breath.  Initial vitals in the ED showed temperature of 97.5, respiration rate of 20, heart rate of 60, blood pressure 164/59, SpO2 of 100% on room air.  Serum sodium is 134, potassium 3.8, chloride 97, bicarb 25, BUN of 61, serum creatinine of 2.74, EGFR 18, nonfasting blood glucose 206, WBC 9.0, hemoglobin 10.8, platelets of 354.  BNP was elevated at 349.5.  High sensitivity troponin was 25.  Chest x-ray 2 view: Was read as coarsened interstitial markings bilaterally, similar to prior.  Appearance favors an underlying interstitial disease over pulmonary edema.  No lobar consolidation.  ED treatment: None -------------------------- At bedside, she is able to tell me her name, age, current location, current calendar year.   She reports shortness of breath, 2-3 days, worse with laying flat and with exertion.   She denies swelling of her lower extremities. She denies chest pain, fever, nausea, vomiting. She endorses increase abdominal swelling.   She endorses 3-4 days ago, productive cough of yellow and green sputum. She denies known sick contacts. She denies fever. She saw her pcp and was put on a z-pak yesterday (2 doses yesterday, and one pill today).   Social history: She lives on her own. She performs her own ADL (drives herself, does her own cooking, laundry). She is a former tobacco user, quiting about 30+ years ago. She denies etoh and recreational drug use.  She is retired and formerly worked as a Optometrist.   ROS: Constitutional: no weight change, no fever ENT/Mouth: no sore throat, no rhinorrhea Eyes: no eye pain, no vision changes Cardiovascular: no chest pain, + dyspnea,  no edema, no palpitations Respiratory: + cough, + sputum, no wheezing Gastrointestinal: no nausea, no vomiting, no diarrhea, no constipation Genitourinary: no urinary incontinence, no dysuria, no hematuria Musculoskeletal: no arthralgias, no myalgias Skin: no skin lesions, no pruritus, Neuro: + weakness, no loss of consciousness, no syncope Psych: no anxiety, no depression, no decrease appetite Heme/Lymph: no bruising, no bleeding  ED Course: Discussed with emergency medicine provider, patient requiring hospitalization for chief concerns of dyspnea on exertion.  Assessment/Plan  Principal Problem:   CAP (community acquired pneumonia) Active Problems:   Hypertension associated with type 2 diabetes mellitus (HCC)   PAD (peripheral artery disease) (HCC)   GERD (gastroesophageal reflux disease)   Hyperlipidemia associated with type 2 diabetes mellitus ([place])   Hypothyroidism (acquired)   Major depression in full remission ([place])   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HCC)   Insulin dependent type 2 diabetes mellitus ([place])   Dyspnea   ESRD on peritoneal dialysis ([place])   Assessment and Plan:  * CAP (community acquired pneumonia) - Elevated procalcitonin - Patient took 1 tablet of her Z-Pak prior to ED presentation - Ordered one-time dose of azithromycin 250 mg IV on day of admission, and ordered azithromycin 500 mg IV starting on 01/25/2023, to complete 5-day course - Ceftriaxone 1 g IV daily, 5 days course ordered - Flutter valve, incentive spirometry - Admit to telemetry medical, inpatient  ESRD on peritoneal dialysis [facility]) - Nephrology consulted by EDP  for continuation of peritoneal dialysis  Dyspnea Etiology workup in progress, differentials include new  onset heart failure versus pneumonia - Complete echo ordered, strict I's and O's - Furosemide 40 mg IV one-time dose - Procalcitonin elevated, azithromycin and ceftriaxone ordered - If patient does not improve with above treatments, recommend a.m. team to consult pulmonology for further workup regarding interstitial lung disease - Albuterol nebulizer every 6 hours as needed for shortness of breath and wheezing, 3 days ordered - Admit to telemetry medical, inpatient  Insulin dependent type 2 diabetes mellitus ([place]) - Home long-acting insulin equivalent to 16 units every morning resumed - Insulin SSI with at bedtime coverage ordered, end-stage renal dosing, goal inpatient blood glucose levels 140-180  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HCC) - Resumed home hydroxychloroquine 200 mg daily  Major depression in full remission ([place]) - Resumed home citalopram 20 mg daily  Hypothyroidism (acquired) - Levothyroxine 75 mcg daily before breakfast resumed  Hyperlipidemia associated with type 2 diabetes mellitus (HCC) - Rosuvastatin 20 mg daily resumed  GERD (gastroesophageal reflux disease) - PPI  Hypertension associated with type 2 diabetes mellitus (HCC) - Isosorbide mononitrate 120 mg nightly resumed - Losartan 100 mg daily resumed for 2/8 - Hydralazine 5 mg IV every 8 hours as needed for SBP greater than 180  Chart reviewed.   DVT prophylaxis: Heparin 5000 units subcutaneous every 8 hours Code Status: Full code Diet: Renal/carb modified Family Communication: Updated Helene Kelp, daughter at bedside with patient's permission Disposition Plan: Pending clinical course Consults called: Nephrology per EDP Admission status: Telemetry medical, inpatient  Past Medical History:  Diagnosis Date   Anemia of chronic renal failure    a.) recieving iron infusions + epoetin alfa-epbx (Retacrit)   Anginal pain (HCC)    Aortic atherosclerosis (HCC)    Arthritis    Atherosclerosis of native arteries of  extremity with intermittent claudication ([place])    a.) ABI/TBI 06/24/2021: 0.95 RIGHT, 0.72 LEFT   Bilateral carotid artery disease ([place]) 07/25/2021   a.) carotid doppler 07/25/2021: < 97% RICA, < 35% LICA.   Carotid atherosclerosis, bilateral    CHF (congestive heart failure) ([place])    a.) TTE 05/12/2020: LVEF 55-60%; mild LA enlargement; trivial to mild MR; G1DD.   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   a.) smolding phase ANCA associated pauci immune   Heart murmur    HLD (hyperlipidemia)    Hypertension    Hypothyroidism    Long term current use of immunosuppressive drug    a.) hydroxychloriquine for ANCA (+) glomerulonephritis   Macular degeneration    PAD (peripheral artery disease) ([place])    Pneumonia 11/2021   S/P CABG x 3 2002   a.) LIMA-LAD, SVG-diagnoal, SVG-OM   Skin cancer    T2DM (type 2 diabetes mellitus) ([place])    TIA (transient ischemic attack)    Past Surgical History:  Procedure Laterality Date   ABDOMINAL HYSTERECTOMY     age 49   CAPD INSERTION N/A 02/03/2022   Procedure: LAPAROSCOPIC INSERTION CONTINUOUS AMBULATORY PERITONEAL DIALYSIS  (CAPD) CATHETER;  Surgeon: Jules Husbands, MD;  Location: [facility] ORS;  Service: General;  Laterality: N/A;  Provider requesting 1.5 hours / 90 minutes for procedure.   CATARACT EXTRACTION Bilateral 2014  CHOLECYSTECTOMY  2020   COLONOSCOPY     CORONARY ARTERY BYPASS GRAFT N/A 2002   Procedure: 3v CORONARY ARTERY ARTERY BYPASS GRAFT   EYE SURGERY Left 07/22/2020   Dr. Towanda Octave, Vit and Marta Lamas   Social History:  reports that she quit smoking about 24 years ago. Her smoking use included cigarettes. She has a 50.00 pack-year smoking history. She has  been exposed to tobacco smoke. She has never used smokeless tobacco. She reports that she does not drink alcohol and does not use drugs.  Allergies  Allergen Reactions   Tape     Tears skin   History reviewed. No pertinent family history. Family history: Family history reviewed and not pertinent.  Prior to Admission medications   Medication Sig Start Date End Date Taking? Authorizing Provider  albuterol (VENTOLIN HFA) 108 (90 Base) MCG/ACT inhaler Inhale 2 puffs into the lungs every 6 (six) hours as needed for wheezing or shortness of breath. 07/22/18   [provider]  amLODipine (NORVASC) 5 MG tablet Take 5 mg by mouth 2 (two) times daily. 10/03/19   [provider]  aspirin 81 MG EC tablet Take 81 mg by mouth daily.    [provider]  calcitRIOL (ROCALTROL) 0.25 MCG capsule Take 0.25 mcg by mouth daily. 07/20/15   [provider]  Cholecalciferol 125 MCG (5000 UT) capsule Take 5,000 Units by mouth daily.    [provider]  citalopram (CELEXA) 20 MG tablet Take 20 mg by mouth every morning. 02/26/19   [provider]  fluocinonide (LIDEX) 0.05 % external solution Apply 1 application topically 2 (two) times daily as needed (scalp irritation).    [provider]  hydrALAZINE (APRESOLINE) 50 MG tablet Take 50 mg by mouth in the morning and at bedtime. 05/18/20   [provider]  hydroxychloroquine (PLAQUENIL) 200 MG tablet Take 200 mg by mouth daily. 09/27/21   [provider]  insulin glargine, 1 Unit Dial, (TOUJEO SOLOSTAR) 300 UNIT/ML Solostar Pen Inject 32 Units into the skin every morning. 03/26/20   [provider]  Insulin Pen Needle (BD PEN NEEDLE NANO 2ND GEN) 32G X 4 MM MISC daily. 05/05/21   [provider]  isosorbide mononitrate (IMDUR) 120 MG 24 hr tablet Take 120 mg by mouth at bedtime. 04/23/20   [provider]  levothyroxine (SYNTHROID) 75 MCG tablet Take 75 mcg by mouth daily  before breakfast. 04/05/20   [provider]  linagliptin (TRADJENTA) 5 MG TABS tablet Take 5 mg by mouth every morning. 03/09/20   [provider]  losartan (COZAAR) 25 MG tablet Take 25 mg by mouth every morning. 03/03/21   [provider]  nitroGLYCERIN (NITROSTAT) 0.4 MG SL tablet Place 0.4 mg under the tongue every 5 (five) minutes as needed for chest pain. 09/10/20   [provider]  Omega-3 Fatty Acids (FISH OIL) 1000 MG CAPS Take 1,000 mg by mouth in the morning and at bedtime.    [provider]  omeprazole (PRILOSEC) 20 MG capsule Take 20 mg by mouth every morning. 07/05/18   [provider]  ranolazine (RANEXA) 500 MG 12 hr tablet Take 500 mg by mouth 2 (two) times daily. 04/05/20   [provider]  rosuvastatin (CRESTOR) 20 MG tablet Take 20 mg by mouth every morning. 03/31/21   [provider]  torsemide (DEMADEX) 20 MG tablet Take 20 mg by mouth every morning. 02/09/21 02/09/22  [provider]   Physical Exam:  Vitals:   01/24/23 1026 01/24/23 1400 01/24/23 1430 01/24/23 1500  BP: (!) 164/59 (!) 143/43 (!) 138/56 (!) 131/58  Pulse: 60 63 64 65  Resp: '20 14 15 14  '$ Temp: (!) 97.5 F (36.4 C)     TempSrc: Oral     SpO2: 100% 98% 97% 94%  Weight: 68.5 kg     Height: '5\' 3"'$  (1.6 m)      Constitutional: appears age-appropriate, NAD, calm, comfortable Eyes: PERRL, lids and conjunctivae normal ENMT: Mucous membranes are moist. Posterior pharynx clear of any exudate or lesions. Age-appropriate dentition. Hearing appropriate Neck: normal, supple, no masses, no thyromegaly Respiratory: clear to auscultation bilaterally, no wheezing, no crackles. Normal respiratory effort. No accessory muscle use.  Cardiovascular: Regular rate and rhythm, no murmurs / rubs / gallops. No extremity edema. 2+ pedal pulses. No carotid bruits.  Abdomen: no tenderness, no masses palpated, no hepatosplenomegaly. Bowel sounds positive.   Peritoneal dialysis catheter in place. Musculoskeletal: no clubbing / cyanosis. No joint deformity upper and lower extremities. Good ROM, no contractures, no atrophy. Normal muscle tone.  Skin: no rashes, lesions, ulcers. No induration Neurologic: Sensation intact. Strength 5/5 in all 4.  Psychiatric: Normal judgment and insight. Alert and oriented x 3. Normal mood.   EKG: independently reviewed, showing sinus rhythm with rate of 63, QTc 491  Chest x-ray on Admission: I personally reviewed and I agree with radiologist reading as below.  DG Chest 2 View  Result Date: 01/24/2023 CLINICAL DATA:  Shortness of breath EXAM: CHEST - 2 VIEW COMPARISON:  11/14/2021 FINDINGS: Heart size within normal limits. Prior sternotomy and CABG. Aortic atherosclerosis. Persistently coarsened interstitial markings bilaterally, similar to prior. No lobar consolidation. No pleural effusion or pneumothorax. IMPRESSION: Persistently coarsened interstitial markings bilaterally, similar to prior. Appearance favors underlying interstitial lung disease over pulmonary edema. No lobar consolidation. Electronically Signed   By: Davina Poke D.O.   On: 01/24/2023 11:45    Labs on Admission: I have personally reviewed following labs  CBC: Recent Labs  Lab 01/24/23 1042  WBC 9.0  HGB 10.8*  HCT 32.9*  MCV 102.5*  PLT 161   Basic Metabolic Panel: Recent Labs  Lab 01/24/23 1042  NA 134*  K 3.8  CL 97*  CO2 25  GLUCOSE 206*  BUN 61*  CREATININE 2.74*  CALCIUM 8.4*   GFR: Estimated Creatinine Clearance: 16.7 mL/min (A) (by C-G formula based on SCr of 2.74 mg/dL (H)).  Liver Function Tests: Recent Labs  Lab 01/24/23 1042  AST 26  ALT 17  ALKPHOS 82  BILITOT 0.7  PROT 6.9  ALBUMIN 2.4*   CBG: Recent Labs  Lab 01/24/23 1040  GLUCAP 205*   Urine analysis:    Component Value Date/Time   COLORURINE YELLOW (A) 02/09/2022 1956   APPEARANCEUR CLOUDY (A) 02/09/2022 1956   LABSPEC 1.010 02/09/2022  1956   PHURINE 5.0 02/09/2022 1956   GLUCOSEU NEGATIVE 02/09/2022 1956   HGBUR MODERATE (A) 02/09/2022 1956   BILIRUBINUR NEGATIVE 02/09/2022 [place] NEGATIVE 02/09/2022 1956   PROTEINUR 100 (A) 02/09/2022 1956   NITRITE NEGATIVE 02/09/2022 1956   LEUKOCYTESUR LARGE (A) 02/09/2022 1956   This document was prepared using Dragon Voice Recognition software and may include unintentional dictation errors.  Dr. Tobie Poet [facility]  If 7PM-7AM, please contact overnight-coverage provider If 7AM-7PM, please contact day coverage provider [email or web address]  01/24/2023, 4:18 PM

## 2023-01-25 ENCOUNTER — Inpatient Hospital Stay
Admit: 2023-01-25 | Discharge: 2023-01-25 | Disposition: A | Discharge status: Home / Self Care | Payer: Medicare PPO | Attending: Internal Medicine | Admitting: Internal Medicine

## 2023-01-25 DIAGNOSIS — T17500A Unspecified foreign body in bronchus causing asphyxiation, initial encounter: Secondary | ICD-10-CM

## 2023-01-25 DIAGNOSIS — I509 Heart failure, unspecified: Secondary | ICD-10-CM

## 2023-01-25 LAB — ECHOCARDIOGRAM COMPLETE
AR max vel: 1.4 cm2
AV Area VTI: 1.87 cm2
AV Area mean vel: 1.4 cm2
AV Mean grad: 10.3 mmHg
AV Peak grad: 18.4 mmHg
Ao pk vel: 2.14 m/s
Area-P 1/2: 4.96 cm2
Height: 63 in
S' Lateral: 3 cm
Weight: 2416 oz

## 2023-01-25 LAB — BASIC METABOLIC PANEL
Anion gap: 9 (ref 5–15)
BUN: 55 mg/dL — ABNORMAL HIGH (ref 8–23)
CO2: 26 mmol/L (ref 22–32)
Calcium: 7.9 mg/dL — ABNORMAL LOW (ref 8.9–10.3)
Chloride: 99 mmol/L (ref 98–111)
Creatinine, Ser: 2.78 mg/dL — ABNORMAL HIGH (ref 0.44–1.00)
GFR, Estimated: 17 mL/min — ABNORMAL LOW (ref >60)
Glucose, Bld: 266 mg/dL — ABNORMAL HIGH (ref 70–99)
Potassium: 3.1 mmol/L — ABNORMAL LOW (ref 3.5–5.1)
Sodium: 134 mmol/L — ABNORMAL LOW (ref 135–145)

## 2023-01-25 LAB — GLUCOSE, CAPILLARY
Glucose-Capillary: 215 mg/dL — ABNORMAL HIGH (ref 70–99)
Glucose-Capillary: 233 mg/dL — ABNORMAL HIGH (ref 70–99)

## 2023-01-25 LAB — CBC
HCT: 28.1 % — ABNORMAL LOW (ref 36.0–46.0)
Hemoglobin: 9.5 g/dL — ABNORMAL LOW (ref 12.0–15.0)
MCH: 34.2 pg — ABNORMAL HIGH (ref 26.0–34.0)
MCHC: 33.8 g/dL (ref 30.0–36.0)
MCV: 101.1 fL — ABNORMAL HIGH (ref 80.0–100.0)
Platelets: 297 10*3/uL (ref 150–400)
RBC: 2.78 MIL/uL — ABNORMAL LOW (ref 3.87–5.11)
RDW: 13.4 % (ref 11.5–15.5)
WBC: 8 10*3/uL (ref 4.0–10.5)
nRBC: 0 % (ref 0.0–0.2)

## 2023-01-25 LAB — HEMOGLOBIN A1C
Hgb A1c MFr Bld: 7.4 % — ABNORMAL HIGH (ref 4.8–5.6)
Mean Plasma Glucose: 165.68 mg/dL

## 2023-01-25 LAB — PROCALCITONIN: Procalcitonin: 0.35 ng/mL

## 2023-01-25 MED ORDER — PREDNISONE 20 MG PO TABS: 20.0000 mg | ORAL_TABLET | Freq: Every day | ORAL | 0 refills | Status: AC

## 2023-01-25 MED ORDER — PREDNISONE 20 MG PO TABS
20.0000 mg | ORAL_TABLET | Freq: Every day | ORAL | Status: DC
  Administered 2023-01-25: 20 mg via ORAL
  Filled 2023-01-25: qty 1

## 2023-01-25 MED ORDER — FLUTICASONE FUROATE-VILANTEROL 200-25 MCG/ACT IN AEPB: 1.0000 | INHALATION_SPRAY | Freq: Every day | RESPIRATORY_TRACT | 0 refills | Status: AC

## 2023-01-25 MED ORDER — GUAIFENESIN ER 600 MG PO TB12: 600.0000 mg | ORAL_TABLET | Freq: Two times a day (BID) | ORAL | 0 refills | Status: AC

## 2023-01-25 MED ORDER — POTASSIUM CHLORIDE 20 MEQ PO PACK
40.0000 meq | PACK | Freq: Once | ORAL | Status: AC
  Administered 2023-01-25: 40 meq via ORAL
  Filled 2023-01-25: qty 2

## 2023-01-25 NOTE — Care Management CC44 (Signed)
Condition Code 44 Documentation Completed  Patient Details  Name: Alexandria Moody MRN: [id number] Date of Birth: September 30, 1946   Condition Code 44 given:  Yes Patient signature on Condition Code 44 notice:  Yes Documentation of 2 MD's agreement:  Yes Code 44 added to claim:  Yes    Beverly Sessions, RN 01/25/2023, 11:27 AM

## 2023-01-25 NOTE — Progress Notes (Signed)
*  PRELIMINARY RESULTS* Echocardiogram 2D Echocardiogram has been performed.  Alexandria Moody 01/25/2023, 8:01 AM

## 2023-01-25 NOTE — Discharge Summary (Signed)
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   Specialty: Family Medicine Why: 02/01/23 10:30 AM Contact information: [address] [place] [address] [phone]                Diet recommendation: Renal diet  Activity: The patient is advised to gradually reintroduce usual activities, as tolerated  Discharge Condition: stable  Code Status: Full code   History of present illness: As per the H and P dictated on admission  Hospital Course:  Alexandria Moody is a 77 year old female with history of end-stage renal disease on peritoneal dialysis, CAD, insulin-dependent diabetes mellitus, hypertension, hypothyroid, GERD, hyperlipidemia, who presents emergency department for chief concerns of shortness of breath. Initial vitals in the ED: temp 97.5, RR 20, HR 60, BP 164/59, SpO2 of 100% on room air. Serum Na 134, K 3.8, chloride 97, bicarb 25, BUN of 61, serum creatinine of 2.74, EGFR 18, nonfasting blood glucose 206, WBC  9.0, hemoglobin 10.8, platelets of 354. BNP was elevated at 349.5.  High sensitivity troponin was 25. Chest x-ray 2 view: Was read as coarsened interstitial markings bilaterally, similar to prior. Appearance favors an underlying interstitial disease over pulmonary edema.  No lobar consolidation.  Assessment and Plan: # Bronchitis, less likely CAP (community acquired pneumonia) Patient presented with dyspnea and persistent productive cough for past 5 days. Slightly elevated procalcitonin, wbc wnl. CXR shows possible interstitial lung disease. Patient took 1 tablet of her Z-Pak prior to ED presentation. S/p Azithromycin 250 mg IV on day of admission, and azithromycin 500 mg IV on 01/25/2023 given and patient was also given ceftriaxone 1 g IV.  Patient was advised to continue Z-Pak at home, prescribed Mucinex 600 mg p.o. twice daily for 10 days, started prednisone 20 mg p.o. daily, prescribed for 4 additional days to complete 5-day course.  Started Breo Ellipta inhaler, advised to use albuterol as needed.  Recommended to follow with PCP and pulmonologist for PFTs in 1 to 2 weeks as an outpatient.  Follow with PCP for 2D echocardiogram as an outpatient. # ESRD on peritoneal dialysis, Nephrology was consulted by EDP, and peritoneal dialysis was continued at nighttime. # Insulin dependent type 2 diabetes mellitus, resumed home regimen on discharge.  Advised to monitor FSBG at home, remains at high risk for hypoglycemia due to small dose of steroids.  Continue diabetic diet further management as an outpatient. # ANCA-associated vasculitis, Resumed home hydroxychloroquine 200 mg daily # Major depression in full remission, Resumed home citalopram 20 mg daily # Hypothyroidism (acquired) Levothyroxine  75 mcg daily before breakfast resumed # Hyperlipidemia associated with type 2 DM, Rosuvastatin 20 mg daily resumed # GERD (gastroesophageal reflux disease), PPI # Hypertension associated with T2DM, Isosorbide  mononitrate 120 mg nightly resumed Losartan 100 mg daily resumed for 2/8 Hydralazine 5 mg IV every 8 hours as needed for SBP greater than 180  Body mass index is 26.75 kg/m.  Nutrition Interventions:    Patient was ambulatory without any assistance. On the day of the discharge the patient's vitals were stable, and no other acute medical condition were reported by patient. the patient was felt safe to be discharge at Home.  Consultants: Nephrology Procedures: Peritoneal dialysis  Discharge Exam: General: Appear in no distress, no Rash; Oral Mucosa Clear, moist. Cardiovascular: S1 and S2 Present, no Murmur, Respiratory: normal respiratory effort, Bilateral Air entry present and no Crackles, mild wheezing on deep breathing  Abdomen: Bowel Sound present, Soft and no tenderness, no hernia Extremities: no Pedal edema, no calf tenderness Neurology: alert and oriented to time, place, and person affect appropriate.  Filed Weights   01/24/23 1026  Weight: 68.5 kg   Vitals:   01/25/23 0324 01/25/23 0939  BP: (!) 169/66 131/66  Pulse: 64 84  Resp: 18 16  Temp: 98.3 F (36.8 C) 98.1 F (36.7 C)  SpO2: 100% 98%    DISCHARGE MEDICATION: Allergies as of 01/25/2023       Reactions   Tape    Tears skin        Medication List     STOP taking these medications    linagliptin 5 MG Tabs tablet Commonly known as: TRADJENTA       TAKE these medications    albuterol 108 (90 Base) MCG/ACT inhaler Commonly known as: VENTOLIN HFA Inhale 2 puffs into the lungs every 6 (six) hours as needed for wheezing or shortness of breath.   amLODipine 5 MG tablet Commonly known as: NORVASC Take 5 mg by mouth 2 (two) times daily.   aspirin EC 81 MG tablet Take 81 mg by mouth daily.   BD Pen Needle Nano 2nd Gen 32G X 4 MM Misc Generic drug: Insulin Pen Needle daily.   calcitRIOL 0.25 MCG capsule Commonly known as: ROCALTROL Take 0.25 mcg by mouth daily.   Cholecalciferol 125 MCG  (5000 UT) capsule Take 5,000 Units by mouth daily.   citalopram 20 MG tablet Commonly known as: CELEXA Take 20 mg by mouth every morning.   Fish Oil 1000 MG Caps Take 1,000 mg by mouth in the morning and at bedtime.   fluocinonide 0.05 % external solution Commonly known as: LIDEX Apply 1 application topically 2 (two) times daily as needed (scalp irritation).   fluticasone furoate-vilanterol 200-25 MCG/ACT Aepb Commonly known as: Breo Ellipta Inhale 1 puff into the lungs daily.   guaiFENesin 600 MG 12 hr tablet Commonly known as: Mucinex Take 1 tablet (600 mg total) by mouth 2 (two) times daily for 10 days.   hydrALAZINE 50 MG tablet Commonly known as: APRESOLINE Take 50 mg by mouth in the morning and at bedtime.   hydroxychloroquine 200 MG tablet Commonly known as: PLAQUENIL Take 200 mg by mouth daily.   isosorbide mononitrate 120 MG 24 hr tablet Commonly known as: IMDUR Take 120 mg by mouth at bedtime.   levothyroxine 75 MCG tablet Commonly known as: SYNTHROID Take 75 mcg by mouth daily before breakfast.   losartan 25 MG tablet Commonly known as: COZAAR Take 100 mg by mouth daily.   nitroGLYCERIN  0.4 MG SL tablet Commonly known as: NITROSTAT Place 0.4 mg under the tongue every 5 (five) minutes as needed for chest pain.   omeprazole 20 MG capsule Commonly known as: PRILOSEC Take 20 mg by mouth every morning.   predniSONE 20 MG tablet Commonly known as: DELTASONE Take 1 tablet (20 mg total) by mouth daily with breakfast for 4 days.   ranolazine 500 MG 12 hr tablet Commonly known as: RANEXA Take 500 mg by mouth 2 (two) times daily.   rosuvastatin 20 MG tablet Commonly known as: CRESTOR Take 20 mg by mouth every morning.   torsemide 20 MG tablet Commonly known as: DEMADEX Take 20 mg by mouth every morning.   Toujeo SoloStar 300 UNIT/ML Solostar Pen Generic drug: insulin glargine (1 Unit Dial) Inject 16 Units into the skin every morning.        Allergies  Allergen Reactions   Tape     Tears skin   Discharge Instructions     Call MD for:  difficulty breathing, headache or visual disturbances   Complete by: As directed    Call MD for:  extreme fatigue   Complete by: As directed    Call MD for:  persistant dizziness or light-headedness   Complete by: As directed    Call MD for:  persistant nausea and vomiting   Complete by: As directed    Call MD for:  severe uncontrolled pain   Complete by: As directed    Call MD for:  temperature >100.4   Complete by: As directed    Diet - low sodium heart healthy   Complete by: As directed    Discharge instructions   Complete by: As directed    Follow-up with PCP in 1 week, patient was advised to continue Z-Pak, prescribed prednisone low-dose 20 mg p.o. daily for 4 additional days, started Breo Ellipta inhaler, continue albuterol inhaler.  Mucinex 600 mg p.o. twice daily for 5 to 10 days.   Increase activity slowly   Complete by: As directed    No wound care   Complete by: As directed        The results of significant diagnostics from this hospitalization (including imaging, microbiology, ancillary and laboratory) are listed below for reference.    Significant Diagnostic Studies: DG Chest 2 View  Result Date: 01/24/2023 CLINICAL DATA:  Shortness of breath EXAM: CHEST - 2 VIEW COMPARISON:  11/14/2021 FINDINGS: Heart size within normal limits. Prior sternotomy and CABG. Aortic atherosclerosis. Persistently coarsened interstitial markings bilaterally, similar to prior. No lobar consolidation. No pleural effusion or pneumothorax. IMPRESSION: Persistently coarsened interstitial markings bilaterally, similar to prior. Appearance favors underlying interstitial lung disease over pulmonary edema. No lobar consolidation. Electronically Signed   By: Davina Poke D.O.   On: 01/24/2023 11:45    Microbiology: No results found for this or any previous visit (from the past 240 hour(s)).    Labs: CBC: Recent Labs  Lab 01/24/23 1042 01/25/23 0509  WBC 9.0 8.0  HGB 10.8* 9.5*  HCT 32.9* 28.1*  MCV 102.5* 101.1*  PLT 354 726   Basic Metabolic Panel: Recent Labs  Lab 01/24/23 1042 01/25/23 0509  NA 134* 134*  K 3.8 3.1*  CL 97* 99  CO2 25 26  GLUCOSE 206* 266*  BUN 61* 55*  CREATININE 2.74* 2.78*  CALCIUM 8.4* 7.9*   Liver Function Tests: Recent Labs  Lab 01/24/23 1042  AST 26  ALT 17  ALKPHOS 82  BILITOT 0.7  PROT 6.9  ALBUMIN 2.4*   No results for input(s): "LIPASE", "AMYLASE" in the last 168 hours. No results for input(s): "AMMONIA" in the last 168 hours. Cardiac Enzymes: No results for input(s): "CKTOTAL", "CKMB", "CKMBINDEX", "TROPONINI" in the last 168 hours. BNP (last 3 results) Recent Labs    01/24/23 1042  BNP 349.5*   CBG: Recent Labs  Lab 01/24/23 1040 01/24/23 1710 01/24/23 2109 01/25/23 0941  GLUCAP 205* 186* 169* 215*    Time spent: 35 minutes  Signed:  Val Riles  [facility] 01/25/2023 11:31 AM

## 2023-01-25 NOTE — Plan of Care (Signed)
  Problem: Clinical Measurements: Goal: Respiratory complications will improve Outcome: Progressing   

## 2023-01-25 NOTE — Plan of Care (Signed)
Patient discharged to home with family.  Discharge packet given and patient verbalized understanding.  PIV removed.

## 2023-01-25 NOTE — Progress Notes (Signed)
PT completed PD tx last night with no issues. Effluent Yellow and clear. Exit site in perfect condition and dressing changed.  UF = 530 ml    01/25/23 0735  Completion  Treatment Status Complete  Initial Drain Volume 37  Average Dwell Time-Hour(s) 2  Average Drain Time 22  Total Therapy Volume 10001  Total Therapy Time-Hour(s) 10  Total Therapy Time-Min(s) 24  Effluent Appearance Yellow;Clear  Fluid Balance - CCPD  Total UF (+ value on cycler, pt loss) 530 mL  Procedure Comments  Tolerated treatment well? Yes  Peritoneal Dialysis Comments No issues  Education / Care Plan  Dialysis Education Provided Yes  Hand-off documentation  Hand-off Received Received from shift RN/LPN  Report received from (Full Name) Delsa Sale RN

## 2023-01-25 NOTE — TOC Initial Note (Signed)
Transition of Care [facility]) - Initial/Assessment Note    Patient Details  Name: Alexandria Moody MRN: [id number] Date of Birth: 1946-08-18  Transition of Care [facility]) CM/SW Contact:    Alexandria Sessions, RN Phone Number: 01/25/2023, 11:30 AM  Clinical Narrative:                  Admitted for: PNA Admitted from: Home alone PCP: Alexandria Moody  Current home health/prior home health/DME: NA.  Independent, drives.  States she has no needs at home  Patient completes her own PD Alexandria Moody HD liaison notified of admission and discharge.         Patient Goals and CMS Choice            Expected Discharge Plan and Services         Expected Discharge Date: 01/25/23                                    Prior Living Arrangements/Services                       Activities of Daily Living Home Assistive Devices/Equipment: Eyeglasses ADL Screening (condition at time of admission) Patient's cognitive ability adequate to safely complete daily activities?: Yes Is the patient deaf or have difficulty hearing?: No Does the patient have difficulty seeing, even when wearing glasses/contacts?: No Does the patient have difficulty concentrating, remembering, or making decisions?: No Patient able to express need for assistance with ADLs?: Yes Does the patient have difficulty dressing or bathing?: No Independently performs ADLs?: Yes (appropriate for developmental age) Does the patient have difficulty walking or climbing stairs?: No Weakness of Legs: None Weakness of Arms/Hands: None  Permission Sought/Granted                  Emotional Assessment              Admission diagnosis:  Shortness of breath [R06.02] Interstitial lung disease ([place]) [J84.9] ESRD (end stage renal disease) ([place]) [N18.6] Acute exacerbation of CHF (congestive heart failure) ([place]) [I50.9] Patient Active Problem List   Diagnosis Date Noted   Acute exacerbation of CHF (congestive heart failure)  ([place]) 01/25/2023   Insulin dependent type 2 diabetes mellitus ([place]) 01/24/2023   CAP (community acquired pneumonia) 01/24/2023   Dyspnea 01/24/2023   ESRD on peritoneal dialysis ([place]) 01/24/2023   Nonexudative age-related macular degeneration, right eye, early dry stage 10/04/2021   Exudative age-related macular degeneration of left eye with active choroidal neovascularization ([place]) 10/04/2021   Macrocytosis 05/10/2021   Stage 4 chronic kidney disease ([place]) 05/10/2021   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[place]) 04/05/2021   Osteoporosis, 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/2021   Secondary hyperparathyroidism, renal ([place]) 03/24/2021   B12 deficiency 02/01/2021   Senile purpura ([place]) 12/24/2020   Carotid atherosclerosis, bilateral 12/21/2020   Chronic diastolic congestive heart failure ([place]) 12/21/2020   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 ([place]) 12/21/2020   GERD (gastroesophageal reflux disease) 12/21/2020   Hypothyroidism (acquired) 12/21/2020   Major depression in full remission ([place]) 12/21/2020   Seborrheic dermatitis 12/21/2020   Sensorineural hearing loss (SNHL) of both ears 12/21/2020   Thyroid disease 12/21/2020   Vitamin D deficiency 12/21/2020   Postoperative follow-up 12/09/2020   Cystoid macular edema of right eye 11/16/2020   Macular pucker, right eye 10/12/2020   Atherosclerosis of abdominal aorta ([place]) 09/10/2020   Bronchiectasis without complication ([place]) 24/08/7352  High risk medication use 09/10/2020   Severe nonproliferative diabetic retinopathy of left eye, with macular edema, associated with type 2 diabetes mellitus ([place]) 09/07/2020   Cystoid macular edema of left eye 09/07/2020   History of vitrectomy 09/07/2020   Moderate nonproliferative diabetic retinopathy of right eye ([place]) 09/07/2020   Essential hypertension 11/18/2019   Fatigue 11/18/2019   Stage 3b chronic kidney disease ([place]) 11/18/2019   Non-rheumatic aortic sclerosis 07/22/2018    Adenomatous polyp of colon 10/10/2017   Chronic obstructive pulmonary disease ([place]) 03/28/2017   Osteopenia of multiple sites 06/05/2016   PAD (peripheral artery disease) ([place]) 08/17/2015   Atherosclerosis of native arteries of extremity with intermittent claudication ([place]) 06/29/2015   Myalgia 06/29/2015   Stable angina 02/23/2015   TIA (transient ischemic attack) 01/18/2015   Anemia in chronic kidney disease 08/20/2014   Anemia in stage 4 chronic kidney disease ([place]) 08/20/2014   Dizziness 07/24/2014   DOE (dyspnea on exertion) 07/24/2014   Calculus of gallbladder w/o mention of cholecystitis or obstruction 01/05/2014   Contusion of right knee 11/10/2013   Facial contusion 11/10/2013   Fall 11/10/2013   Left elbow pain 11/10/2013   Left radial head fracture 11/10/2013   Right knee pain 11/10/2013   Occlusion and stenosis of carotid artery 06/23/2013   Stenosis of carotid artery 06/23/2013   Syncope 03/09/2013   Anemia of chronic disease 11/01/2012   Chronic kidney disease, stage 4 (severe) ([place]) 09/11/2012   Anemia 06/24/2012   Type 2 diabetes mellitus ([place]) 07/29/2011   Abnormal cardiovascular stress test 07/29/2011   Coronary artery disease involving native heart 07/29/2011   Hypertension associated with type 2 diabetes mellitus ([place]) 07/29/2011   Hyperlipidemia associated with type 2 diabetes mellitus ([place]) 07/29/2011   Obesity (BMI 30-39.9) 07/29/2011   Situational stress 07/29/2011   PCP:  Alexandria Moody, [place] Pharmacy:   [id number] [place], [id number]: [phone]: [phone]    Social Determinants of Health (SDOH) Social History: SDOH Screenings   Food Insecurity: No Food Insecurity (01/24/2023)  Housing: Low Risk  (01/24/2023)  Transportation Needs: No Transportation Needs (01/24/2023)  Utilities: Not At Risk (01/24/2023)  Tobacco Use: Medium Risk (01/24/2023)   SDOH Interventions:      Readmission Risk Interventions     No data to display

## 2023-01-25 NOTE — Discharge Instructions (Addendum)

## 2023-01-25 NOTE — Care Management Obs Status (Signed)
[facility] NOTIFICATION   Patient Details  Name: Alexandria Moody MRN: [id number] Date of Birth: [birth year]   Medicare Observation Status Notification Given:  Yes    Beverly Sessions, RN 01/25/2023, 11:27 AM

## 2023-01-25 NOTE — Progress Notes (Signed)
[facility]  ROUNDING NOTE   Subjective:   [name] was admitted to [facility] on 01/24/2023 for Shortness of breath [R06.02] Interstitial lung disease ([place]) [J84.9] ESRD (end stage renal disease) ([place]) [N18.6] Acute exacerbation of CHF (congestive heart failure) ([place]) [I50.9]  Patient seen sitting up in chair Alert and oriented States feels better today Tremors have subsided Shortness of breath remains with activity   Objective:  Vital signs in last 24 hours:  Temp:  [97.7 F (36.5 C)-98.3 F (36.8 C)] 98.1 F (36.7 C) (02/08 0939) Pulse Rate:  [63-84] 84 (02/08 0939) Resp:  [14-18] 16 (02/08 0939) BP: (131-188)/(43-69) 131/66 (02/08 0939) SpO2:  [94 %-100 %] 98 % (02/08 0939)  Weight change:  Filed Weights   01/24/23 1026  Weight: 68.5 kg    Intake/Output: I/O last 3 completed shifts: In: 100 [P.O.:100] Out: -    Intake/Output this shift:  Total I/O In: 468.6 [P.O.:360; I.V.:8.6; IV Piggyback:100] Out: 530 [Other:530]  Physical Exam: General: NAD,   Head: Normocephalic, atraumatic. Moist oral mucosal membranes  Eyes: Anicteric  Lungs:  Coarse, normal effort  Heart: Regular rate and rhythm  Abdomen:  Soft, nontender  Extremities:  no peripheral edema.  Neurologic: Nonfocal, moving all four extremities  Skin: No lesions  Access: PD catheter    Basic Metabolic Panel: Recent Labs  Lab 01/24/23 1042 01/25/23 0509  NA 134* 134*  K 3.8 3.1*  CL 97* 99  CO2 25 26  GLUCOSE 206* 266*  BUN 61* 55*  CREATININE 2.74* 2.78*  CALCIUM 8.4* 7.9*     Liver Function Tests: Recent Labs  Lab 01/24/23 1042  AST 26  ALT 17  ALKPHOS 82  BILITOT 0.7  PROT 6.9  ALBUMIN 2.4*    No results for input(s): "LIPASE", "AMYLASE" in the last 168 hours. No results for input(s): "AMMONIA" in the last 168 hours.  CBC: Recent Labs  Lab 01/24/23 1042 01/25/23 0509  WBC 9.0 8.0  HGB 10.8* 9.5*  HCT 32.9* 28.1*  MCV 102.5* 101.1*  PLT 354 297      Cardiac Enzymes: No results for input(s): "CKTOTAL", "CKMB", "CKMBINDEX", "TROPONINI" in the last 168 hours.  BNP: Invalid input(s): "POCBNP"  CBG: Recent Labs  Lab 01/24/23 1040 01/24/23 1710 01/24/23 2109 01/25/23 0941  GLUCAP 205* 186* 169* 215*     Microbiology: Results for orders placed or performed during the hospital encounter of 02/09/22  Urine Culture     Status: Abnormal   Collection Time: 02/09/22  7:56 PM   Specimen: Urine, Random  Result Value Ref Range Status   Specimen Description   Final    URINE, RANDOM Performed at [facility], [address] [place], [place] [address]    Special Requests   Final    NONE Performed at [facility], [address] [place], [place] [address]    Culture >=100,000 COLONIES/mL CITROBACTER KOSERI (A)  Final   Report Status 02/12/2022 FINAL  Final   Organism ID, Bacteria CITROBACTER KOSERI (A)  Final      Susceptibility   Citrobacter koseri - MIC*    CEFAZOLIN >=64 RESISTANT Resistant     CEFEPIME <=0.12 SENSITIVE Sensitive     CEFTRIAXONE 1 SENSITIVE Sensitive     CIPROFLOXACIN <=0.25 SENSITIVE Sensitive     GENTAMICIN <=1 SENSITIVE Sensitive     IMIPENEM <=0.25 SENSITIVE Sensitive     NITROFURANTOIN 32 SENSITIVE Sensitive     TRIMETH/SULFA <=20 SENSITIVE Sensitive  PIP/TAZO <=4 SENSITIVE Sensitive     * >=100,000 COLONIES/mL CITROBACTER KOSERI    Coagulation Studies: No results for input(s): "LABPROT", "INR" in the last 72 hours.  Urinalysis: No results for input(s): "COLORURINE", "LABSPEC", "PHURINE", "GLUCOSEU", "HGBUR", "BILIRUBINUR", "KETONESUR", "PROTEINUR", "UROBILINOGEN", "NITRITE", "LEUKOCYTESUR" in the last 72 hours.  Invalid input(s): "APPERANCEUR"    Imaging: DG Chest 2 View  Result Date: 01/24/2023 CLINICAL DATA:  Shortness of breath EXAM: CHEST - 2 VIEW COMPARISON:  11/14/2021 FINDINGS: Heart size within normal limits. Prior sternotomy and CABG. Aortic  atherosclerosis. Persistently coarsened interstitial markings bilaterally, similar to prior. No lobar consolidation. No pleural effusion or pneumothorax. IMPRESSION: Persistently coarsened interstitial markings bilaterally, similar to prior. Appearance favors underlying interstitial lung disease over pulmonary edema. No lobar consolidation. Electronically Signed   By: Davina Poke D.O.   On: 01/24/2023 11:45     Medications:       Assessment/ Plan:  [name] is a 77 y.o. 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 Admitted to [facility] on 01/24/2023 for Shortness of breath [R06.02] Interstitial lung disease ([place]) [J84.9] ESRD (end stage renal disease) ([place]) [N18.6] Acute exacerbation of CHF (congestive heart failure) ([place]) [I50.9]   [facility] CCPD 9 hours 4 exchanges 2.5 liter fills tidal 80% 69kg 6 days a week.  End Stage Renal Disease on peritoneal dialysis. Resume home orders. Treatment received overnight, UF 557m. Will continue home regimen. Patient cleared to discharge from renal stance on current peritoneal dialysis prescription.   Hypertension: home regimen of carvedilol, amlodipine, hydralazine, isosorbide mononitrate, losartan, and torsemide. Blood pressure 131/66  Hyponatremia: patient appears mostly euvolemic on examination. This could be due to pulmonary process. Stable today. Monitoring.   Anemia with chronic kidney disease: macrocytic. Receiving IV iron and Micera as outpatient. Will continue to monitor for now.   Secondary Hyperparathyroidism: not currently on binders. Calcium remains acceptable but declining slowly.  Diabetes mellitus type II with chronic kidney disease: insulin dependent. Hemoglobin A1c of 7.3% on 12/28/22. Complicated with diabetic  retinopathy. Continue glucose control.    LOS: 1[place]2/8/202412:09 PM

## 2023-01-26 LAB — HEPATITIS B E ANTIBODY: Hep B E Ab: NEGATIVE

## 2023-01-26 LAB — GLOMERULAR BASEMENT MEMBRANE ANTIBODIES: GBM Ab: <0.2 units (ref 0.0–0.9)

## 2023-01-31 LAB — HEPATITIS B SURFACE ANTIBODY, QUANTITATIVE: Hep B S AB Quant (Post): 276.8 m[IU]/mL (ref >9.9)

## 2023-02-01 NOTE — Discharge Instructions (Signed)
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     Dept. of Orthopaedics & Sports Medicine  [facility]  [address]  [place], [place]  [address]  Phone: [phone]   Fax: [phone]    DIET: . Drink plenty of non-alcoholic fluids. . Resume your normal diet. Include foods high in fiber.  ACTIVITY:  . You may use crutches or a walker with weight-bearing as tolerated, unless instructed otherwise. . You may be weaned off of the walker or crutches by your Physical Therapist.  . Do NOT reach below the level of your knees or cross your legs until allowed.    . Continue doing gentle exercises. Exercising will reduce the pain and swelling, increase motion, and prevent muscle weakness.   . Please continue to use the TED compression stockings for 6 weeks. You may remove the stockings at night, but should reapply them in the morning. . Do not drive or operate any equipment until instructed.  WOUND CARE:  . Continue to use ice packs periodically to reduce pain and swelling. . Keep the incision clean and dry. . You may bathe or shower after the staples are removed at the first office visit following surgery.  MEDICATIONS: . You may resume your regular medications. . Please take the pain medication as prescribed on the medication. . Do not take pain medication on an empty stomach. . You have been given a prescription for a blood thinner to prevent blood clots. Please take the medication as instructed. (NOTE: After completing a 2 week course of Lovenox, take one Enteric-coated aspirin once a day.) . Pain medications and iron supplements can cause constipation. Use a stool softener (Senokot or Colace) on a daily basis and a laxative (dulcolax or miralax) as needed. . Do not drive or drink alcoholic beverages when taking pain medications.  CALL THE OFFICE FOR: . Temperature above 101 degrees . Excessive bleeding or drainage on the dressing. . Excessive  swelling, coldness, or paleness of the toes. . Persistent nausea and vomiting.  FOLLOW-UP:  . You should have an appointment to return to the office in 6 weeks after surgery. . Arrangements have been made for continuation of Physical Therapy (either home therapy or outpatient therapy).     [facility] Department Directory         [email or web address]       [email or web address]          Cardiology  Appointments: [place] - [phone] [place] - [phone]  Endocrinology  Appointments: [place] - [phone] [place] - [phone]  Gastroenterology  Appointments: [place] - [phone] [place] - [phone]        General Surgery   Appointments: [place] - [phone]  Internal Medicine/Family Medicine  Appointments: [place] - [phone] [place] - [phone] [place] - [phone]  Metabolic and Weigh Loss Surgery  Appointments: [place] - [phone]        Neurology  Appointments: [place] - [phone] [place] - [phone]  Neurosurgery  Appointments: [place] - [phone]  Obstetrics & Gynecology  Appointments: [place] - [phone] [place] - [phone]        Pediatrics  Appointments: [place] - [phone] [place] - [phone]  Physiatry  Appointments: [place] -[phone]  Physical Therapy  Appointments: [place] - [phone] [place] - [phone]        Podiatry  Appointments: [place] - [phone] [place] - [phone]  Pulmonology  Appointments: [place] - [phone]  Rheumatology  Appointments: [place] - [phone]        [place] Location: [facility]  [address] [place], [place]  [address]  [place] Location: [facility] [address] [place], [place]  [address]  [place] Location: [facility] [address] [place], [place]  [address]    

## 2023-02-05 ENCOUNTER — Inpatient Hospital Stay: Admission: RE | Admit: 2023-02-05 | Payer: Medicare PPO | Source: Ambulatory Visit

## 2023-02-14 DIAGNOSIS — M1712 Unilateral primary osteoarthritis, left knee: Secondary | ICD-10-CM

## 2023-02-14 DIAGNOSIS — E119 Type 2 diabetes mellitus without complications: Secondary | ICD-10-CM

## 2023-02-14 DIAGNOSIS — N1832 Chronic kidney disease, stage 3b: Secondary | ICD-10-CM

## 2023-02-14 DIAGNOSIS — E538 Deficiency of other specified B group vitamins: Secondary | ICD-10-CM

## 2023-02-14 DIAGNOSIS — N184 Chronic kidney disease, stage 4 (severe): Secondary | ICD-10-CM

## 2023-02-14 DIAGNOSIS — D638 Anemia in other chronic diseases classified elsewhere: Secondary | ICD-10-CM

## 2023-02-14 DIAGNOSIS — M1612 Unilateral primary osteoarthritis, left hip: Secondary | ICD-10-CM

## 2023-02-14 DIAGNOSIS — D631 Anemia in chronic kidney disease: Secondary | ICD-10-CM

## 2023-02-14 DIAGNOSIS — E559 Vitamin D deficiency, unspecified: Secondary | ICD-10-CM

## 2023-02-14 DIAGNOSIS — N186 End stage renal disease: Secondary | ICD-10-CM

## 2023-03-20 ENCOUNTER — Inpatient Hospital Stay: Admission: RE | Admit: 2023-03-20 | Payer: Medicare PPO | Source: Ambulatory Visit

## 2023-03-20 ENCOUNTER — Encounter
Admission: RE | Admit: 2023-03-20 | Discharge: 2023-03-20 | Disposition: A | Discharge status: Home / Self Care | Payer: Medicare PPO | Source: Ambulatory Visit | Attending: Orthopedic Surgery | Admitting: Orthopedic Surgery

## 2023-03-20 DIAGNOSIS — Z01812 Encounter for preprocedural laboratory examination: Secondary | ICD-10-CM | POA: Insufficient documentation

## 2023-03-20 DIAGNOSIS — N184 Chronic kidney disease, stage 4 (severe): Secondary | ICD-10-CM | POA: Insufficient documentation

## 2023-03-20 DIAGNOSIS — M1612 Unilateral primary osteoarthritis, left hip: Secondary | ICD-10-CM | POA: Insufficient documentation

## 2023-03-20 DIAGNOSIS — N186 End stage renal disease: Secondary | ICD-10-CM

## 2023-03-20 DIAGNOSIS — D631 Anemia in chronic kidney disease: Secondary | ICD-10-CM | POA: Insufficient documentation

## 2023-03-20 DIAGNOSIS — E119 Type 2 diabetes mellitus without complications: Secondary | ICD-10-CM

## 2023-03-20 DIAGNOSIS — E559 Vitamin D deficiency, unspecified: Secondary | ICD-10-CM | POA: Diagnosis not present

## 2023-03-20 DIAGNOSIS — E1122 Type 2 diabetes mellitus with diabetic chronic kidney disease: Secondary | ICD-10-CM | POA: Diagnosis not present

## 2023-03-20 DIAGNOSIS — D638 Anemia in other chronic diseases classified elsewhere: Secondary | ICD-10-CM | POA: Insufficient documentation

## 2023-03-20 DIAGNOSIS — E538 Deficiency of other specified B group vitamins: Secondary | ICD-10-CM

## 2023-03-20 HISTORY — DX: Type 2 diabetes mellitus with unspecified diabetic retinopathy without macular edema: E11.319

## 2023-03-20 HISTORY — DX: Age-related osteoporosis without current pathological fracture: M81.0

## 2023-03-20 HISTORY — DX: Benign neoplasm of colon, unspecified: D12.6

## 2023-03-20 HISTORY — DX: Vitamin D deficiency, unspecified: E55.9

## 2023-03-20 HISTORY — DX: Other intervertebral disc degeneration, lumbar region: M51.36

## 2023-03-20 HISTORY — DX: Type 2 diabetes mellitus with diabetic chronic kidney disease: E11.22

## 2023-03-20 HISTORY — DX: Interstitial pulmonary disease, unspecified: J84.9

## 2023-03-20 LAB — URINALYSIS, ROUTINE W REFLEX MICROSCOPIC
Bilirubin Urine: NEGATIVE
Glucose, UA: NEGATIVE mg/dL
Hgb urine dipstick: NEGATIVE
Ketones, ur: NEGATIVE mg/dL
Nitrite: NEGATIVE
Protein, ur: 100 mg/dL — AB
Specific Gravity, Urine: 1.01 (ref 1.005–1.030)
pH: 6 (ref 5.0–8.0)

## 2023-03-20 LAB — CBC WITH DIFFERENTIAL/PLATELET
Abs Immature Granulocytes: 0.12 10*3/uL — ABNORMAL HIGH (ref 0.00–0.07)
Basophils Absolute: 0 10*3/uL (ref 0.0–0.1)
Basophils Relative: 0 %
Eosinophils Absolute: 0.1 10*3/uL (ref 0.0–0.5)
Eosinophils Relative: 2 %
HCT: 35.2 % — ABNORMAL LOW (ref 36.0–46.0)
Hemoglobin: 11.8 g/dL — ABNORMAL LOW (ref 12.0–15.0)
Immature Granulocytes: 1 %
Lymphocytes Relative: 19 %
Lymphs Abs: 1.7 10*3/uL (ref 0.7–4.0)
MCH: 34.6 pg — ABNORMAL HIGH (ref 26.0–34.0)
MCHC: 33.5 g/dL (ref 30.0–36.0)
MCV: 103.2 fL — ABNORMAL HIGH (ref 80.0–100.0)
Monocytes Absolute: 0.6 10*3/uL (ref 0.1–1.0)
Monocytes Relative: 6 %
Neutro Abs: 6.3 10*3/uL (ref 1.7–7.7)
Neutrophils Relative %: 72 %
Platelets: 276 10*3/uL (ref 150–400)
RBC: 3.41 MIL/uL — ABNORMAL LOW (ref 3.87–5.11)
RDW: 12.6 % (ref 11.5–15.5)
WBC: 8.9 10*3/uL (ref 4.0–10.5)
nRBC: 0 % (ref 0.0–0.2)

## 2023-03-20 LAB — COMPREHENSIVE METABOLIC PANEL
ALT: 18 U/L (ref 0–44)
AST: 20 U/L (ref 15–41)
Albumin: 3 g/dL — ABNORMAL LOW (ref 3.5–5.0)
Alkaline Phosphatase: 95 U/L (ref 38–126)
Anion gap: 11 (ref 5–15)
BUN: 52 mg/dL — ABNORMAL HIGH (ref 8–23)
CO2: 28 mmol/L (ref 22–32)
Calcium: 8.9 mg/dL (ref 8.9–10.3)
Chloride: 97 mmol/L — ABNORMAL LOW (ref 98–111)
Creatinine, Ser: 2.86 mg/dL — ABNORMAL HIGH (ref 0.44–1.00)
GFR, Estimated: 17 mL/min — ABNORMAL LOW (ref >60)
Glucose, Bld: 71 mg/dL (ref 70–99)
Potassium: 4 mmol/L (ref 3.5–5.1)
Sodium: 136 mmol/L (ref 135–145)
Total Bilirubin: 0.7 mg/dL (ref 0.3–1.2)
Total Protein: 6.7 g/dL (ref 6.5–8.1)

## 2023-03-20 LAB — TYPE AND SCREEN
ABO/RH(D): O POS
Antibody Screen: NEGATIVE

## 2023-03-20 LAB — SURGICAL PCR SCREEN
MRSA, PCR: NEGATIVE
Staphylococcus aureus: NEGATIVE

## 2023-03-20 LAB — SEDIMENTATION RATE: Sed Rate: 67 mm/hr — ABNORMAL HIGH (ref 0–30)

## 2023-03-20 LAB — C-REACTIVE PROTEIN: CRP: 1 mg/dL — ABNORMAL HIGH (ref <1.0)

## 2023-03-20 NOTE — Pre-Procedure Instructions (Signed)
Copy and pasted from care everywhere regarding cardiac clearance for hip surgery:  ----- Message from Janice Coffin, [place] sent at 11/27/2022 8:34 AM EST ----- Regarding: RE: Preoperative cardiac optimization Good morning Dr. Marry Guan,  I recently saw her in November for pre-operative clearance. She recently had a normal stress test and echocardiogram in October and symptomatically is stable on her current medications. She's overall low risk for left total hip arthroplasty and can proceed without restrictions.  Thank you, Leroy Libman, PA-C

## 2023-03-20 NOTE — Patient Instructions (Addendum)
Your procedure is scheduled on: Wednesday, April 10 Report to the Registration Desk on the 1st floor of the [facility]. To find out your arrival time, please call [phone] between 1PM - 3PM on: Tuesday, April 9 If your arrival time is 6:00 am, do not arrive before that time as the [place] entrance doors do not open until 6:00 am.  REMEMBER: Instructions that are not followed completely may result in serious medical risk, up to and including death; or upon the discretion of your surgeon and anesthesiologist your surgery may need to be rescheduled.  Do not eat food after midnight the night before surgery.  No gum chewing or hard candies.  You may however, drink water up to 2 hours before you are scheduled to arrive for your surgery. Do not drink anything within 2 hours of your scheduled arrival time.  In addition, your doctor has ordered for you to drink the provided:  Gatorade G2 Drinking this carbohydrate drink up to two hours before surgery helps to reduce insulin resistance and improve patient outcomes. Please complete drinking 2 hours before scheduled arrival time.  One week prior to surgery: starting April 3 Stop Anti-inflammatories (NSAIDS) such as Advil, Aleve, Ibuprofen, Motrin, Naproxen, Naprosyn and Aspirin based products such as Excedrin, Goody's Powder, BC Powder. Stop ANY OVER THE COUNTER supplements until after surgery. You may however, continue to take Tylenol if needed for pain up until the day of surgery.  Continue taking all prescribed medications  TAKE ONLY THESE MEDICATIONS THE MORNING OF SURGERY WITH A SIP OF WATER:  Albuterol inhaler Carvedilol Citalopram (Celexa) Hydroxychloroquine Toujeo (only take 12 units) Levothyroxine Omeprazole - (take one the night before and one on the morning of surgery - helps to prevent nausea after surgery.) Renolazine (Ranexa) Rosuvastatin (Crestor)  Use inhalers on the day of surgery and bring to the hospital.  No  Alcohol for 24 hours before or after surgery.  No Smoking including e-cigarettes for 24 hours before surgery.  No chewable tobacco products for at least 6 hours before surgery.  No nicotine patches on the day of surgery.  Do not use any "recreational" drugs for at least a week (preferably 2 weeks) before your surgery.  Please be advised that the combination of cocaine and anesthesia may have negative outcomes, up to and including death. If you test positive for cocaine, your surgery will be cancelled.  On the morning of surgery brush your teeth with toothpaste and water, you may rinse your mouth with mouthwash if you wish. Do not swallow any toothpaste or mouthwash.  Use CHG Soap as directed on instruction sheet.  Do not wear jewelry, make-up, hairpins, clips or nail polish.  Do not wear lotions, powders, or perfumes.   Do not shave body hair from the neck down 48 hours before surgery.  Contact lenses, hearing aids and dentures may not be worn into surgery.  Do not bring valuables to the hospital. [facility] is not responsible for any missing/lost belongings or valuables.   Notify your doctor if there is any change in your medical condition (cold, fever, infection).  Wear comfortable clothing (specific to your surgery type) to the hospital.  After surgery, you can help prevent lung complications by doing breathing exercises.  Take deep breaths and cough every 1-2 hours. Your doctor may order a device called an Incentive Spirometer to help you take deep breaths.  If you are being admitted to the hospital overnight, leave your suitcase in the car. After  surgery it may be brought to your room.  In case of increased patient census, it may be necessary for you, the patient, to continue your postoperative care in the Same Day Surgery department.  If you are being discharged the day of surgery, you will not be allowed to drive home. You will need a responsible individual to drive you  home and stay with you for 24 hours after surgery.   If you are taking public transportation, you will need to have a responsible individual with you.  Please call the [place] Dept. at [phone] if you have any questions about these instructions.  Surgery Visitation Policy:  Patients having surgery or a procedure may have two visitors.  Children under the age of 40 must have an adult with them who is not the patient.  Inpatient Visitation:    Visiting hours are 7 a.m. to 8 p.m. Up to four visitors are allowed at one time in a patient room. The visitors may rotate out with other people during the day.  One visitor age 29 or older may stay with the patient overnight and must be in the room by 8 p.m.  Preoperative Educational Videos for Total Hip, Knee and Shoulder Replacements  To better prepare for surgery, please view our videos that explain the physical activity and discharge planning required to have the best surgical recovery at [facility].  [email or web address]  Questions? Call [phone] or email [email or web address]

## 2023-03-21 ENCOUNTER — Encounter: Payer: Self-pay | Admitting: Orthopedic Surgery

## 2023-03-21 LAB — HEMOGLOBIN A1C
Hgb A1c MFr Bld: 7.6 % — ABNORMAL HIGH (ref 4.8–5.6)
Mean Plasma Glucose: 171 mg/dL

## 2023-03-21 NOTE — Progress Notes (Signed)
Perioperative / Anesthesia Services  Pre-Admission Testing Clinical Review / Preoperative Anesthesia Consult  Date: 03/21/23  Patient Demographics:  Name: Alexandria Moody DOB:   Jun 26, 1946 MRN:   [id number]  Planned Surgical Procedure(s):    Case: [id number] Date/Time: 03/28/23 0700   Procedure: TOTAL HIP ARTHROPLASTY - RNFA (Left: Hip)   Anesthesia type: Choice   Pre-op diagnosis: PRIMARY OSTEOARTHRITIS OF LEFT HIP   Location: [facility]   Surgeons: Dereck Leep, MD     NOTE: Available PAT nursing documentation and vital signs have been reviewed. Clinical nursing staff has updated patient's PMH/PSHx, current medication list, and drug allergies/intolerances to ensure comprehensive history available to assist in medical decision making as it pertains to the aforementioned surgical procedure and anticipated anesthetic course. Extensive review of available clinical information personally performed. [place] PMH and PSHx updated with any diagnoses/procedures that  may have been inadvertently omitted during her intake with the pre-admission testing department's nursing staff.  Clinical Discussion:  Alexandria Moody is a 77 y.o. female who is submitted for pre-surgical anesthesia review and clearance prior to her undergoing the above procedure. Patient is a Former Smoker (50 pack years; quit 12/1998). 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   Patient is followed by cardiology Nehemiah Massed, MD). She was last seen in the cardiology clinic on 10/24/2022.; notes reviewed. At the time of her clinic visit, patient doing well overall from a cardiovascular perspective. Patient denied any chest pain,  shortness of breath, PND, orthopnea, palpitations, significant peripheral edema, weakness, fatigue, vertiginous symptoms, or presyncope/syncope. Patient with a past medical history significant for cardiovascular diagnoses. Documented physical exam was grossly benign, providing no evidence of acute exacerbation and/or decompensation of the patient's known cardiovascular conditions.  Patient underwent a three-vessel CABG procedure in 2002.  LIMA-LAD, SVG-diagonal, SVG-OM bypass grafts were placed.    Myocardial perfusion imaging study performed on 02/21/2021 revealed a mildly reduced left ventricular systolic function with an EF of 48%.  There was a moderate mixed perfusion abnormality present in the posterior lateral myocardial region on stress images.  Resting images showed minimal perfusion abnormality of mild intensity in the posterior lateral myocardium consistent with previous myocardial infarction and/or scar as well as reversibility of ischemia.  Most recent TTE performed on 01/25/2023 revealed a normal left ventricular systolic function with an EF of 55-60%.  There were no segmental wall motion abnormalities noted.  Diastolic Doppler parameters consistent with impaired relaxation (G1DD).  There was mild mitral valve regurgitation. All transvalvular gradients were noted to be normal providing no evidence suggestive of valvular stenosis.  Blood pressure reasonably controlled at 140/62 mmHg on currently prescribed CCB (amlodipine), beta-blocker (carvedilol), alpha blocker (clonidine), nitrate (isosorbide mononitrate), and ARB (losartan) therapies.  Patient is on rosuvastatin for her HLD diagnosis and ASCVD prevention.  Patient is on scheduled ranolazine for angina/anginal equivalent symptom prevention..  Additionally, she has a supply of short acting nitrates (NTG) to use on a as needed basis; denied recent use. T2DM adequately controlled on currently prescribed regimen; last HgbA1c was 7.8% when checked  on 01/02/2023. She does not have an OSAH diagnosis. Patient makes an effort to maintain an active lifestyle.  She exercises regularly. Functional capacity, as defined by DASI, is documented as being >/=  4 METS.  No changes were made to her medication regimen during her visit with cardiology.  Patient scheduled to follow-up with outpatient cardiology in 9 months or sooner if needed.  Alexandria Moody is scheduled for an elective TOTAL LEFT HIP ARTHROPLASTY on 03/28/2023 with Dr. Skip Estimable, MD.  Given patient's past medical history significant for cardiovascular diagnoses, presurgical cardiac clearance was sought by the PAT team. Per cardiology, "this patient is optimized for surgery and may proceed with the planned procedural course with a LOW risk of significant perioperative cardiovascular complications".   In review of her medication reconciliation, it is noted that patient is currently on prescribed daily antithrombotic therapy. She has been instructed on recommendations for holding her daily low-dose ASA for 7 days prior to her procedure with plans to restart as soon as postoperative bleeding risk felt to be minimized by her attending surgeon. The patient has been instructed that her last dose of her ASA should be on 03/20/2023.  Patient denies previous perioperative complications with anesthesia in the past. In review of the available records, it is noted that patient underwent a general anesthetic course ACCEPTABLE (ASA III) in 01/2022 without documented complications.      03/20/2023    3:56 PM 01/25/2023    9:39 AM 01/25/2023    3:24 AM  Vitals with BMI  Height 5\' 3"     Weight 152 lbs    BMI 0000000    Systolic 0000000 [id number]  Diastolic 58 66 66  Pulse 52 84 64    Providers/Specialists:   NOTE: Primary physician provider listed below. Patient may have been seen by APP or partner within same practice.   PROVIDER ROLE / SPECIALTY LAST OV  Hooten, Laurice Record, MD Orthopedics (Surgeon) 11/23/2022   Bubba Camp, [place] Primary Care Provider 02/08/2023  Serafina Royals, MD Cardiology 10/24/2022  Anthonette Legato, MD Nephrology 01/25/2023  Ephriam Jenkins, MD Rheumatology 01/02/2023  Earlie Server, MD Hematology/Oncology 01/12/2022  Lavone Orn, MD Endocrinology 02/27/2023  Ottie Glazier, MD Pulmonary Medicine 02/19/2023   Allergies:  Tape  Current Home Medications:   No current facility-administered medications for this encounter.    acetaminophen (TYLENOL) 650 MG CR tablet   albuterol (VENTOLIN HFA) 108 (90 Base) MCG/ACT inhaler   amLODipine (NORVASC) 5 MG tablet   aspirin 81 MG EC tablet   calcitRIOL (ROCALTROL) 0.25 MCG capsule   carvedilol (COREG) 6.25 MG tablet   cholecalciferol 25 MCG (1000 UT) tablet   citalopram (CELEXA) 20 MG tablet   cloNIDine (CATAPRES) 0.1 MG tablet   fluocinonide (LIDEX) 0.05 % external solution   hydroxychloroquine (PLAQUENIL) 200 MG tablet   insulin aspart (NOVOLOG) 100 UNIT/ML injection   insulin glargine, 1 Unit Dial, (TOUJEO SOLOSTAR) 300 UNIT/ML Solostar Pen   isosorbide mononitrate (IMDUR) 120 MG 24 hr tablet   levothyroxine (SYNTHROID) 75 MCG tablet   losartan (COZAAR) 100 MG tablet   Melatonin 10 MG TABS   Multiple Minerals-Vitamins (CALCIUM CITRATE +) TABS   nitroGLYCERIN (NITROSTAT) 0.4 MG SL tablet   omega-3 acid ethyl esters (LOVAZA) 1 g capsule   omeprazole (PRILOSEC) 20 MG capsule   ranolazine (RANEXA) 500 MG 12 hr tablet   rosuvastatin (CRESTOR) 20 MG tablet   torsemide (DEMADEX) 20 MG tablet   History:   Past Medical History:  Diagnosis Date   Adenomatous polyp of colon    Anemia of chronic renal failure    a.) recieving iron infusions + epoetin alfa-epbx (Retacrit)   Anginal pain    Aortic  atherosclerosis    Arthritis    Atherosclerosis of native arteries of extremity with intermittent claudication    a.) ABI/TBI 06/24/2021: 0.95 RIGHT, 0.72 LEFT   Bilateral carotid artery disease 07/25/2021   a.) carotid  doppler 07/25/2021: < A999333 RICA, < A999333 LICA.   Carotid atherosclerosis, bilateral    CHF (congestive heart failure)    a.) TTE 05/12/2020: LVEF 55-60%; mild LA enlargement; trivial to mild MR; G1DD; b.) TTE 01/25/2023: EF 55-60%, mild MR, G1DD.   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   a.) smolding phase ANCA associated pauci immune   Heart murmur    HLD (hyperlipidemia)    Hypertension    Hypothyroidism    Interstitial lung disease    Long term current use of immunosuppressive drug    a.) hydroxychloriquine for ANCA (+) glomerulonephritis   Lumbar degenerative disc disease    Macular degeneration    Osteoporosis    PAD (peripheral artery disease)    Peritoneal dialysis catheter in place 2023   Pneumonia 11/2021   S/P CABG x 3 2002   a.) LIMA-LAD, SVG-diagnoal, SVG-OM   Skin cancer    TIA (transient ischemic attack)    Type 2 diabetes mellitus with chronic kidney disease on chronic dialysis, with long-term current use of insulin    Vitamin D deficiency    Past Surgical History:  Procedure Laterality Date   ABDOMINAL HYSTERECTOMY     age 24   CAPD INSERTION N/A 02/03/2022   Procedure: LAPAROSCOPIC INSERTION CONTINUOUS AMBULATORY PERITONEAL DIALYSIS  (CAPD) CATHETER;  Surgeon: Jules Husbands, MD;  Location: [facility] ORS;  Service: General;  Laterality: N/A;  Provider requesting 1.5 hours / 90 minutes for procedure.   CATARACT EXTRACTION Bilateral 2014   CHOLECYSTECTOMY  2020   COLONOSCOPY     CORONARY ARTERY BYPASS GRAFT N/A 2002   Procedure: 3v CORONARY ARTERY ARTERY BYPASS GRAFT   EYE SURGERY Left 07/22/2020   Dr. Towanda Octave, [facility]   TONSILLECTOMY     No family  history on file. Social History   Tobacco Use   Smoking status: Former    Packs/day: 2.00    Years: 25.00    Additional pack years: 0.00    Total pack years: 50.00    Types: Cigarettes    Quit date: 2000    Years since quitting: 24.2    Passive exposure: Past   Smokeless tobacco: Never  Vaping Use   Vaping Use: Never used  Substance Use Topics   Alcohol use: Never   Drug use: Never    Pertinent Clinical Results:  LABS:   Hospital Outpatient Visit on 03/20/2023  Component Date Value Ref Range Status   CRP 03/20/2023 1.0 (H)  <1.0 mg/dL Final   Performed at [facility], [address] [place], [place] [address]   Sed Rate 03/20/2023 67 (H)  0 - 30 mm/hr Final   Performed at [facility], [address] [place], [place] [address]   MRSA, PCR 03/20/2023 NEGATIVE  NEGATIVE Final   Staphylococcus aureus 03/20/2023 NEGATIVE  NEGATIVE Final   Comment: (NOTE) The Xpert SA Assay (FDA approved for NASAL specimens in patients 12 years of age  and older), is one component of a comprehensive surveillance program. It is not intended to diagnose infection nor to guide or monitor treatment. Performed at [facility], [address] [place], [place] [address]    WBC 03/20/2023 8.9  4.0 - 10.5 K/uL Final   RBC 03/20/2023 3.41 (L)  3.87 - 5.11 MIL/uL Final   Hemoglobin 03/20/2023 11.8 (L)  12.0 - 15.0 g/dL Final   HCT 03/20/2023 35.2 (L)  36.0 - 46.0 % Final   MCV 03/20/2023 103.2 (H)  80.0 - 100.0 fL Final   MCH 03/20/2023 34.6 (H)  26.0 - 34.0 pg Final   MCHC 03/20/2023 33.5  30.0 - 36.0 g/dL Final   RDW 03/20/2023 12.6  11.5 - 15.5 % Final   Platelets 03/20/2023 276  150 - 400 K/uL Final   nRBC 03/20/2023 0.0  0.0 - 0.2 % Final   Neutrophils Relative % 03/20/2023 72  % Final   Neutro Abs 03/20/2023 6.3  1.7 - 7.7 K/uL Final   Lymphocytes Relative 03/20/2023 19  % Final   Lymphs Abs 03/20/2023 1.7  0.7 - 4.0 K/uL Final   Monocytes Relative 03/20/2023 6   % Final   Monocytes Absolute 03/20/2023 0.6  0.1 - 1.0 K/uL Final   Eosinophils Relative 03/20/2023 2  % Final   Eosinophils Absolute 03/20/2023 0.1  0.0 - 0.5 K/uL Final   Basophils Relative 03/20/2023 0  % Final   Basophils Absolute 03/20/2023 0.0  0.0 - 0.1 K/uL Final   Immature Granulocytes 03/20/2023 1  % Final   Abs Immature Granulocytes 03/20/2023 0.12 (H)  0.00 - 0.07 K/uL Final   Performed at [facility], [place], [place] [address]   Sodium 03/20/2023 136  135 - 145 mmol/L Final   Potassium 03/20/2023 4.0  3.5 - 5.1 mmol/L Final   Chloride 03/20/2023 97 (L)  98 - 111 mmol/L Final   CO2 03/20/2023 28  22 - 32 mmol/L Final   Glucose, Bld 03/20/2023 71  70 - 99 mg/dL Final   Glucose reference range applies only to samples taken after fasting for at least 8 hours.   BUN 03/20/2023 52 (H)  8 - 23 mg/dL Final   Creatinine, Ser 03/20/2023 2.86 (H)  0.44 - 1.00 mg/dL Final   Calcium 03/20/2023 8.9  8.9 - 10.3 mg/dL Final   Total Protein 03/20/2023 6.7  6.5 - 8.1 g/dL Final   Albumin 03/20/2023 3.0 (L)  3.5 - 5.0 g/dL Final   AST 03/20/2023 20  15 - 41 U/L Final   ALT 03/20/2023 18  0 - 44 U/L Final   Alkaline Phosphatase 03/20/2023 95  38 - 126 U/L Final   Total Bilirubin 03/20/2023 0.7  0.3 - 1.2 mg/dL Final   GFR, Estimated 03/20/2023 17 (L)  >60 mL/min Final   Comment: (NOTE) Calculated using the CKD-EPI Creatinine Equation (2021)    Anion gap 03/20/2023 11  5 - 15 Final   Performed at [facility], [place],  [address]   Color, Urine 03/20/2023 YELLOW (A)  YELLOW Final   APPearance 03/20/2023 HAZY (A)  CLEAR Final   Specific Gravity, Urine 03/20/2023 1.010  1.005 - 1.030 Final   pH 03/20/2023 6.0  5.0 - 8.0 Final   Glucose, UA 03/20/2023 NEGATIVE  NEGATIVE mg/dL Final   Hgb urine dipstick 03/20/2023 NEGATIVE  NEGATIVE Final   Bilirubin Urine 03/20/2023 NEGATIVE  NEGATIVE Final   Ketones, ur 03/20/2023 NEGATIVE  NEGATIVE mg/dL Final   Protein, ur 03/20/2023 100 (A)  NEGATIVE mg/dL Final   Nitrite 03/20/2023 NEGATIVE  NEGATIVE Final   Leukocytes,Ua 03/20/2023 TRACE (A)  NEGATIVE Final   RBC / HPF 03/20/2023 0-5  0 - 5 RBC/hpf Final   WBC, UA 03/20/2023 11-20  0 - 5 WBC/hpf Final   Bacteria, UA 03/20/2023 RARE (A)  NONE SEEN Final   Squamous Epithelial / HPF 03/20/2023 11-20  0 - 5 /HPF Final   Mucus 03/20/2023 PRESENT   Final   Hyaline Casts, UA 03/20/2023 PRESENT   Final   Performed at [facility], [address] [place], [place] [address]   ABO/RH(D) 03/20/2023 O POS   Final   Antibody Screen 03/20/2023 NEG   Final   Sample Expiration 03/20/2023 04/03/2023,2359   Final   Extend sample reason 03/20/2023    Final                   Value:NO TRANSFUSIONS OR PREGNANCY IN THE PAST 3 MONTHS Performed at [facility], [address] [place], [place] [address]     ECG: Date: 01/24/2023 Time ECG obtained: 1105 AM Rate: 63 bpm Rhythm: normal sinus Axis (leads I and aVF): Normal Intervals: PR 184 ms. QRS 110 ms. QTc 491 ms. ST segment and T wave changes: No evidence of acute ST segment elevation or depression Comparison: Similar to previous tracing obtained on 11/14/2021. Evidence of an age undetermined septal infarct present.   IMAGING / PROCEDURES: DIAGNOSTIC RADIOGRAPHS OF CHEST 2 VIEWS performed on 01/24/2023 Heart size within normal limits.  Prior sternotomy and CABG.  Aortic atherosclerosis.  Persistently coarsened interstitial markings bilaterally, similar to prior.  No lobar consolidation.  No pleural effusion or pneumothorax.  TRANSTHORACIC ECHOCARDIOGRAM performed on 01/25/2023 Left ventricular ejection fraction, by estimation, is 55 to 60%. The left ventricle has normal function. The left ventricle has no regional wall motion abnormalities. Left ventricular diastolic parameters are consistent with Grade I diastolic  dysfunction (impaired relaxation).  Right  ventricular systolic function is normal. The right ventricular size is normal.  The mitral valve is normal in structure. Mild mitral valve regurgitation.  The aortic valve is normal in structure. Aortic valve regurgitation is not visualized.   DIAGNOSTIC RADIOGRAPHS OF BILATERAL HIPS 2 VIEWS WITHOUT OR WITHOUT PELVIS performed on 11/23/2022 Radiographs of the RIGHT hip demonstrate moderate narrowing of the cartilage space especially superiorly.  Subchondral sclerosis is noted.  Radiographs of the LEFT hip demonstrate significant narrowing of the cartilage space with bone-on-bone articulation.  There is some mild flattening of the superior aspect of the femoral head.  Subchondral sclerosis is noted. Osteophyte formation is present.  Also of note is significant vascular calcification extending down both thighs.   MYOCARDIAL PERFUSION IMAGING STUDY (LEXISCAN) performed on 10/12/2022 LVEF 47%  Normal myocardial thickening and wall motion No artifacts noted Left ventricular cavity size normal SPECT images demonstrate moderate mixed perfusion abnormality of moderate intensity present posterior lateral myocardial region on stress images.  Resting images showed minimal perfusion abnormality of mild intensity in the posterior lateral myocardium consistent with possible previous infarct and/or scar as well as reversibility and ischemia.  BILATERAL CAROTID DOPPLER performed on 08/31/2022 RIGHT CAROTID ARTERY: Mild calcified plaque at the level of the proximal right ICA. Estimated right ICA stenosis is less than 50%. RIGHT VERTEBRAL ARTERY: Antegrade flow with normal waveform and velocity. LEFT CAROTID ARTERY: Moderate calcified plaque at the level of the distal common carotid artery and mild  calcified plaque at the level of the carotid bulb and proximal left ICA. Estimated left ICA stenosis is less than 50%. LEFT VERTEBRAL ARTERY: Antegrade flow with normal waveform and velocity.   VASCULAR ULTRASOUND ABI  WITH/WITHOUT TBI performed on 06/24/2021 Right: Resting right ankle-brachial index is within normal range. Evidence of mild tibial level arterial disease of right lower extremity. Mildly abnormal left TBI.  Left: Resting left ankle-brachial index indicates moderate left lower extremity arterial disease. The left toe-brachial index is abnormal.     Impression and Plan:  Atiana Malick has been referred for pre-anesthesia review and clearance prior to her undergoing the planned anesthetic and procedural courses. Available labs, pertinent testing, and imaging results were personally reviewed by me in preparation for upcoming operative/procedural course. [facility] medical record has been updated following extensive record review and patient interview with PAT staff.   This patient has been appropriately cleared by cardiology with an overall LOW risk of significant perioperative cardiovascular complications. Based on clinical review performed today (03/21/23), barring any significant acute changes in the patient's overall condition, it is anticipated that she will be able to proceed with the planned surgical intervention. Any acute changes in clinical condition may necessitate her procedure being postponed and/or cancelled. Patient will meet with anesthesia team (MD and/or CRNA) on the day of her procedure for preoperative evaluation/assessment. Questions regarding anesthetic course will be fielded at that time.   Pre-surgical instructions were reviewed with the patient during her PAT appointment, and questions were fielded to satisfaction by PAT clinical staff. She has been instructed on which medications that she will need to hold prior to surgery, as well as the ones that have been deemed safe/appropriate to take of the day of her procedure. As part of the general education provided by PAT, patient made aware both verbally and in writing, that she would need to abstain from the use of any illegal substances  during her perioperative course.  She was advised that failure to follow the provided instructions could necessitate case cancellation or result serious perioperative complications up to and including death. Patient encouraged to contact PAT and/or her surgeon's office to discuss any questions or concerns that may arise prior to surgery; verbalized understanding.   Honor Loh, MSN, APRN, FNP-C, CEN [facility]  Peri-operative Services Nurse Practitioner Phone: [phone] Fax: [phone] 03/21/23 2:54 PM  NOTE: This note has been prepared using Dragon dictation software. Despite my best ability to proofread, there is always the potential that unintentional transcriptional errors may still occur from this process.

## 2023-03-27 MED ORDER — CHLORHEXIDINE GLUCONATE 0.12 % MT SOLN
15.0000 mL | Freq: Once | OROMUCOSAL | Status: AC
  Administered 2023-03-28: 15 mL via OROMUCOSAL

## 2023-03-27 MED ORDER — CHLORHEXIDINE GLUCONATE 4 % EX LIQD: 60.0000 mL | Freq: Once | CUTANEOUS | Status: DC

## 2023-03-27 MED ORDER — CEFAZOLIN SODIUM-DEXTROSE 2-4 GM/100ML-% IV SOLN
2.0000 g | INTRAVENOUS | Status: AC
  Administered 2023-03-28: 2 g via INTRAVENOUS

## 2023-03-27 MED ORDER — ORAL CARE MOUTH RINSE: 15.0000 mL | Freq: Once | OROMUCOSAL | Status: AC

## 2023-03-27 MED ORDER — SODIUM CHLORIDE 0.9 % IV SOLN: INTRAVENOUS | Status: DC

## 2023-03-27 MED ORDER — TRANEXAMIC ACID-NACL 1000-0.7 MG/100ML-% IV SOLN
1000.0000 mg | INTRAVENOUS | Status: AC
  Administered 2023-03-28: 1000 mg via INTRAVENOUS

## 2023-03-27 MED ORDER — DEXAMETHASONE SODIUM PHOSPHATE 10 MG/ML IJ SOLN
8.0000 mg | Freq: Once | INTRAMUSCULAR | Status: AC
  Administered 2023-03-28: 8 mg via INTRAVENOUS

## 2023-03-27 MED ORDER — GABAPENTIN 300 MG PO CAPS: 300.0000 mg | ORAL_CAPSULE | Freq: Once | ORAL | Status: DC

## 2023-03-28 ENCOUNTER — Other Ambulatory Visit: Payer: Self-pay

## 2023-03-28 ENCOUNTER — Observation Stay: Payer: Medicare PPO

## 2023-03-28 ENCOUNTER — Encounter: Payer: Self-pay | Admitting: Orthopedic Surgery

## 2023-03-28 ENCOUNTER — Encounter
Admission: RE | Disposition: A | Discharge status: Home Health | Payer: Self-pay | Source: Home / Self Care | Attending: Orthopedic Surgery

## 2023-03-28 ENCOUNTER — Ambulatory Visit: Payer: Medicare PPO | Admitting: Urgent Care

## 2023-03-28 ENCOUNTER — Observation Stay
Admission: RE | Admit: 2023-03-28 | Discharge: 2023-03-29 | Disposition: A | Discharge status: Home Health | Payer: Medicare PPO | Attending: Orthopedic Surgery | Admitting: Orthopedic Surgery

## 2023-03-28 DIAGNOSIS — Z8673 Personal history of transient ischemic attack (TIA), and cerebral infarction without residual deficits: Secondary | ICD-10-CM | POA: Insufficient documentation

## 2023-03-28 DIAGNOSIS — Z7982 Long term (current) use of aspirin: Secondary | ICD-10-CM | POA: Diagnosis not present

## 2023-03-28 DIAGNOSIS — Z951 Presence of aortocoronary bypass graft: Secondary | ICD-10-CM | POA: Insufficient documentation

## 2023-03-28 DIAGNOSIS — E119 Type 2 diabetes mellitus without complications: Secondary | ICD-10-CM

## 2023-03-28 DIAGNOSIS — M1612 Unilateral primary osteoarthritis, left hip: Principal | ICD-10-CM

## 2023-03-28 DIAGNOSIS — N1832 Chronic kidney disease, stage 3b: Secondary | ICD-10-CM

## 2023-03-28 DIAGNOSIS — N186 End stage renal disease: Secondary | ICD-10-CM

## 2023-03-28 DIAGNOSIS — I132 Hypertensive heart and chronic kidney disease with heart failure and with stage 5 chronic kidney disease, or end stage renal disease: Secondary | ICD-10-CM | POA: Insufficient documentation

## 2023-03-28 DIAGNOSIS — M1712 Unilateral primary osteoarthritis, left knee: Secondary | ICD-10-CM

## 2023-03-28 DIAGNOSIS — E538 Deficiency of other specified B group vitamins: Secondary | ICD-10-CM

## 2023-03-28 DIAGNOSIS — Z992 Dependence on renal dialysis: Secondary | ICD-10-CM | POA: Insufficient documentation

## 2023-03-28 DIAGNOSIS — I2510 Atherosclerotic heart disease of native coronary artery without angina pectoris: Secondary | ICD-10-CM | POA: Insufficient documentation

## 2023-03-28 DIAGNOSIS — D631 Anemia in chronic kidney disease: Secondary | ICD-10-CM

## 2023-03-28 DIAGNOSIS — I5032 Chronic diastolic (congestive) heart failure: Secondary | ICD-10-CM | POA: Diagnosis not present

## 2023-03-28 DIAGNOSIS — Z79899 Other long term (current) drug therapy: Secondary | ICD-10-CM | POA: Insufficient documentation

## 2023-03-28 DIAGNOSIS — E039 Hypothyroidism, unspecified: Secondary | ICD-10-CM | POA: Diagnosis not present

## 2023-03-28 DIAGNOSIS — Z96642 Presence of left artificial hip joint: Secondary | ICD-10-CM

## 2023-03-28 DIAGNOSIS — D638 Anemia in other chronic diseases classified elsewhere: Secondary | ICD-10-CM

## 2023-03-28 DIAGNOSIS — Z01812 Encounter for preprocedural laboratory examination: Secondary | ICD-10-CM

## 2023-03-28 DIAGNOSIS — N184 Chronic kidney disease, stage 4 (severe): Secondary | ICD-10-CM

## 2023-03-28 DIAGNOSIS — E559 Vitamin D deficiency, unspecified: Secondary | ICD-10-CM

## 2023-03-28 DIAGNOSIS — Z85828 Personal history of other malignant neoplasm of skin: Secondary | ICD-10-CM | POA: Diagnosis not present

## 2023-03-28 DIAGNOSIS — J449 Chronic obstructive pulmonary disease, unspecified: Secondary | ICD-10-CM | POA: Insufficient documentation

## 2023-03-28 DIAGNOSIS — E1122 Type 2 diabetes mellitus with diabetic chronic kidney disease: Secondary | ICD-10-CM | POA: Diagnosis not present

## 2023-03-28 HISTORY — PX: TOTAL HIP ARTHROPLASTY: SHX124

## 2023-03-28 LAB — POCT I-STAT, CHEM 8
BUN: 39 mg/dL — ABNORMAL HIGH (ref 8–23)
Calcium, Ion: 1.14 mmol/L — ABNORMAL LOW (ref 1.15–1.40)
Chloride: 98 mmol/L (ref 98–111)
Creatinine, Ser: 3.2 mg/dL — ABNORMAL HIGH (ref 0.44–1.00)
Glucose, Bld: 120 mg/dL — ABNORMAL HIGH (ref 70–99)
HCT: 37 % (ref 36.0–46.0)
Hemoglobin: 12.6 g/dL (ref 12.0–15.0)
Potassium: 4 mmol/L (ref 3.5–5.1)
Sodium: 136 mmol/L (ref 135–145)
TCO2: 27 mmol/L (ref 22–32)

## 2023-03-28 LAB — GLUCOSE, CAPILLARY
Glucose-Capillary: 198 mg/dL — ABNORMAL HIGH (ref 70–99)
Glucose-Capillary: 206 mg/dL — ABNORMAL HIGH (ref 70–99)
Glucose-Capillary: 272 mg/dL — ABNORMAL HIGH (ref 70–99)
Glucose-Capillary: 362 mg/dL — ABNORMAL HIGH (ref 70–99)

## 2023-03-28 SURGERY — ARTHROPLASTY, HIP, TOTAL,POSTERIOR APPROACH
Anesthesia: Spinal | Site: Hip | Laterality: Left

## 2023-03-28 MED ORDER — EPHEDRINE SULFATE-NACL 50-0.9 MG/10ML-% IV SOSY
PREFILLED_SYRINGE | INTRAVENOUS | Status: DC | PRN
  Administered 2023-03-28: 5 mg via INTRAVENOUS
  Administered 2023-03-28 (×2): 10 mg via INTRAVENOUS

## 2023-03-28 MED ORDER — NITROGLYCERIN 0.4 MG SL SUBL: 0.4000 mg | SUBLINGUAL_TABLET | SUBLINGUAL | Status: DC | PRN

## 2023-03-28 MED ORDER — ALUM & MAG HYDROXIDE-SIMETH 200-200-20 MG/5ML PO SUSP: 30.0000 mL | ORAL | Status: DC | PRN

## 2023-03-28 MED ORDER — CLONIDINE HCL 0.1 MG PO TABS
0.1000 mg | ORAL_TABLET | Freq: Every day | ORAL | Status: DC
  Administered 2023-03-28: 0.1 mg via ORAL
  Filled 2023-03-28: qty 1

## 2023-03-28 MED ORDER — OXYCODONE HCL 5 MG/5ML PO SOLN: 5.0000 mg | Freq: Once | ORAL | Status: AC | PRN

## 2023-03-28 MED ORDER — TRANEXAMIC ACID-NACL 1000-0.7 MG/100ML-% IV SOLN
1000.0000 mg | Freq: Once | INTRAVENOUS | Status: AC
  Administered 2023-03-28: 1000 mg via INTRAVENOUS

## 2023-03-28 MED ORDER — INSULIN GLARGINE-YFGN 100 UNIT/ML ~~LOC~~ SOLN
24.0000 [IU] | Freq: Every morning | SUBCUTANEOUS | Status: DC
  Administered 2023-03-29: 24 [IU] via SUBCUTANEOUS
  Filled 2023-03-28: qty 0.24

## 2023-03-28 MED ORDER — PROPOFOL 500 MG/50ML IV EMUL
INTRAVENOUS | Status: DC | PRN
  Administered 2023-03-28: 75 ug/kg/min via INTRAVENOUS

## 2023-03-28 MED ORDER — DEXAMETHASONE SODIUM PHOSPHATE 10 MG/ML IJ SOLN
INTRAMUSCULAR | Status: AC
  Filled 2023-03-28: qty 1

## 2023-03-28 MED ORDER — HYDROXYCHLOROQUINE SULFATE 200 MG PO TABS
200.0000 mg | ORAL_TABLET | Freq: Every day | ORAL | Status: DC
  Administered 2023-03-29: 200 mg via ORAL
  Filled 2023-03-28 (×2): qty 1

## 2023-03-28 MED ORDER — OXYCODONE HCL 5 MG PO TABS
5.0000 mg | ORAL_TABLET | Freq: Once | ORAL | Status: AC | PRN
  Administered 2023-03-28: 5 mg via ORAL

## 2023-03-28 MED ORDER — PHENYLEPHRINE 80 MCG/ML (10ML) SYRINGE FOR IV PUSH (FOR BLOOD PRESSURE SUPPORT)
PREFILLED_SYRINGE | INTRAVENOUS | Status: DC | PRN
  Administered 2023-03-28: 80 ug via INTRAVENOUS

## 2023-03-28 MED ORDER — SENNOSIDES-DOCUSATE SODIUM 8.6-50 MG PO TABS
1.0000 | ORAL_TABLET | Freq: Two times a day (BID) | ORAL | Status: DC
  Filled 2023-03-28 (×2): qty 1

## 2023-03-28 MED ORDER — ONDANSETRON HCL 4 MG/2ML IJ SOLN
INTRAMUSCULAR | Status: DC | PRN
  Administered 2023-03-28: 4 mg via INTRAVENOUS

## 2023-03-28 MED ORDER — ACETAMINOPHEN 10 MG/ML IV SOLN
INTRAVENOUS | Status: DC | PRN
  Administered 2023-03-28: 1000 mg via INTRAVENOUS

## 2023-03-28 MED ORDER — DROPERIDOL 2.5 MG/ML IJ SOLN: 0.6250 mg | Freq: Once | INTRAMUSCULAR | Status: DC | PRN

## 2023-03-28 MED ORDER — ROSUVASTATIN CALCIUM 10 MG PO TABS
20.0000 mg | ORAL_TABLET | ORAL | Status: DC
  Administered 2023-03-29: 20 mg via ORAL
  Filled 2023-03-28: qty 2

## 2023-03-28 MED ORDER — CEFAZOLIN SODIUM-DEXTROSE 2-4 GM/100ML-% IV SOLN
2.0000 g | Freq: Four times a day (QID) | INTRAVENOUS | Status: DC
  Administered 2023-03-28: 2 g via INTRAVENOUS
  Filled 2023-03-28: qty 100

## 2023-03-28 MED ORDER — PHENYLEPHRINE HCL-NACL 20-0.9 MG/250ML-% IV SOLN
INTRAVENOUS | Status: DC | PRN
  Administered 2023-03-28: 40 ug/min via INTRAVENOUS

## 2023-03-28 MED ORDER — CARVEDILOL 3.125 MG PO TABS
6.2500 mg | ORAL_TABLET | Freq: Two times a day (BID) | ORAL | Status: DC
  Administered 2023-03-28 – 2023-03-29 (×2): 6.25 mg via ORAL
  Filled 2023-03-28 (×2): qty 2

## 2023-03-28 MED ORDER — DIPHENHYDRAMINE HCL 12.5 MG/5ML PO ELIX: 12.5000 mg | ORAL_SOLUTION | ORAL | Status: DC | PRN

## 2023-03-28 MED ORDER — OXYCODONE HCL 5 MG PO TABS
5.0000 mg | ORAL_TABLET | ORAL | Status: DC | PRN
  Administered 2023-03-28 – 2023-03-29 (×4): 5 mg via ORAL
  Filled 2023-03-28 (×4): qty 1

## 2023-03-28 MED ORDER — MENTHOL 3 MG MT LOZG: 1.0000 | LOZENGE | OROMUCOSAL | Status: DC | PRN

## 2023-03-28 MED ORDER — TORSEMIDE 20 MG PO TABS
40.0000 mg | ORAL_TABLET | Freq: Two times a day (BID) | ORAL | Status: DC
  Administered 2023-03-28 – 2023-03-29 (×2): 40 mg via ORAL
  Filled 2023-03-28 (×2): qty 2

## 2023-03-28 MED ORDER — KETAMINE HCL 50 MG/5ML IJ SOSY
PREFILLED_SYRINGE | INTRAMUSCULAR | Status: AC
  Filled 2023-03-28: qty 5

## 2023-03-28 MED ORDER — ACETAMINOPHEN 10 MG/ML IV SOLN: 1000.0000 mg | Freq: Once | INTRAVENOUS | Status: DC | PRN

## 2023-03-28 MED ORDER — TRANEXAMIC ACID-NACL 1000-0.7 MG/100ML-% IV SOLN
INTRAVENOUS | Status: AC
  Filled 2023-03-28: qty 100

## 2023-03-28 MED ORDER — PHENYLEPHRINE 80 MCG/ML (10ML) SYRINGE FOR IV PUSH (FOR BLOOD PRESSURE SUPPORT)
PREFILLED_SYRINGE | INTRAVENOUS | Status: AC
  Filled 2023-03-28: qty 10

## 2023-03-28 MED ORDER — SURGIPHOR WOUND IRRIGATION SYSTEM - OPTIME: TOPICAL | Status: DC | PRN

## 2023-03-28 MED ORDER — PHENYLEPHRINE HCL-NACL 20-0.9 MG/250ML-% IV SOLN
INTRAVENOUS | Status: AC
  Filled 2023-03-28: qty 250

## 2023-03-28 MED ORDER — OXYCODONE HCL 5 MG PO TABS
ORAL_TABLET | ORAL | Status: AC
  Filled 2023-03-28: qty 1

## 2023-03-28 MED ORDER — FENTANYL CITRATE (PF) 100 MCG/2ML IJ SOLN
25.0000 ug | INTRAMUSCULAR | Status: DC | PRN
  Administered 2023-03-28 (×6): 25 ug via INTRAVENOUS

## 2023-03-28 MED ORDER — ACETAMINOPHEN 10 MG/ML IV SOLN
INTRAVENOUS | Status: AC
  Filled 2023-03-28: qty 100

## 2023-03-28 MED ORDER — PANTOPRAZOLE SODIUM 40 MG PO TBEC
40.0000 mg | DELAYED_RELEASE_TABLET | Freq: Two times a day (BID) | ORAL | Status: DC
  Administered 2023-03-28 – 2023-03-29 (×3): 40 mg via ORAL
  Filled 2023-03-28 (×3): qty 1

## 2023-03-28 MED ORDER — CEFAZOLIN SODIUM-DEXTROSE 2-4 GM/100ML-% IV SOLN
INTRAVENOUS | Status: AC
  Filled 2023-03-28: qty 100

## 2023-03-28 MED ORDER — CITALOPRAM HYDROBROMIDE 10 MG PO TABS
20.0000 mg | ORAL_TABLET | ORAL | Status: DC
  Administered 2023-03-29: 20 mg via ORAL
  Filled 2023-03-28: qty 2

## 2023-03-28 MED ORDER — OMEGA-3-ACID ETHYL ESTERS 1 G PO CAPS
1.0000 g | ORAL_CAPSULE | Freq: Every day | ORAL | Status: DC
  Administered 2023-03-28 – 2023-03-29 (×2): 1 g via ORAL
  Filled 2023-03-28 (×2): qty 1

## 2023-03-28 MED ORDER — GENTAMICIN SULFATE 0.1 % EX CREA
1.0000 | TOPICAL_CREAM | Freq: Every day | CUTANEOUS | Status: DC
  Filled 2023-03-28: qty 15

## 2023-03-28 MED ORDER — SODIUM CHLORIDE 0.9 % IR SOLN
Status: DC | PRN
  Administered 2023-03-28: 3000 mL

## 2023-03-28 MED ORDER — BISACODYL 10 MG RE SUPP: 10.0000 mg | Freq: Every day | RECTAL | Status: DC | PRN

## 2023-03-28 MED ORDER — 0.9 % SODIUM CHLORIDE (POUR BTL) OPTIME
TOPICAL | Status: DC | PRN
  Administered 2023-03-28: 500 mL

## 2023-03-28 MED ORDER — CALCIUM CITRATE 950 (200 CA) MG PO TABS
1.0000 | ORAL_TABLET | Freq: Every day | ORAL | Status: DC
  Administered 2023-03-29: 200 mg via ORAL
  Filled 2023-03-28 (×2): qty 1

## 2023-03-28 MED ORDER — VITAMIN D 25 MCG (1000 UNIT) PO TABS
1000.0000 [IU] | ORAL_TABLET | Freq: Every day | ORAL | Status: DC
  Administered 2023-03-28 – 2023-03-29 (×2): 1000 [IU] via ORAL
  Filled 2023-03-28 (×2): qty 1

## 2023-03-28 MED ORDER — ACETAMINOPHEN 10 MG/ML IV SOLN
1000.0000 mg | Freq: Four times a day (QID) | INTRAVENOUS | Status: DC
  Administered 2023-03-28 – 2023-03-29 (×3): 1000 mg via INTRAVENOUS
  Filled 2023-03-28 (×4): qty 100

## 2023-03-28 MED ORDER — AMLODIPINE BESYLATE 5 MG PO TABS
5.0000 mg | ORAL_TABLET | Freq: Every day | ORAL | Status: DC
  Administered 2023-03-28: 5 mg via ORAL
  Filled 2023-03-28: qty 1

## 2023-03-28 MED ORDER — GABAPENTIN 300 MG PO CAPS
ORAL_CAPSULE | ORAL | Status: AC
  Filled 2023-03-28: qty 1

## 2023-03-28 MED ORDER — HYDROMORPHONE HCL 1 MG/ML IJ SOLN: 0.5000 mg | INTRAMUSCULAR | Status: DC | PRN

## 2023-03-28 MED ORDER — BUPIVACAINE HCL (PF) 0.5 % IJ SOLN
INTRAMUSCULAR | Status: AC
  Filled 2023-03-28: qty 10

## 2023-03-28 MED ORDER — MIDAZOLAM HCL 2 MG/2ML IJ SOLN
INTRAMUSCULAR | Status: AC
  Filled 2023-03-28: qty 2

## 2023-03-28 MED ORDER — ACETAMINOPHEN 325 MG PO TABS: 325.0000 mg | ORAL_TABLET | Freq: Four times a day (QID) | ORAL | Status: DC | PRN

## 2023-03-28 MED ORDER — RANOLAZINE ER 500 MG PO TB12
500.0000 mg | ORAL_TABLET | Freq: Two times a day (BID) | ORAL | Status: DC
  Administered 2023-03-28 – 2023-03-29 (×2): 500 mg via ORAL
  Filled 2023-03-28 (×2): qty 1

## 2023-03-28 MED ORDER — FENTANYL CITRATE (PF) 100 MCG/2ML IJ SOLN
INTRAMUSCULAR | Status: AC
  Filled 2023-03-28: qty 2

## 2023-03-28 MED ORDER — INSULIN ASPART 100 UNIT/ML IJ SOLN
0.0000 [IU] | Freq: Three times a day (TID) | INTRAMUSCULAR | Status: DC
  Administered 2023-03-28 – 2023-03-29 (×2): 3 [IU] via SUBCUTANEOUS
  Filled 2023-03-28 (×2): qty 1

## 2023-03-28 MED ORDER — INSULIN ASPART 100 UNIT/ML IJ SOLN
0.0000 [IU] | Freq: Every day | INTRAMUSCULAR | Status: DC
  Administered 2023-03-28: 5 [IU] via SUBCUTANEOUS
  Filled 2023-03-28: qty 1

## 2023-03-28 MED ORDER — INSULIN ASPART 100 UNIT/ML IJ SOLN
6.0000 [IU] | Freq: Every day | INTRAMUSCULAR | Status: DC
  Administered 2023-03-28: 6 [IU] via SUBCUTANEOUS
  Filled 2023-03-28: qty 1

## 2023-03-28 MED ORDER — DELFLEX-LC/1.5% DEXTROSE 344 MOSM/L IP SOLN
INTRAPERITONEAL | Status: DC
  Administered 2023-03-28: 6000 mL via INTRAPERITONEAL

## 2023-03-28 MED ORDER — LEVOTHYROXINE SODIUM 50 MCG PO TABS
75.0000 ug | ORAL_TABLET | Freq: Every day | ORAL | Status: DC
  Administered 2023-03-29: 75 ug via ORAL
  Filled 2023-03-28: qty 1

## 2023-03-28 MED ORDER — CHLORHEXIDINE GLUCONATE 0.12 % MT SOLN
OROMUCOSAL | Status: AC
  Filled 2023-03-28: qty 15

## 2023-03-28 MED ORDER — PROPOFOL 1000 MG/100ML IV EMUL
INTRAVENOUS | Status: AC
  Filled 2023-03-28: qty 100

## 2023-03-28 MED ORDER — KETAMINE HCL 10 MG/ML IJ SOLN
INTRAMUSCULAR | Status: DC | PRN
  Administered 2023-03-28: 20 mg via INTRAVENOUS

## 2023-03-28 MED ORDER — ALBUTEROL SULFATE (2.5 MG/3ML) 0.083% IN NEBU: 3.0000 mL | INHALATION_SOLUTION | Freq: Four times a day (QID) | RESPIRATORY_TRACT | Status: DC | PRN

## 2023-03-28 MED ORDER — SODIUM CHLORIDE 0.9 % IV SOLN: INTRAVENOUS | Status: DC

## 2023-03-28 MED ORDER — LOSARTAN POTASSIUM 50 MG PO TABS
100.0000 mg | ORAL_TABLET | Freq: Every day | ORAL | Status: DC
  Administered 2023-03-29: 100 mg via ORAL
  Filled 2023-03-28 (×2): qty 2

## 2023-03-28 MED ORDER — CEFAZOLIN SODIUM-DEXTROSE 2-4 GM/100ML-% IV SOLN
2.0000 g | Freq: Two times a day (BID) | INTRAVENOUS | Status: AC
  Administered 2023-03-29: 2 g via INTRAVENOUS
  Filled 2023-03-28: qty 100

## 2023-03-28 MED ORDER — FLEET ENEMA 7-19 GM/118ML RE ENEM: 1.0000 | ENEMA | Freq: Once | RECTAL | Status: DC | PRN

## 2023-03-28 MED ORDER — HYDROMORPHONE HCL 1 MG/ML IJ SOLN
0.2500 mg | INTRAMUSCULAR | Status: DC | PRN
  Administered 2023-03-28 (×2): 0.5 mg via INTRAVENOUS

## 2023-03-28 MED ORDER — METOCLOPRAMIDE HCL 5 MG PO TABS
10.0000 mg | ORAL_TABLET | Freq: Three times a day (TID) | ORAL | Status: DC
  Administered 2023-03-28 – 2023-03-29 (×3): 10 mg via ORAL
  Filled 2023-03-28 (×3): qty 2

## 2023-03-28 MED ORDER — FERROUS SULFATE 325 (65 FE) MG PO TABS
325.0000 mg | ORAL_TABLET | Freq: Two times a day (BID) | ORAL | Status: DC
  Administered 2023-03-28 – 2023-03-29 (×2): 325 mg via ORAL
  Filled 2023-03-28 (×2): qty 1

## 2023-03-28 MED ORDER — BUPIVACAINE HCL (PF) 0.5 % IJ SOLN
INTRAMUSCULAR | Status: DC | PRN
  Administered 2023-03-28: 3 mL via INTRATHECAL

## 2023-03-28 MED ORDER — ONDANSETRON HCL 4 MG/2ML IJ SOLN: 4.0000 mg | Freq: Four times a day (QID) | INTRAMUSCULAR | Status: DC | PRN

## 2023-03-28 MED ORDER — CALCITRIOL 0.25 MCG PO CAPS
0.2500 ug | ORAL_CAPSULE | Freq: Every day | ORAL | Status: DC
  Administered 2023-03-29: 0.25 ug via ORAL
  Filled 2023-03-28 (×2): qty 1

## 2023-03-28 MED ORDER — DELFLEX-LC/2.5% DEXTROSE 394 MOSM/L IP SOLN
INTRAPERITONEAL | Status: DC
  Administered 2023-03-28: 6000 mL via INTRAPERITONEAL

## 2023-03-28 MED ORDER — EPHEDRINE 5 MG/ML INJ
INTRAVENOUS | Status: AC
  Filled 2023-03-28: qty 5

## 2023-03-28 MED ORDER — TRAMADOL HCL 50 MG PO TABS: 50.0000 mg | ORAL_TABLET | ORAL | Status: DC | PRN

## 2023-03-28 MED ORDER — MAGNESIUM HYDROXIDE 400 MG/5ML PO SUSP
30.0000 mL | Freq: Every day | ORAL | Status: DC
  Filled 2023-03-28: qty 30

## 2023-03-28 MED ORDER — HYDROMORPHONE HCL 1 MG/ML IJ SOLN
INTRAMUSCULAR | Status: AC
  Filled 2023-03-28: qty 1

## 2023-03-28 MED ORDER — ISOSORBIDE MONONITRATE ER 30 MG PO TB24
120.0000 mg | ORAL_TABLET | Freq: Every day | ORAL | Status: DC
  Administered 2023-03-28: 120 mg via ORAL
  Filled 2023-03-28: qty 4

## 2023-03-28 MED ORDER — PROMETHAZINE HCL 25 MG/ML IJ SOLN: 6.2500 mg | INTRAMUSCULAR | Status: DC | PRN

## 2023-03-28 MED ORDER — PHENOL 1.4 % MT LIQD: 1.0000 | OROMUCOSAL | Status: DC | PRN

## 2023-03-28 MED ORDER — OXYCODONE HCL 5 MG PO TABS: 10.0000 mg | ORAL_TABLET | ORAL | Status: DC | PRN

## 2023-03-28 MED ORDER — ASPIRIN 81 MG PO CHEW
81.0000 mg | CHEWABLE_TABLET | Freq: Two times a day (BID) | ORAL | Status: DC
  Administered 2023-03-28 – 2023-03-29 (×2): 81 mg via ORAL
  Filled 2023-03-28 (×2): qty 1

## 2023-03-28 MED ORDER — ONDANSETRON HCL 4 MG PO TABS: 4.0000 mg | ORAL_TABLET | Freq: Four times a day (QID) | ORAL | Status: DC | PRN

## 2023-03-28 MED ORDER — MIDAZOLAM HCL 5 MG/5ML IJ SOLN
INTRAMUSCULAR | Status: DC | PRN
  Administered 2023-03-28: 1 mg via INTRAVENOUS

## 2023-03-28 SURGICAL SUPPLY — 56 items
BLADE SAW 90X25X1.19 OSCILLAT (BLADE) ×1 IMPLANT
DRAPE 3/4 80X56 (DRAPES) ×1 IMPLANT
DRAPE INCISE IOBAN 66X60 STRL (DRAPES) ×1 IMPLANT
DRSG AQUACEL AG ADV 3.5X14 (GAUZE/BANDAGES/DRESSINGS) IMPLANT
DRSG DERMACEA 8X12 NADH (GAUZE/BANDAGES/DRESSINGS) IMPLANT
DRSG MEPILEX SACRM 8.7X9.8 (GAUZE/BANDAGES/DRESSINGS) ×1 IMPLANT
DRSG OPSITE POSTOP 4X12 (GAUZE/BANDAGES/DRESSINGS) ×1 IMPLANT
DRSG OPSITE POSTOP 4X14 (GAUZE/BANDAGES/DRESSINGS) IMPLANT
DRSG TEGADERM 4X4.75 (GAUZE/BANDAGES/DRESSINGS) ×1 IMPLANT
DRSG TELFA 3X4 N-ADH STERILE (GAUZE/BANDAGES/DRESSINGS) ×1 IMPLANT
DURAPREP 26ML APPLICATOR (WOUND CARE) ×2 IMPLANT
ELECT CAUTERY BLADE 6.4 (BLADE) ×1 IMPLANT
ELECT REM PT RETURN 9FT ADLT (ELECTROSURGICAL) ×1
ELECTRODE REM PT RTRN 9FT ADLT (ELECTROSURGICAL) ×1 IMPLANT
GLOVE BIOGEL M STRL SZ7.5 (GLOVE) ×2 IMPLANT
GLOVE SRG 8 PF TXTR STRL LF DI (GLOVE) ×1 IMPLANT
GLOVE SURG UNDER POLY LF SZ8 (GLOVE) ×1
GOWN STRL REUS W/ TWL LRG LVL3 (GOWN DISPOSABLE) ×2 IMPLANT
GOWN STRL REUS W/TWL LRG LVL3 (GOWN DISPOSABLE) ×2
GOWN TOGA ZIPPER T7+ PEEL AWAY (MISCELLANEOUS) ×1 IMPLANT
HANDLE YANKAUER SUCT OPEN TIP (MISCELLANEOUS) ×1 IMPLANT
HEAD FEM STD 32X+1 STRL (Hips) IMPLANT
HEMOVAC 400CC 10FR (MISCELLANEOUS) ×1 IMPLANT
HOLDER FOLEY CATH W/STRAP (MISCELLANEOUS) ×1 IMPLANT
HOOD PEEL AWAY T7 (MISCELLANEOUS) IMPLANT
IV NS IRRIG 3000ML ARTHROMATIC (IV SOLUTION) ×1 IMPLANT
KIT PEG BOARD PINK (KITS) ×1 IMPLANT
KIT TURNOVER KIT A (KITS) ×1 IMPLANT
LINER MARATHON 10 DEG 32MMX450 (Hips) ×1 IMPLANT
LINER MARATHON 10D 32MMX450 (Hips) IMPLANT
MANIFOLD NEPTUNE II (INSTRUMENTS) ×2 IMPLANT
NDL SAFETY ECLIP 18X1.5 (MISCELLANEOUS) ×1 IMPLANT
NS IRRIG 1000ML POUR BTL (IV SOLUTION) ×1 IMPLANT
NS IRRIG 500ML POUR BTL (IV SOLUTION) IMPLANT
PACK HIP PROSTHESIS (MISCELLANEOUS) ×1 IMPLANT
PIN SECT CUP 50MM (Hips) IMPLANT
PULSAVAC PLUS IRRIG FAN TIP (DISPOSABLE) ×1
SOL PREP PVP 2OZ (MISCELLANEOUS) ×1
SOLUTION IRRIG SURGIPHOR (IV SOLUTION) ×1 IMPLANT
SOLUTION PREP PVP 2OZ (MISCELLANEOUS) ×1 IMPLANT
SPONGE DRAIN TRACH 4X4 STRL 2S (GAUZE/BANDAGES/DRESSINGS) ×1 IMPLANT
STAPLER SKIN PROX 35W (STAPLE) ×1 IMPLANT
STEM FEMORAL SZ 5MM STD ACTIS (Stem) IMPLANT
SUT ETHIBOND #5 BRAIDED 30INL (SUTURE) ×1 IMPLANT
SUT VIC AB 0 CT1 36 (SUTURE) ×1 IMPLANT
SUT VIC AB 1 CT1 36 (SUTURE) ×2 IMPLANT
SUT VIC AB 2-0 CT1 27 (SUTURE) ×1
SUT VIC AB 2-0 CT1 TAPERPNT 27 (SUTURE) ×1 IMPLANT
SYR 20ML LL LF (SYRINGE) ×1 IMPLANT
TAPE CLOTH 3X10 WHT NS LF (GAUZE/BANDAGES/DRESSINGS) ×1 IMPLANT
TAPE TRANSPORE STRL 2 31045 (GAUZE/BANDAGES/DRESSINGS) ×1 IMPLANT
TIP FAN IRRIG PULSAVAC PLUS (DISPOSABLE) ×1 IMPLANT
TOWEL OR 17X26 4PK STRL BLUE (TOWEL DISPOSABLE) IMPLANT
TRAP FLUID SMOKE EVACUATOR (MISCELLANEOUS) ×1 IMPLANT
TRAY FOLEY MTR SLVR 16FR STAT (SET/KITS/TRAYS/PACK) ×1 IMPLANT
WATER STERILE IRR 1000ML POUR (IV SOLUTION) ×1 IMPLANT

## 2023-03-28 NOTE — Plan of Care (Signed)
  Problem: Education: Goal: Ability to describe self-care measures that may prevent or decrease complications (Diabetes Survival Skills Education) will improve Outcome: Progressing Goal: Individualized Educational Video(s) Outcome: Progressing   Problem: Coping: Goal: Ability to adjust to condition or change in health will improve Outcome: Progressing   Problem: Fluid Volume: Goal: Ability to maintain a balanced intake and output will improve Outcome: Progressing   Problem: Health Behavior/Discharge Planning: Goal: Ability to identify and utilize available resources and services will improve Outcome: Progressing Goal: Ability to manage health-related needs will improve Outcome: Progressing   Problem: Metabolic: Goal: Ability to maintain appropriate glucose levels will improve Outcome: Progressing   Problem: Nutritional: Goal: Maintenance of adequate nutrition will improve Outcome: Progressing Goal: Progress toward achieving an optimal weight will improve Outcome: Progressing   Problem: Skin Integrity: Goal: Risk for impaired skin integrity will decrease Outcome: Progressing   Problem: Tissue Perfusion: Goal: Adequacy of tissue perfusion will improve Outcome: Progressing   Problem: Education: Goal: Knowledge of the prescribed therapeutic regimen will improve Outcome: Progressing Goal: Understanding of discharge needs will improve Outcome: Progressing Goal: Individualized Educational Video(s) Outcome: Progressing   Problem: Activity: Goal: Ability to avoid complications of mobility impairment will improve Outcome: Progressing Goal: Ability to tolerate increased activity will improve Outcome: Progressing   Problem: Clinical Measurements: Goal: Postoperative complications will be avoided or minimized Outcome: Progressing   Problem: Pain Management: Goal: Pain level will decrease with appropriate interventions Outcome: Progressing   Problem: Skin  Integrity: Goal: Will show signs of wound healing Outcome: Progressing   Problem: Education: Goal: Knowledge of General Education information will improve Description: Including pain rating scale, medication(s)/side effects and non-pharmacologic comfort measures Outcome: Progressing   Problem: Health Behavior/Discharge Planning: Goal: Ability to manage health-related needs will improve Outcome: Progressing   Problem: Clinical Measurements: Goal: Ability to maintain clinical measurements within normal limits will improve Outcome: Progressing Goal: Will remain free from infection Outcome: Progressing Goal: Diagnostic test results will improve Outcome: Progressing Goal: Respiratory complications will improve Outcome: Progressing Goal: Cardiovascular complication will be avoided Outcome: Progressing   Problem: Activity: Goal: Risk for activity intolerance will decrease Outcome: Progressing   Problem: Nutrition: Goal: Adequate nutrition will be maintained Outcome: Progressing   Problem: Coping: Goal: Level of anxiety will decrease Outcome: Progressing   Problem: Elimination: Goal: Will not experience complications related to bowel motility Outcome: Progressing Goal: Will not experience complications related to urinary retention Outcome: Progressing   Problem: Pain Managment: Goal: General experience of comfort will improve Outcome: Progressing   Problem: Safety: Goal: Ability to remain free from injury will improve Outcome: Progressing   Problem: Skin Integrity: Goal: Risk for impaired skin integrity will decrease Outcome: Progressing   

## 2023-03-28 NOTE — Interval H&P Note (Signed)
History and Physical Interval Note:  03/28/2023 6:18 AM  Alexandria Moody  has presented today for surgery, with the diagnosis of PRIMARY OSTEOARTHRITIS OF LEFT HIP.  The various methods of treatment have been discussed with the patient and family. After consideration of risks, benefits and other options for treatment, the patient has consented to  Procedure(s): TOTAL HIP ARTHROPLASTY - RNFA (Left) as a surgical intervention.  The patient's history has been reviewed, patient examined, no change in status, stable for surgery.  I have reviewed the patient's chart and labs.  Questions were answered to the patient's satisfaction.     Lorrena Goranson P Jarious Lyon

## 2023-03-28 NOTE — Anesthesia Preprocedure Evaluation (Signed)
Anesthesia Evaluation  Patient identified by MRN, date of birth, ID band Patient awake    Reviewed: Allergy & Precautions, H&P , NPO status , Patient's Chart, lab work & pertinent test results, reviewed documented beta blocker date and time   Airway Mallampati: II   Neck ROM: full    Dental  (+) Poor Dentition   Pulmonary neg pulmonary ROS, shortness of breath and with exertion, pneumonia, COPD,  COPD inhaler, former smoker   Pulmonary exam normal        Cardiovascular Exercise Tolerance: Poor hypertension, On Medications + angina with exertion + CAD, + Peripheral Vascular Disease, +CHF and + DOE  (-) Orthopnea negative cardio ROS Normal cardiovascular exam+ Valvular Problems/Murmurs MVP  Rhythm:regular Rate:Normal     Neuro/Psych  PSYCHIATRIC DISORDERS  Depression    TIAnegative neurological ROS  negative psych ROS   GI/Hepatic negative GI ROS, Neg liver ROS,GERD  Medicated,,  Endo/Other  negative endocrine ROSdiabetesHypothyroidism    Renal/GU Renal diseasenegative Renal ROS  negative genitourinary   Musculoskeletal   Abdominal   Peds  Hematology negative hematology ROS (+) Blood dyscrasia, anemia   Anesthesia Other Findings Past Medical History: No date: Adenomatous polyp of colon No date: Anemia of chronic renal failure     Comment:  a.) recieving iron infusions + epoetin alfa-epbx               (Retacrit) No date: Anginal pain No date: Aortic atherosclerosis No date: Arthritis No date: Atherosclerosis of native arteries of extremity with  intermittent claudication     Comment:  a.) ABI/TBI 06/24/2021: 0.95 RIGHT, 0.72 LEFT 07/25/2021: Bilateral carotid artery disease     Comment:  a.) carotid doppler 07/25/2021: < 50% RICA, < 50% LICA. No date: Carotid atherosclerosis, bilateral No date: CHF (congestive heart failure)     Comment:  a.) TTE 05/12/2020: LVEF 55-60%; mild LA enlargement;                trivial to mild MR; G1DD; b.) TTE 01/25/2023: EF 55-60%,               mild MR, G1DD. 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     Comment:  a.) smolding phase ANCA associated pauci immune No date: Heart murmur No date: HLD (hyperlipidemia) No date: Hypertension No date: Hypothyroidism No date: Interstitial lung disease No date: Long term current use of immunosuppressive drug     Comment:  a.) hydroxychloriquine for ANCA (+) glomerulonephritis No date: Lumbar degenerative disc disease No date: Macular degeneration No date: Osteoporosis No date: PAD (peripheral artery disease) 2023: Peritoneal dialysis catheter in place 11/2021: Pneumonia 2002: S/P CABG x 3     Comment:  a.) LIMA-LAD, SVG-diagnoal, SVG-OM No date: Skin cancer No date: TIA (transient ischemic attack) No date: Type 2 diabetes mellitus with chronic kidney disease on  chronic dialysis, with long-term current use of insulin No date: Vitamin D deficiency Past Surgical History: No date: ABDOMINAL HYSTERECTOMY     Comment:  age 27 02/03/2022: CAPD INSERTION; N/A     Comment:  Procedure: LAPAROSCOPIC INSERTION CONTINUOUS AMBULATORY               PERITONEAL DIALYSIS  (CAPD)  CATHETER;  Surgeon: Leafy Ro, MD;  Location: [facility] ORS;  Service: General;                Laterality: N/A;  Provider requesting 1.5 hours / 90               minutes for procedure. 2014: CATARACT EXTRACTION; Bilateral 2020: CHOLECYSTECTOMY No date: COLONOSCOPY 2002: CORONARY ARTERY BYPASS GRAFT; N/A     Comment:  Procedure: 3v CORONARY ARTERY ARTERY BYPASS GRAFT 07/22/2020: EYE SURGERY;  Left     Comment:  Dr. Kristine Royal, [facility]el No date: TONSILLECTOMY BMI    Body Mass Index: 26.91 kg/m     Reproductive/Obstetrics negative OB ROS                             Anesthesia Physical Anesthesia Plan  ASA: 3  Anesthesia Plan: Spinal   Post-op Pain Management:    Induction:   PONV Risk Score and Plan: 3  Airway Management Planned:   Additional Equipment:   Intra-op Plan:   Post-operative Plan:   Informed Consent: I have reviewed the patients History and Physical, chart, labs and discussed the procedure including the risks, benefits and alternatives for the proposed anesthesia with the patient or authorized representative who has indicated his/her understanding and acceptance.     Dental Advisory Given  Plan Discussed with: CRNA  Anesthesia Plan Comments:        Anesthesia Quick Evaluation

## 2023-03-28 NOTE — Progress Notes (Addendum)
[facility]  ROUNDING NOTE   Subjective:   Alexandria Moody is a 77 y.o. 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  Patient is known to our practice and receives peritoneal dialysis follow-up at [facility], supervised by Dr. Cherylann Ratel.  Patient performs dialysis 6 nights a week.  She states she did have treatment overnight however manually ended this morning prior to scheduled hip surgery.  Patient is seen resting in bed, daughter at bedside.  Patient currently denies any pain or discomfort from procedure.  According to daughter, surgery went well.  Patient will stay overnight and discharge tomorrow to continue PT at home.  We have been consulted to provide peritoneal dialysis treatment tonight.   Objective:  Vital signs in last 24 hours:  Temp:  [97.6 F (36.4 C)-98.8 F (37.1 C)] 98.8 F (37.1 C) (04/10 1308) Pulse Rate:  [53-105] 69 (04/10 1308) Resp:  [10-19] 15 (04/10 1308) BP: (102-149)/(55-88) 149/69 (04/10 1308) SpO2:  [97 %-100 %] 100 % (04/10 1308) Weight:  [68.9 kg] 68.9 kg (04/10 0617)  Weight change:  Filed Weights   03/28/23 0617  Weight: 68.9 kg    Intake/Output: No intake/output data recorded.   Intake/Output this shift:  Total I/O In: 800 [I.V.:500; IV Piggyback:300] Out: 175 [Urine:50; Drains:50; Blood:75]  Physical Exam: General: NAD  Head: Normocephalic, atraumatic. Moist oral mucosal membranes  Eyes: Anicteric  Lungs:  Clear to auscultation, normal effort  Heart: Regular rate and rhythm  Abdomen:  Soft, nontender, mild distention  Extremities: Trace peripheral edema.  Neurologic: Alert and oriented  moving all four extremities  Skin: No lesions  Access: PD catheter    Basic Metabolic Panel: Recent Labs  Lab 03/28/23 0625  NA  136  K 4.0  CL 98  GLUCOSE 120*  BUN 39*  CREATININE 3.20*    Liver Function Tests: No results for input(s): "AST", "ALT", "ALKPHOS", "BILITOT", "PROT", "ALBUMIN" in the last 168 hours. No results for input(s): "LIPASE", "AMYLASE" in the last 168 hours. No results for input(s): "AMMONIA" in the last 168 hours.  CBC: Recent Labs  Lab 03/28/23 0625  HGB 12.6  HCT 37.0    Cardiac Enzymes: No results for input(s): "CKTOTAL", "CKMB", "CKMBINDEX", "TROPONINI" in the last 168 hours.  BNP: Invalid input(s): "POCBNP"  CBG: Recent Labs  Lab 03/28/23 1105 03/28/23 1310  GLUCAP 198* 206*    Microbiology: Results for orders placed or performed during the hospital encounter of 03/20/23  Surgical pcr screen     Status: None   Collection Time: 03/20/23  3:58 PM   Specimen: Nasal Mucosa; Nasal Swab  Result Value Ref Range Status   MRSA, PCR NEGATIVE NEGATIVE Final   Staphylococcus aureus NEGATIVE NEGATIVE Final    Comment: (NOTE) The Xpert SA Assay (FDA approved for NASAL specimens in patients 95 years of age and older), is one component of a comprehensive surveillance program. It is not intended to diagnose infection nor to guide or monitor treatment. Performed at [facility], [address] [place], [place] [address]     Coagulation Studies: No results for input(s): "LABPROT", "INR" in the last 72 hours.  Urinalysis: No results for input(s): "COLORURINE", "LABSPEC", "PHURINE", "GLUCOSEU", "HGBUR", "BILIRUBINUR", "KETONESUR", "PROTEINUR", "UROBILINOGEN", "NITRITE", "LEUKOCYTESUR" in the last 72 hours.  Invalid input(s): "APPERANCEUR"    Imaging: DG HIP UNILAT WITH PELVIS 2-3 VIEWS LEFT  Result  Date: 03/28/2023 CLINICAL DATA:  Status post THA EXAM: DG HIP (WITH OR WITHOUT PELVIS) 2V LEFT COMPARISON:  None Available. FINDINGS: Two views status post left total hip arthroplasty demonstrating grossly anatomic alignment with no acute fracture. IMPRESSION:  Unremarkable appearance status post recent left total hip arthroplasty. Electronically Signed   By: Layla Maw M.D.   On: 03/28/2023 11:37     Medications:    sodium chloride 100 mL/hr at 03/28/23 1328   acetaminophen      ceFAZolin (ANCEF) IV 2 g (03/28/23 1328)    amLODipine  5 mg Oral QHS   aspirin  81 mg Oral BID   calcitRIOL  0.25 mcg Oral Daily   calcium citrate  1 tablet Oral Daily   carvedilol  6.25 mg Oral BID WC   cholecalciferol  1,000 Units Oral Daily   [START ON 03/29/2023] citalopram  20 mg Oral BH-q7a   cloNIDine  0.1 mg Oral Q lunch   ferrous sulfate  325 mg Oral BID WC   hydroxychloroquine  200 mg Oral Daily   insulin aspart  0-5 Units Subcutaneous QHS   insulin aspart  0-6 Units Subcutaneous TID WC   insulin aspart  6 Units Subcutaneous Q supper   [START ON 03/29/2023] insulin glargine-yfgn  24 Units Subcutaneous q morning   isosorbide mononitrate  120 mg Oral QHS   [START ON 03/29/2023] levothyroxine  75 mcg Oral QAC breakfast   losartan  100 mg Oral Daily   magnesium hydroxide  30 mL Oral Daily   metoCLOPramide  10 mg Oral TID AC & HS   omega-3 acid ethyl esters  1 g Oral Daily   pantoprazole  40 mg Oral BID   ranolazine  500 mg Oral BID   [START ON 03/29/2023] rosuvastatin  20 mg Oral BH-q7a   senna-docusate  1 tablet Oral BID   torsemide  40 mg Oral BID   [START ON 03/29/2023] acetaminophen, albuterol, alum & mag hydroxide-simeth, bisacodyl, diphenhydrAMINE, HYDROmorphone (DILAUDID) injection, menthol-cetylpyridinium **OR** phenol, nitroGLYCERIN, ondansetron **OR** ondansetron (ZOFRAN) IV, oxyCODONE, oxyCODONE, sodium phosphate, traMADol  Assessment/ Plan:  [name] is a 77 y.o.  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   [facility] Lesia Sago CCPD 9 hours 4 exchanges 2.5 liter fills tidal 80% 69kg 6 days a week.  End Stage Renal Disease on peritoneal dialysis. Resume home orders.  Patient will receive peritoneal dialysis treatment tonight.  Treatment will begin with initial drain to remove fluid from last night's treatment.   Hypertension: home regimen of carvedilol, amlodipine, hydralazine, isosorbide mononitrate, losartan, and torsemide.  Blood pressure slightly elevated but acceptable.  Anemia with chronic kidney disease: Patient receives Mircera at outpatient clinic.  Hemoglobin 12.6, acceptable for this patient.  Secondary Hyperparathyroidism: not currently on binders.  Calcium 8.9.  Will continue to monitor bone minerals.  Diabetes mellitus type II with chronic kidney disease: insulin dependent. Hemoglobin A1c of 7.3% on 12/28/22. Complicated with diabetic retinopathy. Continue glucose control.   6.  Left total hip arthroplasty, orthopedics completed surgery on 03/28/2023.  Plan for overnight stay and discharge tomorrow with home therapy.   LOS: 0 Alexandria Moody 4/10/20241:54 PM

## 2023-03-28 NOTE — Evaluation (Signed)
Physical Therapy Evaluation Patient Details Name: Alexandria Moody MRN: [id number] DOB: 1946/09/29 Today's Date: 03/28/2023  History of Present Illness  Pt is a  77 y.o. female s/p L posterior THA.  Clinical Impression  Pt admitted with above diagnosis. Pt currently with functional limitations due to the deficits listed below (see PT Problem List). Pt received upright in bed agreeable to PT services with daughter present. At baseline pt lives alone in a first floor apartment and is mod-I with her RW and independent with her ADL's. Prior to mobility pt educated ob WB and posterior hip precautions and how it relates to functional mobility. Pt with excellent L ankle mobility, normal sensation to LT and quad activation via quad set and SLR.   To date, pt is minguard for all mobility today transferring from supine to sitting on R side of bed, STS at RW, and SPT using RW. Pt has increased effort and use of bed features for bed mobility and requires sitting 2-3 minutes prior to standing due to mild dizziness that improves with time. No buckling of L knee in standing or stance phase of gait but requires min VC's for sequencing RW with SPT to recliner and education for safe hand placement and sitting technique. Reviewed and performed HEP as listed below. Anticipate pt will be appropriate to f/u PT recs pending gait progression and stairs training. Pt with all needs in reach with pillows placed to prevent hip IR/adduction. Pt and daughter understanding of call bell and LE positioning.     Recommendations for follow up therapy are one component of a multi-disciplinary discharge planning process, led by the attending physician.  Recommendations may be updated based on patient status, additional functional criteria and insurance authorization.     Assistance Recommended at Discharge Frequent or constant Supervision/Assistance  Patient can return home with the following  A little help with walking and/or transfers;A  little help with bathing/dressing/bathroom;Assistance with cooking/housework;Assist for transportation;Help with stairs or ramp for entrance    Equipment Recommendations None recommended by PT  Recommendations for Other Services       Functional Status Assessment Patient has had a recent decline in their functional status and demonstrates the ability to make significant improvements in function in a reasonable and predictable amount of time.     Precautions / Restrictions Precautions Precautions: Posterior Hip;Fall Precaution Booklet Issued: No Restrictions Weight Bearing Restrictions: Yes LLE Weight Bearing: Weight bearing as tolerated      Mobility  Bed Mobility Overal bed mobility: Needs Assistance Bed Mobility: Supine to Sit     Supine to sit: HOB elevated, Min guard       Patient Response: Cooperative  Transfers Overall transfer level: Needs assistance Equipment used: Rolling walker (2 wheels) Transfers: Sit to/from Stand, Bed to chair/wheelchair/BSC Sit to Stand: Min guard   Step pivot transfers: Min guard       General transfer comment: VC's for hand placement, increased time/effort to stand. VC's sequencing RW with steps to recliner.    Ambulation/Gait                  Stairs            Wheelchair Mobility    Modified Rankin (Stroke Patients Only)       Balance Overall balance assessment: Mild deficits observed, not formally tested  Pertinent Vitals/Pain Pain Assessment Pain Assessment: Faces Faces Pain Scale: Hurts little more Pain Location: L hip/groin Pain Descriptors / Indicators: Aching, Sharp, Burning Pain Intervention(s): Limited activity within patient's tolerance, Monitored during session, Repositioned, Ice applied    Home Living Family/patient expects to be discharged to:: Private residence Living Arrangements: Alone Available Help at Discharge:  Family;Available 24 hours/day Type of Home: Apartment Home Access: Level entry       Home Layout: One level Home Equipment: Agricultural consultant (2 wheels);Rollator (4 wheels);BSC/3in1;Shower seat;Grab bars - tub/shower      Prior Function Prior Level of Function : Independent/Modified Independent             Mobility Comments: using RW over the last few months       Hand Dominance        Extremity/Trunk Assessment   Upper Extremity Assessment Upper Extremity Assessment: Defer to OT evaluation    Lower Extremity Assessment Lower Extremity Assessment: Generalized weakness;LLE deficits/detail LLE Deficits / Details: L posterior THA LLE Sensation: WNL       Communication   Communication: No difficulties  Cognition Arousal/Alertness: Awake/alert Behavior During Therapy: WFL for tasks assessed/performed Overall Cognitive Status: Within Functional Limits for tasks assessed                                          General Comments General comments (skin integrity, edema, etc.): Fair to good static sitting and standing balance    Exercises Total Joint Exercises Ankle Circles/Pumps: AROM, Strengthening, Both, 10 reps, Seated Quad Sets: AROM, Strengthening, Supine, Left, 10 reps Gluteal Sets: AROM, Strengthening, Seated, Both, 10 reps Short Arc Quad: AROM, Strengthening, Left, 10 reps, Supine Hip ABduction/ADduction: AAROM, Strengthening, Left, 10 reps, Supine Long Arc Quad: AROM, Strengthening, Left, 10 reps, Seated Other Exercises Other Exercises: Role of PT in acute setting, d/c recs, WB and post hip precautions, HEP.   Assessment/Plan    PT Assessment Patient needs continued PT services  PT Problem List Decreased strength;Pain;Decreased range of motion;Decreased balance;Decreased knowledge of use of DME       PT Treatment Interventions DME instruction;Therapeutic exercise;Gait training;Balance training;Stair training;Neuromuscular  re-education;Functional mobility training;Therapeutic activities;Patient/family education    PT Goals (Current goals can be found in the Care Plan section)  Acute Rehab PT Goals Patient Stated Goal: To go home PT Goal Formulation: With patient/family Time For Goal Achievement: 04/11/23 Potential to Achieve Goals: Good    Frequency BID     Co-evaluation               AM-PAC PT "6 Clicks" Mobility  Outcome Measure Help needed turning from your back to your side while in a flat bed without using bedrails?: A Little Help needed moving from lying on your back to sitting on the side of a flat bed without using bedrails?: A Little Help needed moving to and from a bed to a chair (including a wheelchair)?: A Little Help needed standing up from a chair using your arms (e.g., wheelchair or bedside chair)?: A Little Help needed to walk in hospital room?: A Little Help needed climbing 3-5 steps with a railing? : A Lot 6 Click Score: 17    End of Session Equipment Utilized During Treatment: Gait belt Activity Tolerance: Patient tolerated treatment well Patient left: in chair;with call bell/phone within reach;with chair alarm set   PT Visit Diagnosis: Other abnormalities of gait and  mobility (R26.89);Muscle weakness (generalized) (M62.81);Difficulty in walking, not elsewhere classified (R26.2);Pain Pain - Right/Left: Left Pain - part of body: Hip    Time: [id number] PT Time Calculation (min) (ACUTE ONLY): 32 min   Charges:   PT Evaluation $PT Eval Low Complexity: 1 Low PT Treatments $Therapeutic Exercise: 8-22 mins        Seidy Labreck M. Fairly IV, PT, DPT Physical Therapist- [facility]  03/28/2023, 3:42 PM

## 2023-03-28 NOTE — Transfer of Care (Signed)
Immediate Anesthesia Transfer of Care Note  Patient: Alexandria Moody  Procedure(s) Performed: TOTAL HIP ARTHROPLASTY (Left: Hip)  Patient Location: PACU  Anesthesia Type:Spinal  Level of Consciousness: awake, alert , and oriented  Airway & Oxygen Therapy: Patient Spontanous Breathing and Patient connected to face mask oxygen  Post-op Assessment: Report given to RN, Post -op Vital signs reviewed and stable, and Patient moving all extremities X 4  Post vital signs: Reviewed and stable  Last Vitals:  Vitals Value Taken Time  BP 112/57 03/28/23 1057  Temp    Pulse 53 03/28/23 1059  Resp 15 03/28/23 1059  SpO2 100 % 03/28/23 1059  Vitals shown include unvalidated device data.  Last Pain:  Vitals:   03/28/23 0617  TempSrc: Temporal  PainSc: 0-No pain      Patients Stated Pain Goal: 0 (03/28/23 0617)  Complications: No notable events documented.

## 2023-03-28 NOTE — Progress Notes (Signed)
     Peritoneal Dialysis Treatment Initiation Note  Tx initiated without difficulty, with daughter @ bedside.   Pre Treatment Weight:70.6kg   Consent signed and in chart.  PD treatment initiated via aseptic technique.    Patient is awake and alert. No complaints of pain.     Dressing changed PD exit site clean, dry and intact,Pt refused Gentamicin     Hand-off given to the patient's nurse.  Education provided to dept staff  regarding PD machine and how  to contact tech support if machine  alarms.      Adah Salvage RN [facility]

## 2023-03-28 NOTE — Anesthesia Procedure Notes (Signed)
Spinal  Patient location during procedure: OR Start time: 03/28/2023 7:21 AM End time: 03/28/2023 7:26 AM Reason for block: surgical anesthesia Staffing Performed: resident/CRNA  Resident/CRNA: Nelle Don, CRNA Performed by: Nelle Don, CRNA Authorized by: Yevette Edwards, MD   Preanesthetic Checklist Completed: patient identified, IV checked, site marked, risks and benefits discussed, surgical consent, monitors and equipment checked and pre-op evaluation Spinal Block Patient position: sitting Prep: ChloraPrep Patient monitoring: heart rate, continuous pulse ox and blood pressure Approach: midline Location: L3-4 Injection technique: single-shot Needle Needle type: Pencan  Needle gauge: 24 G Needle length: 10 cm Additional Notes Expiration date of kit checked. Clear CSF flow prior to injection. Pt tolerated well.

## 2023-03-28 NOTE — Inpatient Diabetes Management (Signed)
Inpatient Diabetes Program Recommendations  AACE/ADA: New Consensus Statement on Inpatient Glycemic Control   Target Ranges:  Prepandial:   less than 140 mg/dL      Peak postprandial:   less than 180 mg/dL (1-2 hours)      Critically ill patients:  140 - 180 mg/dL    Latest Reference Range & Units 03/28/23 11:05 03/28/23 13:10  Glucose-Capillary 70 - 99 mg/dL 847 (H) 841 (H)    Latest Reference Range & Units 03/28/23 06:25  Glucose 70 - 99 mg/dL 282 (H)   Review of Glycemic Control  Diabetes history: DM2 Outpatient Diabetes medications: Toujeo 24 units QAM, Novolog 6 units with supper Current orders for Inpatient glycemic control: Semglee 24 units QAM, Novolog 0-6 units TID with meals, Novolog 0-5 units QHS, Novolog 6 units TID with meals   NOTE: Noted consult for diabetes coordinator for "IDDM, ESRD; s/p left total hip arthroplasty".  In reviewing chart, noted patient received Decadron 8 mg at 6:44 am today. Per chart, patient sees Dr. Tedd Sias for DM control and was last seen on 02/27/23. Per office note on 02/27/23 patient was taking Toujeo 24 units QAM and Novolog 6 units with supper. Patient was asked to increase Toujeo to 30 units QAM and take Novolog 6 units with all meals. Called patient's room to inquire about outpatient DM medications and patient's daughter answered the phone. Patient's daughter states that patient is taking Toujeo 24 units QAM and Novolog 6 units with supper. She reports that patient took Toujeo 12 units this morning as instructed. Informed her that patient received Decadron 8 mg this morning which is a steroid and will likely contribute to hyperglycemia. Discussed current insulin orders and explained that a Novolog correction scale would be used to correct glucose if needed in addition to Novolog 6 units TID with all meals. Patient's daughter verbalized understanding of information discussed. Diabetes team will follow along while inpatient and make further recommendations  if needed as more data is collected.  Thanks, Orlando Penner, RN, MSN, CDCES Diabetes Coordinator Inpatient Diabetes Program [phone] (Team Pager from 8am to 5pm)

## 2023-03-28 NOTE — H&P (Signed)
ORTHOPAEDIC HISTORY & PHYSICAL Latanya Maudlin, PA - 03/20/2023 2:00 PM EDT Formatting of this note is different from the original. Images from the original note were not included. Chief Complaint Chief Complaint Patient presents with Hip Pain H & P LEFT HIP  Reason for Visit Alexandria Moody is a 77 y.o. who presents today for a history and physical. She is to undergo a left total hip arthroplasty on 03/28/2023. Since her last visit at the clinic there is been no improvement in her condition. Patient expresses her desire to proceed with surgery.  She reports a several month history of bilateral hip and groin pain with the left hip significantly more symptomatic. The pain is worse with weight bearing. The hip pain limits the patient's ability to ambulate long distances. The patient has not appreciated any significant improvement despite Tylenol, Plaquenil, activity modification, and ambulatory aids. She is using a walker for ambulation. The patient states that the hip pain has progressed to the point that it is significantly interfering with her activities of daily living.  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 (CMS-HCC) S/P 3-vessel CABG 2002 Bronchiectasis without complication (CMS-HCC) 09/10/2020 Carotid atherosclerosis, bilateral Chronic diastolic congestive heart failure (CMS-HCC) Chronic obstructive pulmonary disease (CMS-HCC) 03/28/2017 Diabetic retinopathy of both eyes (CMS-HCC) End-stage renal disease on peritoneal dialysis (CMS-HCC) GERD (gastroesophageal reflux disease) 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 treated with prednisone and cyclophosphamide Home Dialysis Hyperlipidemia associated with type 2 diabetes mellitus Hypertension associated with type 2 diabetes mellitus 07/29/2011 Hypothyroidism (acquired) Lumbar degenerative disc disease Major depression in full remission (CMS-HCC) Osteoporosis, postmenopausal PAD (peripheral artery disease) (CMS-HCC) Seborrheic dermatitis Sensorineural hearing loss (SNHL) of both ears TIA (transient ischemic attack) 2019 Vitamin D deficiency  Past Surgical History Past Surgical History: Procedure Laterality Date TONSILLECTOMY Bilateral 1953 HYSTERECTOMY VAGINAL 1971 for descensus CORONARY ARTERY BYPASS W/VENOUS & ARTERIAL GRAFTS 01/2001 3 vessels LAPAROSCOPIC CHOLECYSTECTOMY 2016 CAROTID ENDARTERECTOMY Left CAROTID ENDARTERECTOMY Right EXTRACTION CATARACT EXTRACAPSULAR W/INSERTION INTRAOCULAR PROSTHESIS Left EXTRACTION CATARACT EXTRACAPSULAR W/INSERTION INTRAOCULAR PROSTHESIS Right skin cancer removal  Past Family History Family History Problem Relation Age of Onset Dementia Mother Breast cancer Mother in 36s Bipolar disorder Mother Heart valve disease Mother S/P replacement Stroke Mother Alcohol abuse Father Seizures Father Cancer Maternal Grandmother Alzheimer's disease Maternal Grandfather No Known Problems Paternal Grandmother No Known Problems Paternal Grandfather Sjogren's syndrome Daughter Raynaud syndrome Daughter High blood pressure (Hypertension) Daughter  Medications Current Outpatient Medications Ordered in Epic Medication Sig Dispense Refill acetaminophen (TYLENOL) 500 MG tablet Take 1,000 mg by mouth every 6 (six) hours as needed for Pain albuterol MDI, PROVENTIL, VENTOLIN, PROAIR, HFA 90 mcg/actuation inhaler Inhale into the lungs every 6 (six) hours as needed amLODIPine (NORVASC) 5 MG tablet Take 5 mg by mouth once daily ammonium lactate (LAC-HYDRIN) 12 % lotion Apply topically 2 (two) times daily  400 g 0 aspirin 81 MG EC tablet Take 81 mg by mouth once daily calcitRIOL (ROCALTROL) 0.25 MCG capsule Take 0.25 mcg by mouth once daily CALCIUM CITRATE ORAL Take 600 mg by mouth once daily carvediloL (COREG) 6.25 MG tablet Take 6.25 mg by mouth 2 (two) times daily with meals cholecalciferol 1000 unit tablet Take by mouth citalopram (CELEXA) 20 MG tablet Take 25 mg by  mouth once daily cloNIDine HCL (CATAPRES) 0.1 MG tablet Take 0.1 mg by mouth 2 (two) times daily CONCENTRATED insulin glargine (TOUJEO SOLOSTAR U-300 INSULIN) pen injector (concentration 300 units/mL) Inject 30 Units subcutaneously every morning or as directed 4.5 mL 5 denosumab (PROLIA SUBQ) Inject subcutaneously docosahexaenoic acid-epa 120-180 mg Cap Take 1,000 mg by mouth 2 (two) times daily fluticasone furoate-vilanteroL 200-25 mcg/dose DsDv Inhale 1 Puff into the lungs once daily hydrALAZINE (APRESOLINE) 50 MG tablet Take 50 mg by mouth once daily hydroxychloroquine (PLAQUENIL) 200 mg tablet Take 1 tablet (200 mg total) by mouth once daily 90 tablet 1 insulin ASPART (NOVOLOG FLEXPEN) pen injector (concentration 100 units/mL) Inject 6 units twice daily before the meals (before morning meal and before supper meal). 15 mL 5 isosorbide mononitrate (IMDUR) 120 MG ER tablet TAKE 1 TABLET (120 MG TOTAL) BY MOUTH NIGHTLY 90 tablet 3 levothyroxine (SYNTHROID) 75 MCG tablet Take 1 tablet (75 mcg total) by mouth every morning before breakfast 90 tablet 3 losartan (COZAAR) 100 MG tablet Take 100 mg by mouth once daily nitroGLYcerin (NITROSTAT) 0.4 MG SL tablet Place 1 tablet (0.4 mg total) under the tongue every 5 (five) minutes as needed for Chest pain May take up to 3 doses. 25 tablet 3 NOVOFINE PLUS 32 gauge x 1/6" Ndle Use 1 each once daily as directed for insulin injections (Patient not taking: Reported on 01/02/2023) 100 each 3 omeprazole (PRILOSEC) 20 MG DR capsule Take 1 capsule (20 mg total) by mouth every morning 90 capsule  3 pen needle, diabetic (BD NANO 2ND GEN PEN NEEDLE) 32 gauge x 5/32" Ndle Inject 1 each subcutaneously once daily AS DIRECTED (Patient not taking: Reported on 01/02/2023) 100 each 3 polyethylene glycol (MIRALAX) packet Take 17 g by mouth once daily Mix in 4-8ounces of fluid prior to taking. ranolazine (RANEXA) 500 MG ER tablet Take 500 mg by mouth 2 (two) times daily rosuvastatin (CRESTOR) 20 MG tablet TAKE 1 TABLET BY MOUTH EVERY DAY 90 tablet 3 sennosides (SENOKOT) 8.6 mg tablet Take 1 tablet by mouth once daily TORsemide (DEMADEX) 20 MG tablet Take 20 mg by mouth 4 (four) times daily  No current Epic-ordered facility-administered medications on file.  Allergies Allergies Allergen Reactions Adhesive Tape-Silicones Other (See Comments) Tears skin   Review of Systems A comprehensive 14 point ROS was performed, reviewed, and the pertinent orthopaedic findings are documented in the HPI.  Exam Ht 160 cm (5\' 3" )  BMI 27.28 kg/m  General: Well-developed well-nourished female seen in no acute distress.  HEENT: Atraumatic,normocephalic. Pupils are equal and reactive to light. Oropharynx is clear with moist mucosa  Lungs: Clear to auscultation bilaterally  Cardiovascular: Regular rate and rhythm. Normal S1, S2. Grade 2/6 systolic murmur. No appreciable gallops or rubs. Peripheral pulses are palpable.  Abdomen: Soft, non-tender, nondistended. Bowel sounds present  Extremity: Left Hip: Soft tissue swelling: Negative Erythema: Negative Crepitance: Positive Tenderness: Greater trochanter is nontender to palpation. Severe pain is elicited by axial compression or extremes of rotation. Atrophy: No atrophy. Fair to good hip flexor and abductor strength. Range of Motion: EXT/FLEX: 0/0/100 ADD/ABD: 20/0/20 IR/ER: 0/0/20  Neurological:  The patient is alert and oriented Sensation to light touch appears to be intact and within normal limits Gross motor strength appeared to be equal to  5/5  Vascular :  Peripheral pulses felt to be palpable. Capillary refill appears to be intact and within normal limits  X-ray  1. 3 views of the left hip ordered and interpreted on today's visit  shows complete collapse of the articular space of the left hip. She is noted to have subchondral porosis as well as osteophyte formation and cystic changes to the femoral head. Femoral head is irregular in shape because of the degenerative changes. No acute bony abnormalities noted. Is also noted to have severe degenerative arthrosis to the right hip.  Impression  1. Degenerative arthrosis left hip  Plan  1. I have gone over the patient's medications on today's visit. 2. Past medical history was reviewed 3. Return to clinic 6 weeks postop. Sooner if any problems 4. Did discuss postop rehab course  This note was generated in part with voice recognition software and I apologize for any typographical errors that were not detected and corrected   Tera PartridgeJon R Wolfe PA Electronically signed by Latanya MaudlinWolfe, Jon Richard, PA at 03/20/2023 3:35 PM EDT

## 2023-03-28 NOTE — Anesthesia Procedure Notes (Signed)
Procedure Name: MAC Date/Time: 03/28/2023 7:20 AM  Performed by: Nelle Don, CRNAPre-anesthesia Checklist: Patient identified, Emergency Drugs available, Suction available and Patient being monitored Oxygen Delivery Method: Simple face mask

## 2023-03-28 NOTE — Op Note (Signed)
OPERATIVE NOTE  DATE OF SURGERY:  03/28/2023  PATIENT NAME:  Alexandria Moody   DOB: 1946/04/04  MRN: [id number]  PRE-OPERATIVE DIAGNOSIS: Degenerative arthrosis of the left hip, primary  POST-OPERATIVE DIAGNOSIS:  Same  PROCEDURE:  Left total hip arthroplasty  SURGEON:  Jena Gauss. M.D.  ANESTHESIA: spinal  ESTIMATED BLOOD LOSS: 75 mL  FLUIDS REPLACED: 500 mL of crystalloid  DRAINS: 2 medium Hemovac drains  IMPLANTS UTILIZED: DePuy size 5 standard offset Actis femoral stem, 50 mm OD Pinnacle 100 acetabular component, +4 mm degree Pinnacle Marathon polyethylene insert, and a 32 mm CoCr +1 mm hip ball  INDICATIONS FOR SURGERY: Alexandria Moody is a 77 y.o. year old female with a long history of progressive hip and groin  pain. X-rays demonstrated severe degenerative changes. The patient had not seen any significant improvement despite conservative nonsurgical intervention. After discussion of the risks and benefits of surgical intervention, the patient expressed understanding of the risks benefits and agree with plans for total hip arthroplasty.   The risks, benefits, and alternatives were discussed at length including but not limited to the risks of infection, bleeding, nerve injury, stiffness, blood clots, the need for revision surgery, limb length inequality, dislocation, cardiopulmonary complications, among others, and they were willing to proceed.  PROCEDURE IN DETAIL: The patient was brought into the operating room and, after adequate spinal anesthesia was achieved, the patient was placed in a right lateral decubitus position. Axillary roll was placed and all bony prominences were well-padded. The patient's left hip was cleaned and prepped with alcohol and DuraPrep and draped in the usual sterile fashion. A "timeout" was performed as per usual protocol. A lateral curvilinear incision was made gently curving towards the posterior superior iliac spine. The IT band was incised in line  with the skin incision and the fibers of the gluteus maximus were split in line. The piriformis tendon was identified, skeletonized, and incised at its insertion to the proximal femur and reflected posteriorly. A T type posterior capsulotomy was performed. Prior to dislocation of the femoral head, a threaded Steinmann pin was inserted through a separate stab incision into the pelvis superior to the acetabulum and bent in the form of a stylus so as to assess limb length and hip offset throughout the procedure. The femoral head was then dislocated posteriorly. Inspection of the femoral head demonstrated severe degenerative changes with full-thickness loss of articular cartilage. The femoral neck cut was performed using an oscillating saw. The anterior capsule was elevated off of the femoral neck using a periosteal elevator. Attention was then directed to the acetabulum. The remnant of the labrum was excised using electrocautery. Inspection of the acetabulum also demonstrated significant degenerative changes. The acetabulum was reamed in sequential fashion up to a 49 mm diameter. Good punctate bleeding bone was encountered. A 50 mm Pinnacle 100 acetabular component was positioned and impacted into place. Good scratch fit was appreciated. A neutral polyethylene trial was inserted.  Attention was then directed to the proximal femur.  Femoral broaches were inserted in a sequential fashion up to a size 5 broach. Calcar region was planed and a trial reduction was performed using a standard offset neck and a 32 mm hip ball with a +1 mm neck length.  Fair stability was noted but it was elected to trial again with a +4 mm 10 degree liner with the high side at the 5 o'clock position.  Good equalization of limb lengths and hip offset was appreciated and excellent stability was  noted both anteriorly and posteriorly. Trial components were removed. The acetabular shell was irrigated with copious amounts of normal saline with  antibiotic solution and suctioned dry. A +4 mm 10 degree Pinnacle Marathon polyethylene insert was positioned with the high side at the 5 o'clock position and impacted into place. Next, a size 5 standard offset Actis femoral stem was positioned and impacted into place. Excellent scratch fit was appreciated. A trial reduction was again performed with a 32 mm hip ball with a +1 mm neck length. Again, good equalization of limb lengths was appreciated and excellent stability appreciated both anteriorly and posteriorly. The hip was then dislocated and the trial hip ball was removed. The Morse taper was cleaned and dried. A 32 mm cobalt chromium hip ball with a +1 mm neck length was placed on the trunnion and impacted into place. The hip was then reduced and placed through range of motion. Excellent stability was appreciated both anteriorly and posteriorly.  The wound was irrigated with copious amounts of normal saline followed by 450 ml of Surgiphor and suctioned dry. Good hemostasis was appreciated. The posterior capsulotomy was repaired using #5 Ethibond. Piriformis tendon was reapproximated to the undersurface of the gluteus medius tendon using #5 Ethibond. The IT band was reapproximated using interrupted sutures of #1 Vicryl. Subcutaneous tissue was approximated using first #0 Vicryl followed by #2-0 Vicryl. The skin was closed with skin staples.  The patient tolerated the procedure well and was transported to the recovery room in stable condition.   Jena Gauss., M.D.

## 2023-03-29 ENCOUNTER — Encounter: Payer: Self-pay | Admitting: Orthopedic Surgery

## 2023-03-29 DIAGNOSIS — M1612 Unilateral primary osteoarthritis, left hip: Secondary | ICD-10-CM | POA: Diagnosis not present

## 2023-03-29 LAB — GLUCOSE, CAPILLARY
Glucose-Capillary: 290 mg/dL — ABNORMAL HIGH (ref 70–99)
Glucose-Capillary: 235 mg/dL — ABNORMAL HIGH (ref 70–99)

## 2023-03-29 MED ORDER — ONDANSETRON HCL 4 MG PO TABS: 4.0000 mg | ORAL_TABLET | Freq: Four times a day (QID) | ORAL | 0 refills | Status: DC | PRN

## 2023-03-29 MED ORDER — SENNOSIDES-DOCUSATE SODIUM 8.6-50 MG PO TABS: 1.0000 | ORAL_TABLET | Freq: Two times a day (BID) | ORAL | 0 refills | Status: DC

## 2023-03-29 MED ORDER — OXYCODONE HCL 5 MG PO TABS: 5.0000 mg | ORAL_TABLET | Freq: Four times a day (QID) | ORAL | 0 refills | Status: DC | PRN

## 2023-03-29 MED ORDER — ASPIRIN 81 MG PO TBEC: 81.0000 mg | DELAYED_RELEASE_TABLET | Freq: Two times a day (BID) | ORAL | 12 refills | Status: DC

## 2023-03-29 MED ORDER — TRAMADOL HCL 50 MG PO TABS: 50.0000 mg | ORAL_TABLET | ORAL | 0 refills | Status: DC | PRN

## 2023-03-29 NOTE — Progress Notes (Signed)
Patient is not able to walk the distance required to go the bathroom, or he/she is unable to safely negotiate stairs required to access the bathroom.  A 3in1 BSC will alleviate this problem   Alexandria Moody P. Alexandria Moody, Jr. M.D.  

## 2023-03-29 NOTE — TOC Transition Note (Signed)
Transition of Care [facility]) - CM/SW Discharge Note   Patient Details  Name: Alexandria Moody MRN: [id number] Date of Birth: [birth year]  [facility] CM/SW Contact:  Garret Reddish, RN Phone Number: 03/29/2023, 11:40 AM   Clinical Narrative:   Chart reviewed.  Noted that patient will be a discharge for today. I have meet with patient at bedside today.  She reports that prior to admission she lived at home by herself.  Patient reports that she does have a Rolling walker, shower chair, BSC, and grab bars in her home.  Patient uses [facility] in [place] for medications.  Medications are affordable.  Patient's PCP is Cleon Dew.    I have spoken to patient about PT recommendation of Home Health.  Patient has no Home Health preference. I have asked [place] with [facility] to accept Home Health referral.    Patient's daughter will transport patient home today.  Patient has no other TOC needs identified.    Staff nurse made aware.       Final next level of care: Home w Home Health Services Barriers to Discharge: No Barriers Identified   Patient Goals and CMS Choice CMS Medicare.gov Compare Post Acute Care list provided to:: Patient Choice offered to / list presented to : Patient  Discharge Placement                    Name of family member notified: [name] (Patient's daughter at bedside) Patient and family notified of of transfer: 03/29/23  Discharge Plan and Services Additional resources added to the After Visit Summary for                            [facility] Arranged: PT [facility] Agency: [facility] Date [facility] Agency Contacted: 03/29/23 Time HH Agency Contacted: [phone] Representative spoke with at [facility] Agency: [place]  Social Determinants of Health (SDOH) Interventions SDOH Screenings   Food Insecurity: No Food Insecurity (03/28/2023)  Housing: Low Risk  (03/28/2023)  Transportation Needs: No Transportation Needs (03/28/2023)  Utilities: Not At Risk (03/28/2023)   Tobacco Use: Medium Risk (03/29/2023)     Readmission Risk Interventions     No data to display

## 2023-03-29 NOTE — Progress Notes (Signed)
[facility]  ROUNDING NOTE   Subjective:   Alexandria Moody is a 77 y.o. 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  Patient is known to our practice and receives peritoneal dialysis follow-up at [facility], supervised by Dr. Cherylann Ratel.   Patient seen ambulating in hall with physical therapy Daughter following closely PD treatment went well overnight Pain well-managed with prescribed medications Patient states she is ready for discharge   Objective:  Vital signs in last 24 hours:  Temp:  [97.7 F (36.5 C)-98.8 F (37.1 C)] 98.1 F (36.7 C) (04/11 0840) Pulse Rate:  [53-105] 77 (04/11 0840) Resp:  [10-19] 18 (04/11 0840) BP: (102-157)/(45-88) 113/54 (04/11 0840) SpO2:  [97 %-100 %] 100 % (04/11 0840) Weight:  [69.4 kg-70.6 kg] 69.4 kg (04/11 0715)  Weight change: 1.7 kg Filed Weights   03/28/23 0617 03/28/23 1822 03/29/23 0715  Weight: 68.9 kg 70.6 kg 69.4 kg    Intake/Output: I/O last 3 completed shifts: In: 1265 [I.V.:765; IV Piggyback:500] Out: 520 [Urine:350; Drains:95; Blood:75]   Intake/Output this shift:  Total I/O In: -  Out: 44 [Other:44]  Physical Exam: General: NAD  Head: Normocephalic, atraumatic. Moist oral mucosal membranes  Eyes: Anicteric  Lungs:  Clear to auscultation, normal effort  Heart: Regular rate and rhythm  Abdomen:  Soft, nontender, mild distention  Extremities: Trace peripheral edema.  Neurologic: Alert and oriented  moving all four extremities  Skin: No lesions  Access: PD catheter    Basic Metabolic Panel: Recent Labs  Lab 03/28/23 0625  NA 136  K 4.0  CL 98  GLUCOSE 120*  BUN 39*  CREATININE 3.20*     Liver Function Tests: No results for input(s): "AST", "ALT", "ALKPHOS", "BILITOT", "PROT", "ALBUMIN" in  the last 168 hours. No results for input(s): "LIPASE", "AMYLASE" in the last 168 hours. No results for input(s): "AMMONIA" in the last 168 hours.  CBC: Recent Labs  Lab 03/28/23 0625  HGB 12.6  HCT 37.0     Cardiac Enzymes: No results for input(s): "CKTOTAL", "CKMB", "CKMBINDEX", "TROPONINI" in the last 168 hours.  BNP: Invalid input(s): "POCBNP"  CBG: Recent Labs  Lab 03/28/23 1105 03/28/23 1310 03/28/23 1650 03/28/23 2218 03/29/23 0845  GLUCAP 198* 206* 272* 362* 290*     Microbiology: Results for orders placed or performed during the hospital encounter of 03/20/23  Surgical pcr screen     Status: None   Collection Time: 03/20/23  3:58 PM   Specimen: Nasal Mucosa; Nasal Swab  Result Value Ref Range Status   MRSA, PCR NEGATIVE NEGATIVE Final   Staphylococcus aureus NEGATIVE NEGATIVE Final    Comment: (NOTE) The Xpert SA Assay (FDA approved for NASAL specimens in patients 62 years of age and older), is one component of a comprehensive surveillance program. It is not intended to diagnose infection nor to guide or monitor treatment. Performed at [facility], [address] [place], [place] [address]     Coagulation Studies: No results for input(s): "LABPROT", "INR" in the last 72 hours.  Urinalysis: No results for input(s): "COLORURINE", "LABSPEC", "PHURINE", "GLUCOSEU", "HGBUR", "BILIRUBINUR", "KETONESUR", "PROTEINUR", "UROBILINOGEN", "NITRITE", "LEUKOCYTESUR" in the last 72 hours.  Invalid input(s): "APPERANCEUR"    Imaging: DG HIP UNILAT WITH PELVIS 2-3 VIEWS LEFT  Result Date: 03/28/2023 CLINICAL DATA:  Status post THA EXAM: DG HIP (WITH OR WITHOUT PELVIS) 2V LEFT COMPARISON:  None Available. FINDINGS: Two views status post left total hip arthroplasty demonstrating grossly anatomic alignment with no acute fracture. IMPRESSION: Unremarkable appearance status post recent left total hip arthroplasty. Electronically Signed   By: Layla Maw  M.D.   On: 03/28/2023 11:37     Medications:    sodium chloride 100 mL/hr at 03/28/23 1328   acetaminophen 1,000 mg (03/29/23 0554)   dialysis solution 1.5% low-MG/low-CA     dialysis solution 2.5% low-MG/low-CA      amLODipine  5 mg Oral QHS   aspirin  81 mg Oral BID   calcitRIOL  0.25 mcg Oral Daily   calcium citrate  1 tablet Oral Daily   carvedilol  6.25 mg Oral BID WC   cholecalciferol  1,000 Units Oral Daily   citalopram  20 mg Oral BH-q7a   cloNIDine  0.1 mg Oral Q lunch   ferrous sulfate  325 mg Oral BID WC   gentamicin cream  1 Application Topical Daily   hydroxychloroquine  200 mg Oral Daily   insulin aspart  0-5 Units Subcutaneous QHS   insulin aspart  0-6 Units Subcutaneous TID WC   insulin aspart  6 Units Subcutaneous Q supper   insulin glargine-yfgn  24 Units Subcutaneous q morning   isosorbide mononitrate  120 mg Oral QHS   levothyroxine  75 mcg Oral QAC breakfast   losartan  100 mg Oral Daily   magnesium hydroxide  30 mL Oral Daily   metoCLOPramide  10 mg Oral TID AC & HS   omega-3 acid ethyl esters  1 g Oral Daily   pantoprazole  40 mg Oral BID   ranolazine  500 mg Oral BID   rosuvastatin  20 mg Oral BH-q7a   senna-docusate  1 tablet Oral BID   torsemide  40 mg Oral BID   acetaminophen, albuterol, alum & mag hydroxide-simeth, bisacodyl, diphenhydrAMINE, HYDROmorphone (DILAUDID) injection, menthol-cetylpyridinium **OR** phenol, nitroGLYCERIN, ondansetron **OR** ondansetron (ZOFRAN) IV, oxyCODONE, oxyCODONE, sodium phosphate, traMADol  Assessment/ Plan:  [name] is a 77 y.o.  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   [facility] Lesia Sago CCPD 9 hours 4 exchanges 2.5 liter fills tidal 80% 69kg 6 days a week.  End Stage Renal Disease on peritoneal  dialysis. Resume home orders.  Peritoneal dialysis treatment received overnight, tolerated well.  UF 44 mL achieved.  Will defer discharge planning to surgical team.   Hypertension: home regimen of carvedilol, amlodipine, hydralazine, isosorbide mononitrate, losartan, and torsemide.  Blood pressure acceptable for this patient, 113/54.  Anemia with chronic kidney disease: Patient receives Mircera at outpatient clinic.    Secondary Hyperparathyroidism: not currently on binders.  Will continue to monitor bone minerals.  Diabetes mellitus type II with chronic kidney disease: insulin dependent. Hemoglobin A1c of 7.3% on 12/28/22. Complicated with diabetic retinopathy.   Glucose elevated at times, primary team to manage sliding scale insulin.  6.  Left total hip arthroplasty, orthopedics completed surgery on 03/28/2023.  Ambulating in hall with physical therapy.  Pain well-managed.   LOS: 0 Alexandria Moody 4/11/202410:18 AM

## 2023-03-29 NOTE — Progress Notes (Signed)
Peritoneal Dialysis  Post Treatment Note:  PD treatment completed. Patient tolerated treatment well.   PD effluent is yellow with some fibrins. PD exit site clean, dry and intact.   No specimen collected.  Patient is awake and alert and no acute distress.  Hand-off given to patient's nurse.   Total UF removed: 44 ml Post treatment weight: 69.4 kg   Ralene Muskrat RN Kidney D[facility]

## 2023-03-29 NOTE — Discharge Summary (Signed)
Physician Discharge Summary  Patient ID: Alexandria Moody MRN: [id number] DOB/AGE: 1946-11-12 77 y.o.  Admit date: 03/28/2023 Discharge date: 03/29/2023  Admission Diagnoses:  Status post total hip replacement, left [Z96.642]   Discharge Diagnoses: Patient Active Problem List   Diagnosis Date Noted   Status post total hip replacement, left 03/28/2023   Acute exacerbation of CHF (congestive heart failure) 01/25/2023   Insulin dependent type 2 diabetes mellitus 01/24/2023   CAP (community acquired pneumonia) 01/24/2023   Dyspnea 01/24/2023   ESRD on peritoneal dialysis 01/24/2023   Primary osteoarthritis of both hips 11/26/2022   Pain of left hip joint 03/09/2022   Nonexudative age-related macular degeneration, right eye, early dry stage 10/04/2021   Exudative age-related macular degeneration of left eye with active choroidal neovascularization 10/04/2021   Macrocytosis 05/10/2021   Stage 4 chronic kidney disease 05/10/2021   ANCA-associated vasculitis 04/05/2021   Osteoporosis, 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/2021   Secondary hyperparathyroidism, renal 03/24/2021   B12 deficiency 02/01/2021   Senile purpura 12/24/2020   Carotid atherosclerosis, bilateral 12/21/2020   Chronic diastolic congestive heart failure 12/21/2020   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 12/21/2020   GERD (gastroesophageal reflux disease) 12/21/2020   Hypothyroidism (acquired) 12/21/2020   Major depression in full remission 12/21/2020   Seborrheic dermatitis 12/21/2020   Sensorineural hearing loss (SNHL) of both ears 12/21/2020   Thyroid disease 12/21/2020   Vitamin D deficiency 12/21/2020   Postoperative follow-up 12/09/2020   Cystoid macular edema of right eye 11/16/2020   Macular pucker, right eye 10/12/2020   Atherosclerosis of abdominal aorta 09/10/2020   Bronchiectasis without complication 09/10/2020   High risk medication use 09/10/2020   Severe nonproliferative diabetic  retinopathy of left eye, with macular edema, associated with type 2 diabetes mellitus 09/07/2020   Cystoid macular edema of left eye 09/07/2020   History of vitrectomy 09/07/2020   Moderate nonproliferative diabetic retinopathy of right eye 09/07/2020   Both eyes affected by mild nonproliferative diabetic retinopathy with macular edema, associated with type 2 diabetes mellitus 09/07/2020   Essential hypertension 11/18/2019   Fatigue 11/18/2019   Stage 3b chronic kidney disease 11/18/2019   Non-rheumatic aortic sclerosis 07/22/2018   Adenomatous polyp of colon 10/10/2017   Chronic obstructive pulmonary disease 03/28/2017   Osteopenia of multiple sites 06/05/2016   PAD (peripheral artery disease) 08/17/2015   Atherosclerosis of native arteries of extremity with intermittent claudication 06/29/2015   Myalgia 06/29/2015   Stable angina 02/23/2015   TIA (transient ischemic attack) 01/18/2015   Anemia in chronic kidney disease 08/20/2014   Anemia in stage 4 chronic kidney disease 08/20/2014   Dizziness 07/24/2014   DOE (dyspnea on exertion) 07/24/2014   Calculus of gallbladder w/o mention of cholecystitis or obstruction 01/05/2014   Renal failure syndrome 11/19/2013   Contusion of right knee 11/10/2013   Facial contusion 11/10/2013   Fall 11/10/2013   Left elbow pain 11/10/2013   Left radial head fracture 11/10/2013   Right knee pain 11/10/2013   Occlusion and stenosis of carotid artery 06/23/2013   Stenosis of carotid artery 06/23/2013   Syncope 03/09/2013   Anemia of chronic disease 11/01/2012   Chronic kidney disease, stage 4 (severe) 09/11/2012   Stage 5 chronic kidney disease on chronic dialysis 09/11/2012   Anemia 06/24/2012   Type 2 diabetes mellitus 07/29/2011   Abnormal cardiovascular stress test 07/29/2011   Coronary artery disease involving native heart 07/29/2011   Hypertension associated with type 2 diabetes mellitus 07/29/2011   Hyperlipidemia associated with  type 2  diabetes mellitus 07/29/2011   Obesity (BMI 30-39.9) 07/29/2011   Situational stress 07/29/2011    Past Medical History:  Diagnosis Date   Adenomatous polyp of colon    Anemia of chronic renal failure    a.) recieving iron infusions + epoetin alfa-epbx (Retacrit)   Anginal pain    Aortic atherosclerosis    Arthritis    Atherosclerosis of native arteries of extremity with intermittent claudication    a.) ABI/TBI 06/24/2021: 0.95 RIGHT, 0.72 LEFT   Bilateral carotid artery disease 07/25/2021   a.) carotid doppler 07/25/2021: < 50% RICA, < 50% LICA.   Carotid atherosclerosis, bilateral    CHF (congestive heart failure)    a.) TTE 05/12/2020: LVEF 55-60%; mild LA enlargement; trivial to mild MR; G1DD; b.) TTE 01/25/2023: EF 55-60%, mild MR, G1DD.   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   a.) smolding phase ANCA associated pauci immune   Heart murmur    HLD (hyperlipidemia)    Hypertension    Hypothyroidism    Interstitial lung disease    Long term current use of immunosuppressive drug    a.) hydroxychloriquine for ANCA (+) glomerulonephritis   Lumbar degenerative disc disease    Macular degeneration    Osteoporosis    PAD (peripheral artery disease)    Peritoneal dialysis catheter in place 2023   Pneumonia 11/2021   S/P CABG x 3 2002   a.) LIMA-LAD, SVG-diagnoal, SVG-OM   Skin cancer    TIA (transient ischemic attack)    Type 2 diabetes mellitus with chronic kidney disease on chronic dialysis, with long-term current use of insulin    Vitamin D deficiency      Transfusion: none   Consultants (if any):   Discharged Condition: Improved  Hospital Course: Alexandria Moody is  an 77 y.o. female who was admitted 03/28/2023 with a diagnosis of Status post total hip replacement, left and went to the operating room on 03/28/2023 and underwent the above named procedures.    Surgeries: Procedure(s): TOTAL HIP ARTHROPLASTY on 03/28/2023 Patient tolerated the surgery well. Taken to PACU where she was stabilized and then transferred to the orthopedic floor.  Started on aspirin 81mg  BID, TEDs and SCDs applied bilaterally. Heels elevated on bed. No evidence of DVT. Negative Homan. Physical therapy started on day #1 for gait training and transfer. OT started day #1 for ADL and assisted devices. Patient received peritoneal dialysis over night for ESRD.  Patient's IV was d/c on day #1. Patient was able to safely and independently complete all PT goals. PT recommending discharge to home.  On post op day #1 patient was stable and ready for discharge to home with HHPT.  Implants: DePuy size 5 standard offset Actis femoral stem, 50 mm OD Pinnacle 100 acetabular component, +4 mm degree Pinnacle Marathon polyethylene insert, and a 32 mm CoCr +1 mm hip ball   She was given perioperative antibiotics:  Anti-infectives (From admission, onward)    Start     Dose/Rate Route Frequency Ordered Stop   03/29/23 0200  ceFAZolin (ANCEF) IVPB 2g/100 mL premix        2 g 200 mL/hr over 30 Minutes Intravenous Every 12 hours 03/28/23 1429  03/29/23 0325   03/28/23 1345  hydroxychloroquine (PLAQUENIL) tablet 200 mg        200 mg Oral Daily 03/28/23 1245     03/28/23 1330  ceFAZolin (ANCEF) IVPB 2g/100 mL premix  Status:  Discontinued        2 g 200 mL/hr over 30 Minutes Intravenous Every 6 hours 03/28/23 1245 03/28/23 1429   03/28/23 0600  ceFAZolin (ANCEF) IVPB 2g/100 mL premix        2 g 200 mL/hr over 30 Minutes Intravenous On call to O.R. 03/27/23 2209 03/28/23 0757     .  She was given sequential compression devices, early ambulation, and Aspirin TEDs for DVT prophylaxis.  She benefited  maximally from the hospital stay and there were no complications.    Recent vital signs:  Vitals:   03/29/23 0502 03/29/23 0840  BP: (!) 133/52 (!) 113/54  Pulse: 72 77  Resp: 16 18  Temp: 98.1 F (36.7 C) 98.1 F (36.7 C)  SpO2: 100% 100%    Recent laboratory studies:  Lab Results  Component Value Date   HGB 12.6 03/28/2023   HGB 11.8 (L) 03/20/2023   HGB 9.5 (L) 01/25/2023   Lab Results  Component Value Date   WBC 8.9 03/20/2023   PLT 276 03/20/2023   No results found for: "INR" Lab Results  Component Value Date   NA 136 03/28/2023   K 4.0 03/28/2023   CL 98 03/28/2023   CO2 28 03/20/2023   BUN 39 (H) 03/28/2023   CREATININE 3.20 (H) 03/28/2023   GLUCOSE 120 (H) 03/28/2023    Discharge Medications:   Allergies as of 03/29/2023       Reactions   Tape    Tears skin        Medication List     TAKE these medications    acetaminophen 650 MG CR tablet Commonly known as: TYLENOL Take 1,300 mg by mouth every 8 (eight) hours as needed for pain.   albuterol 108 (90 Base) MCG/ACT inhaler Commonly known as: VENTOLIN HFA Inhale 2 puffs into the lungs every 6 (six) hours as needed for wheezing or shortness of breath.   amLODipine 5 MG tablet Commonly known as: NORVASC Take 5 mg by mouth at bedtime.   aspirin EC 81 MG tablet Take 1 tablet (81 mg total) by mouth 2 (two) times daily. What changed: when to take this   calcitRIOL 0.25 MCG capsule Commonly known as: ROCALTROL Take 0.25 mcg by mouth daily.   Calcium Citrate + Tabs Take 1 tablet by mouth daily. 300 mg tablet   carvedilol 6.25 MG tablet Commonly known as: COREG Take 6.25 mg by mouth 2 (two) times daily with a meal.   cholecalciferol 25 MCG (1000 UT) tablet Generic drug: Cholecalciferol Take 1,000 Units by mouth daily.   citalopram 20 MG tablet Commonly known as: CELEXA Take 20 mg by mouth every morning.   cloNIDine 0.1 MG tablet Commonly known as: CATAPRES Take 0.1 mg by mouth daily  with lunch.   fluocinonide 0.05 % external solution Commonly known as: LIDEX Apply 1 application topically 2 (two) times daily as needed (scalp irritation).   hydroxychloroquine 200 MG tablet Commonly known as: PLAQUENIL Take 200 mg by mouth daily.   insulin aspart 100 UNIT/ML injection Commonly known as: novoLOG Inject 6 Units into the skin daily with supper.   isosorbide mononitrate 120 MG 24 hr tablet Commonly known as: IMDUR Take 120 mg by mouth at bedtime.  levothyroxine 75 MCG tablet Commonly known as: SYNTHROID Take 75 mcg by mouth daily before breakfast.   losartan 100 MG tablet Commonly known as: COZAAR Take 100 mg by mouth daily.   Melatonin 10 MG Tabs Take 1 tablet by mouth at bedtime as needed.   nitroGLYCERIN 0.4 MG SL tablet Commonly known as: NITROSTAT Place 0.4 mg under the tongue every 5 (five) minutes as needed for chest pain.   omega-3 acid ethyl esters 1 g capsule Commonly known as: LOVAZA Take 1 g by mouth daily.   omeprazole 20 MG capsule Commonly known as: PRILOSEC Take 20 mg by mouth every morning.   ondansetron 4 MG tablet Commonly known as: ZOFRAN Take 1 tablet (4 mg total) by mouth every 6 (six) hours as needed for nausea.   oxyCODONE 5 MG immediate release tablet Commonly known as: Oxy IR/ROXICODONE Take 1 tablet (5 mg total) by mouth every 6 (six) hours as needed for moderate pain (pain score 4-6).   ranolazine 500 MG 12 hr tablet Commonly known as: RANEXA Take 500 mg by mouth 2 (two) times daily.   rosuvastatin 20 MG tablet Commonly known as: CRESTOR Take 20 mg by mouth every morning.   senna-docusate 8.6-50 MG tablet Commonly known as: Senokot-S Take 1 tablet by mouth 2 (two) times daily.   torsemide 20 MG tablet Commonly known as: DEMADEX Take 40 mg by mouth 2 (two) times daily.   Toujeo SoloStar 300 UNIT/ML Solostar Pen Generic drug: insulin glargine (1 Unit Dial) Inject 24 Units into the skin every morning.    traMADol 50 MG tablet Commonly known as: ULTRAM Take 1-2 tablets (50-100 mg total) by mouth every 4 (four) hours as needed for moderate pain.               Durable Medical Equipment  (From admission, onward)           Start     Ordered   03/28/23 1246  DME Walker rolling  Once       Question:  Patient needs a walker to treat with the following condition  Answer:  S/P total hip arthroplasty   03/28/23 1245   03/28/23 1246  DME Bedside commode  Once       Comments: Patient is not able to walk the distance required to go the bathroom, or he/she is unable to safely negotiate stairs required to access the bathroom.  A 3in1 BSC will alleviate this problem  Question:  Patient needs a bedside commode to treat with the following condition  Answer:  S/P total hip arthroplasty   03/28/23 1245            Diagnostic Studies: DG HIP UNILAT WITH PELVIS 2-3 VIEWS LEFT  Result Date: 03/28/2023 CLINICAL DATA:  Status post THA EXAM: DG HIP (WITH OR WITHOUT PELVIS) 2V LEFT COMPARISON:  None Available. FINDINGS: Two views status post left total hip arthroplasty demonstrating grossly anatomic alignment with no acute fracture. IMPRESSION: Unremarkable appearance status post recent left total hip arthroplasty. Electronically Signed   By: Layla Maw M.D.   On: 03/28/2023 11:37    Disposition:      Follow-up Information     Hooten, Illene Labrador, MD Follow up on 05/10/2023.   Specialty: Orthopedic Surgery Why: at 2:00pm Contact information: [address] [facility] [address] [facility] [place] [address] [phone]                  Signed: Patience Musca 03/29/2023, 10:34 AM

## 2023-03-29 NOTE — Anesthesia Postprocedure Evaluation (Signed)
Anesthesia Post Note  Patient: Araceli Bouche  Procedure(s) Performed: TOTAL HIP ARTHROPLASTY (Left: Hip)  Patient location during evaluation: Nursing Unit Anesthesia Type: Spinal Level of consciousness: oriented and awake and alert Pain management: pain level controlled Vital Signs Assessment: post-procedure vital signs reviewed and stable Respiratory status: spontaneous breathing and respiratory function stable Cardiovascular status: blood pressure returned to baseline and stable Postop Assessment: no headache, no backache, no apparent nausea or vomiting, patient able to bend at knees and able to ambulate Anesthetic complications: no   No notable events documented.   Last Vitals:  Vitals:   03/28/23 2335 03/29/23 0502  BP: (!) 122/52 (!) 133/52  Pulse: 74 72  Resp: 16 16  Temp: 36.7 C 36.7 C  SpO2: 100% 100%    Last Pain:  Vitals:   03/29/23 0040  TempSrc:   PainSc: Asleep                 Lynden Oxford

## 2023-03-29 NOTE — Progress Notes (Signed)
Physical Therapy Treatment Patient Details Name: Alexandria Moody MRN: [id number] DOB: 01-21-1946 Today's Date: 03/29/2023   History of Present Illness Pt is a  77 y.o. female s/p L posterior THA.    PT Comments    Pt was sitting in recliner upon arrival. She is A and O x 4. Supportive daughter present throughout session. Author issued HEP and pt performed during session. Reviewed car transfers, hip precautions and what to expect from HHPT. Both pt/pt's daughter feel safe with Dcing home this date. Pt has all equipment needs met. She will benefit from continued skilled PT at DC to maximize independence and safety with all ADLs.     Recommendations for follow up therapy are one component of a multi-disciplinary discharge planning process, led by the attending physician.  Recommendations may be updated based on patient status, additional functional criteria and insurance authorization.     Assistance Recommended at Discharge Frequent or constant Supervision/Assistance  Patient can return home with the following A little help with walking and/or transfers;A little help with bathing/dressing/bathroom;Assistance with cooking/housework;Assist for transportation;Help with stairs or ramp for entrance   Equipment Recommendations  None recommended by PT       Precautions / Restrictions Precautions Precautions: Posterior Hip;Fall Precaution Booklet Issued: No Restrictions Weight Bearing Restrictions: Yes LLE Weight Bearing: Weight bearing as tolerated     Mobility  Bed Mobility  General bed mobility comments: In recliner pre/post session    Transfers Overall transfer level: Needs assistance Equipment used: Rolling walker (2 wheels) Transfers: Sit to/from Stand Sit to Stand: Supervision  General transfer comment: no physical assistance to stand from recliner or standard chair. Pt was educated on hip precautions and able to adhere throughout.    Ambulation/Gait Ambulation/Gait assistance:  Supervision Gait Distance (Feet): 120 Feet Assistive device: Rolling walker (2 wheels) Gait Pattern/deviations: Step-through pattern Gait velocity: WNL  General Gait Details: no LOB or safety concern during gait. Distance limited by fatigue   Stairs Stairs: Yes Stairs assistance: Supervision Stair Management: Two rails, Step to pattern, Forwards Number of Stairs: 4 General stair comments: pt demonstrates safe abilities to ascend/descend 4 stair with BUE support on +2 rails    Balance Overall balance assessment: Modified Independent       Cognition Arousal/Alertness: Awake/alert Behavior During Therapy: WFL for tasks assessed/performed Overall Cognitive Status: Within Functional Limits for tasks assessed    General Comments: Pt is A and O x 4. Supportive daughter present throughout session        Exercises Total Joint Exercises Ankle Circles/Pumps: AROM, Strengthening, Both, 10 reps, Seated Quad Sets: AROM, Strengthening, Supine, Left, 10 reps Gluteal Sets: AROM, Strengthening, Seated, Both, 10 reps Towel Squeeze: AROM, Strengthening, 10 reps Hip ABduction/ADduction: AROM, 10 reps        Pertinent Vitals/Pain Pain Assessment Pain Assessment: 0-10 Pain Score: 3  Pain Location: L hip/groin Pain Descriptors / Indicators: Aching, Sharp, Burning Pain Intervention(s): Limited activity within patient's tolerance, Monitored during session, Premedicated before session, Repositioned     PT Goals (current goals can now be found in the care plan section) Acute Rehab PT Goals Patient Stated Goal: To go home Progress towards PT goals: Progressing toward goals    Frequency    BID      PT Plan Current plan remains appropriate       AM-PAC PT "6 Clicks" Mobility   Outcome Measure  Help needed turning from your back to your side while in a flat bed without using bedrails?: A Little Help  needed moving from lying on your back to sitting on the side of a flat bed without  using bedrails?: A Little Help needed moving to and from a bed to a chair (including a wheelchair)?: A Little Help needed standing up from a chair using your arms (e.g., wheelchair or bedside chair)?: A Little Help needed to walk in hospital room?: A Little Help needed climbing 3-5 steps with a railing? : A Little 6 Click Score: 18    End of Session   Activity Tolerance: Patient tolerated treatment well Patient left: in chair;with call bell/phone within reach;with chair alarm set Nurse Communication: Mobility status PT Visit Diagnosis: Other abnormalities of gait and mobility (R26.89);Muscle weakness (generalized) (M62.81);Difficulty in walking, not elsewhere classified (R26.2);Pain Pain - Right/Left: Left Pain - part of body: Hip     Time: 3474-2595 PT Time Calculation (min) (ACUTE ONLY): 26 min  Charges:  $Gait Training: 8-22 mins $Therapeutic Exercise: 8-22 mins                     Jetta Lout PTA 03/29/23, 10:42 AM

## 2023-03-29 NOTE — Evaluation (Signed)
Occupational Therapy Evaluation Patient Details Name: Alexandria Moody MRN: [id number] DOB: 19-Sep-1946 Today's Date: 03/29/2023   History of Present Illness Pt is a  77 y.o. female s/p L posterior THA.   Clinical Impression   Pt seen for OT evaluation this date, POD#1 from above surgery. Pt was independent in all ADL prior to surgery, however occasionally using RW for mobility. Pt is eager to return to PLOF with less pain and improved safety and independence. Pt reports having a dog that she looks forward to seeing upon discharge. Also reports her daughter (present at eval) will be assisting her for a few days. Pt currently requires minimal assist for LB ADL while in seated position due to pain and limited AROM of L hip while maintaining posterior hip precautions. Pt/family educated in role of OT, falls prevention, posterior THPs and how to maintain during ADL/mobility, AE/DME for ADL, compression stocking mgt, and pet care considerations. Handout provided. Pt/family verbalized understanding. All OT education/training provided at time of evaluation, no additional OT follow up recommended at this time. Recommend intermittent assist upon initial return home.    Recommendations for follow up therapy are one component of a multi-disciplinary discharge planning process, led by the attending physician.  Recommendations may be updated based on patient status, additional functional criteria and insurance authorization.   Assistance Recommended at Discharge Intermittent Supervision/Assistance  Patient can return home with the following A little help with bathing/dressing/bathroom    Functional Status Assessment  Patient has had a recent decline in their functional status and demonstrates the ability to make significant improvements in function in a reasonable and predictable amount of time.  Equipment Recommendations  BSC/3in1;Tub/shower bench;Other (comment) (handheld showerhead)    Recommendations for  Other Services       Precautions / Restrictions Precautions Precautions: Posterior Hip;Fall Precaution Booklet Issued: Yes (comment) Restrictions Weight Bearing Restrictions: Yes LLE Weight Bearing: Weight bearing as tolerated      Mobility Bed Mobility               General bed mobility comments: declined, just returned to bed + pain    Transfers                   General transfer comment: declined, just returned to bed + pain      Balance                                           ADL either performed or assessed with clinical judgement   ADL                                         General ADL Comments: Pt requires MIN-MOD A for LB ADL tasks 2/2 posterior THPs, anticipate CGA for ADL transfers.     Vision         Perception     Praxis      Pertinent Vitals/Pain Pain Assessment Pain Assessment: 0-10 Pain Score: 7  Pain Location: L hip/groin with movement; 4/10 L knee Pain Descriptors / Indicators: Aching Pain Intervention(s): Limited activity within patient's tolerance, Monitored during session, Premedicated before session     Hand Dominance     Extremity/Trunk Assessment Upper Extremity Assessment Upper Extremity Assessment: Overall WFL for tasks assessed   Lower Extremity  Assessment Lower Extremity Assessment: Generalized weakness;LLE deficits/detail LLE Deficits / Details: L posterior THA LLE Sensation: WNL       Communication Communication Communication: No difficulties   Cognition Arousal/Alertness: Awake/alert Behavior During Therapy: WFL for tasks assessed/performed Overall Cognitive Status: Within Functional Limits for tasks assessed                                       General Comments       Exercises Other Exercises Other Exercises: Pt/family educated in role of OT, falls prevention, posterior THPs and how to maintain during ADL/mobility, AE/DME for ADL,  compression stocking mgt, and pet care considerations. Handout provided. Pt/family verbalized understanding.   Shoulder Instructions      Home Living Family/patient expects to be discharged to:: Private residence Living Arrangements: Alone Available Help at Discharge: Family;Available 24 hours/day Type of Home: Apartment Home Access: Level entry     Home Layout: One level     Bathroom Shower/Tub: Chief Strategy Officer: Standard Bathroom Accessibility: Yes   Home Equipment: Agricultural consultant (2 wheels);Rollator (4 wheels);BSC/3in1;Shower seat;Grab bars - tub/shower;Adaptive equipment Adaptive Equipment: Reacher;Long-handled sponge        Prior Functioning/Environment Prior Level of Function : Independent/Modified Independent;Driving             Mobility Comments: using RW over the last few months          OT Problem List: Decreased strength;Pain;Decreased range of motion      OT Treatment/Interventions:      OT Goals(Current goals can be found in the care plan section) Acute Rehab OT Goals Patient Stated Goal: be able to walk my dog again OT Goal Formulation: All assessment and education complete, DC therapy  OT Frequency:      Co-evaluation              AM-PAC OT "6 Clicks" Daily Activity     Outcome Measure Help from another person eating meals?: None Help from another person taking care of personal grooming?: None Help from another person toileting, which includes using toliet, bedpan, or urinal?: A Little Help from another person bathing (including washing, rinsing, drying)?: A Little Help from another person to put on and taking off regular upper body clothing?: None Help from another person to put on and taking off regular lower body clothing?: A Little 6 Click Score: 21   End of Session    Activity Tolerance: Patient tolerated treatment well Patient left: in bed;with call bell/phone within reach;with family/visitor present  OT  Visit Diagnosis: Other abnormalities of gait and mobility (R26.89);Pain Pain - Right/Left: Left Pain - part of body: Hip                Time: 7591-6384 OT Time Calculation (min): 33 min Charges:  OT General Charges $OT Visit: 1 Visit OT Evaluation $OT Eval Low Complexity: 1 Low OT Treatments $Self Care/Home Management : 23-37 mins  Arman Filter., MPH, MS, OTR/L ascom [phone] 03/29/23, 11:32 AM

## 2023-03-29 NOTE — Plan of Care (Signed)
  Problem: Fluid Volume: Goal: Ability to maintain a balanced intake and output will improve Outcome: Progressing   Problem: Metabolic: Goal: Ability to maintain appropriate glucose levels will improve Outcome: Progressing   Problem: Skin Integrity: Goal: Risk for impaired skin integrity will decrease Outcome: Progressing   Problem: Tissue Perfusion: Goal: Adequacy of tissue perfusion will improve Outcome: Progressing   Problem: Pain Management: Goal: Pain level will decrease with appropriate interventions Outcome: Progressing   Problem: Skin Integrity: Goal: Will show signs of wound healing Outcome: Progressing   Problem: Activity: Goal: Risk for activity intolerance will decrease Outcome: Progressing   Problem: Safety: Goal: Ability to remain free from injury will improve Outcome: Progressing

## 2023-03-29 NOTE — Progress Notes (Signed)
Subjective: 1 Day Post-Op Procedure(s) (LRB): TOTAL HIP ARTHROPLASTY (Left) Patient reports pain as mild.   Patient is well, and has had no acute complaints or problems Denies any CP, SOB, ABD pain. We will continue therapy today.  Plan is to go Home after hospital stay.  Objective: Vital signs in last 24 hours: Temp:  [97.7 F (36.5 C)-98.8 F (37.1 C)] 98.1 F (36.7 C) (04/11 0502) Pulse Rate:  [53-105] 72 (04/11 0502) Resp:  [10-19] 16 (04/11 0502) BP: (102-157)/(45-88) 133/52 (04/11 0502) SpO2:  [97 %-100 %] 100 % (04/11 0502) Weight:  [70.6 kg] 70.6 kg (04/10 1822)  Intake/Output from previous day: 04/10 0701 - 04/11 0700 In: 1265 [I.V.:765; IV Piggyback:500] Out: 520 [Urine:350; Drains:95; Blood:75] Intake/Output this shift: No intake/output data recorded.  Recent Labs    03/28/23 0625  HGB 12.6   Recent Labs    03/28/23 0625  HCT 37.0   Recent Labs    03/28/23 0625  NA 136  K 4.0  CL 98  BUN 39*  CREATININE 3.20*  GLUCOSE 120*   No results for input(s): "LABPT", "INR" in the last 72 hours.  EXAM General - Patient is Alert, Appropriate, and Oriented Extremity - Neurovascular intact Sensation intact distally Intact pulses distally Dorsiflexion/Plantar flexion intact No cellulitis present Compartment soft Dressing - dressing C/D/I and no drainage, hemovac removed Motor Function - intact, moving foot and toes well on exam.   Past Medical History:  Diagnosis Date   Adenomatous polyp of colon    Anemia of chronic renal failure    a.) recieving iron infusions + epoetin alfa-epbx (Retacrit)   Anginal pain    Aortic atherosclerosis    Arthritis    Atherosclerosis of native arteries of extremity with intermittent claudication    a.) ABI/TBI 06/24/2021: 0.95 RIGHT, 0.72 LEFT   Bilateral carotid artery disease 07/25/2021   a.) carotid doppler 07/25/2021: < 50% RICA, < 50% LICA.   Carotid atherosclerosis, bilateral    CHF (congestive heart  failure)    a.) TTE 05/12/2020: LVEF 55-60%; mild LA enlargement; trivial to mild MR; G1DD; b.) TTE 01/25/2023: EF 55-60%, mild MR, G1DD.   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   a.) smolding phase ANCA associated pauci immune   Heart murmur    HLD (hyperlipidemia)    Hypertension    Hypothyroidism    Interstitial lung disease    Long term current use of immunosuppressive drug    a.) hydroxychloriquine for ANCA (+) glomerulonephritis   Lumbar degenerative disc disease    Macular degeneration    Osteoporosis    PAD (peripheral artery disease)    Peritoneal dialysis catheter in place 2023   Pneumonia 11/2021   S/P CABG x 3 2002   a.) LIMA-LAD, SVG-diagnoal, SVG-OM   Skin cancer    TIA (transient ischemic attack)    Type 2 diabetes mellitus with chronic kidney disease on chronic dialysis, with long-term current use of insulin    Vitamin D deficiency     Assessment/Plan:   1 Day Post-Op Procedure(s) (LRB): TOTAL HIP ARTHROPLASTY (Left) Principal Problem:   Status post total hip replacement, left  Estimated body mass index is 27.57 kg/m as calculated  from the following:   Height as of this encounter: 5\' 3"  (1.6 m).   Weight as of this encounter: 70.6 kg. Advance diet Up with therapy Pain controlled Vital signs are stable Patient doing well with no complaints. CM to assist with discharge to home with HHPT today pending safe completion of PT goals  DVT Prophylaxis - Aspirin, TED hose, and SCDs Weight-Bearing as tolerated to left leg   T. Cranston Neighbor, PA-C [facility] 03/29/2023, 7:26 AM

## 2023-03-30 LAB — HEPATITIS B SURFACE ANTIBODY, QUANTITATIVE: Hep B S AB Quant (Post): 127 m[IU]/mL (ref >9.9)

## 2023-03-30 LAB — SURGICAL PATHOLOGY

## 2023-04-02 NOTE — Addendum Note (Signed)
Addendum  created 04/02/23 1831 by Yevette Edwards, MD   Attestation recorded in Intraprocedure, Intraprocedure Attestations filed

## 2023-04-24 DIAGNOSIS — R001 Bradycardia, unspecified: Secondary | ICD-10-CM | POA: Insufficient documentation

## 2023-06-01 ENCOUNTER — Encounter: Payer: Self-pay | Admitting: Orthopedic Surgery

## 2023-09-12 IMAGING — RF DG SWALLOWING FUNCTION
1 series · 1 of 1 positions shown · non-contrast
Comparison: None available

CLINICAL DATA: Dysphagia.

EXAM:
MODIFIED BARIUM SWALLOW
TECHNIQUE: Different consistencies of barium were administered orally to the
patient by the Speech Pathologist. Imaging of the pharynx was
performed in the lateral projection. The radiologist was present in
the fluoroscopy room for this study, providing personal supervision.
FLUOROSCOPY:
Radiation Exposure Index (as provided by the fluoroscopic device):
8.90 mGy Kerma

[Series 1: cp_standard · 0.08mm/px · 1 of 1 slices shown]
[im 1/1]
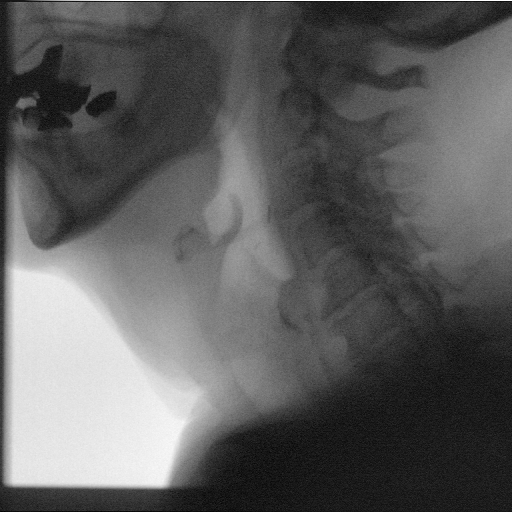

[1 of 1 positions shown; findings below may reference images not displayed]

FINDINGS: Vestibular  Penetration:  None seen.

Aspiration:  None seen.

Other: Cervical spine degenerative changes with small anterior
osteophytes and small cricopharyngeal bar.
IMPRESSION: Small cricopharyngeal bar otherwise unremarkable modified barium
swallow.

Please refer to the Speech Pathologists report for complete details
and recommendations.

## 2023-10-10 ENCOUNTER — Emergency Department: Payer: Medicare PPO

## 2023-10-10 ENCOUNTER — Encounter: Payer: Self-pay | Admitting: *Deleted

## 2023-10-10 ENCOUNTER — Encounter: Payer: Self-pay | Admitting: Oncology

## 2023-10-10 ENCOUNTER — Emergency Department
Admission: EM | Admit: 2023-10-10 | Discharge: 2023-10-11 | Disposition: A | Discharge status: Home / Self Care | Payer: Medicare PPO | Attending: Emergency Medicine | Admitting: Emergency Medicine

## 2023-10-10 ENCOUNTER — Other Ambulatory Visit: Payer: Self-pay

## 2023-10-10 DIAGNOSIS — S3991XA Unspecified injury of abdomen, initial encounter: Secondary | ICD-10-CM | POA: Diagnosis not present

## 2023-10-10 DIAGNOSIS — R079 Chest pain, unspecified: Secondary | ICD-10-CM | POA: Diagnosis present

## 2023-10-10 DIAGNOSIS — S0990XA Unspecified injury of head, initial encounter: Secondary | ICD-10-CM | POA: Diagnosis present

## 2023-10-10 DIAGNOSIS — S20219A Contusion of unspecified front wall of thorax, initial encounter: Secondary | ICD-10-CM | POA: Diagnosis not present

## 2023-10-10 DIAGNOSIS — S6992XA Unspecified injury of left wrist, hand and finger(s), initial encounter: Secondary | ICD-10-CM | POA: Insufficient documentation

## 2023-10-10 DIAGNOSIS — Z992 Dependence on renal dialysis: Secondary | ICD-10-CM | POA: Diagnosis not present

## 2023-10-10 DIAGNOSIS — S299XXA Unspecified injury of thorax, initial encounter: Secondary | ICD-10-CM

## 2023-10-10 DIAGNOSIS — N186 End stage renal disease: Secondary | ICD-10-CM | POA: Diagnosis not present

## 2023-10-10 DIAGNOSIS — Y92410 Unspecified street and highway as the place of occurrence of the external cause: Secondary | ICD-10-CM | POA: Insufficient documentation

## 2023-10-10 LAB — COMPREHENSIVE METABOLIC PANEL
ALT: 28 U/L (ref 0–44)
AST: 48 U/L — ABNORMAL HIGH (ref 15–41)
Albumin: 2.6 g/dL — ABNORMAL LOW (ref 3.5–5.0)
Alkaline Phosphatase: 96 U/L (ref 38–126)
Anion gap: 10 (ref 5–15)
BUN: 44 mg/dL — ABNORMAL HIGH (ref 8–23)
CO2: 27 mmol/L (ref 22–32)
Calcium: 8.6 mg/dL — ABNORMAL LOW (ref 8.9–10.3)
Chloride: 98 mmol/L (ref 98–111)
Creatinine, Ser: 3.06 mg/dL — ABNORMAL HIGH (ref 0.44–1.00)
GFR, Estimated: 15 mL/min — ABNORMAL LOW (ref >60)
Glucose, Bld: 152 mg/dL — ABNORMAL HIGH (ref 70–99)
Potassium: 3.8 mmol/L (ref 3.5–5.1)
Sodium: 135 mmol/L (ref 135–145)
Total Bilirubin: 0.6 mg/dL (ref 0.3–1.2)
Total Protein: 6.5 g/dL (ref 6.5–8.1)

## 2023-10-10 LAB — CBC WITH DIFFERENTIAL/PLATELET
Abs Immature Granulocytes: 0.1 10*3/uL — ABNORMAL HIGH (ref 0.00–0.07)
Basophils Absolute: 0 10*3/uL (ref 0.0–0.1)
Basophils Relative: 0 %
Eosinophils Absolute: 0.1 10*3/uL (ref 0.0–0.5)
Eosinophils Relative: 1 %
HCT: 32.9 % — ABNORMAL LOW (ref 36.0–46.0)
Hemoglobin: 10.8 g/dL — ABNORMAL LOW (ref 12.0–15.0)
Immature Granulocytes: 1 %
Lymphocytes Relative: 7 %
Lymphs Abs: 0.8 10*3/uL (ref 0.7–4.0)
MCH: 34.2 pg — ABNORMAL HIGH (ref 26.0–34.0)
MCHC: 32.8 g/dL (ref 30.0–36.0)
MCV: 104.1 fL — ABNORMAL HIGH (ref 80.0–100.0)
Monocytes Absolute: 0.6 10*3/uL (ref 0.1–1.0)
Monocytes Relative: 6 %
Neutro Abs: 8.6 10*3/uL — ABNORMAL HIGH (ref 1.7–7.7)
Neutrophils Relative %: 85 %
Platelets: 329 10*3/uL (ref 150–400)
RBC: 3.16 MIL/uL — ABNORMAL LOW (ref 3.87–5.11)
RDW: 13.1 % (ref 11.5–15.5)
WBC: 10.2 10*3/uL (ref 4.0–10.5)
nRBC: 0 % (ref 0.0–0.2)

## 2023-10-10 LAB — TROPONIN I (HIGH SENSITIVITY)
Troponin I (High Sensitivity): 22 ng/L — ABNORMAL HIGH (ref <18)
Troponin I (High Sensitivity): 32 ng/L — ABNORMAL HIGH (ref <18)

## 2023-10-10 LAB — CBG MONITORING, ED: Glucose-Capillary: 133 mg/dL — ABNORMAL HIGH (ref 70–99)

## 2023-10-10 MED ORDER — OXYCODONE HCL 5 MG PO TABS
5.0000 mg | ORAL_TABLET | Freq: Once | ORAL | Status: AC
  Administered 2023-10-10: 5 mg via ORAL
  Filled 2023-10-10: qty 1

## 2023-10-10 MED ORDER — ACETAMINOPHEN 500 MG PO TABS
1000.0000 mg | ORAL_TABLET | Freq: Once | ORAL | Status: AC
  Administered 2023-10-10: 1000 mg via ORAL
  Filled 2023-10-10: qty 2

## 2023-10-10 NOTE — ED Notes (Signed)
Assumed care of pt at this time. Pt is AAXO4, on CCM, VS stable and WNL. Provided pt with update on current treatment plan. Call light within reach, side rails up x2. No needs identified at this time. Pt accompanied by family member
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
## 2023-10-10 NOTE — ED Notes (Signed)
Pt requested blood sugar be checked.
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
## 2023-10-10 NOTE — ED Notes (Signed)
Pt at radiology  ?

## 2023-10-11 NOTE — Discharge Instructions (Signed)
Please follow-up with your primary care provider as needed.  You can take Tylenol at home as needed to help with pain symptoms.  Please return emerged part for any worsening symptoms

## 2023-10-11 NOTE — ED Notes (Signed)
Provided pt with discharge instructions and education. All of pt questions answered. Pt in possession of all belongings. Pt AAOX4 and stable at time of discharge.Pt wheeled to family vehicle safely.

## 2023-10-11 NOTE — ED Notes (Signed)
Provided the pt with meal tray and drink as requested.
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
## 2023-10-23 ENCOUNTER — Ambulatory Visit (INDEPENDENT_AMBULATORY_CARE_PROVIDER_SITE_OTHER): Payer: Medicare PPO | Admitting: Vascular Surgery

## 2023-10-23 ENCOUNTER — Encounter (INDEPENDENT_AMBULATORY_CARE_PROVIDER_SITE_OTHER): Payer: Medicare PPO

## 2023-11-10 NOTE — Discharge Instructions (Signed)
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 Angie Fava., M.D.    Dept. of Orthopaedics & Sports Medicine [facility] [address] [place], [place]  [address]  Phone: [phone]   Fax: [phone]        [email or web address]        DIET: Drink plenty of non-alcoholic fluids. Resume your normal diet. Include foods high in fiber.  ACTIVITY:  You may use crutches or a walker with weight-bearing as tolerated, unless instructed otherwise. You may be weaned off of the walker or crutches by your Physical Therapist.  Do NOT reach below the level of your knees or cross your legs until allowed.    Continue doing gentle exercises. Exercising will reduce the pain and swelling, increase motion, and prevent muscle weakness.   Please continue to use the TED compression stockings for 6 weeks. You may remove the stockings at night, but should reapply them in the morning. Do not drive or operate any equipment until instructed.  WOUND CARE:  Continue to use ice packs periodically to reduce pain and swelling. Keep the incision clean and dry. You may bathe or shower after the staples are removed at the first office visit following surgery.  MEDICATIONS: You may resume your regular medications. Please take the pain medication as prescribed on the medication. Do not take pain medication on an empty stomach. You have been given a prescription for a blood thinner to prevent blood clots. Please take the medication as instructed. (NOTE: After completing a 2 week course of Lovenox, take one Enteric-coated aspirin once a day.) Pain medications and iron supplements can cause constipation. Use a stool softener (Senokot or Colace) on a daily basis and a laxative (dulcolax or miralax) as needed. Do not drive or drink alcoholic beverages when taking pain medications.  CALL THE OFFICE FOR: Temperature above 101 degrees Excessive bleeding or drainage on the dressing. Excessive swelling,  coldness, or paleness of the toes. Persistent nausea and vomiting.  FOLLOW-UP:  You should have an appointment to return to the office in 6 weeks after surgery. Arrangements have been made for continuation of Physical Therapy (either home therapy or outpatient therapy).     [facility] Department Directory         [email or web address]       [email or web address]          Cardiology  Appointments: [place] - [phone]  Endocrinology  Appointments: [place] [phone] [place] - [phone]  Gastroenterology  Appointments: [place] [phone] [place] - [phone]        General Surgery   Appointments: [facility]  Internal Medicine/Family Medicine  Appointments: [facility] - [phone] [place] - [phone]  Metabolic and Weigh Loss Surgery  Appointments: [facility]        Neurology  Appointments: [place] [phone] [place] - [phone]  Neurosurgery  Appointments: [place]  Obstetrics & Gynecology  Appointments: [place] [phone] [place] - [phone]        Pediatrics  Appointments: [place] [phone] [place] - [phone]  Physiatry  Appointments: [place] [phone]  Physical Therapy  Appointments: [place] - [phone]        Podiatry  Appointments: [place] [phone] [place] - [phone]  Pulmonology  Appointments: [place]  Rheumatology  Appointments: [place] [phone]        [place] Location: [facility]  [address] [place], [place]  [address]  [place] Location: [facility] [address] [place], [place]  [address]  [place] Location: [facility] [address] [place], [place]  [address]

## 2023-11-13 ENCOUNTER — Encounter
Admission: RE | Admit: 2023-11-13 | Discharge: 2023-11-13 | Disposition: A | Discharge status: Home / Self Care | Payer: Medicare PPO | Source: Ambulatory Visit | Attending: Orthopedic Surgery | Admitting: Orthopedic Surgery

## 2023-11-13 ENCOUNTER — Other Ambulatory Visit: Payer: Self-pay

## 2023-11-13 DIAGNOSIS — Z992 Dependence on renal dialysis: Secondary | ICD-10-CM | POA: Diagnosis not present

## 2023-11-13 DIAGNOSIS — Z01812 Encounter for preprocedural laboratory examination: Secondary | ICD-10-CM

## 2023-11-13 DIAGNOSIS — E119 Type 2 diabetes mellitus without complications: Secondary | ICD-10-CM | POA: Insufficient documentation

## 2023-11-13 DIAGNOSIS — M1611 Unilateral primary osteoarthritis, right hip: Secondary | ICD-10-CM | POA: Insufficient documentation

## 2023-11-13 DIAGNOSIS — Z794 Long term (current) use of insulin: Secondary | ICD-10-CM | POA: Insufficient documentation

## 2023-11-13 DIAGNOSIS — E538 Deficiency of other specified B group vitamins: Secondary | ICD-10-CM | POA: Diagnosis not present

## 2023-11-13 DIAGNOSIS — N186 End stage renal disease: Secondary | ICD-10-CM | POA: Diagnosis not present

## 2023-11-13 DIAGNOSIS — Z01818 Encounter for other preprocedural examination: Secondary | ICD-10-CM | POA: Insufficient documentation

## 2023-11-13 HISTORY — DX: Squamous cell carcinoma of skin, unspecified: C44.92

## 2023-11-13 HISTORY — DX: Osteoarthritis of hip, unspecified: M16.9

## 2023-11-13 HISTORY — DX: Bradycardia, unspecified: R00.1

## 2023-11-13 LAB — COMPREHENSIVE METABOLIC PANEL
ALT: 20 U/L (ref 0–44)
AST: 24 U/L (ref 15–41)
Albumin: 3 g/dL — ABNORMAL LOW (ref 3.5–5.0)
Alkaline Phosphatase: 99 U/L (ref 38–126)
Anion gap: 10 (ref 5–15)
BUN: 51 mg/dL — ABNORMAL HIGH (ref 8–23)
CO2: 25 mmol/L (ref 22–32)
Calcium: 8.3 mg/dL — ABNORMAL LOW (ref 8.9–10.3)
Chloride: 99 mmol/L (ref 98–111)
Creatinine, Ser: 2.69 mg/dL — ABNORMAL HIGH (ref 0.44–1.00)
GFR, Estimated: 18 mL/min — ABNORMAL LOW (ref >60)
Glucose, Bld: 78 mg/dL (ref 70–99)
Potassium: 3.8 mmol/L (ref 3.5–5.1)
Sodium: 134 mmol/L — ABNORMAL LOW (ref 135–145)
Total Bilirubin: 0.6 mg/dL (ref <1.2)
Total Protein: 6.8 g/dL (ref 6.5–8.1)

## 2023-11-13 LAB — URINALYSIS, ROUTINE W REFLEX MICROSCOPIC
Bilirubin Urine: NEGATIVE
Glucose, UA: NEGATIVE mg/dL
Hgb urine dipstick: NEGATIVE
Ketones, ur: NEGATIVE mg/dL
Leukocytes,Ua: NEGATIVE
Nitrite: NEGATIVE
Protein, ur: NEGATIVE mg/dL
Specific Gravity, Urine: 1.014 (ref 1.005–1.030)
pH: 6 (ref 5.0–8.0)

## 2023-11-13 LAB — CBC
HCT: 31.9 % — ABNORMAL LOW (ref 36.0–46.0)
Hemoglobin: 11 g/dL — ABNORMAL LOW (ref 12.0–15.0)
MCH: 35 pg — ABNORMAL HIGH (ref 26.0–34.0)
MCHC: 34.5 g/dL (ref 30.0–36.0)
MCV: 101.6 fL — ABNORMAL HIGH (ref 80.0–100.0)
Platelets: 406 10*3/uL — ABNORMAL HIGH (ref 150–400)
RBC: 3.14 MIL/uL — ABNORMAL LOW (ref 3.87–5.11)
RDW: 13.4 % (ref 11.5–15.5)
WBC: 10.9 10*3/uL — ABNORMAL HIGH (ref 4.0–10.5)
nRBC: 0 % (ref 0.0–0.2)

## 2023-11-13 LAB — C-REACTIVE PROTEIN: CRP: 1.2 mg/dL — ABNORMAL HIGH (ref <1.0)

## 2023-11-13 LAB — SURGICAL PCR SCREEN
MRSA, PCR: NEGATIVE
Staphylococcus aureus: POSITIVE — AB

## 2023-11-13 LAB — SEDIMENTATION RATE: Sed Rate: 79 mm/h — ABNORMAL HIGH (ref 0–30)

## 2023-11-13 LAB — HEMOGLOBIN A1C
Hgb A1c MFr Bld: 7.3 % — ABNORMAL HIGH (ref 4.8–5.6)
Mean Plasma Glucose: 162.81 mg/dL

## 2023-11-13 LAB — TYPE AND SCREEN
ABO/RH(D): O POS
Antibody Screen: NEGATIVE

## 2023-11-13 NOTE — Patient Instructions (Addendum)
Your procedure is scheduled on: Wednesday, December 4 Report to the Registration Desk on the 1st floor of the [facility] To find out your arrival time, please call [phone] between 1PM - 3PM on: Tuesday, December 3 If your arrival time is 6:00 am, do not arrive before that time as the [facility] entrance doors do not open until 6:00 am.  REMEMBER: Instructions that are not followed completely may result in serious medical risk, up to and including death; or upon the discretion of your surgeon and anesthesiologist your surgery may need to be rescheduled.  Do not eat food after midnight the night before surgery.  No gum chewing or hard candies.  You may however, drink water up to 2 hours before you are scheduled to arrive for your surgery. Do not drink anything within 2 hours of your scheduled arrival time.  In addition, your doctor has ordered for you to drink the provided:  Gatorade G2 Drinking this carbohydrate drink up to two hours before surgery helps to reduce insulin resistance and improve patient outcomes. Please complete drinking 2 hours before scheduled arrival time.  One week prior to surgery: starting November 27 Stop Anti-inflammatories (NSAIDS) such as Advil, Aleve, Ibuprofen, Motrin, Naproxen, Naprosyn and Aspirin based products such as Excedrin, Goody's Powder, BC Powder. Stop ANY OVER THE COUNTER supplements until after surgery. Stop flaxseed, melatonin, multiple vitamins, calcium  You may however, continue to take Tylenol if needed for pain up until the day of surgery.  Continue taking all of your other prescription medications up until the day of surgery including the aspirin 81 mg.  ON THE DAY OF SURGERY ONLY TAKE THESE MEDICATIONS WITH SIPS OF WATER:  Citalopram (Celexa) Hydroxychloroquine Levothyroxine Omeprazole (Prilosec) Ranolazine (Ranexa) Rosuvastatin (Crestor)  Use albuterol inhaler on the day of surgery and bring your albuterol inhaler to the  hospital.  Do NOT take any insulin the morning of surgery.  No Alcohol for 24 hours before or after surgery.  No Smoking including e-cigarettes for 24 hours before surgery.  No chewable tobacco products for at least 6 hours before surgery.  No nicotine patches on the day of surgery.  Do not use any "recreational" drugs for at least a week (preferably 2 weeks) before your surgery.  Please be advised that the combination of cocaine and anesthesia may have negative outcomes, up to and including death. If you test positive for cocaine, your surgery will be cancelled.  On the morning of surgery brush your teeth with toothpaste and water, you may rinse your mouth with mouthwash if you wish. Do not swallow any toothpaste or mouthwash.  Use CHG Soap as directed on instruction sheet.  Do not wear jewelry, make-up, hairpins, clips or nail polish.  For welded (permanent) jewelry: bracelets, anklets, waist bands, etc.  Please have this removed prior to surgery.  If it is not removed, there is a chance that hospital personnel will need to cut it off on the day of surgery.  Do not wear lotions, powders, or perfumes.   Do not shave body hair from the neck down 48 hours before surgery.  Contact lenses, hearing aids and dentures may not be worn into surgery.  Do not bring valuables to the hospital. [facility] is not responsible for any missing/lost belongings or valuables.   Notify your doctor if there is any change in your medical condition (cold, fever, infection).  Wear comfortable clothing (specific to your surgery type) to the hospital.  After surgery, you can  help prevent lung complications by doing breathing exercises.  Take deep breaths and cough every 1-2 hours. Your doctor may order a device called an Incentive Spirometer to help you take deep breaths.  If you are being admitted to the hospital overnight, leave your suitcase in the car. After surgery it may be brought to your  room.  In case of increased patient census, it may be necessary for you, the patient, to continue your postoperative care in the Same Day Surgery department.  If you are being discharged the day of surgery, you will not be allowed to drive home. You will need a responsible individual to drive you home and stay with you for 24 hours after surgery.   If you are taking public transportation, you will need to have a responsible individual with you.  Please call the Pre-admissions Testing Dept. at [phone] if you have any questions about these instructions.  Surgery Visitation Policy:  Patients having surgery or a procedure may have two visitors.  Children under the age of 37 must have an adult with them who is not the patient.  Inpatient Visitation:    Visiting hours are 7 a.m. to 8 p.m. Up to four visitors are allowed at one time in a patient room. The visitors may rotate out with other people during the day.  One visitor age 36 or older may stay with the patient overnight and must be in the room by 8 p.m.    Pre-operative 5 CHG Bath Instructions   You can play a key role in reducing the risk of infection after surgery. Your skin needs to be as free of germs as possible. You can reduce the number of germs on your skin by washing with CHG (chlorhexidine gluconate) soap before surgery. CHG is an antiseptic soap that kills germs and continues to kill germs even after washing.   DO NOT use if you have an allergy to chlorhexidine/CHG or antibacterial soaps. If your skin becomes reddened or irritated, stop using the CHG and notify one of our RNs at [phone].   Please shower with the CHG soap starting 4 days before surgery using the following schedule:     Please keep in mind the following:  DO NOT shave, including legs and underarms, starting the day of your first shower.   You may shave your face at any point before/day of surgery.  Place clean sheets on your bed the day you  start using CHG soap. Use a clean washcloth (not used since being washed) for each shower. DO NOT sleep with pets once you start using the CHG.   CHG Shower Instructions:  If you choose to wash your hair and private area, wash first with your normal shampoo/soap.  After you use shampoo/soap, rinse your hair and body thoroughly to remove shampoo/soap residue.  Turn the water OFF and apply about 3 tablespoons (45 ml) of CHG soap to a CLEAN washcloth.  Apply CHG soap ONLY FROM YOUR NECK DOWN TO YOUR TOES (washing for 3-5 minutes)  DO NOT use CHG soap on face, private areas, open wounds, or sores.  Pay special attention to the area where your surgery is being performed.  If you are having back surgery, having someone wash your back for you may be helpful. Wait 2 minutes after CHG soap is applied, then you may rinse off the CHG soap.  Pat dry with a clean towel  Put on clean clothes/pajamas   If you choose to wear lotion,  please use ONLY the CHG-compatible lotions on the back of this paper.     Additional instructions for the day of surgery: DO NOT APPLY any lotions, deodorants, cologne, or perfumes.   Put on clean/comfortable clothes.  Brush your teeth.  Ask your nurse before applying any prescription medications to the skin.      CHG Compatible Lotions   Aveeno Moisturizing lotion  Cetaphil Moisturizing Cream  Cetaphil Moisturizing Lotion  Clairol Herbal Essence Moisturizing Lotion, Dry Skin  Clairol Herbal Essence Moisturizing Lotion, Extra Dry Skin  Clairol Herbal Essence Moisturizing Lotion, Normal Skin  Curel Age Defying Therapeutic Moisturizing Lotion with Alpha Hydroxy  Curel Extreme Care Body Lotion  Curel Soothing Hands Moisturizing Hand Lotion  Curel Therapeutic Moisturizing Cream, Fragrance-Free  Curel Therapeutic Moisturizing Lotion, Fragrance-Free  Curel Therapeutic Moisturizing Lotion, Original Formula  Eucerin Daily Replenishing Lotion  Eucerin Dry Skin Therapy  Plus Alpha Hydroxy Crme  Eucerin Dry Skin Therapy Plus Alpha Hydroxy Lotion  Eucerin Original Crme  Eucerin Original Lotion  Eucerin Plus Crme Eucerin Plus Lotion  Eucerin TriLipid Replenishing Lotion  Keri Anti-Bacterial Hand Lotion  Keri Deep Conditioning Original Lotion Dry Skin Formula Softly Scented  Keri Deep Conditioning Original Lotion, Fragrance Free Sensitive Skin Formula  Keri Lotion Fast Absorbing Fragrance Free Sensitive Skin Formula  Keri Lotion Fast Absorbing Softly Scented Dry Skin Formula  Keri Original Lotion  Keri Skin Renewal Lotion Keri Silky Smooth Lotion  Keri Silky Smooth Sensitive Skin Lotion  Nivea Body Creamy Conditioning Oil  Nivea Body Extra Enriched Lotion  Nivea Body Original Lotion  Nivea Body Sheer Moisturizing Lotion Nivea Crme  Nivea Skin Firming Lotion  NutraDerm 30 Skin Lotion  NutraDerm Skin Lotion  NutraDerm Therapeutic Skin Cream  NutraDerm Therapeutic Skin Lotion  ProShield Protective Hand Cream  Provon moisturizing lotion   Preoperative Educational Videos for Total Hip, Knee and Shoulder Replacements  To better prepare for surgery, please view our videos that explain the physical activity and discharge planning required to have the best surgical recovery at [facility].  [email or web address]  Questions? Call [phone] or email [email or web address]

## 2023-11-14 ENCOUNTER — Encounter: Payer: Self-pay | Admitting: Orthopedic Surgery

## 2023-11-14 NOTE — Progress Notes (Signed)
Perioperative / Anesthesia Services  Pre-Admission Testing Clinical Review / Preoperative Anesthesia Consult  Date: 11/20/23  Patient Demographics:  Name: Alexandria Moody DOB:   06/02/1946 MRN:   [id number]  Planned Surgical Procedure(s):    Case: [id number] Date/Time: 11/21/23 0815   Procedure: TOTAL HIP ARTHROPLASTY (Right: Hip)   Anesthesia type: Choice   Pre-op diagnosis: PRIMARY OSTEOARTHRITIS OF RIGHT HIP   Location: [facility] OR ROOM 01 / [facility] FOR ANEST[facility]   Surgeons: Donato Heinz, MD     NOTE: Available PAT nursing documentation and vital signs have been reviewed. Clinical nursing staff has updated patient's PMH/PSHx, current medication list, and drug allergies/intolerances to ensure comprehensive history available to assist in medical decision making as it pertains to the aforementioned surgical procedure and anticipated anesthetic course. Extensive review of available clinical information personally performed. [place] [facility] and PSHx updated with any diagnoses/procedures that  may have been inadvertently omitted during her intake with the pre-admission testing department's nursing staff.  Clinical Discussion:  Alexandria Moody is a 77 y.o. female who is submitted for pre-surgical anesthesia review and clearance prior to her undergoing the above procedure. Patient is a Former Smoker (50 pack years; quit 12/1998). 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   Patient is followed by cardiology Juliann Pares, MD). She was last seen in the cardiology clinic on 07/25/2023.; notes reviewed. At the time of her clinic visit, patient doing well overall  from a cardiovascular perspective. Patient denied any chest pain, shortness of breath, PND, orthopnea, palpitations, significant peripheral edema, weakness, fatigue, vertiginous symptoms, or presyncope/syncope. Patient with a past medical history significant for cardiovascular diagnoses. Documented physical exam was grossly benign, providing no evidence of acute exacerbation and/or decompensation of the patient's known cardiovascular conditions.  Patient underwent a three-vessel CABG procedure in 2002.  LIMA-LAD, SVG-diagonal, SVG-OM bypass grafts were placed.    Patient reported to have suffered a TIA in 2019.  She denies any residual effects from this neurological event.  Myocardial perfusion imaging study performed on 02/21/2021 revealed a mildly reduced left ventricular systolic function with an EF of 48%.  There was a moderate mixed perfusion abnormality present in the posterior lateral myocardial region on stress images. Resting images showed minimal perfusion abnormality of mild intensity in the posterior lateral myocardium consistent with previous myocardial infarction and/or scar as well as reversibility of ischemia.  Multiple cardiac event monitor study performed on 04/24/2023 due to reports of symptomatic bradycardia with heart rates in the 40s.  Study revealed underlying sinus rhythm with rates between 52 and 102 bpm.  There was no significant atrial or ventricular ectopy.  No sustained pauses or arrhythmias noted.  No significant patient triggered events.  Most recent TTE performed on 01/25/2023 revealed a normal left ventricular systolic function with an EF of 55-60%. There were no segmental wall motion abnormalities noted.  Diastolic Doppler parameters consistent with impaired relaxation (G1DD).  There was mild mitral valve regurgitation. All transvalvular gradients were noted to be normal providing no evidence suggestive of valvular stenosis. Aorta normal in size with no evidence of  aneurysmal dilatation.   Blood pressure well controlled at 120/60 mmHg on currently prescribed CCB (amlodipine), diuretic (torsemide), nitrate (isosorbide mononitrate), and ARB (losartan) therapies. Patient is  on rosuvastatin for her HLD diagnosis and ASCVD prevention. Patient is on scheduled ranolazine for angina/anginal equivalent symptom prevention..  Additionally, she has a supply of short acting nitrates (NTG) to use on a as needed basis; denied recent use. T2DM adequately controlled on currently prescribed regimen; last HgbA1c was 6.7% when checked on 07/12/2023. She does not have an OSAH diagnosis. Patient makes an effort to maintain an active lifestyle.  She exercises regularly. Patient is able to complete all of her ADL/IADLs without cardiovascular limitation. Per the DASI, patient is able to achieve at least 4 METS of physical activity without experiencing any significant degree of angina/anginal equivalent symptoms.No changes were made to her medication regimen during her visit with cardiology.  Patient scheduled to follow-up with outpatient cardiology in 6 months or sooner if needed.  Alexandria Moody is scheduled for an elective TOTAL HIP ARTHROPLASTY (Right: Hip) on 11/21/2023 with Dr. Francesco Sor, MD.  Given patient's past medical history significant for cardiovascular diagnoses, presurgical cardiac clearance was sought by the PAT team. Per cardiology, "this patient is optimized for surgery and may proceed with the planned procedural course with a LOW risk of significant perioperative cardiovascular complications".  In review of her medication reconciliation, it is noted that patient is currently on prescribed daily antithrombotic therapy.  The patient's cardiovascular history, orthopedics has cleared patient to continue her daily low-dose ASA throughout her perioperative course.  Patient has been updated on these instructions from her surgeon.  Patient denies previous perioperative complications  with anesthesia in the past. In review of the available records, it is noted that patient underwent a general anesthetic course here at [facility] (ASA III) in 03/2023 without documented complications.      11/17/2023    9:53 AM 11/17/2023    9:52 AM 11/13/2023    3:18 PM  Vitals with BMI  Height  5\' 3"  5\' 3"   Weight  149 lbs 149 lbs  BMI  26.4 26.4  Systolic 146  127  Diastolic 77  69  Pulse 79  68    Providers/Specialists:   NOTE: Primary physician provider listed below. Patient may have been seen by APP or partner within same practice.   PROVIDER ROLE / SPECIALTY LAST OV  Hooten, Illene Labrador, MD Orthopedics (Surgeon) 11/06/2023  Cleon Dew, FNP Primary Care Provider 10/31/2023  Rudean Hitt, MD Cardiology 07/25/2023  Mady Haagensen, MD Nephrology 03/29/2023; seen while inpatient  Gerrie Nordmann, MD Rheumatology 10/02/2023  Rickard Patience, MD Hematology/Oncology 02/10/2022  Wendall Mola, MD Endocrinology 07/19/2023  Vida Rigger, MD Pulmonary Medicine 04/16/2023   Allergies:  Tape  Current Home Medications:   No current facility-administered medications for this encounter.    acetaminophen (TYLENOL) 650 MG CR tablet   albuterol (VENTOLIN HFA) 108 (90 Base) MCG/ACT inhaler   amLODipine (NORVASC) 5 MG tablet   aspirin EC 81 MG tablet   calcitRIOL (ROCALTROL) 0.25 MCG capsule   cholecalciferol 25 MCG (1000 UT) tablet   citalopram (CELEXA) 20 MG tablet   denosumab (PROLIA) 60 MG/ML SOSY injection   DENTA 5000 PLUS 1.1 % CREA dental cream   Flaxseed, Linseed, (FLAXSEED OIL) 1400 MG CAPS   hydroxychloroquine (PLAQUENIL) 200 MG tablet   insulin glargine, 1 Unit Dial, (TOUJEO SOLOSTAR) 300 UNIT/ML Solostar Pen   isosorbide mononitrate (IMDUR) 120 MG 24 hr tablet   levothyroxine (SYNTHROID) 75 MCG tablet   losartan (COZAAR) 100 MG tablet   Melatonin 10 MG TABS   Multiple Minerals-Vitamins (CALCIUM CITRATE +)  TABS   multivitamin  (RENA-VIT) TABS tablet   nitroGLYCERIN (NITROSTAT) 0.4 MG SL tablet   omeprazole (PRILOSEC) 20 MG capsule   ranolazine (RANEXA) 500 MG 12 hr tablet   rosuvastatin (CRESTOR) 10 MG tablet   torsemide (DEMADEX) 20 MG tablet   cefdinir (OMNICEF) 300 MG capsule   polyethylene glycol powder (GLYCOLAX/MIRALAX) 17 GM/SCOOP powder   History:   Past Medical History:  Diagnosis Date   Adenomatous polyp of colon    Anemia of chronic renal failure    a.) recieving iron infusions + epoetin alfa-epbx (Retacrit)   Anginal pain (HCC)    Aortic atherosclerosis (HCC)    Arthritis    Atherosclerosis of native arteries of extremity with intermittent claudication (HCC)    a.) ABI/TBI 06/24/2021: 0.95 RIGHT, 0.72 LEFT   Beta-blockers contraindicated    a.) result in significant bradycardia; previously on carvedilol as Rx'd by cardiology -- disontinued due to symptomatic bradycardia   Bilateral carotid artery disease (HCC) 07/25/2021   a.) carotid doppler 07/25/2021: < 50% RICA, < 50% LICA.   Bradycardia    CHF (congestive heart failure) (HCC)    a.) TTE 05/12/2020: LVEF 55-60%; mild LA enlargement; trivial to mild MR; G1DD; b.) TTE 01/25/2023: EF 55-60%, mild MR, G1DD.   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   a.) smolding phase ANCA associated pauci immune   Heart murmur    HLD (hyperlipidemia)    Hypertension    Hypothyroidism    Insomnia    a.) uses melatonin PRN   Interstitial lung disease (HCC)    Long term current use of immunosuppressive drug    a.)  hydroxychloriquine for ANCA (+) glomerulonephritis   Long-term use of aspirin therapy    Macular degeneration    MDD (major depressive disorder)    Osteoporosis    a.) on denosumab injections   PAD (peripheral artery disease) (HCC)    Pneumonia 11/2021   S/P CABG x 3 2002   a.) LIMA-LAD, SVG-diagnoal, SVG-OM   Seborrheic dermatitis    SNHL (sensorineural hearing loss)    Squamous cell skin cancer    legs, hands, scalp   TIA (transient ischemic attack) 2019   Type 2 diabetes mellitus with chronic kidney disease on chronic dialysis, with long-term current use of insulin (HCC)    Vitamin D deficiency    Past Surgical History:  Procedure Laterality Date   ABDOMINAL HYSTERECTOMY  1971   CAPD INSERTION N/A 02/03/2022   Procedure: LAPAROSCOPIC INSERTION CONTINUOUS AMBULATORY PERITONEAL DIALYSIS  (CAPD) CATHETER;  Surgeon: Leafy Ro, MD;  Location: [facility] ORS;  Service: General;  Laterality: N/A;  Provider requesting 1.5 hours / 90 minutes for procedure.   CARDIAC CATHETERIZATION     CAROTID ENDARTERECTOMY Left    CAROTID ENDARTERECTOMY Right    CATARACT EXTRACTION Bilateral 2014   CHOLECYSTECTOMY  2020   COLONOSCOPY     CORONARY ARTERY BYPASS GRAFT N/A 01/2001   Procedure: 3v CORONARY ARTERY ARTERY BYPASS GRAFT   EYE SURGERY Left 07/22/2020   Dr. Kristine Royal, [facility]   TONSILLECTOMY  1953   TOTAL HIP ARTHROPLASTY Left 03/28/2023   Procedure: TOTAL HIP ARTHROPLASTY;  Surgeon: Donato Heinz, MD;  Location: [facility] ORS;  Service: Orthopedics;  Laterality: Left;   No family history on file. Social History   Tobacco Use   Smoking status: Former    Current packs/day: 0.00    Average packs/day: 2.0 packs/day for 25.0 years (50.0 ttl pk-yrs)    Types: Cigarettes    Start date: 89    Quit date: 2000    Years since quitting: 24.9    Passive exposure: Past   Smokeless tobacco: Never  Vaping Use   Vaping status: Never Used  Substance Use Topics   Alcohol use: Never   Drug  use: Never    Pertinent Clinical Results:  LABS:   No visits with results within 3 Day(s) from this visit.  Latest known visit with results is:  Admission on 11/17/2023, Discharged on 11/17/2023  Component Date Value Ref Range Status   Specimen Source 11/17/2023 URINE, CLEAN CATCH   Final   Color, Urine 11/17/2023 AMBER (A)  YELLOW Final   BIOCHEMICALS MAY BE AFFECTED BY COLOR   APPearance 11/17/2023 HAZY (A)  CLEAR Final   Specific Gravity, Urine 11/17/2023 1.015  1.005 - 1.030 Final   pH 11/17/2023 5.5  5.0 - 8.0 Final   Glucose, UA 11/17/2023 100 (A)  NEGATIVE mg/dL Final   Hgb urine dipstick 11/17/2023 NEGATIVE  NEGATIVE Final   Bilirubin Urine 11/17/2023 SMALL (A)  NEGATIVE Final   Ketones, ur 11/17/2023 NEGATIVE  NEGATIVE mg/dL Final   Protein, ur 40/98/1191 100 (A)  NEGATIVE mg/dL Final   Nitrite 47/82/9562 POSITIVE (A)  NEGATIVE Final   Leukocytes,Ua 11/17/2023 SMALL (A)  NEGATIVE Final   Squamous Epithelial / HPF 11/17/2023 0-5  0 - 5 /HPF Final   WBC, UA 11/17/2023 >50  0 - 5 WBC/hpf Final   Reflex urine culture not performed if WBC <=10, OR if Squamous epithelial cells >5. If Squamous epithelial cells >5, suggest recollection.   RBC / HPF 11/17/2023 NONE SEEN  0 - 5 RBC/hpf Final   Bacteria, UA 11/17/2023 MANY (A)  NONE SEEN Final   WBC Clumps 11/17/2023 PRESENT   Final   Performed at [facility], [address] [place], [place] [address]   Specimen Description 11/17/2023    Final                   Value:URINE, RANDOM Performed at [facility], [address], [place], [place] [address]    Special Requests 11/17/2023    Final                   Value:NONE Reflexed from [id number] Performed at [facility], [address] [place], [place] [address]    Culture 11/17/2023 40,000 COLONIES/mL ESCHERICHIA COLI (A)   Final   Report Status 11/17/2023 11/19/2023 FINAL   Final   Organism ID, Bacteria 11/17/2023 ESCHERICHIA COLI (A)   Final     ECG: Date: 10/10/2023 Time ECG obtained: 1850 PM Rate: 80 bpm Rhythm: normal sinus Axis (leads I and aVF): Normal Intervals: PR 204 ms. QRS 106 ms. QTc 463 ms. ST segment and T wave changes: No evidence of acute ST segment elevation or depression. Evidence of a possible age undetermined anterior infarct.  Comparison: Similar to previous tracing obtained on 01/24/2023   IMAGING / PROCEDURES: CT CHEST ABDOMEN PELVIS WO CONTRAST performed  on 10/10/2023 Emphysematous changes in the lungs. Peripheral fibrosis and scattered honeycomb changes in the lungs likely representing usual interstitial pneumonitis Diffuse aortic atherosclerosis. Anterior compression of the L1 vertebra appears chronic. No acute posttraumatic changes demonstrated in the chest, abdomen, or pelvis as visualized.  CT CERVICAL SPINE WO CONTRAST performed on 10/10/2023 Degenerative changes throughout the cervical spine with disc space narrowing and endplate osteophyte formation.  Degenerative changes in the posterior facet joints. Normal alignment of the cervical spine.  No acute displaced fractures identified.  TRANSTHORACIC ECHOCARDIOGRAM performed on 01/25/2023 Left ventricular ejection fraction, by estimation, is 55 to 60%. The left ventricle has normal function. The left ventricle has no regional wall motion abnormalities. Left ventricular diastolic parameters are consistent with Grade I diastolic  dysfunction (impaired relaxation).  Right ventricular systolic function is normal. The right ventricular size is normal.  The mitral valve is normal in structure. Mild mitral valve regurgitation.  The aortic valve is normal in structure. Aortic valve regurgitation is not visualized.   DIAGNOSTIC RADIOGRAPHS OF BILATERAL HIPS 2 VIEWS WITHOUT OR WITHOUT PELVIS performed on 11/23/2022 Radiographs of the RIGHT hip demonstrate moderate narrowing of the cartilage space especially superiorly.  Subchondral sclerosis is noted.   Radiographs of the LEFT hip demonstrate significant narrowing of the cartilage space with bone-on-bone articulation.  There is some mild flattening of the superior aspect of the femoral head.  Subchondral sclerosis is noted. Osteophyte formation is present.  Also of note is significant vascular calcification extending down both thighs.   MYOCARDIAL PERFUSION IMAGING STUDY (LEXISCAN) performed on 10/12/2022 LVEF 47%  Normal myocardial thickening and wall motion No artifacts noted Left ventricular cavity size normal SPECT images demonstrate moderate mixed perfusion abnormality of moderate intensity present posterior lateral myocardial region on stress images.  Resting images showed minimal perfusion abnormality of mild intensity in the posterior lateral myocardium consistent with possible previous infarct and/or scar as well as reversibility and ischemia.  BILATERAL CAROTID DOPPLER performed on 08/31/2022 Mild calcified plaque at the level of the proximal right ICA. Estimated right ICA stenosis is less than 50%. Right vertebral artery shows antegrade flow with normal waveform and velocity. Moderate calcified plaque at the level of the distal common carotid artery and mild calcified plaque at the level of the carotid bulb and proximal left ICA. Estimated left ICA stenosis is less than 50%. Left vertebral artery shows antegrade flow with normal waveform and velocity.   VASCULAR ULTRASOUND ABI WITH/WITHOUT TBI performed on 06/24/2021 Right: Resting right ankle-brachial index is within normal range. Evidence of mild tibial level arterial disease of right lower extremity. Mildly abnormal left TBI.  Left: Resting left ankle-brachial index indicates moderate left lower extremity arterial disease. The left toe-brachial index is abnormal.     Impression and Plan:  Alexandria Moody has been referred for pre-anesthesia review and clearance prior to her undergoing the planned anesthetic and procedural courses.  Available labs, pertinent testing, and imaging results were personally reviewed by me in preparation for upcoming operative/procedural course. [facility] medical record has been updated following extensive record review and patient interview with PAT staff.   This patient has been appropriately cleared by cardiology with an overall LOW risk of significant perioperative cardiovascular complications. Based on clinical review performed today (11/20/23), barring any significant acute changes in the patient's overall condition, it is anticipated that she will be able to proceed with the planned surgical intervention. Any acute changes in clinical condition may necessitate her procedure being postponed and/or cancelled. Patient will  meet with anesthesia team (MD and/or CRNA) on the day of her procedure for preoperative evaluation/assessment. Questions regarding anesthetic course will be fielded at that time.   Pre-surgical instructions were reviewed with the patient during her PAT appointment, and questions were fielded to satisfaction by PAT clinical staff. She has been instructed on which medications that she will need to hold prior to surgery, as well as the ones that have been deemed safe/appropriate to take of the day of her procedure. As part of the general education provided by PAT, patient made aware both verbally and in writing, that she would need to abstain from the use of any illegal substances during her perioperative course.  She was advised that failure to follow the provided instructions could necessitate case cancellation or result serious perioperative complications up to and including death. Patient encouraged to contact PAT and/or her surgeon's office to discuss any questions or concerns that may arise prior to surgery; verbalized understanding.   Quentin Mulling, MSN, APRN, FNP-C, CEN [facility]  Peri-operative Services Nurse Practitioner Phone: [phone] Fax: [phone] 11/20/23 11:24 AM  NOTE: This note has been prepared using Dragon dictation software. Despite my best ability to proofread, there is always the potential that unintentional transcriptional errors may still occur from this process.

## 2023-11-15 NOTE — H&P (Signed)
ORTHOPAEDIC HISTORY & PHYSICAL Raenette Rover, [place] - 11/06/2023 3:15 PM EST Formatting of this note is different from the original. NAME: Araceli Bouche H&P Date: 11/06/2023 Procedure Date: 11/21/2023  Chief Complaint: right hip pain  HPI Ponda Windish is a 77 y.o. female who has severe right hip pain. Patient has had many years of discomfort of both her left and right hip. Patient does however have a history of a left total hip arthroplasty that Dr. Ernest Pine performed a little over 7 months ago. She states that that having that hip replaced was "wonderful." She is looking to have her right hip replaced. She states that the pain will localize into the front and lateral aspect of her right hip when she has up, walking or prolonged standing. She states that it has limited her ability to perform her ADLs and ambulate long distances. She is currently utilizing a four-wheel walker to help with ambulation assistance. She has failed conservative treatment including Tylenol, Plaquenil, activity modifications and use of a four-wheel walker . 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 Of note, patient had 2 concerns when coming into clinic today. She states that last Sunday she became a little dizzy and fell over landing on her left arm. She did not have any fractures or increased discomfort, however had a small skin abrasion over her left elbow about the size of a half dollar. She denies any issues with this area, states that she has been keeping it dry, covered and using topical antibiotics. Patient also states that she had 2 skin biopsies performed on her lower extremities within the past 5 to 6 months that have been healing well, she has been keeping them covered without having any issues, skin changes or infectious properties. Denies having any skin breakdown or changes along her right hip area. Patient denies any pulmonary history or history of DVTs or clots. She does state that  she has had open heart and vascular surgery of her carotids to help with cholesterol and plaque buildup. 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 She is a diabetic, her last A1c was 6.7.  Social Hx: Patient is retired. She lives at home by herself, but states that she has her daughter that lives around the area that will be staying to look after her. She denies any alcohol, tobacco or illicit drug use.  Medications & Allergies Allergies: Allergies Allergen Reactions Adhesive Tape-Silicones Other (See Comments) Tears skin  Home Medicines: Current Outpatient Medications on File Prior to Visit Medication Sig Dispense Refill acetaminophen (TYLENOL) 500 MG tablet Take 1,000 mg by mouth every 6 (six) hours as needed for Pain albuterol MDI, PROVENTIL, VENTOLIN, PROAIR, HFA 90 mcg/actuation inhaler Inhale into the lungs every 6 (six) hours as needed amLODIPine (NORVASC) 5 MG tablet Take 5 mg by mouth once daily ammonium lactate (LAC-HYDRIN) 12 % lotion Apply topically 2 (two) times daily 400 g 0 aspirin 81 MG EC tablet Take 81 mg by mouth once daily calcitRIOL (ROCALTROL) 0.25 MCG capsule Take 0.25 mcg by mouth once daily CALCIUM CITRATE ORAL Take 600 mg by mouth once daily cholecalciferol 1000 unit tablet Take 1,000 Units by mouth once daily citalopram (CELEXA) 20 MG tablet Take 20 mg by mouth once daily CONCENTRATED insulin glargine (TOUJEO SOLOSTAR U-300 INSULIN) pen injector (concentration 300 units/mL) Inject 18 Units subcutaneously every morning or as directed denosumab (PROLIA SUBQ) Inject subcutaneously every 6 (six) months docosahexaenoic acid-epa 120-180 mg Cap Take 1,000 mg by  mouth once daily FISH OIL fluorouraciL (EFUDEX) 5 % cream APPLY TWO TIMES/DAY TO ACTINIC KERATOSES ON THE FRONTAL SCALP FOR 3 WEEKS. WASH OFF IN AM. fluticasone furoate-vilanteroL 200-25 mcg/dose DsDv Inhale 1 Puff into the lungs once daily hydroxychloroquine (PLAQUENIL) 200 mg tablet Take  1 tablet (200 mg total) by mouth once daily 90 tablet 1 isosorbide mononitrate (IMDUR) 120 MG ER tablet TAKE 1 TABLET (120 MG TOTAL) BY MOUTH NIGHTLY 90 tablet 3 levothyroxine (SYNTHROID) 75 MCG tablet Take 1 tablet (75 mcg total) by mouth every morning before breakfast 90 tablet 3 losartan (COZAAR) 100 MG tablet Take 100 mg by mouth once daily losartan (COZAAR) 25 MG tablet Take 50 mg by mouth 2 (two) times daily nitroGLYcerin (NITROSTAT) 0.4 MG SL tablet Place 1 tablet (0.4 mg total) under the tongue every 5 (five) minutes as needed for Chest pain May take up to 3 doses. 25 tablet 3 NOVOFINE PLUS 32 gauge x 1/6" Ndle Use 1 each once daily as directed for insulin injections 100 each 3 omeprazole (PRILOSEC) 20 MG DR capsule Take 1 capsule (20 mg total) by mouth every morning 90 capsule 3 pen needle, diabetic (BD NANO 2ND GEN PEN NEEDLE) 32 gauge x 5/32" Ndle Inject 1 each subcutaneously once daily AS DIRECTED 100 each 3 polyethylene glycol (MIRALAX) packet Take 17 g by mouth once daily Mix in 4-8ounces of fluid prior to taking. pregabalin (LYRICA) 25 MG capsule Take 1 capsule (25 mg total) by mouth once daily 30 capsule 5 ranolazine (RANEXA) 500 MG ER tablet Take 500 mg by mouth 2 (two) times daily RENA-VITE 0.8 mg Take 1 tablet by mouth once daily rosuvastatin (CRESTOR) 10 MG tablet Take 1 tablet (10 mg total) by mouth once daily 90 tablet 3 TORsemide (DEMADEX) 20 MG tablet Take 20 mg by mouth 4 (four) times daily triamcinolone 0.1 % ointment 1(ONE) APPLICATION(S) TOPICAL 2(TWO) TIMES A DAY  No current facility-administered medications on file prior to visit.  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  (CMS-HCC) S/P 3-vessel CABG 2002 Bronchiectasis without complication (CMS/HHS-HCC) 09/10/2020 Carotid atherosclerosis, bilateral Chronic diastolic congestive heart failure (CMS/HHS-HCC) Chronic obstructive pulmonary disease (CMS/HHS-HCC) 03/28/2017 Diabetic retinopathy of both eyes (CMS/HHS-HCC) End-stage renal disease on peritoneal dialysis (CMS/HHS-HCC) GERD (gastroesophageal reflux disease) 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 (CMS/HHS-HCC) 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 Home Dialysis Hyperlipidemia associated with type 2 diabetes mellitus (CMS/HHS-HCC) Hypertension associated with type 2 diabetes mellitus (CMS/HHS-HCC) 07/29/2011 Hypothyroidism (acquired) Lumbar degenerative disc disease Major depression in full remission (CMS-HCC) Osteoporosis, postmenopausal PAD (peripheral artery disease) (CMS-HCC) Seborrheic dermatitis Sensorineural hearing loss (SNHL) of both ears TIA (transient ischemic attack) 2019 Vitamin D deficiency   Past Surgical History: Procedure Laterality Date TONSILLECTOMY Bilateral 1953 HYSTERECTOMY VAGINAL 1971 for descensus CORONARY ARTERY BYPASS W/VENOUS & ARTERIAL GRAFTS 01/2001 3 vessels LAPAROSCOPIC CHOLECYSTECTOMY 2016 Left total hip arthroplasty 03/28/2023 Dr Ernest Pine REPLACEMENT TOTAL HIP W/ RESURFACING IMPLANTS Left 04/02/2023 CAROTID ENDARTERECTOMY Left CAROTID ENDARTERECTOMY Right EXTRACTION CATARACT EXTRACAPSULAR W/INSERTION INTRAOCULAR PROSTHESIS Left EXTRACTION CATARACT EXTRACAPSULAR W/INSERTION INTRAOCULAR PROSTHESIS Right skin cancer removal   Physical Exam  Ht: Wt: BMI: There is no height or weight on file to calculate BMI.  General/Constitutional: No apparent distress: well-nourished and well developed. Eyes: Pupils equal,  round with synchronous movement. Lymphatic: No palpable adenopathy. Respiratory: Patient has good  chest rise and fall with inspiration and expiration. All lung fields are clear to auscultation bilaterally. There is no Rales, rhonchi or wheezes appreciated. Cardiovascular: Upon auscultation there is a regular rate and rhythm . There is a grade 2/3 murmur best appreciated over the aortic region. Patient states that she has had this worked up by her cardiologist in the past and found to be an innocent murmur. There are no rubs, gallops or heaves appreciated. There is mild swelling down the lower extremities with peripheral vascular skin changes noted. Posterior tibial pulses appreciated bilaterally, cap refill does appear to be slightly reduced. Integumentary: No impressive skin lesions present, except as noted in detailed exam. Neuro/Psych: Normal mood and affect, oriented to person, place and time. Musculoskeletal: see exam below  Right hip exam Upon inspection of the patient's right hip there does not appear to be any skin changes, open abrasions, swelling or redness.  Right Hip: Soft tissue swelling: Negative Erythema: Negative Crepitance: Negative Tenderness: Greater trochanter is nontender to palpation. Severe pain is elicited by axial compression or extremes of rotation. Atrophy: No atrophy. Fair hip flexor and abductor strength. Noted to be slightly stronger on patients left leg Range of Motion: EXT/FLEX: 0/0/100 ADD/ABD: 20/0/30 IR/ER: 5/0/30  Patient is neurovascularly intact down their lower extremity to all dermatomes. Posterior tibial pulses appreciated. Of note, patient has slow cap refill with skin changes and discoloration down her bilateral arms and legs.  Patient also noted to have skin biopsies performed on her calf along the medial side of her right instep and the lateral side above her left ankle. Patient states that these were done months ago, the Band-Aids were removed, there does appear to be good skin healing without any active drainage.  A quick exam of the  patient's left elbow shows an open skin abrasion about the size of a half dollar coin that does not penetrate down past the dermis with active healing. No surrounding redness or pus appreciated. Patient noted to have a little bit of tenderness to palpation over the abrasion site, however with full ROM consistent with patient's baseline with minimal to no increase in discomfort. Patient noted to have very thin and poor skin quality.  Right Hip Imaging: No images were ordered today. However her x-rays from 10/05/2023 were reviewed. Films of the right hip show significant loss of cartilage space along the superior margin of the femoral acetabular junction. There is complete bone-on-bone articulation. There does appear to be some flattening of the femoral head along this margin. There are subchondral changes appreciated as well as small osteophytes present along the inferior and superior margins of the acetabulum and femoral head. Of note, patient noted to have rather severe calcific changes of descending vasculature. No fractures, lytic lesions or gross deformities appreciated on films  Assesment and Plan Hip DJD  I have recommended that Araceli Bouche undergo right total hip replacement . Consents has been signed. The risks, benefits, prognosis and alternatives including but not limited to DVT, PE, infection, neurovascular injury, failure of the procedure and death were explained to the patient and she is willing to proceed with surgery as described to her by myself. Plan will be for post operative admission of at least 1 midnight for pain control and PT. She will be managed with DVT prophylaxis, antibiotics preoperatively for 24 hours and aggressive in patient rehab.  Pre, intra and post op interventions were discussed. Patient  has good understanding  Medication Reconciliation was performed. Discussed cessation of aspirin, vitamins and supplements.  A total of 55 minutes was spent reviewing patient's  charts, medical reconciliation, discussing/educating the patient about surgical interventions, and answering any questions provided by the patient.  JOSHUA Kendrick Fries, PA [facility] 11/06/2023  Electronically signed by Raenette Rover, PA at 11/07/2023 4:05 PM EST

## 2023-11-17 ENCOUNTER — Ambulatory Visit
Admission: EM | Admit: 2023-11-17 | Discharge: 2023-11-17 | Disposition: A | Discharge status: Home / Self Care | Payer: Medicare PPO | Attending: Family Medicine | Admitting: Family Medicine

## 2023-11-17 ENCOUNTER — Telehealth: Payer: Self-pay | Admitting: Family Medicine

## 2023-11-17 DIAGNOSIS — R300 Dysuria: Secondary | ICD-10-CM | POA: Diagnosis present

## 2023-11-17 LAB — URINALYSIS, W/ REFLEX TO CULTURE (INFECTION SUSPECTED)
Glucose, UA: 100 mg/dL — AB
Hgb urine dipstick: NEGATIVE
Ketones, ur: NEGATIVE mg/dL
Nitrite: POSITIVE — AB
Protein, ur: 100 mg/dL — AB
RBC / HPF: NONE SEEN RBC/hpf (ref 0–5)
Specific Gravity, Urine: 1.015 (ref 1.005–1.030)
WBC, UA: >50 WBC/hpf (ref 0–5)
pH: 5.5 (ref 5.0–8.0)

## 2023-11-17 MED ORDER — CEFDINIR 300 MG PO CAPS: 300.0000 mg | ORAL_CAPSULE | Freq: Every day | ORAL | 0 refills | Status: DC

## 2023-11-17 NOTE — ED Triage Notes (Signed)
Pt states that she has some urinary frequency and burning with urination x2 days

## 2023-11-17 NOTE — ED Provider Notes (Signed)
[facility]    CSN: [id number] Arrival date & time: 11/17/23  0909      History   Chief Complaint Chief Complaint  Patient presents with   Urinary Frequency    Urinary frequency and burning with urination     HPI HPI Alexandria Moody is a 77 y.o. female.    Alexandria Moody presents for urinary frequency and dysuria that started 2 days ago.  Tried nothing prior to arrival.   No LMP recorded. Patient has had a hysterectomy.    - Abnormal vaginal discharge: no - vaginal odor: no - vaginal bleeding: no - Dysuria: yes - Hematuria: no - Urinary urgency:no  - Urinary frequency: yes  - Fever: no - Abdominal pain: no  - Pelvic pain: no - Rash/Skin lesions/mouth ulcers: no - Nausea: no  - Vomiting: no  - Back Pain: no        Past Medical History:  Diagnosis Date   Adenomatous polyp of colon    Anemia of chronic renal failure    a.) recieving iron infusions + epoetin alfa-epbx (Retacrit)   Anginal pain (HCC)    Aortic atherosclerosis (HCC)    Arthritis    Atherosclerosis of native arteries of extremity with intermittent claudication (HCC)    a.) ABI/TBI 06/24/2021: 0.95 RIGHT, 0.72 LEFT   Bilateral carotid artery disease (HCC) 07/25/2021   a.) carotid doppler 07/25/2021: < 50% RICA, < 50% LICA.   Bradycardia    CHF (congestive heart failure) (HCC)    a.) TTE 05/12/2020: LVEF 55-60%; mild LA enlargement; trivial to mild MR; G1DD; b.) TTE 01/25/2023: EF 55-60%, mild MR, G1DD.   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   a.)  smolding phase ANCA associated pauci immune   Heart murmur    HLD (hyperlipidemia)    Hypertension    Hypothyroidism    Insomnia    a.) uses melatonin PRN   Interstitial lung disease (HCC)    Long term current use of immunosuppressive drug    a.) hydroxychloriquine for ANCA (+) glomerulonephritis   Long-term use of aspirin therapy    Macular degeneration    MDD (major depressive disorder)    Osteoporosis    a.) on denosumab injections   PAD (peripheral artery disease) (HCC)    Pneumonia 11/2021   S/P CABG x 3 2002   a.) LIMA-LAD, SVG-diagnoal, SVG-OM   Seborrheic dermatitis    SNHL (sensorineural hearing loss)    Squamous cell skin cancer    legs, hands, scalp   TIA (transient ischemic attack) 2019   Type 2 diabetes mellitus with chronic kidney disease on chronic dialysis, with long-term current use of insulin (HCC)    Vitamin D deficiency     Patient Active Problem List   Diagnosis Date Noted   Status post total hip replacement, left 03/28/2023   Acute exacerbation of CHF (congestive heart failure) (HCC) 01/25/2023   Insulin dependent type 2 diabetes mellitus (HCC) 01/24/2023   CAP (community acquired pneumonia) 01/24/2023   Dyspnea 01/24/2023   ESRD on peritoneal dialysis (  HCC) 01/24/2023   Primary osteoarthritis of both hips 11/26/2022   Pain of left hip joint 03/09/2022   Nonexudative age-related macular degeneration, right eye, early dry stage 10/04/2021   Exudative age-related macular degeneration of left eye with active choroidal neovascularization (HCC) 10/04/2021   Macrocytosis 05/10/2021   Stage 4 chronic kidney disease (HCC) 05/10/2021   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   Osteoporosis, 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/2021   Secondary hyperparathyroidism, renal (HCC) 03/24/2021   B12 deficiency 02/01/2021   Senile purpura (HCC) 12/24/2020   Carotid atherosclerosis, bilateral 12/21/2020   Chronic diastolic congestive heart failure (HCC) 12/21/2020    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) 12/21/2020   GERD (gastroesophageal reflux disease) 12/21/2020   Hypothyroidism (acquired) 12/21/2020   Major depression in full remission (HCC) 12/21/2020   Seborrheic dermatitis 12/21/2020   Sensorineural hearing loss (SNHL) of both ears 12/21/2020   Thyroid disease 12/21/2020   Vitamin D deficiency 12/21/2020   Postoperative follow-up 12/09/2020   Cystoid macular edema of right eye 11/16/2020   Macular pucker, right eye 10/12/2020   Atherosclerosis of abdominal aorta (HCC) 09/10/2020   Bronchiectasis without complication (HCC) 09/10/2020   High risk medication use 09/10/2020   Severe nonproliferative diabetic retinopathy of left eye, with macular edema, associated with type 2 diabetes mellitus (HCC) 09/07/2020   Cystoid macular edema of left eye 09/07/2020   History of vitrectomy 09/07/2020   Moderate nonproliferative diabetic retinopathy of right eye (HCC) 09/07/2020   Both eyes affected by mild nonproliferative diabetic retinopathy with macular edema, associated with type 2 diabetes mellitus (HCC) 09/07/2020   Essential hypertension 11/18/2019   Fatigue 11/18/2019   Stage 3b chronic kidney disease (HCC) 11/18/2019   Non-rheumatic aortic sclerosis 07/22/2018   Adenomatous polyp of colon 10/10/2017   Chronic obstructive pulmonary disease (HCC) 03/28/2017   Osteopenia of multiple sites 06/05/2016   PAD (peripheral artery disease) (HCC) 08/17/2015   Atherosclerosis of native arteries of extremity with intermittent claudication (HCC) 06/29/2015   Myalgia 06/29/2015   Stable angina (HCC) 02/23/2015   TIA (transient ischemic attack) 01/18/2015   Anemia in chronic kidney disease 08/20/2014   Anemia in stage 4 chronic kidney disease (HCC) 08/20/2014   Dizziness 07/24/2014   DOE (dyspnea on exertion) 07/24/2014   Calculus of gallbladder w/o mention of cholecystitis or obstruction 01/05/2014   Renal  failure syndrome 11/19/2013   Contusion of right knee 11/10/2013   Facial contusion 11/10/2013   Fall 11/10/2013   Left elbow pain 11/10/2013   Left radial head fracture 11/10/2013   Right knee pain 11/10/2013   Occlusion and stenosis of carotid artery 06/23/2013   Stenosis of carotid artery 06/23/2013   Syncope 03/09/2013   Anemia of chronic disease 11/01/2012   Chronic kidney disease, stage 4 (severe) (HCC) 09/11/2012   Stage 5 chronic kidney disease on chronic dialysis (HCC) 09/11/2012   Anemia 06/24/2012   Type 2 diabetes mellitus (HCC) 07/29/2011   Abnormal cardiovascular stress test 07/29/2011   Coronary artery disease involving native heart 07/29/2011   Hypertension associated with type 2 diabetes mellitus (HCC) 07/29/2011   Hyperlipidemia associated with type 2 diabetes mellitus (HCC) 07/29/2011   Obesity (BMI 30-39.9) 07/29/2011   Situational stress 07/29/2011    Past Surgical History:  Procedure Laterality Date   ABDOMINAL HYSTERECTOMY  1971   CAPD INSERTION N/A 02/03/2022   Procedure: LAPAROSCOPIC INSERTION CONTINUOUS AMBULATORY PERITONEAL DIALYSIS  (CAPD) CATHETER;  Surgeon: Leafy Ro, MD;  Location: [facility];  Service: General;  Laterality: N/A;  Provider requesting 1.5 hours / 90 minutes for procedure.   CARDIAC CATHETERIZATION     CAROTID ENDARTERECTOMY Left    CAROTID ENDARTERECTOMY Right    CATARACT EXTRACTION Bilateral 2014   CHOLECYSTECTOMY  2020   COLONOSCOPY     CORONARY ARTERY BYPASS GRAFT N/A 01/2001   Procedure: 3v CORONARY ARTERY ARTERY BYPASS GRAFT   EYE SURGERY Left 07/22/2020   Dr. Kristine Royal, [facility]   TONSILLECTOMY  1953   TOTAL HIP ARTHROPLASTY Left 03/28/2023   Procedure: TOTAL HIP ARTHROPLASTY;  Surgeon: Donato Heinz, MD;  Location: [facility];  Service: Orthopedics;  Laterality: Left;    OB History   No obstetric history on file.      Home Medications    Prior to Admission medications   Medication Sig Start Date End  Date Taking? Authorizing Provider  acetaminophen (TYLENOL) 650 MG CR tablet Take 1,300 mg by mouth every 8 (eight) hours as needed for pain.   Yes [provider]  albuterol (VENTOLIN HFA) 108 (90 Base) MCG/ACT inhaler Inhale 2 puffs into the lungs every 6 (six) hours as needed for wheezing or shortness of breath. 07/22/18  Yes [provider]  amLODipine (NORVASC) 5 MG tablet Take 5 mg by mouth at bedtime. 10/03/19  Yes [provider]  aspirin EC 81 MG tablet Take 1 tablet (81 mg total) by mouth 2 (two) times daily. Patient taking differently: Take 81 mg by mouth daily. 03/29/23  Yes Evon Slack, PA-C  calcitRIOL (ROCALTROL) 0.25 MCG capsule Take 0.25 mcg by mouth daily. 07/20/15  Yes [provider]  cholecalciferol 25 MCG (1000 UT) tablet Take 1,000 Units by mouth daily.   Yes [provider]  citalopram (CELEXA) 20 MG tablet Take 20 mg by mouth every morning. 02/26/19  Yes [provider]  denosumab (PROLIA) 60 MG/ML SOSY injection Inject 60 mg into the skin every 6 (six) months.   Yes [provider]  DENTA 5000 PLUS 1.1 % CREA dental cream Place 1 Application onto teeth at bedtime. 10/30/23  Yes [provider]  Flaxseed, Linseed, (FLAXSEED OIL) 1400 MG CAPS Take 1,400 mg by mouth daily.   Yes [provider]  hydroxychloroquine (PLAQUENIL) 200 MG tablet Take 200 mg by mouth daily. 09/27/21  Yes [provider]  insulin glargine, 1 Unit Dial, (TOUJEO SOLOSTAR) 300 UNIT/ML Solostar Pen Inject 16 Units into the skin daily. 03/26/20  Yes [provider]  isosorbide mononitrate (IMDUR) 120 MG 24 hr tablet Take 120 mg by mouth at bedtime. 04/23/20  Yes [provider]  levothyroxine (SYNTHROID) 75 MCG tablet Take 75 mcg by mouth daily before breakfast. 04/05/20  Yes [provider]  losartan (COZAAR) 100 MG tablet Take 100 mg by mouth daily.   Yes [provider]  Melatonin 10 MG  TABS Take 10 mg by mouth at bedtime.   Yes [provider]  Multiple Minerals-Vitamins (CALCIUM CITRATE +) TABS Take 1 tablet by mouth daily. 300 mg tablet   Yes [provider]  multivitamin (RENA-VIT) TABS tablet Take 1 tablet by mouth daily. 08/15/23  Yes [provider]  nitroGLYCERIN (NITROSTAT) 0.4 MG SL tablet Place 0.4 mg under the tongue every 5 (five) minutes as needed for chest pain. 09/10/20  Yes [provider]  omeprazole (PRILOSEC) 20 MG capsule Take 20 mg by mouth every morning. 07/05/18  Yes [provider]  polyethylene glycol powder (GLYCOLAX/MIRALAX) 17 GM/SCOOP powder Take 17  g by mouth daily.   Yes [provider]  ranolazine (RANEXA) 500 MG 12 hr tablet Take 500 mg by mouth 2 (two) times daily. 04/05/20  Yes [provider]  rosuvastatin (CRESTOR) 10 MG tablet Take 10 mg by mouth daily. 03/31/21  Yes [provider]  torsemide (DEMADEX) 20 MG tablet Take 20 mg by mouth in the morning, at noon, in the evening, and at bedtime. 02/09/21 11/09/23  [provider]    Family History History reviewed. No pertinent family history.  Social History Social History   Tobacco Use   Smoking status: Former    Current packs/day: 0.00    Average packs/day: 2.0 packs/day for 25.0 years (50.0 ttl pk-yrs)    Types: Cigarettes    Start date: 22    Quit date: 2000    Years since quitting: 24.9    Passive exposure: Past   Smokeless tobacco: Never  Vaping Use   Vaping status: Never Used  Substance Use Topics   Alcohol use: Never   Drug use: Never     Allergies   Tape   Review of Systems Review of Systems: :negative unless otherwise stated in HPI.      Physical Exam Triage Vital Signs ED Triage Vitals  Encounter Vitals Group     BP 11/17/23 0953 (!) 146/77     Systolic BP Percentile --      Diastolic BP Percentile --      Pulse Rate 11/17/23 0953 79     Resp 11/17/23 0953 15     Temp  11/17/23 0953 98.3 F (36.8 C)     Temp Source 11/17/23 0953 Oral     SpO2 11/17/23 0953 94 %     Weight 11/17/23 0952 149 lb (67.6 kg)     Height 11/17/23 0952 5\' 3"  (1.6 m)     Head Circumference --      Peak Flow --      Pain Score 11/17/23 0952 5     Pain Loc --      Pain Education --      Exclude from Growth Chart --    No data found.  Updated Vital Signs BP (!) 146/77 (BP Location: Right Arm)   Pulse 79   Temp 98.3 F (36.8 C) (Oral)   Resp 15   Ht 5\' 3"  (1.6 m)   Wt 67.6 kg   SpO2 94%   BMI 26.39 kg/m   Visual Acuity Right Eye Distance:   Left Eye Distance:   Bilateral Distance:    Right Eye Near:   Left Eye Near:    Bilateral Near:     Physical Exam GEN: non-toxic appearing female in no acute distress  CVS: well perfused  RESP: speaking in full sentences without pause, no respiratory distress     UC Treatments / Results  Labs (all labs ordered are listed, but only abnormal results are displayed) Labs Reviewed  URINALYSIS, W/ REFLEX TO CULTURE (INFECTION SUSPECTED)    EKG   Radiology No results found.  Procedures Procedures (including critical care time)  Medications Ordered in UC Medications - No data to display  Initial Impression / Assessment and Plan / UC Course  I have reviewed the triage vital signs and the nursing notes.  Pertinent labs & imaging results that were available during my care of the patient were reviewed by me and considered in my medical decision making (see chart for details).     Acute cystitis:  Patient  is a 77 y.o. female on peritoneal dialysis who presents for 2 days of dysuria and urinary frequency.  Overall, patient is well-appearing and afebrile.  Vital signs stable.  Unfortunately, she was not able to urinate here. Future orders placed for urinalysis and urine culture. Patient will return with her urine sample.   Return precautions including abdominal pain, fever, chills, nausea, or vomiting given. Follow-up,   if symptoms not improving or getting worse. Discussed MDM, treatment plan and plan for follow-up with patient and her daughter who agree with plan.     Final Clinical Impressions(s) / UC Diagnoses   Final diagnoses:  Dysuria     Discharge Instructions      Bring your urine sample back.  I will call you with results.      ED Prescriptions   None    PDMP not reviewed this encounter.   Katha Cabal, DO 11/17/23 1055

## 2023-11-17 NOTE — Discharge Instructions (Addendum)
Bring your urine sample back.  I will call you with results.

## 2023-11-17 NOTE — Telephone Encounter (Signed)
Called and spoke with Alexandria Moody about her urine sample showing evidence of a UTI.  She has a urinary tract infection.  Given her dialysis states we will treat with cefdinir once daily at 300 mg for 7 days.  Urine culture collected.  Katha Cabal, DO

## 2023-11-19 LAB — URINE CULTURE: Culture: 40000 — AB

## 2023-11-20 ENCOUNTER — Encounter: Payer: Self-pay | Admitting: Orthopedic Surgery

## 2023-11-20 MED ORDER — GABAPENTIN 300 MG PO CAPS
300.0000 mg | ORAL_CAPSULE | Freq: Once | ORAL | Status: AC
  Administered 2023-11-21: 300 mg via ORAL

## 2023-11-20 MED ORDER — CEFAZOLIN SODIUM-DEXTROSE 2-4 GM/100ML-% IV SOLN
2.0000 g | INTRAVENOUS | Status: AC
  Administered 2023-11-21: 2 g via INTRAVENOUS

## 2023-11-20 MED ORDER — SODIUM CHLORIDE 0.9 % IV SOLN: INTRAVENOUS | Status: DC

## 2023-11-20 MED ORDER — ORAL CARE MOUTH RINSE: 15.0000 mL | Freq: Once | OROMUCOSAL | Status: AC

## 2023-11-20 MED ORDER — TRANEXAMIC ACID-NACL 1000-0.7 MG/100ML-% IV SOLN
1000.0000 mg | INTRAVENOUS | Status: AC
  Administered 2023-11-21: 1000 mg via INTRAVENOUS

## 2023-11-20 MED ORDER — DEXAMETHASONE SODIUM PHOSPHATE 10 MG/ML IJ SOLN
8.0000 mg | Freq: Once | INTRAMUSCULAR | Status: AC
  Administered 2023-11-21: 8 mg via INTRAVENOUS

## 2023-11-20 MED ORDER — CHLORHEXIDINE GLUCONATE 0.12 % MT SOLN
15.0000 mL | Freq: Once | OROMUCOSAL | Status: AC
  Administered 2023-11-21: 15 mL via OROMUCOSAL

## 2023-11-20 MED ORDER — CHLORHEXIDINE GLUCONATE 4 % EX SOLN: 60.0000 mL | Freq: Once | CUTANEOUS | Status: DC

## 2023-11-21 ENCOUNTER — Encounter: Payer: Self-pay | Admitting: Orthopedic Surgery

## 2023-11-21 ENCOUNTER — Other Ambulatory Visit: Payer: Self-pay

## 2023-11-21 ENCOUNTER — Ambulatory Visit: Payer: Medicare PPO | Admitting: Urgent Care

## 2023-11-21 ENCOUNTER — Observation Stay
Admission: RE | Admit: 2023-11-21 | Discharge: 2023-11-22 | Disposition: A | Discharge status: Home / Self Care | Payer: Medicare PPO | Attending: Orthopedic Surgery | Admitting: Orthopedic Surgery

## 2023-11-21 ENCOUNTER — Encounter
Admission: RE | Disposition: A | Discharge status: Home / Self Care | Payer: Self-pay | Source: Home / Self Care | Attending: Orthopedic Surgery

## 2023-11-21 ENCOUNTER — Observation Stay: Payer: Medicare PPO

## 2023-11-21 DIAGNOSIS — Z8673 Personal history of transient ischemic attack (TIA), and cerebral infarction without residual deficits: Secondary | ICD-10-CM | POA: Insufficient documentation

## 2023-11-21 DIAGNOSIS — I120 Hypertensive chronic kidney disease with stage 5 chronic kidney disease or end stage renal disease: Secondary | ICD-10-CM | POA: Diagnosis not present

## 2023-11-21 DIAGNOSIS — E039 Hypothyroidism, unspecified: Secondary | ICD-10-CM | POA: Insufficient documentation

## 2023-11-21 DIAGNOSIS — I132 Hypertensive heart and chronic kidney disease with heart failure and with stage 5 chronic kidney disease, or end stage renal disease: Secondary | ICD-10-CM | POA: Diagnosis not present

## 2023-11-21 DIAGNOSIS — Z794 Long term (current) use of insulin: Secondary | ICD-10-CM | POA: Diagnosis not present

## 2023-11-21 DIAGNOSIS — Z992 Dependence on renal dialysis: Secondary | ICD-10-CM | POA: Diagnosis not present

## 2023-11-21 DIAGNOSIS — E1122 Type 2 diabetes mellitus with diabetic chronic kidney disease: Secondary | ICD-10-CM | POA: Insufficient documentation

## 2023-11-21 DIAGNOSIS — E119 Type 2 diabetes mellitus without complications: Secondary | ICD-10-CM

## 2023-11-21 DIAGNOSIS — M1611 Unilateral primary osteoarthritis, right hip: Secondary | ICD-10-CM | POA: Diagnosis present

## 2023-11-21 DIAGNOSIS — Z96642 Presence of left artificial hip joint: Secondary | ICD-10-CM | POA: Diagnosis not present

## 2023-11-21 DIAGNOSIS — Z96641 Presence of right artificial hip joint: Secondary | ICD-10-CM

## 2023-11-21 DIAGNOSIS — I5032 Chronic diastolic (congestive) heart failure: Secondary | ICD-10-CM | POA: Insufficient documentation

## 2023-11-21 DIAGNOSIS — I2510 Atherosclerotic heart disease of native coronary artery without angina pectoris: Secondary | ICD-10-CM | POA: Insufficient documentation

## 2023-11-21 DIAGNOSIS — D631 Anemia in chronic kidney disease: Secondary | ICD-10-CM | POA: Diagnosis not present

## 2023-11-21 DIAGNOSIS — J449 Chronic obstructive pulmonary disease, unspecified: Secondary | ICD-10-CM | POA: Diagnosis not present

## 2023-11-21 DIAGNOSIS — N186 End stage renal disease: Secondary | ICD-10-CM | POA: Diagnosis not present

## 2023-11-21 DIAGNOSIS — E538 Deficiency of other specified B group vitamins: Secondary | ICD-10-CM

## 2023-11-21 DIAGNOSIS — Z01812 Encounter for preprocedural laboratory examination: Secondary | ICD-10-CM

## 2023-11-21 HISTORY — PX: TOTAL HIP ARTHROPLASTY: SHX124

## 2023-11-21 HISTORY — DX: End stage renal disease: Z99.2

## 2023-11-21 HISTORY — DX: Insomnia, unspecified: G47.00

## 2023-11-21 HISTORY — DX: Procedure and treatment not carried out because of other contraindication: Z53.09

## 2023-11-21 HISTORY — DX: Other intervertebral disc degeneration, lumbar region without mention of lumbar back pain or lower extremity pain: M51.369

## 2023-11-21 HISTORY — DX: Major depressive disorder, single episode, unspecified: F32.9

## 2023-11-21 HISTORY — DX: End stage renal disease: N18.6

## 2023-11-21 HISTORY — DX: Other cervical disc degeneration, unspecified cervical region: M50.30

## 2023-11-21 HISTORY — DX: Unspecified sensorineural hearing loss: H90.5

## 2023-11-21 HISTORY — DX: Seborrheic dermatitis, unspecified: L21.9

## 2023-11-21 HISTORY — DX: Long term (current) use of aspirin: Z79.82

## 2023-11-21 LAB — POCT I-STAT, CHEM 8
BUN: 43 mg/dL — ABNORMAL HIGH (ref 8–23)
Calcium, Ion: 0.86 mmol/L — CL (ref 1.15–1.40)
Chloride: 102 mmol/L (ref 98–111)
Creatinine, Ser: 2.9 mg/dL — ABNORMAL HIGH (ref 0.44–1.00)
Glucose, Bld: 107 mg/dL — ABNORMAL HIGH (ref 70–99)
HCT: 32 % — ABNORMAL LOW (ref 36.0–46.0)
Hemoglobin: 10.9 g/dL — ABNORMAL LOW (ref 12.0–15.0)
Potassium: 3.9 mmol/L (ref 3.5–5.1)
Sodium: 138 mmol/L (ref 135–145)
TCO2: 24 mmol/L (ref 22–32)

## 2023-11-21 LAB — GLUCOSE, CAPILLARY
Glucose-Capillary: 171 mg/dL — ABNORMAL HIGH (ref 70–99)
Glucose-Capillary: 348 mg/dL — ABNORMAL HIGH (ref 70–99)

## 2023-11-21 SURGERY — ARTHROPLASTY, HIP, TOTAL,POSTERIOR APPROACH
Anesthesia: General | Site: Hip | Laterality: Right

## 2023-11-21 MED ORDER — ONDANSETRON HCL 4 MG/2ML IJ SOLN: 4.0000 mg | Freq: Four times a day (QID) | INTRAMUSCULAR | Status: DC | PRN

## 2023-11-21 MED ORDER — HYDROMORPHONE HCL 1 MG/ML IJ SOLN: 0.5000 mg | INTRAMUSCULAR | Status: DC | PRN

## 2023-11-21 MED ORDER — FENTANYL CITRATE (PF) 100 MCG/2ML IJ SOLN
25.0000 ug | INTRAMUSCULAR | Status: DC | PRN
  Administered 2023-11-21 (×2): 25 ug via INTRAVENOUS
  Administered 2023-11-21: 50 ug via INTRAVENOUS
  Administered 2023-11-21 (×2): 25 ug via INTRAVENOUS

## 2023-11-21 MED ORDER — LACTATED RINGERS IV SOLN: INTRAVENOUS | Status: AC

## 2023-11-21 MED ORDER — ALUM & MAG HYDROXIDE-SIMETH 200-200-20 MG/5ML PO SUSP: 30.0000 mL | ORAL | Status: DC | PRN

## 2023-11-21 MED ORDER — CEFAZOLIN SODIUM-DEXTROSE 2-4 GM/100ML-% IV SOLN
INTRAVENOUS | Status: AC
  Filled 2023-11-21: qty 100

## 2023-11-21 MED ORDER — DELFLEX-LC/1.5% DEXTROSE 344 MOSM/L IP SOLN
INTRAPERITONEAL | Status: DC
  Filled 2023-11-21 (×2): qty 3000

## 2023-11-21 MED ORDER — SODIUM FLUORIDE 1.1 % DT CREA: 1.0000 | TOPICAL_CREAM | Freq: Every day | DENTAL | Status: DC

## 2023-11-21 MED ORDER — POLYETHYLENE GLYCOL 3350 17 G PO PACK
17.0000 g | PACK | Freq: Every day | ORAL | Status: DC
  Filled 2023-11-21: qty 1

## 2023-11-21 MED ORDER — LIDOCAINE HCL (PF) 2 % IJ SOLN
INTRAMUSCULAR | Status: AC
  Filled 2023-11-21: qty 5

## 2023-11-21 MED ORDER — ALBUMIN HUMAN 5 % IV SOLN
INTRAVENOUS | Status: AC
Start: 2023-11-21 — End: ?
  Filled 2023-11-21: qty 250

## 2023-11-21 MED ORDER — ONDANSETRON HCL 4 MG/2ML IJ SOLN
INTRAMUSCULAR | Status: DC | PRN
  Administered 2023-11-21: 4 mg via INTRAVENOUS

## 2023-11-21 MED ORDER — SODIUM CHLORIDE (PF) 0.9 % IJ SOLN
INTRAMUSCULAR | Status: AC
Start: 2023-11-21 — End: ?
  Filled 2023-11-21: qty 20

## 2023-11-21 MED ORDER — BISACODYL 10 MG RE SUPP: 10.0000 mg | Freq: Every day | RECTAL | Status: DC | PRN

## 2023-11-21 MED ORDER — DIPHENHYDRAMINE HCL 12.5 MG/5ML PO ELIX: 12.5000 mg | ORAL_SOLUTION | ORAL | Status: DC | PRN

## 2023-11-21 MED ORDER — VASOPRESSIN 20 UNIT/ML IV SOLN
INTRAVENOUS | Status: DC | PRN
  Administered 2023-11-21 (×3): 1 [IU] via INTRAVENOUS

## 2023-11-21 MED ORDER — FENTANYL CITRATE (PF) 100 MCG/2ML IJ SOLN
INTRAMUSCULAR | Status: AC
  Filled 2023-11-21: qty 2

## 2023-11-21 MED ORDER — VASOPRESSIN 20 UNIT/ML IV SOLN
INTRAVENOUS | Status: AC
Start: 2023-11-21 — End: ?
  Filled 2023-11-21: qty 1

## 2023-11-21 MED ORDER — HYDROMORPHONE HCL 1 MG/ML IJ SOLN
INTRAMUSCULAR | Status: AC
  Filled 2023-11-21: qty 1

## 2023-11-21 MED ORDER — PHENOL 1.4 % MT LIQD: 1.0000 | OROMUCOSAL | Status: DC | PRN

## 2023-11-21 MED ORDER — ROSUVASTATIN CALCIUM 20 MG PO TABS
10.0000 mg | ORAL_TABLET | Freq: Every day | ORAL | Status: DC
  Administered 2023-11-22: 10 mg via ORAL
  Filled 2023-11-21: qty 0.5
  Filled 2023-11-21: qty 1
  Filled 2023-11-21: qty 0.5

## 2023-11-21 MED ORDER — ACETAMINOPHEN 325 MG PO TABS: 325.0000 mg | ORAL_TABLET | Freq: Four times a day (QID) | ORAL | Status: DC | PRN

## 2023-11-21 MED ORDER — OXYCODONE HCL 5 MG PO TABS
ORAL_TABLET | ORAL | Status: AC
  Filled 2023-11-21: qty 1

## 2023-11-21 MED ORDER — METOCLOPRAMIDE HCL 5 MG PO TABS
10.0000 mg | ORAL_TABLET | Freq: Three times a day (TID) | ORAL | Status: DC
  Administered 2023-11-21 – 2023-11-22 (×3): 10 mg via ORAL
  Filled 2023-11-21 (×3): qty 2

## 2023-11-21 MED ORDER — INSULIN GLARGINE-YFGN 100 UNIT/ML ~~LOC~~ SOLN
16.0000 [IU] | Freq: Every evening | SUBCUTANEOUS | Status: DC
Start: 2023-11-21 — End: 2023-11-22
  Administered 2023-11-21: 16 [IU] via SUBCUTANEOUS
  Filled 2023-11-21 (×2): qty 0.16

## 2023-11-21 MED ORDER — HYDROXYCHLOROQUINE SULFATE 200 MG PO TABS
200.0000 mg | ORAL_TABLET | Freq: Every day | ORAL | Status: DC
  Administered 2023-11-22: 200 mg via ORAL
  Filled 2023-11-21: qty 1

## 2023-11-21 MED ORDER — TRAMADOL HCL 50 MG PO TABS
50.0000 mg | ORAL_TABLET | ORAL | Status: DC | PRN
  Administered 2023-11-21: 50 mg via ORAL

## 2023-11-21 MED ORDER — 0.9 % SODIUM CHLORIDE (POUR BTL) OPTIME
TOPICAL | Status: DC | PRN
  Administered 2023-11-21: 500 mL

## 2023-11-21 MED ORDER — OXYCODONE HCL 5 MG/5ML PO SOLN: 5.0000 mg | Freq: Once | ORAL | Status: AC | PRN

## 2023-11-21 MED ORDER — OXYCODONE HCL 5 MG PO TABS
ORAL_TABLET | ORAL | Status: AC
  Filled 2023-11-21: qty 2

## 2023-11-21 MED ORDER — ISOSORBIDE MONONITRATE ER 30 MG PO TB24
120.0000 mg | ORAL_TABLET | Freq: Every day | ORAL | Status: DC
  Administered 2023-11-21: 120 mg via ORAL
  Filled 2023-11-21: qty 4

## 2023-11-21 MED ORDER — ASPIRIN 81 MG PO CHEW
81.0000 mg | CHEWABLE_TABLET | Freq: Two times a day (BID) | ORAL | Status: DC
  Administered 2023-11-21 – 2023-11-22 (×2): 81 mg via ORAL
  Filled 2023-11-21 (×2): qty 1

## 2023-11-21 MED ORDER — TRANEXAMIC ACID-NACL 1000-0.7 MG/100ML-% IV SOLN
1000.0000 mg | Freq: Once | INTRAVENOUS | Status: AC
Start: 2023-11-21 — End: 2023-11-21
  Administered 2023-11-21: 1000 mg via INTRAVENOUS

## 2023-11-21 MED ORDER — LEVOTHYROXINE SODIUM 75 MCG PO TABS
75.0000 ug | ORAL_TABLET | Freq: Every day | ORAL | Status: DC
Start: 2023-11-22 — End: 2023-11-22
  Administered 2023-11-22: 75 ug via ORAL
  Filled 2023-11-21: qty 3
  Filled 2023-11-21: qty 1

## 2023-11-21 MED ORDER — CEFAZOLIN SODIUM-DEXTROSE 2-4 GM/100ML-% IV SOLN
2.0000 g | Freq: Four times a day (QID) | INTRAVENOUS | Status: AC
  Administered 2023-11-21 (×2): 2 g via INTRAVENOUS
  Filled 2023-11-21: qty 100

## 2023-11-21 MED ORDER — RENA-VITE PO TABS
1.0000 | ORAL_TABLET | Freq: Every day | ORAL | Status: DC
  Administered 2023-11-22: 1 via ORAL
  Filled 2023-11-21 (×2): qty 1

## 2023-11-21 MED ORDER — NOREPINEPHRINE 4 MG/250ML-% IV SOLN
INTRAVENOUS | Status: AC
  Filled 2023-11-21: qty 250

## 2023-11-21 MED ORDER — TRANEXAMIC ACID-NACL 1000-0.7 MG/100ML-% IV SOLN
INTRAVENOUS | Status: AC
Start: 2023-11-21 — End: ?
  Filled 2023-11-21: qty 100

## 2023-11-21 MED ORDER — INSULIN ASPART 100 UNIT/ML IJ SOLN
0.0000 [IU] | Freq: Every day | INTRAMUSCULAR | Status: DC
  Administered 2023-11-21: 4 [IU] via SUBCUTANEOUS
  Filled 2023-11-21: qty 1

## 2023-11-21 MED ORDER — ACETAMINOPHEN 10 MG/ML IV SOLN
INTRAVENOUS | Status: AC
Start: 2023-11-21 — End: ?
  Filled 2023-11-21: qty 100

## 2023-11-21 MED ORDER — ACETAMINOPHEN 10 MG/ML IV SOLN
INTRAVENOUS | Status: AC
  Filled 2023-11-21: qty 100

## 2023-11-21 MED ORDER — ACETAMINOPHEN 10 MG/ML IV SOLN
1000.0000 mg | Freq: Four times a day (QID) | INTRAVENOUS | Status: AC
  Administered 2023-11-21 – 2023-11-22 (×4): 1000 mg via INTRAVENOUS
  Filled 2023-11-21 (×3): qty 100

## 2023-11-21 MED ORDER — NITROGLYCERIN 0.4 MG SL SUBL: 0.4000 mg | SUBLINGUAL_TABLET | SUBLINGUAL | Status: DC | PRN

## 2023-11-21 MED ORDER — CALCIUM CITRATE 950 (200 CA) MG PO TABS
1.0000 | ORAL_TABLET | Freq: Every day | ORAL | Status: DC
  Administered 2023-11-22: 200 mg via ORAL
  Filled 2023-11-21: qty 1

## 2023-11-21 MED ORDER — SODIUM CHLORIDE 0.9 % IR SOLN
Status: DC | PRN
  Administered 2023-11-21: 3000 mL

## 2023-11-21 MED ORDER — CHLORHEXIDINE GLUCONATE 0.12 % MT SOLN
OROMUCOSAL | Status: AC
  Filled 2023-11-21: qty 15

## 2023-11-21 MED ORDER — LOSARTAN POTASSIUM 50 MG PO TABS
100.0000 mg | ORAL_TABLET | Freq: Every day | ORAL | Status: DC
  Administered 2023-11-22: 100 mg via ORAL
  Filled 2023-11-21: qty 2

## 2023-11-21 MED ORDER — EPHEDRINE SULFATE-NACL 50-0.9 MG/10ML-% IV SOSY
PREFILLED_SYRINGE | INTRAVENOUS | Status: DC | PRN
  Administered 2023-11-21: 10 mg via INTRAVENOUS
  Administered 2023-11-21: 5 mg via INTRAVENOUS

## 2023-11-21 MED ORDER — MENTHOL 3 MG MT LOZG: 1.0000 | LOZENGE | OROMUCOSAL | Status: DC | PRN

## 2023-11-21 MED ORDER — SURGIPHOR WOUND IRRIGATION SYSTEM - OPTIME: TOPICAL | Status: DC | PRN

## 2023-11-21 MED ORDER — HYDROMORPHONE HCL 1 MG/ML IJ SOLN
0.5000 mg | INTRAMUSCULAR | Status: AC | PRN
  Administered 2023-11-21 (×4): 0.5 mg via INTRAVENOUS

## 2023-11-21 MED ORDER — OXYCODONE HCL 5 MG PO TABS
10.0000 mg | ORAL_TABLET | ORAL | Status: DC | PRN
Start: 2023-11-21 — End: 2023-11-22
  Administered 2023-11-21: 10 mg via ORAL

## 2023-11-21 MED ORDER — FENTANYL CITRATE (PF) 100 MCG/2ML IJ SOLN
INTRAMUSCULAR | Status: DC | PRN
  Administered 2023-11-21: 25 ug via INTRAVENOUS
  Administered 2023-11-21: 50 ug via INTRAVENOUS
  Administered 2023-11-21: 25 ug via INTRAVENOUS

## 2023-11-21 MED ORDER — BUPIVACAINE HCL (PF) 0.5 % IJ SOLN
INTRAMUSCULAR | Status: DC | PRN
  Administered 2023-11-21: 3 mL

## 2023-11-21 MED ORDER — CITALOPRAM HYDROBROMIDE 20 MG PO TABS
20.0000 mg | ORAL_TABLET | ORAL | Status: DC
  Administered 2023-11-22: 20 mg via ORAL
  Filled 2023-11-21: qty 2
  Filled 2023-11-21: qty 1

## 2023-11-21 MED ORDER — PROPOFOL 1000 MG/100ML IV EMUL
INTRAVENOUS | Status: AC
Start: 2023-11-21 — End: ?
  Filled 2023-11-21: qty 100

## 2023-11-21 MED ORDER — MAGNESIUM HYDROXIDE 400 MG/5ML PO SUSP
30.0000 mL | Freq: Every day | ORAL | Status: DC
  Filled 2023-11-21: qty 30

## 2023-11-21 MED ORDER — RANOLAZINE ER 500 MG PO TB12
500.0000 mg | ORAL_TABLET | Freq: Two times a day (BID) | ORAL | Status: DC
  Administered 2023-11-21 – 2023-11-22 (×2): 500 mg via ORAL
  Filled 2023-11-21 (×2): qty 1

## 2023-11-21 MED ORDER — OXYCODONE HCL 5 MG PO TABS: 5.0000 mg | ORAL_TABLET | ORAL | Status: DC | PRN

## 2023-11-21 MED ORDER — OXYCODONE HCL 5 MG PO TABS
5.0000 mg | ORAL_TABLET | Freq: Once | ORAL | Status: AC | PRN
  Administered 2023-11-21: 5 mg via ORAL

## 2023-11-21 MED ORDER — TRANEXAMIC ACID-NACL 1000-0.7 MG/100ML-% IV SOLN
INTRAVENOUS | Status: AC
  Filled 2023-11-21: qty 100

## 2023-11-21 MED ORDER — INSULIN ASPART 100 UNIT/ML IJ SOLN
0.0000 [IU] | Freq: Three times a day (TID) | INTRAMUSCULAR | Status: DC
  Administered 2023-11-22: 5 [IU] via SUBCUTANEOUS
  Administered 2023-11-22: 2 [IU] via SUBCUTANEOUS
  Filled 2023-11-21 (×2): qty 1

## 2023-11-21 MED ORDER — GABAPENTIN 300 MG PO CAPS
ORAL_CAPSULE | ORAL | Status: AC
  Filled 2023-11-21: qty 1

## 2023-11-21 MED ORDER — ONDANSETRON HCL 4 MG PO TABS: 4.0000 mg | ORAL_TABLET | Freq: Four times a day (QID) | ORAL | Status: DC | PRN

## 2023-11-21 MED ORDER — DEXAMETHASONE SODIUM PHOSPHATE 10 MG/ML IJ SOLN
INTRAMUSCULAR | Status: AC
  Filled 2023-11-21: qty 1

## 2023-11-21 MED ORDER — LIDOCAINE HCL (CARDIAC) PF 100 MG/5ML IV SOSY
PREFILLED_SYRINGE | INTRAVENOUS | Status: DC | PRN
  Administered 2023-11-21: 60 mg via INTRAVENOUS

## 2023-11-21 MED ORDER — FLEET ENEMA RE ENEM: 1.0000 | ENEMA | Freq: Once | RECTAL | Status: DC | PRN

## 2023-11-21 MED ORDER — GLYCOPYRROLATE PF 0.2 MG/ML IJ SOSY
PREFILLED_SYRINGE | INTRAMUSCULAR | Status: DC | PRN
  Administered 2023-11-21: .2 mg via INTRAVENOUS

## 2023-11-21 MED ORDER — CALCIUM CHLORIDE 10 % IV SOLN
INTRAVENOUS | Status: AC
  Filled 2023-11-21: qty 10

## 2023-11-21 MED ORDER — AMLODIPINE BESYLATE 5 MG PO TABS
5.0000 mg | ORAL_TABLET | Freq: Every day | ORAL | Status: DC
  Administered 2023-11-21: 5 mg via ORAL
  Filled 2023-11-21: qty 1

## 2023-11-21 MED ORDER — VITAMIN D 25 MCG (1000 UNIT) PO TABS
1000.0000 [IU] | ORAL_TABLET | Freq: Every day | ORAL | Status: DC
  Administered 2023-11-22: 1000 [IU] via ORAL
  Filled 2023-11-21: qty 1

## 2023-11-21 MED ORDER — MIDAZOLAM HCL 5 MG/5ML IJ SOLN
INTRAMUSCULAR | Status: DC | PRN
  Administered 2023-11-21: 1 mg via INTRAVENOUS

## 2023-11-21 MED ORDER — ONDANSETRON HCL 4 MG/2ML IJ SOLN: 4.0000 mg | Freq: Once | INTRAMUSCULAR | Status: DC | PRN

## 2023-11-21 MED ORDER — PHENYLEPHRINE HCL-NACL 20-0.9 MG/250ML-% IV SOLN
INTRAVENOUS | Status: AC
  Filled 2023-11-21: qty 250

## 2023-11-21 MED ORDER — GENTAMICIN SULFATE 0.1 % EX CREA
1.0000 | TOPICAL_CREAM | Freq: Every day | CUTANEOUS | Status: DC
  Administered 2023-11-22: 1 via TOPICAL
  Filled 2023-11-21: qty 15

## 2023-11-21 MED ORDER — ALBUMIN HUMAN 5 % IV SOLN: INTRAVENOUS | Status: DC | PRN

## 2023-11-21 MED ORDER — CALCIUM CHLORIDE 10 % IV SOLN
INTRAVENOUS | Status: DC | PRN
  Administered 2023-11-21 (×2): .5 g via INTRAVENOUS

## 2023-11-21 MED ORDER — INSULIN GLARGINE 100 UNIT/ML ~~LOC~~ SOLN
16.0000 [IU] | Freq: Every evening | SUBCUTANEOUS | Status: DC
  Filled 2023-11-21: qty 0.16

## 2023-11-21 MED ORDER — ONDANSETRON HCL 4 MG/2ML IJ SOLN
INTRAMUSCULAR | Status: AC
  Filled 2023-11-21: qty 2

## 2023-11-21 MED ORDER — PROPOFOL 10 MG/ML IV BOLUS
INTRAVENOUS | Status: DC | PRN
  Administered 2023-11-21: 10 mg via INTRAVENOUS
  Administered 2023-11-21: 20 mg via INTRAVENOUS
  Administered 2023-11-21: 10 mg via INTRAVENOUS
  Administered 2023-11-21: 80 ug/kg/min via INTRAVENOUS
  Administered 2023-11-21: 125 ug/kg/min via INTRAVENOUS

## 2023-11-21 MED ORDER — PROPOFOL 1000 MG/100ML IV EMUL
INTRAVENOUS | Status: AC
  Filled 2023-11-21: qty 100

## 2023-11-21 MED ORDER — MIDAZOLAM HCL 2 MG/2ML IJ SOLN
INTRAMUSCULAR | Status: AC
Start: 2023-11-21 — End: ?
  Filled 2023-11-21: qty 2

## 2023-11-21 MED ORDER — TORSEMIDE 20 MG PO TABS
20.0000 mg | ORAL_TABLET | Freq: Four times a day (QID) | ORAL | Status: DC
  Administered 2023-11-21 – 2023-11-22 (×2): 20 mg via ORAL
  Filled 2023-11-21 (×2): qty 1

## 2023-11-21 MED ORDER — SODIUM CHLORIDE 0.9 % IV SOLN: INTRAVENOUS | Status: DC

## 2023-11-21 MED ORDER — ACETAMINOPHEN 10 MG/ML IV SOLN: 1000.0000 mg | Freq: Once | INTRAVENOUS | Status: DC | PRN

## 2023-11-21 MED ORDER — TRAMADOL HCL 50 MG PO TABS
ORAL_TABLET | ORAL | Status: AC
  Filled 2023-11-21: qty 1

## 2023-11-21 MED ORDER — ACETAMINOPHEN 10 MG/ML IV SOLN
INTRAVENOUS | Status: DC | PRN
  Administered 2023-11-21: 1000 mg via INTRAVENOUS

## 2023-11-21 MED ORDER — ALBUTEROL SULFATE (2.5 MG/3ML) 0.083% IN NEBU: 2.5000 mg | INHALATION_SOLUTION | Freq: Four times a day (QID) | RESPIRATORY_TRACT | Status: DC | PRN

## 2023-11-21 MED ORDER — PANTOPRAZOLE SODIUM 40 MG PO TBEC
40.0000 mg | DELAYED_RELEASE_TABLET | Freq: Two times a day (BID) | ORAL | Status: DC
  Administered 2023-11-21 – 2023-11-22 (×2): 40 mg via ORAL
  Filled 2023-11-21 (×2): qty 1

## 2023-11-21 MED ORDER — CALCITRIOL 0.25 MCG PO CAPS
0.2500 ug | ORAL_CAPSULE | Freq: Every day | ORAL | Status: DC
  Administered 2023-11-22: 0.25 ug via ORAL
  Filled 2023-11-21: qty 1

## 2023-11-21 MED ORDER — SENNOSIDES-DOCUSATE SODIUM 8.6-50 MG PO TABS
1.0000 | ORAL_TABLET | Freq: Two times a day (BID) | ORAL | Status: DC
  Administered 2023-11-22: 1 via ORAL
  Filled 2023-11-21 (×2): qty 1

## 2023-11-21 MED ORDER — PHENYLEPHRINE HCL-NACL 20-0.9 MG/250ML-% IV SOLN
INTRAVENOUS | Status: DC | PRN
  Administered 2023-11-21: 10 ug/min via INTRAVENOUS
  Administered 2023-11-21 (×2): 160 ug via INTRAVENOUS

## 2023-11-21 SURGICAL SUPPLY — 47 items
BLADE CLIPPER SURG (BLADE) IMPLANT
BLADE SAW 90X25X1.19 OSCILLAT (BLADE) ×1 IMPLANT
BRUSH SCRUB EZ PLAIN DRY (MISCELLANEOUS) ×1 IMPLANT
DRAPE 3/4 80X56 (DRAPES) ×1 IMPLANT
DRAPE INCISE IOBAN 66X60 STRL (DRAPES) ×1 IMPLANT
DRSG AQUACEL AG ADV 3.5X14 (GAUZE/BANDAGES/DRESSINGS) ×1 IMPLANT
DRSG MEPILEX SACRM 8.7X9.8 (GAUZE/BANDAGES/DRESSINGS) ×1 IMPLANT
DRSG TEGADERM 4X4.75 (GAUZE/BANDAGES/DRESSINGS) ×1 IMPLANT
DURAPREP 26ML APPLICATOR (WOUND CARE) ×2 IMPLANT
ELECT CAUTERY BLADE 6.4 (BLADE) ×1 IMPLANT
ELECT REM PT RETURN 9FT ADLT (ELECTROSURGICAL) ×1
ELECTRODE REM PT RTRN 9FT ADLT (ELECTROSURGICAL) ×1 IMPLANT
EVACUATOR 1/8 PVC DRAIN (DRAIN) ×1 IMPLANT
GLOVE BIOGEL M STRL SZ7.5 (GLOVE) ×6 IMPLANT
GLOVE SURG UNDER POLY LF SZ8 (GLOVE) ×2 IMPLANT
GOWN STRL REUS W/ TWL LRG LVL3 (GOWN DISPOSABLE) ×2 IMPLANT
GOWN STRL REUS W/ TWL XL LVL3 (GOWN DISPOSABLE) ×1 IMPLANT
GOWN TOGA ZIPPER T7+ PEEL AWAY (MISCELLANEOUS) ×1 IMPLANT
HANDLE YANKAUER SUCT OPEN TIP (MISCELLANEOUS) ×1 IMPLANT
HEAD FEM STD 32X+1 STRL (Hips) IMPLANT
HOLDER FOLEY CATH W/STRAP (MISCELLANEOUS) ×1 IMPLANT
HOOD PEEL AWAY T7 (MISCELLANEOUS) ×1 IMPLANT
IV NS IRRIG 3000ML ARTHROMATIC (IV SOLUTION) ×1 IMPLANT
KIT PEG BOARD PINK (KITS) ×1 IMPLANT
KIT TURNOVER KIT A (KITS) ×1 IMPLANT
LINER MARATHON 10 DEG 32MMX450 (Hips) ×1 IMPLANT
LINER MARATHON 10D 32MMX450 (Hips) IMPLANT
MANIFOLD NEPTUNE II (INSTRUMENTS) ×2 IMPLANT
NS IRRIG 500ML POUR BTL (IV SOLUTION) ×1 IMPLANT
PACK HIP PROSTHESIS (MISCELLANEOUS) ×1 IMPLANT
PIN SECT CUP 50MM (Hips) IMPLANT
PIN STEIN THRED 5/32 (Pin) ×1 IMPLANT
PULSAVAC PLUS IRRIG FAN TIP (DISPOSABLE) ×1
SOLUTION IRRIG SURGIPHOR (IV SOLUTION) ×1 IMPLANT
SPONGE DRAIN TRACH 4X4 STRL 2S (GAUZE/BANDAGES/DRESSINGS) ×1 IMPLANT
STAPLER SKIN PROX 35W (STAPLE) ×1 IMPLANT
STEM FEMORAL SZ 5MM STD ACTIS (Stem) IMPLANT
SUT ETHIBOND #5 BRAIDED 30INL (SUTURE) ×1 IMPLANT
SUT VIC AB 0 CT1 36 (SUTURE) ×2 IMPLANT
SUT VIC AB 1 CT1 36 (SUTURE) ×2 IMPLANT
SUT VIC AB 2-0 CT1 TAPERPNT 27 (SUTURE) ×1 IMPLANT
TAPE CLOTH 3X10 WHT NS LF (GAUZE/BANDAGES/DRESSINGS) ×1 IMPLANT
TIP FAN IRRIG PULSAVAC PLUS (DISPOSABLE) ×1 IMPLANT
TOWEL OR 17X26 4PK STRL BLUE (TOWEL DISPOSABLE) IMPLANT
TRAP FLUID SMOKE EVACUATOR (MISCELLANEOUS) ×1 IMPLANT
TRAY FOLEY MTR SLVR 16FR STAT (SET/KITS/TRAYS/PACK) ×1 IMPLANT
WATER STERILE IRR 1000ML POUR (IV SOLUTION) ×1 IMPLANT

## 2023-11-21 NOTE — Anesthesia Postprocedure Evaluation (Signed)
Anesthesia Post Note  Patient: Alexandria Moody  Procedure(s) Performed: TOTAL HIP ARTHROPLASTY (Right: Hip)  Patient location during evaluation: PACU Anesthesia Type: Spinal Level of consciousness: oriented and awake and alert Pain management: pain level controlled Vital Signs Assessment: post-procedure vital signs reviewed and stable Respiratory status: spontaneous breathing, respiratory function stable and patient connected to nasal cannula oxygen Cardiovascular status: blood pressure returned to baseline and stable Postop Assessment: no headache, no backache, no apparent nausea or vomiting and spinal receding Anesthetic complications: no   No notable events documented.   Last Vitals:  Vitals:   11/21/23 1230 11/21/23 1237  BP: 110/63   Pulse: 62 74  Resp: 13 16  Temp:    SpO2: 99% 100%    Last Pain:  Vitals:   11/21/23 1237  TempSrc:   PainSc: 1                  Reed Breech

## 2023-11-21 NOTE — Interval H&P Note (Signed)
History and Physical Interval Note:  11/21/2023 7:26 AM  Alexandria Moody  has presented today for surgery, with the diagnosis of PRIMARY OSTEOARTHRITIS OF RIGHT HIP.  The various methods of treatment have been discussed with the patient and family. After consideration of risks, benefits and other options for treatment, the patient has consented to  Procedure(s): TOTAL HIP ARTHROPLASTY (Right) as a surgical intervention.  The patient's history has been reviewed, patient examined, no change in status, stable for surgery.  I have reviewed the patient's chart and labs.  Questions were answered to the patient's satisfaction.     Rahil Passey P Ezequias Lard

## 2023-11-21 NOTE — Evaluation (Signed)
Physical Therapy Evaluation Patient Details Name: Alexandria Moody MRN: [id number] DOB: February 14, 1946 Today's Date: 11/21/2023  History of Present Illness  Patient is a 77 year old female with degenerative arthrosis of the right hip s/p right total hip arthroplasty.  Clinical Impression  Patient is agreeable to PT evaluation. She reports she had a recent fall at home. She is ambulatory with her 4 wheeled walker at baseline and lives alone. Her daughter is very supportive and can help as needed per patient report.  Today, the patient was able to stand and gait training initiated. Patient walked in the hallway with rolling walker with supervision. No dizziness reported with upright activity. Reviewed posterior hip precautions and handout provided. Recommend to continue PT to maximize independence. Patient is hopeful to return home tomorrow.       If plan is discharge home, recommend the following: Assist for transportation   Can travel by private vehicle        Equipment Recommendations None recommended by PT  Recommendations for Other Services       Functional Status Assessment Patient has had a recent decline in their functional status and demonstrates the ability to make significant improvements in function in a reasonable and predictable amount of time.     Precautions / Restrictions Precautions Precautions: Posterior Hip;Fall Precaution Booklet Issued: Yes (comment) Restrictions Weight Bearing Restrictions: Yes RLE Weight Bearing: Weight bearing as tolerated      Mobility  Bed Mobility Overal bed mobility: Needs Assistance Bed Mobility: Supine to Sit, Sit to Supine     Supine to sit: Min assist, Used rails, HOB elevated Sit to supine: Min assist, HOB elevated, Used rails   General bed mobility comments: assistance for RLE support. patient had pain with R hip abduction, attempting to sit on right side of bed. significant less pain reported when getting up on left side of bed,  cues for technique    Transfers Overall transfer level: Needs assistance Equipment used: Rolling walker (2 wheels) Transfers: Sit to/from Stand Sit to Stand: Min assist           General transfer comment: Min A for lifting assistance. cues for technique    Ambulation/Gait Ambulation/Gait assistance: Supervision Gait Distance (Feet): 25 Feet Assistive device: Rolling walker (2 wheels) Gait Pattern/deviations: Step-through pattern, Decreased stride length Gait velocity: decreased     General Gait Details: patient ambulated in the unit with rolling walker and supervision for safety. no loss of balance. no dizziness reported  Stairs            Wheelchair Mobility     Tilt Bed    Modified Rankin (Stroke Patients Only)       Balance Overall balance assessment: Needs assistance Sitting-balance support: Feet supported Sitting balance-Leahy Scale: Good     Standing balance support: Bilateral upper extremity supported Standing balance-Leahy Scale: Fair Standing balance comment: using RW for support in standing                             Pertinent Vitals/Pain Pain Assessment Pain Assessment: Faces Faces Pain Scale: Hurts whole lot Pain Location: R hip with movement initially Pain Descriptors / Indicators: Dull, Grimacing, Guarding Pain Intervention(s): Limited activity within patient's tolerance, Monitored during session, Repositioned, Ice applied    Home Living Family/patient expects to be discharged to:: Private residence Living Arrangements: Alone Available Help at Discharge: Family;Available 24 hours/day Type of Home: Apartment Home Access: Level entry  Home Layout: One level Home Equipment: Agricultural consultant (2 wheels);Rollator (4 wheels);BSC/3in1;Shower seat;Grab bars - tub/shower;Adaptive equipment      Prior Function Prior Level of Function : Independent/Modified Independent;Driving;History of Falls (last six months)              Mobility Comments: Mod I with rollator for ambulation. history of recent falls ADLs Comments: Mod I     Extremity/Trunk Assessment   Upper Extremity Assessment Upper Extremity Assessment: Overall WFL for tasks assessed    Lower Extremity Assessment Lower Extremity Assessment: RLE deficits/detail RLE Deficits / Details: patient is able to activate hip/knee/ankle movement. no knee buckling with weight bearing RLE Sensation: WNL       Communication   Communication Communication: No apparent difficulties  Cognition Arousal: Alert Behavior During Therapy: WFL for tasks assessed/performed Overall Cognitive Status: Within Functional Limits for tasks assessed                                          General Comments General comments (skin integrity, edema, etc.): HEP issued and reviewed posterior hip precuations    Exercises     Assessment/Plan    PT Assessment Patient needs continued PT services  PT Problem List Decreased strength;Decreased range of motion;Decreased activity tolerance;Decreased balance;Decreased mobility;Pain;Decreased safety awareness;Decreased knowledge of precautions       PT Treatment Interventions DME instruction;Gait training;Stair training;Functional mobility training;Therapeutic activities;Therapeutic exercise;Balance training;Neuromuscular re-education;Cognitive remediation;Patient/family education    PT Goals (Current goals can be found in the Care Plan section)  Acute Rehab PT Goals Patient Stated Goal: to return home PT Goal Formulation: With patient Time For Goal Achievement: 12/05/23 Potential to Achieve Goals: Good    Frequency BID     Co-evaluation               AM-PAC PT "6 Clicks" Mobility  Outcome Measure Help needed turning from your back to your side while in a flat bed without using bedrails?: A Little Help needed moving from lying on your back to sitting on the side of a flat bed without using  bedrails?: A Little Help needed moving to and from a bed to a chair (including a wheelchair)?: A Little Help needed standing up from a chair using your arms (e.g., wheelchair or bedside chair)?: A Little Help needed to walk in hospital room?: A Little Help needed climbing 3-5 steps with a railing? : A Little 6 Click Score: 18    End of Session Equipment Utilized During Treatment: Gait belt Activity Tolerance: Patient tolerated treatment well Patient left: in bed;with call bell/phone within reach;with SCD's reapplied (ice pack R hip) Nurse Communication: Mobility status PT Visit Diagnosis: Difficulty in walking, not elsewhere classified (R26.2);Other abnormalities of gait and mobility (R26.89)    Time: 0981-1914 PT Time Calculation (min) (ACUTE ONLY): 24 min   Charges:   PT Evaluation $PT Eval Low Complexity: 1 Low PT Treatments $Gait Training: 8-22 mins PT General Charges $$ ACUTE PT VISIT: 1 Visit         Donna Bernard, PT, MPT   Ina Homes 11/21/2023, 4:18 PM

## 2023-11-21 NOTE — Anesthesia Preprocedure Evaluation (Addendum)
Anesthesia Evaluation  Patient identified by MRN, date of birth, ID band Patient awake    Reviewed: Allergy & Precautions, NPO status , Patient's Chart, lab work & pertinent test results  History of Anesthesia Complications Negative for: history of anesthetic complications  Airway Mallampati: IV   Neck ROM: Full    Dental  (+) Missing   Pulmonary COPD, former smoker (quit 2000)   Pulmonary exam normal breath sounds clear to auscultation       Cardiovascular hypertension, + CAD (s/p CABG), + Peripheral Vascular Disease (carotid disease) and +CHF  Normal cardiovascular exam+ Valvular Problems/Murmurs MVP  Rhythm:Regular Rate:Normal  ECG 10/10/23: NSR; minimal LVH; possible anterior infarct, age undetermined  Echo 01/25/23:  1. Left ventricular ejection fraction, by estimation, is 55 to 60%. The left ventricle has normal function. The left ventricle has no regional wall motion abnormalities. Left ventricular diastolic parameters are consistent with Grade I diastolic  dysfunction (impaired relaxation).  2. Right ventricular systolic function is normal. The right ventricular size is normal.  3. The mitral valve is normal in structure. Mild mitral valve regurgitation.  4. The aortic valve is normal in structure. Aortic valve regurgitation is not visualized   Myocardial perfusion 10/12/22: Normal Lexiscan infusion EKG  Normal myocardial perfusion without evidence of myocardial ischemia     Neuro/Psych  PSYCHIATRIC DISORDERS  Depression    TIA (2019)   GI/Hepatic   Endo/Other  diabetes, Type 2Hypothyroidism    Renal/GU ESRF and DialysisRenal disease (last HD 11/19/23)     Musculoskeletal  (+) Arthritis ,    Abdominal   Peds  Hematology  (+) Blood dyscrasia, anemia   Anesthesia Other Findings Reviewed and agree with Edd Fabian pre-anesthesia clinical review note.    Cardiology note 07/25/23:  1. Hypertension: Currently  stable in the office today at 120/60 -Continue current management with losartan, amlodipine  2. Chronic diastolic CHF NYHA class II: Currently stable with reassuring echo and no significant new symptoms -Continue current management with losartan, hydralazine  3. Coronary artery disease: Currently stable with reassuring lexiscan and no angina  -Continue daily aspirin, rosuvastatin for secondary ASCVD risk reduction -Continue amlodipine, losartan for additional CV risk reduction  4. Peripheral vascular disease: With bilateral carotid artery stenosis less than 50% bilaterally 2021. Patient has no new symptoms of TIA/stroke at this time. Patient also with atherosclerosis of abdominal aorta. -Continue daily aspirin, rosuvastatin for secondary ASCVD risk reduction -Continue hydralazine, losartan for additional CV risk reduction  5. Hyperlipidemia: Currently on rosuvastatin 40 mg daily with no reported side effects to medical management at this time. -No changes to management at this time  6. Bradycardia: Resolved after discontinuing carvedilol  No orders of the defined types were placed in this encounter.  Return in about 6 months (around 01/25/2024).    Reproductive/Obstetrics                             Anesthesia Physical Anesthesia Plan  ASA: 3  Anesthesia Plan: General and Spinal   Post-op Pain Management:    Induction: Intravenous  PONV Risk Score and Plan: 3 and Propofol infusion, TIVA, Treatment may vary due to age or medical condition and Ondansetron  Airway Management Planned: Natural Airway and Nasal Cannula  Additional Equipment:   Intra-op Plan:   Post-operative Plan:   Informed Consent: I have reviewed the patients History and Physical, chart, labs and discussed the procedure including the risks, benefits and alternatives for  the proposed anesthesia with the patient or authorized representative who has indicated his/her understanding and  acceptance.       Plan Discussed with: CRNA  Anesthesia Plan Comments: (Plan for spinal and GA with natural airway, LMA/GETA backup.  Patient consented for risks of anesthesia including but not limited to:  - adverse reactions to medications - damage to eyes, teeth, lips or other oral mucosa - nerve damage due to positioning  - sore throat or hoarseness - headache, bleeding, infection, nerve damage 2/2 spinal - damage to heart, brain, nerves, lungs, other parts of body or loss of life  Informed patient about role of CRNA in peri- and intra-operative care.  Patient voiced understanding.)        Anesthesia Quick Evaluation

## 2023-11-21 NOTE — Discharge Summary (Signed)
Physician Discharge Summary  Subjective: Day of Surgery Procedure(s) (LRB): TOTAL HIP ARTHROPLASTY (Right) Patient reports pain as mild.   Patient seen in rounds with Dr. Ernest Pine. Patient is well, and has had no acute complaints or problems. Denies any CP, SOB, N/V, fevers or chills We will continue with therapy today.  Patient is ready to go home  Physician Discharge Summary  Patient ID: Alexandria Moody MRN: [id number] DOB/AGE: 75/24/1947 75 y.o.  Admit date: 11/21/2023 Discharge date: 11/21/2023  Admission Diagnoses:  Discharge Diagnoses:  Principal Problem:   Hx of total hip arthroplasty, right   Discharged Condition: good  Hospital Course: Patient presented to the hospital on 11/21/2023 for an elective Right THA performed by Dr. Ernest Pine. Patient was given 1g of TXA and 2g of Ancef prior to the procedure. She tolerated the procedure well without any complications. See procedural note below for details. Postoperatively, the patient did very well. She was able to pass PT protocols on post-op day one without any issues. JP drain was removed without any difficulty and was intact. she was able to void her bladder without any difficulty. Physical exam was unremarkable. she denies any SOB, CP, N/V, fevers or chills. Vital signs are stable. Patient is stable to discharge home.   Treatments: none  Discharge Exam: Blood pressure (!) 144/61, pulse (!) 59, temperature 98 F (36.7 C), resp. rate 18, height 5\' 3"  (1.6 m), weight 67.6 kg, SpO2 99%.   Disposition:    Allergies as of 11/21/2023       Reactions   Tape    Tears skin     Med Rec must be completed prior to using this [facility]***        Durable Medical Equipment  (From admission, onward)           Start     Ordered   11/21/23 1720  DME Walker rolling  Once       Question:  Patient needs a walker to treat with the following condition  Answer:  S/P total hip arthroplasty   11/21/23 1719   11/21/23 1720  DME Bedside  commode  Once       Comments: Patient is not able to walk the distance required to go the bathroom, or he/she is unable to safely negotiate stairs required to access the bathroom.  A 3in1 BSC will alleviate this problem  Question:  Patient needs a bedside commode to treat with the following condition  Answer:  S/P total hip arthroplasty   11/21/23 1719            Follow-up Information     Hooten, Illene Labrador, MD Follow up on 01/03/2024.   Specialty: Orthopedic Surgery Why: at 2:151pm Contact information: [address] [facility] [place] [address] [phone]                 Signed: Gean Birchwood 11/21/2023, 5:43 PM   Objective: Vital signs in last 24 hours: Temp:  [97.1 F (36.2 C)-98 F (36.7 C)] 98 F (36.7 C) (12/04 1722) Pulse Rate:  [59-81] 59 (12/04 1722) Resp:  [12-20] 18 (12/04 1722) BP: (110-147)/(51-68) 144/61 (12/04 1722) SpO2:  [95 %-100 %] 99 % (12/04 1722) Weight:  [67.6 kg] 67.6 kg (12/04 0753)  Intake/Output from previous day:  Intake/Output Summary (Last 24 hours) at 11/21/2023 1743 Last data filed at 11/21/2023 1136 Gross per 24 hour  Intake 800 ml  Output 150 ml  Net 650 ml    Intake/Output this shift:  Total I/O In: 800 [I.V.:350; IV Piggyback:450] Out: 150 [Urine:100; Blood:50]  Labs: Recent Labs    11/21/23 0744  HGB 10.9*   Recent Labs    11/21/23 0744  HCT 32.0*   Recent Labs    11/21/23 0744  NA 138  K 3.9  CL 102  BUN 43*  CREATININE 2.90*  GLUCOSE 107*   No results for input(s): "LABPT", "INR" in the last 72 hours.  EXAM: General - Patient is Alert, Appropriate, and Oriented Extremity - Neurologically intact ABD soft Neurovascular intact Sensation intact distally Intact pulses distally Dorsiflexion/Plantar flexion intact No cellulitis present Compartment soft Dressing - dressing C/D/I and no drainage Motor Function - intact, moving foot and toes well on exam. Able to plantar and dorsi  flex with good strength and ROM. Neurovascularly intact to all dermatomes down her RLE. JP Drain pulled without difficulty. Intact  Assessment/Plan: Day of Surgery Procedure(s) (LRB): TOTAL HIP ARTHROPLASTY (Right) Procedure(s) (LRB): TOTAL HIP ARTHROPLASTY (Right) Past Medical History:  Diagnosis Date   Adenomatous polyp of colon    Anemia of chronic renal failure    a.) recieving iron infusions + epoetin alfa-epbx (Retacrit)   Anginal pain (HCC)    Aortic atherosclerosis (HCC)    Arthritis    Atherosclerosis of native arteries of extremity with intermittent claudication (HCC)    a.) ABI/TBI 06/24/2021: 0.95 RIGHT, 0.72 LEFT   Beta-blockers contraindicated    a.) result in significant bradycardia; previously on carvedilol as Rx'd by cardiology -- disontinued due to symptomatic bradycardia   Bilateral carotid artery disease (HCC) 07/25/2021   a.) carotid doppler 07/25/2021: < 50% RICA, < 50% LICA.   Bradycardia    CHF (congestive heart failure) (HCC)    a.) TTE 05/12/2020: LVEF 55-60%; mild LA enlargement; trivial to mild MR; G1DD; b.) TTE 01/25/2023: EF 55-60%, mild MR, G1DD.   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   a.) smolding phase ANCA associated pauci immune   Heart murmur    HLD (hyperlipidemia)    Hypertension    Hypothyroidism    Insomnia    a.) uses melatonin PRN   Interstitial lung disease (HCC)    Long term current use of immunosuppressive drug    a.) hydroxychloriquine for ANCA (+) glomerulonephritis   Long-term use of  aspirin therapy    Macular degeneration    MDD (major depressive disorder)    Osteoporosis    a.) on denosumab injections   PAD (peripheral artery disease) (HCC)    Pneumonia 11/2021   S/P CABG x 3 2002   a.) LIMA-LAD, SVG-diagnoal, SVG-OM   Seborrheic dermatitis    SNHL (sensorineural hearing loss)    Squamous cell skin cancer    legs, hands, scalp   TIA (transient ischemic attack) 2019   Type 2 diabetes mellitus with chronic kidney disease on chronic dialysis, with long-term current use of insulin (HCC)    Vitamin D deficiency    Principal Problem:   Hx of total hip arthroplasty, right  Estimated body mass index is 26.39 kg/m as calculated  from the following:   Height as of this encounter: 5\' 3"  (1.6 m).   Weight as of this encounter: 67.6 kg.  Patient will continue to work with Home health physical therapy on gait, ROM and strengthening   Hip Preacutions   Discussed with the patient continuing to utilize ice over the bandage   Patient will wear TED hose bilaterally to help prevent DVT and clot formation   Discussed the Aquacel bandage.  This bandage will stay in place 7 days postoperatively.  Can be replaced with honeycomb bandages that will be sent home with the patient   Discussed sending the patient home with tramadol and oxycodone for as needed pain management.  Patient will also be sent home with Celebrex to help with swelling and inflammation.  Patient will take an 81 mg aspirin twice daily for DVT prophylaxis   JP drain removed without difficulty, intact   Weight-Bearing as tolerated to right leg   Patient will follow-up with [facility] orthopedics in 6 weeks for re-imaging and reevaluation  Diet - Cardiac diet Follow up - in 6 weeks Activity - WBAT Disposition - Home Condition Upon Discharge - Good DVT Prophylaxis - Aspirin and TED hose  Danise Edge, PA-C Orthopaedic Surgery 11/21/2023, 5:43 PM

## 2023-11-21 NOTE — Op Note (Signed)
OPERATIVE NOTE  DATE OF SURGERY:  11/21/2023  PATIENT NAME:  Alexandria Moody   DOB: 1946-08-06  MRN: [id number]  PRE-OPERATIVE DIAGNOSIS: Degenerative arthrosis of the right hip, primary  POST-OPERATIVE DIAGNOSIS:  Same  PROCEDURE:  Right total hip arthroplasty  SURGEON:  Jena Gauss. M.D.  ASSISTANT:  Gean Birchwood, PA-C (present and scrubbed throughout the case, critical for assistance with exposure, retraction, instrumentation, and closure)  ANESTHESIA: spinal  ESTIMATED BLOOD LOSS: 50 mL  FLUIDS REPLACED: 450 mL of crystalloid  DRAINS: 2 medium Hemovac drains  IMPLANTS UTILIZED: DePuy size 5 standard offset Actis femoral stem, 50 mm OD Pinnacle 100 acetabular component, +4 mm 10 degree Pinnacle Marathon polyethylene insert, and a 32 mm CoCr +1 mm hip ball  INDICATIONS FOR SURGERY: Merida Slates is a 77 y.o. year old female with a long history of progressive hip and groin  pain. X-rays demonstrated severe degenerative changes. The patient had not seen any significant improvement despite conservative nonsurgical intervention. After discussion of the risks and benefits of surgical intervention, the patient expressed understanding of the risks benefits and agree with plans for total hip arthroplasty.   The risks, benefits, and alternatives were discussed at length including but not limited to the risks of infection, bleeding, nerve injury, stiffness, blood clots, the need for revision surgery, limb length inequality, dislocation, cardiopulmonary complications, among others, and they were willing to proceed.  PROCEDURE IN DETAIL: The patient was brought into the operating room and, after adequate spinal anesthesia was achieved, the patient was placed in a left lateral decubitus position. Axillary roll was placed and all bony prominences were well-padded. The patient's right hip was cleaned and prepped with alcohol and DuraPrep and draped in the usual sterile fashion. A "timeout" was  performed as per usual protocol. A lateral curvilinear incision was made gently curving towards the posterior superior iliac spine. The IT band was incised in line with the skin incision and the fibers of the gluteus maximus were split in line. The piriformis tendon was identified, skeletonized, and incised at its insertion to the proximal femur and reflected posteriorly. A T type posterior capsulotomy was performed. Prior to dislocation of the femoral head, a threaded Steinmann pin was inserted through a separate stab incision into the pelvis superior to the acetabulum and bent in the form of a stylus so as to assess limb length and hip offset throughout the procedure. The femoral head was then dislocated posteriorly. Inspection of the femoral head demonstrated severe degenerative changes with full-thickness loss of articular cartilage. The femoral neck cut was performed using an oscillating saw. The anterior capsule was elevated off of the femoral neck using a periosteal elevator. Attention was then directed to the acetabulum. The remnant of the labrum was excised using electrocautery. Inspection of the acetabulum also demonstrated significant degenerative changes. The acetabulum was reamed in sequential fashion up to a 49 mm diameter. Good punctate bleeding bone was encountered. A 50 mm Pinnacle 100 acetabular component was positioned and impacted into place. Good scratch fit was appreciated. A +4 mm neutral polyethylene trial was inserted.  Attention was then directed to the proximal femur.  Femoral broaches were inserted in a sequential fashion up to a size 5 broach. Calcar region was planed and a trial reduction was performed using a standard offset neck and a 32 mm hip ball with a +1 mm neck length.  Reasonably good stability was noted.  It was elected to trial with a +4 mm 10 degree polyethylene  trial with the high side position at the 8 o'clock position.  Good equalization of limb lengths and hip offset  was appreciated and excellent stability was noted both anteriorly and posteriorly. Trial components were removed. The acetabular shell was irrigated with copious amounts of normal saline with antibiotic solution and suctioned dry. A +4 mm 10 degree Pinnacle Marathon polyethylene insert was positioned with the high side at the 8 o'clock position and impacted into place. Next, a size 5 standard offset Actis femoral stem was positioned and impacted into place. Excellent scratch fit was appreciated. A trial reduction was again performed with a 32 mm hip ball with a +1 mm neck length. Again, good equalization of limb lengths was appreciated and excellent stability appreciated both anteriorly and posteriorly. The hip was then dislocated and the trial hip ball was removed. The Morse taper was cleaned and dried. A 32 mm cobalt chrome hip ball with a +1 mm neck length was placed on the trunnion and impacted into place. The hip was then reduced and placed through range of motion. Excellent stability was appreciated both anteriorly and posteriorly.  The wound was irrigated with copious amounts of normal saline followed by 450 ml of Surgiphor and suctioned dry. Good hemostasis was appreciated. The posterior capsulotomy was repaired using #5 Ethibond. Piriformis tendon was reapproximated to the undersurface of the gluteus medius tendon using #5 Ethibond. The IT band was reapproximated using interrupted sutures of #1 Vicryl. Subcutaneous tissue was approximated using first #0 Vicryl followed by #2-0 Vicryl. The skin was closed with skin staples.  The patient tolerated the procedure well and was transported to the recovery room in stable condition.   Jena Gauss., M.D.

## 2023-11-21 NOTE — Progress Notes (Signed)
Peritoneal Dialysis Treatment Initiation Note Pre Treatment Weight: 68.7 kg  Consent signed and in chart.  PD treatment initiated via aseptic technique.   Patient is awake and alert. No complaints of pain.   PD exit site clean, dry and intact.  New dressing applied.   Hand-off given to the patient's nurse.  Education provided to dept staff  regarding PD machine and how  to contact tech support if machine  alarms.    Ralene Muskrat RN [facility]

## 2023-11-21 NOTE — Progress Notes (Signed)
Subjective: Day of Surgery Procedure(s) (LRB): TOTAL HIP ARTHROPLASTY (Right) Patient reports pain as mild.   Patient seen in rounds with Dr. Ernest Pine. Patient is well, and has had no acute complaints or problems. Denies any CP, SOB, N/V, fevers or chills We will start therapy today.  Plan is to go Home after hospital stay.  Objective: Vital signs in last 24 hours: Temp:  [97.1 F (36.2 C)-98 F (36.7 C)] 98 F (36.7 C) (12/04 1722) Pulse Rate:  [59-81] 59 (12/04 1722) Resp:  [12-20] 18 (12/04 1722) BP: (110-147)/(51-68) 144/61 (12/04 1722) SpO2:  [95 %-100 %] 99 % (12/04 1722) Weight:  [67.6 kg] 67.6 kg (12/04 0753)  Intake/Output from previous day:  Intake/Output Summary (Last 24 hours) at 11/21/2023 1740 Last data filed at 11/21/2023 1136 Gross per 24 hour  Intake 800 ml  Output 150 ml  Net 650 ml    Intake/Output this shift: Total I/O In: 800 [I.V.:350; IV Piggyback:450] Out: 150 [Urine:100; Blood:50]  Labs: Recent Labs    11/21/23 0744  HGB 10.9*   Recent Labs    11/21/23 0744  HCT 32.0*   Recent Labs    11/21/23 0744  NA 138  K 3.9  CL 102  BUN 43*  CREATININE 2.90*  GLUCOSE 107*   No results for input(s): "LABPT", "INR" in the last 72 hours.  EXAM General - Patient is Alert, Appropriate, and Oriented Extremity - Neurologically intact ABD soft Neurovascular intact Sensation intact distally Intact pulses distally Dorsiflexion/Plantar flexion intact No cellulitis present Compartment soft Dressing - dressing C/D/I and no drainage Motor Function - intact, moving foot and toes well on exam. Able to plantar and dorsi flex with good strength and ROM. Neurovascularly intact to all dermatomes down her RLE. JP Drain remains in place, will look to pull later today  Past Medical History:  Diagnosis Date   Adenomatous polyp of colon    Anemia of chronic renal failure    a.) recieving iron infusions + epoetin alfa-epbx (Retacrit)   Anginal pain (HCC)     Aortic atherosclerosis (HCC)    Arthritis    Atherosclerosis of native arteries of extremity with intermittent claudication (HCC)    a.) ABI/TBI 06/24/2021: 0.95 RIGHT, 0.72 LEFT   Beta-blockers contraindicated    a.) result in significant bradycardia; previously on carvedilol as Rx'd by cardiology -- disontinued due to symptomatic bradycardia   Bilateral carotid artery disease (HCC) 07/25/2021   a.) carotid doppler 07/25/2021: < 50% RICA, < 50% LICA.   Bradycardia    CHF (congestive heart failure) (HCC)    a.) TTE 05/12/2020: LVEF 55-60%; mild LA enlargement; trivial to mild MR; G1DD; b.) TTE 01/25/2023: EF 55-60%, mild MR, G1DD.   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   a.) smolding phase ANCA associated pauci immune   Heart murmur    HLD (hyperlipidemia)    Hypertension    Hypothyroidism    Insomnia    a.) uses melatonin PRN   Interstitial lung disease (HCC)    Long term current use of immunosuppressive  drug    a.) hydroxychloriquine for ANCA (+) glomerulonephritis   Long-term use of aspirin therapy    Macular degeneration    MDD (major depressive disorder)    Osteoporosis    a.) on denosumab injections   PAD (peripheral artery disease) (HCC)    Pneumonia 11/2021   S/P CABG x 3 2002   a.) LIMA-LAD, SVG-diagnoal, SVG-OM   Seborrheic dermatitis    SNHL (sensorineural hearing loss)    Squamous cell skin cancer    legs, hands, scalp   TIA (transient ischemic attack) 2019   Type 2 diabetes mellitus with chronic kidney disease on chronic  dialysis, with long-term current use of insulin (HCC)    Vitamin D deficiency     Assessment/Plan: Day of Surgery Procedure(s) (LRB): TOTAL HIP ARTHROPLASTY (Right) Principal Problem:   Hx of total hip arthroplasty, right  Estimated body mass index is 26.39 kg/m as calculated from the following:   Height as of this encounter: 5\' 3"  (1.6 m).   Weight as of this encounter: 67.6 kg. Advance diet Up with therapy  Patient will continue to work with physical therapy to pass postoperative PT protocols, ROM and strengthening  Hip Preacutions  Discussed with the patient continuing to utilize ice over the bandage  Patient will wear TED hose bilaterally to help prevent DVT and clot formation  Discussed the Aquacel bandage.  This bandage will stay in place 7 days postoperatively.  Can be replaced with honeycomb bandages that will be sent home with the patient  Discussed sending the patient home with tramadol and oxycodone for as needed pain management.  Patient will also be sent home with Celebrex to help with swelling and inflammation.  Patient will take an 81 mg aspirin twice daily for DVT prophylaxis  JP drain in place, will look to remove later today  Weight-Bearing as tolerated to right leg  Patient will follow-up with [facility] orthopedics in 6 weeks for re-imaging and reevaluation   Rayburn Go, PA-C [facility]opa[facility] 11/21/2023, 5:40 PM

## 2023-11-21 NOTE — Anesthesia Procedure Notes (Signed)
Spinal  Patient location during procedure: OR Start time: 11/21/2023 8:30 AM End time: 11/21/2023 8:34 AM Reason for block: surgical anesthesia Staffing Performed: resident/CRNA and other anesthesia staff  Resident/CRNA: Morene Crocker, CRNA Other anesthesia staff: Lianne Cure, RN Performed by: Morene Crocker, CRNA Authorized by: Reed Breech, MD   Preanesthetic Checklist Completed: patient identified, IV checked, site marked, risks and benefits discussed, surgical consent, monitors and equipment checked, pre-op evaluation and timeout performed Spinal Block Patient position: sitting Prep: Betadine Patient monitoring: heart rate, continuous pulse ox, blood pressure and cardiac monitor Approach: midline Location: L3-4 Injection technique: single-shot Needle Needle type: Whitacre and Introducer  Needle gauge: 24 G Needle length: 9 cm Assessment Events: CSF return Additional Notes Negative paresthesia. Negative blood return. Positive free-flowing CSF. Expiration date of kit checked and confirmed. Patient tolerated procedure well on first attempt, without complications.

## 2023-11-21 NOTE — Progress Notes (Signed)
[facility]  ROUNDING NOTE   Subjective:   Alexandria Moody is a 77 y.o. female with past medical conditions including arthritis, CHF, COPD, hypertension, hyperlipidemia, CAD, bilateral diabetic retinopathy, interstitial lung disease, CABG, type 2 diabetes, and end-stage renal disease on peritoneal dialysis.  Patient presents to hospital today for scheduled right hip arthroplasty.  Patient seen and evaluated in PACU.  Alert and oriented.  Denies pain at this time.  States her daughter helps her with peritoneal dialysis treatments.  Current plan is to discharge home once stable.  Labs appropriate this morning.  We have been consulted to maintain peritoneal dialysis needs.   Objective:  Vital signs in last 24 hours:  Temp:  [97.1 F (36.2 C)-97.6 F (36.4 C)] 97.6 F (36.4 C) (12/04 1149) Pulse Rate:  [61-81] 76 (12/04 1453) Resp:  [12-20] 18 (12/04 1453) BP: (110-147)/(51-68) 123/64 (12/04 1400) SpO2:  [97 %-100 %] 97 % (12/04 1453) Weight:  [67.6 kg] 67.6 kg (12/04 0753)  Weight change:  Filed Weights   11/21/23 0753  Weight: 67.6 kg    Intake/Output: No intake/output data recorded.   Intake/Output this shift:  Total I/O In: 800 [I.V.:350; IV Piggyback:450] Out: 150 [Urine:100; Blood:50]  Physical Exam: General: NAD  Head: Normocephalic, atraumatic. Moist oral mucosal membranes  Eyes: Anicteric  Lungs:  Clear to auscultation, room air  Heart: Regular rate and rhythm  Abdomen:  Soft, nontender, nondistended, PDC  Extremities: Trace peripheral edema.  Neurologic: Nonfocal, moving all four extremities  Skin: No lesions  Access: PD catheter    Basic Metabolic Panel: Recent Labs  Lab 11/21/23 0744  NA 138  K 3.9  CL 102  GLUCOSE 107*  BUN 43*  CREATININE 2.90*    Liver Function Tests: No results for input(s): "AST", "ALT", "ALKPHOS", "BILITOT", "PROT", "ALBUMIN" in the last 168 hours. No results for input(s): "LIPASE", "AMYLASE" in the last 168  hours. No results for input(s): "AMMONIA" in the last 168 hours.  CBC: Recent Labs  Lab 11/21/23 0744  HGB 10.9*  HCT 32.0*    Cardiac Enzymes: No results for input(s): "CKTOTAL", "CKMB", "CKMBINDEX", "TROPONINI" in the last 168 hours.  BNP: Invalid input(s): "POCBNP"  CBG: Recent Labs  Lab 11/21/23 1149  GLUCAP 171*    Microbiology: Results for orders placed or performed during the hospital encounter of 11/17/23  Urine Culture     Status: Abnormal   Collection Time: 11/17/23  9:12 AM   Specimen: Urine, Random  Result Value Ref Range Status   Specimen Description   Final    URINE, RANDOM Performed at [facility], [address], [place], [place] [address]    Special Requests   Final    NONE Reflexed from [id number] Performed at [facility], [address], [place], [place] [address]    Culture 40,000 COLONIES/mL ESCHERICHIA COLI (A)  Final   Report Status 11/19/2023 FINAL  Final   Organism ID, Bacteria ESCHERICHIA COLI (A)  Final      Susceptibility   Escherichia coli - MIC*    AMPICILLIN 4 SENSITIVE Sensitive     CEFAZOLIN <=4 SENSITIVE Sensitive     CEFEPIME <=0.12 SENSITIVE Sensitive     CEFTRIAXONE <=0.25 SENSITIVE Sensitive     CIPROFLOXACIN <=0.25 SENSITIVE Sensitive     GENTAMICIN <=1 SENSITIVE Sensitive     IMIPENEM <=0.25 SENSITIVE Sensitive     NITROFURANTOIN <=16 SENSITIVE Sensitive     TRIMETH/SULFA <=20 SENSITIVE Sensitive     AMPICILLIN/SULBACTAM <=  2 SENSITIVE Sensitive     PIP/TAZO <=4 SENSITIVE Sensitive ug/mL    * 40,000 COLONIES/mL ESCHERICHIA COLI    Coagulation Studies: No results for input(s): "LABPROT", "INR" in the last 72 hours.  Urinalysis: No results for input(s): "COLORURINE", "LABSPEC", "PHURINE", "GLUCOSEU", "HGBUR", "BILIRUBINUR", "KETONESUR", "PROTEINUR", "UROBILINOGEN", "NITRITE", "LEUKOCYTESUR" in the last 72 hours.  Invalid input(s): "APPERANCEUR"    Imaging: DG HIP UNILAT W OR W/O PELVIS 2-3  VIEWS RIGHT  Result Date: 11/21/2023 CLINICAL DATA:  Status post right total hip arthroplasty. EXAM: DG HIP (WITH OR WITHOUT PELVIS) 2-3V RIGHT COMPARISON:  March 28, 2023. FINDINGS: Right femoral and acetabular components are well situated. Expected postoperative changes, including surgical drains, are noted in the surrounding soft tissues. IMPRESSION: Status post right total hip arthroplasty. Electronically Signed   By: Lupita Raider M.D.   On: 11/21/2023 14:25     Medications:    sodium chloride Stopped (11/21/23 1136)   acetaminophen      ceFAZolin (ANCEF) IV 2 g (11/21/23 1449)   dialysis solution 1.5% low-MG/low-CA      chlorhexidine  60 mL Topical Once   gentamicin cream  1 Application Topical Daily   acetaminophen, fentaNYL (SUBLIMAZE) injection, ondansetron (ZOFRAN) IV, traMADol  Assessment/ Plan:  [name] is a 77 y.o.  female with past medical conditions including arthritis, CHF, COPD, hypertension, hyperlipidemia, CAD, bilateral diabetic retinopathy, interstitial lung disease, CABG, type 2 diabetes, and end-stage renal disease on peritoneal dialysis.  Patient presents to hospital today for scheduled right hip arthroplasty.  Right hip arthroplasty, performed by general surgery.Procedure successful.  Pain management and PT OT per primary team.  2.  End-stage renal disease on peritoneal dialysis.  Will maintain outpatient prescription during this admission.  Will dialyze patient on 1.5% dialysate.  3. Anemia of chronic kidney disease Lab Results  Component Value Date   HGB 10.9 (L) 11/21/2023    Hemoglobin within desired range at this time.  Will monitor over the next couple days due to recent surgery.  Minimal blood loss reported  4. Secondary Hyperparathyroidism: with outpatient labs: PTH 449, phosphorus 3.9, calcium 7.8 on 10/24/23.   Lab Results  Component Value Date   CALCIUM 8.3 (L) 11/13/2023   CAION 0.86 (LL) 11/21/2023    Patient prescribed calcitriol  and cholecalciferol outpatient.  Will continue to monitor bone minerals during this admission.  5.  Hypertension with chronic kidney disease.  Home regimen includes carvedilol, clonidine, isosorbide, losartan, and torsemide.    LOS: 0 Jadene Stemmer 12/4/20243:11 PM

## 2023-11-21 NOTE — Transfer of Care (Signed)
Immediate Anesthesia Transfer of Care Note  Patient: Alexandria Moody  Procedure(s) Performed: TOTAL HIP ARTHROPLASTY (Right: Hip)  Patient Location: PACU  Anesthesia Type:General  Level of Consciousness: awake, alert , and oriented  Airway & Oxygen Therapy: Patient Spontanous Breathing  Post-op Assessment: Report given to RN and Post -op Vital signs reviewed and stable  Post vital signs: Reviewed and stable  Last Vitals:  Vitals Value Taken Time  BP 111/56 11/21/23 1149  Temp 36.4 C 11/21/23 1149  Pulse 62 11/21/23 1152  Resp 14 11/21/23 1152  SpO2 100 % 11/21/23 1152  Vitals shown include unfiled device data.  Last Pain:  Vitals:   11/21/23 0753  TempSrc: Oral  PainSc: 4       Patients Stated Pain Goal: 0 (11/21/23 0753)  Complications: No notable events documented.

## 2023-11-21 NOTE — Progress Notes (Signed)
Patient is not able to walk the distance required to go the bathroom, or he/she is unable to safely negotiate stairs required to access the bathroom.  A 3in1 BSC will alleviate this problem   Liliann File P. Brighton Pilley, Jr. M.D.  

## 2023-11-22 ENCOUNTER — Encounter: Payer: Self-pay | Admitting: Orthopedic Surgery

## 2023-11-22 DIAGNOSIS — M1611 Unilateral primary osteoarthritis, right hip: Secondary | ICD-10-CM | POA: Diagnosis not present

## 2023-11-22 LAB — GLUCOSE, CAPILLARY
Glucose-Capillary: 229 mg/dL — ABNORMAL HIGH (ref 70–99)
Glucose-Capillary: 142 mg/dL — ABNORMAL HIGH (ref 70–99)

## 2023-11-22 LAB — HEPATITIS B SURFACE ANTIGEN: Hepatitis B Surface Ag: NONREACTIVE

## 2023-11-22 MED ORDER — TRAMADOL HCL 50 MG PO TABS: 50.0000 mg | ORAL_TABLET | ORAL | 0 refills | Status: DC | PRN

## 2023-11-22 MED ORDER — ASPIRIN 81 MG PO TBEC: 81.0000 mg | DELAYED_RELEASE_TABLET | Freq: Two times a day (BID) | ORAL | Status: DC

## 2023-11-22 MED ORDER — OXYCODONE HCL 5 MG PO TABS: 5.0000 mg | ORAL_TABLET | ORAL | 0 refills | Status: DC | PRN

## 2023-11-22 NOTE — Progress Notes (Signed)
Peritoneal Dialysis  Post Treatment Note:  PD treatment completed. Patient tolerated treatment well.   PD effluent is clear, a huge fibrin was noted in one of her drain bags. Per floor RN, Machine alarmed 6 times last night for slow flow.  PD exit site clean, dry and intact.   No specimen collected.  Patient is awake and alert and no acute distress.  Hand-off given to patient's nurse.    Total UF removed: -288 ml Post treatment weight: 68.9 kg   Ralene Muskrat RN Kidney Dialysis Unit

## 2023-11-22 NOTE — Plan of Care (Signed)
  Problem: Education: Goal: Knowledge of General Education information will improve Description: Including pain rating scale, medication(s)/side effects and non-pharmacologic comfort measures Outcome: Adequate for Discharge   Problem: Health Behavior/Discharge Planning: Goal: Ability to manage health-related needs will improve Outcome: Adequate for Discharge   Problem: Clinical Measurements: Goal: Ability to maintain clinical measurements within normal limits will improve Outcome: Adequate for Discharge Goal: Will remain free from infection Outcome: Adequate for Discharge Goal: Diagnostic test results will improve Outcome: Adequate for Discharge Goal: Respiratory complications will improve Outcome: Adequate for Discharge Goal: Cardiovascular complication will be avoided Outcome: Adequate for Discharge   Problem: Activity: Goal: Risk for activity intolerance will decrease Outcome: Adequate for Discharge   Problem: Nutrition: Goal: Adequate nutrition will be maintained Outcome: Adequate for Discharge   Problem: Coping: Goal: Level of anxiety will decrease Outcome: Adequate for Discharge   Problem: Elimination: Goal: Will not experience complications related to bowel motility Outcome: Adequate for Discharge Goal: Will not experience complications related to urinary retention Outcome: Adequate for Discharge   Problem: Pain Management: Goal: General experience of comfort will improve Outcome: Adequate for Discharge   Problem: Safety: Goal: Ability to remain free from injury will improve Outcome: Adequate for Discharge   Problem: Skin Integrity: Goal: Risk for impaired skin integrity will decrease Outcome: Adequate for Discharge   Problem: Education: Goal: Ability to describe self-care measures that may prevent or decrease complications (Diabetes Survival Skills Education) will improve Outcome: Adequate for Discharge Goal: Individualized Educational Video(s) Outcome:  Adequate for Discharge   Problem: Coping: Goal: Ability to adjust to condition or change in health will improve Outcome: Adequate for Discharge   Problem: Fluid Volume: Goal: Ability to maintain a balanced intake and output will improve Outcome: Adequate for Discharge   Problem: Health Behavior/Discharge Planning: Goal: Ability to identify and utilize available resources and services will improve Outcome: Adequate for Discharge Goal: Ability to manage health-related needs will improve Outcome: Adequate for Discharge   Problem: Metabolic: Goal: Ability to maintain appropriate glucose levels will improve Outcome: Adequate for Discharge   Problem: Nutritional: Goal: Maintenance of adequate nutrition will improve Outcome: Adequate for Discharge Goal: Progress toward achieving an optimal weight will improve Outcome: Adequate for Discharge   Problem: Skin Integrity: Goal: Risk for impaired skin integrity will decrease Outcome: Adequate for Discharge   Problem: Tissue Perfusion: Goal: Adequacy of tissue perfusion will improve Outcome: Adequate for Discharge   Problem: Education: Goal: Knowledge of the prescribed therapeutic regimen will improve Outcome: Adequate for Discharge Goal: Understanding of discharge needs will improve Outcome: Adequate for Discharge Goal: Individualized Educational Video(s) Outcome: Adequate for Discharge   Problem: Activity: Goal: Ability to avoid complications of mobility impairment will improve Outcome: Adequate for Discharge Goal: Ability to tolerate increased activity will improve Outcome: Adequate for Discharge   Problem: Clinical Measurements: Goal: Postoperative complications will be avoided or minimized Outcome: Adequate for Discharge   Problem: Pain Management: Goal: Pain level will decrease with appropriate interventions Outcome: Adequate for Discharge   Problem: Skin Integrity: Goal: Will show signs of wound healing Outcome:  Adequate for Discharge

## 2023-11-22 NOTE — Progress Notes (Signed)
Reviewed discharge instructions with patient. Patient acknowledged understand. Patient discharged with personal belongings. Patient wheeled out by staff. Patient transported via family vehicle.

## 2023-11-22 NOTE — Progress Notes (Signed)
[facility]  ROUNDING NOTE   Subjective:   Alexandria Moody is a 77 y.o. female with past medical conditions including arthritis, CHF, COPD, hypertension, hyperlipidemia, CAD, bilateral diabetic retinopathy, interstitial lung disease, CABG, type 2 diabetes, and end-stage renal disease on peritoneal dialysis.  Patient presents to hospital today for scheduled right hip arthroplasty.  Patient seen sitting up in chair Mild discomfort from right hip States she will discharge later today.    Objective:  Vital signs in last 24 hours:  Temp:  [97.8 F (36.6 C)-98.7 F (37.1 C)] 97.8 F (36.6 C) (12/04 2045) Pulse Rate:  [59-81] 76 (12/04 2045) Resp:  [12-19] 18 (12/04 2045) BP: (123-144)/(54-67) 135/67 (12/04 2045) SpO2:  [95 %-100 %] 96 % (12/04 2045) Weight:  [68.7 kg-68.9 kg] 68.9 kg (12/05 0753)  Weight change:  Filed Weights   11/21/23 0753 11/21/23 2033 11/22/23 0753  Weight: 67.6 kg 68.7 kg 68.9 kg    Intake/Output: I/O last 3 completed shifts: In: 1100 [I.V.:450; IV Piggyback:650] Out: 250 [Urine:100; Drains:100; Blood:50]   Intake/Output this shift:  Total I/O In: 288 [Other:288] Out: 100 [Drains:100]  Physical Exam: General: NAD  Head: Normocephalic, atraumatic. Moist oral mucosal membranes  Eyes: Anicteric  Lungs:  Clear to auscultation, room air  Heart: Regular rate and rhythm  Abdomen:  Soft, nontender, nondistended, PDC  Extremities: Trace peripheral edema.  Neurologic: Nonfocal, moving all four extremities  Skin: No lesions  Access: PD catheter    Basic Metabolic Panel: Recent Labs  Lab 11/21/23 0744  NA 138  K 3.9  CL 102  GLUCOSE 107*  BUN 43*  CREATININE 2.90*    Liver Function Tests: No results for input(s): "AST", "ALT", "ALKPHOS", "BILITOT", "PROT", "ALBUMIN" in the last 168 hours. No results for input(s): "LIPASE", "AMYLASE" in the last 168 hours. No results for input(s): "AMMONIA" in the last 168 hours.  CBC: Recent Labs   Lab 11/21/23 0744  HGB 10.9*  HCT 32.0*    Cardiac Enzymes: No results for input(s): "CKTOTAL", "CKMB", "CKMBINDEX", "TROPONINI" in the last 168 hours.  BNP: Invalid input(s): "POCBNP"  CBG: Recent Labs  Lab 11/21/23 1149 11/21/23 2045 11/22/23 0804 11/22/23 1127  GLUCAP 171* 348* 229* 142*    Microbiology: Results for orders placed or performed during the hospital encounter of 11/17/23  Urine Culture     Status: Abnormal   Collection Time: 11/17/23  9:12 AM   Specimen: Urine, Random  Result Value Ref Range Status   Specimen Description   Final    URINE, RANDOM Performed at [facility], [address], [place], [place] [address]    Special Requests   Final    NONE Reflexed from [id number] Performed at [facility], [address] [place], [place] [address]    Culture 40,000 COLONIES/mL ESCHERICHIA COLI (A)  Final   Report Status 11/19/2023 FINAL  Final   Organism ID, Bacteria ESCHERICHIA COLI (A)  Final      Susceptibility   Escherichia coli - MIC*    AMPICILLIN 4 SENSITIVE Sensitive     CEFAZOLIN <=4 SENSITIVE Sensitive     CEFEPIME <=0.12 SENSITIVE Sensitive     CEFTRIAXONE <=0.25 SENSITIVE Sensitive     CIPROFLOXACIN <=0.25 SENSITIVE Sensitive     GENTAMICIN <=1 SENSITIVE Sensitive     IMIPENEM <=0.25 SENSITIVE Sensitive     NITROFURANTOIN <=16 SENSITIVE Sensitive     TRIMETH/SULFA <=20 SENSITIVE Sensitive     AMPICILLIN/SULBACTAM <=2 SENSITIVE Sensitive  PIP/TAZO <=4 SENSITIVE Sensitive ug/mL    * 40,000 COLONIES/mL ESCHERICHIA COLI    Coagulation Studies: No results for input(s): "LABPROT", "INR" in the last 72 hours.  Urinalysis: No results for input(s): "COLORURINE", "LABSPEC", "PHURINE", "GLUCOSEU", "HGBUR", "BILIRUBINUR", "KETONESUR", "PROTEINUR", "UROBILINOGEN", "NITRITE", "LEUKOCYTESUR" in the last 72 hours.  Invalid input(s): "APPERANCEUR"    Imaging: DG HIP UNILAT W OR W/O PELVIS 2-3 VIEWS RIGHT  Result Date:  11/21/2023 CLINICAL DATA:  Status post right total hip arthroplasty. EXAM: DG HIP (WITH OR WITHOUT PELVIS) 2-3V RIGHT COMPARISON:  March 28, 2023. FINDINGS: Right femoral and acetabular components are well situated. Expected postoperative changes, including surgical drains, are noted in the surrounding soft tissues. IMPRESSION: Status post right total hip arthroplasty. Electronically Signed   By: Lupita Raider M.D.   On: 11/21/2023 14:25     Medications:    dialysis solution 1.5% low-MG/low-CA      amLODipine  5 mg Oral QHS   aspirin  81 mg Oral BID   calcitRIOL  0.25 mcg Oral Daily   calcium citrate  1 tablet Oral Daily   cholecalciferol  1,000 Units Oral Daily   citalopram  20 mg Oral [id number]   gentamicin cream  1 Application Topical Daily   hydroxychloroquine  200 mg Oral Daily   insulin aspart  0-15 Units Subcutaneous TID WC   insulin aspart  0-5 Units Subcutaneous QHS   insulin glargine-yfgn  16 Units Subcutaneous QPM   isosorbide mononitrate  120 mg Oral QHS   levothyroxine  75 mcg Oral QAC breakfast   losartan  100 mg Oral Daily   magnesium hydroxide  30 mL Oral Daily   metoCLOPramide  10 mg Oral TID AC & HS   multivitamin  1 tablet Oral Daily   pantoprazole  40 mg Oral BID   polyethylene glycol  17 g Oral Daily   ranolazine  500 mg Oral BID   rosuvastatin  10 mg Oral Daily   senna-docusate  1 tablet Oral BID   torsemide  20 mg Oral QID   acetaminophen, albuterol, alum & mag hydroxide-simeth, bisacodyl, diphenhydrAMINE, HYDROmorphone (DILAUDID) injection, menthol-cetylpyridinium **OR** phenol, nitroGLYCERIN, ondansetron **OR** ondansetron (ZOFRAN) IV, oxyCODONE, oxyCODONE, sodium phosphate, traMADol  Assessment/ Plan:  [name] is a 77 y.o.  female with past medical conditions including arthritis, CHF, COPD, hypertension, hyperlipidemia, CAD, bilateral diabetic retinopathy, interstitial lung disease, CABG, type 2 diabetes, and end-stage renal disease on peritoneal  dialysis.  Patient presents to hospital today for scheduled right hip arthroplasty.  Right hip arthroplasty, performed by general surgery.Procedure successful.  Possible discharge later today.  2.  End-stage renal disease on peritoneal dialysis.  Will maintain outpatient prescription at discharge  3. Anemia of chronic kidney disease Lab Results  Component Value Date   HGB 10.9 (L) 11/21/2023    Hemoglobin stable  4. Secondary Hyperparathyroidism: with outpatient labs: PTH 449, phosphorus 3.9, calcium 7.8 on 10/24/23.   Lab Results  Component Value Date   CALCIUM 8.3 (L) 11/13/2023   CAION 0.86 (LL) 11/21/2023    Patient prescribed calcitriol and cholecalciferol outpatient.    5.  Hypertension with chronic kidney disease.  Home regimen includes carvedilol, clonidine, isosorbide, losartan, and torsemide. Blood pressure stable    LOS: 0 Nils Thor 12/5/202412:49 PM

## 2023-11-22 NOTE — Progress Notes (Signed)
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    PT Comments  Pt was long sitting in bed upon arrival. She is A and O x 4 and extremely motivated. Only c/o 2/10 pain at max during session. Overall demonstrates safe abilities to exit bed, stand to RW, and ambulate 200 ft. No LOB or safety concern with ambulation. Reviewed hip precautions (she was able to recall 2/3) and importance of continued routine mobility. Pt endorses having assistance at home at DC. Recommend continued skilled PT to maximize independence and safety with all ADLs.MD in room at conclusion of session. Will return after breakfast to assist/ perform stair training to simulate home entry.     If plan is discharge home, recommend the following: Assist for transportation     Equipment Recommendations  None recommended by PT       Precautions / Restrictions Precautions Precautions: Posterior Hip;Fall Precaution Booklet Issued: Yes (comment) Restrictions Weight Bearing Restrictions: Yes RLE Weight Bearing: Weight bearing as tolerated     Mobility  Bed Mobility Overal bed mobility: Needs Assistance Bed Mobility: Supine to Sit  Supine to sit: Contact guard, Used rails  General bed mobility comments: increased time required with vcs for sequencing improvements    Transfers Overall transfer level: Needs assistance Equipment used: Rolling walker (2 wheels) Transfers: Sit to/from Stand Sit to Stand: Supervision  General transfer comment: no physical assistance to stand from EOB or recliner surface    Ambulation/Gait Ambulation/Gait assistance: Contact guard assist, Supervision Gait Distance (Feet): 200 Feet Assistive device: Rolling walker (2 wheels) Gait Pattern/deviations: Step-through pattern,  Antalgic Gait velocity: decreased  General Gait Details: Pt ambulated ~ 200 ft with RW. no LOB or safety concerns.     Balance Overall balance assessment: Needs assistance Sitting-balance support: Feet supported Sitting balance-Leahy Scale: Good     Standing balance support: Bilateral upper extremity supported, During functional activity, Reliant on assistive device for balance Standing balance-Leahy Scale: Good       Cognition Arousal: Alert Behavior During Therapy: WFL for tasks assessed/performed Overall Cognitive Status: Within Functional Limits for tasks assessed      General Comments: pt is A and O x 4. recalled 2/3 hip precautions           General Comments General comments (skin integrity, edema, etc.): reviewed hip precautions, importance of HEP and routine mobility, and expectations going forward with PT      Pertinent Vitals/Pain Pain Assessment Pain Assessment: 0-10 Pain Score: 2  Pain Location: L ankle Pain Descriptors / Indicators: Dull, Discomfort Pain Intervention(s): Limited activity within patient's tolerance, Monitored during session, Repositioned, Ice applied     PT Goals (current goals can now be found in the care plan section) Acute Rehab PT Goals Patient Stated Goal: go home with my daughter helpig Progress towards PT goals: Progressing toward goals    Frequency    BID       AM-PAC PT "6 Clicks" Mobility   Outcome Measure  Help needed turning from your back to your side while in a flat bed without using bedrails?: A Little Help needed moving from lying on your back to sitting on the side of a flat bed without using bedrails?: A Little Help needed moving to and from a bed to a chair (including a wheelchair)?: A Little  Help needed standing up from a chair using your arms (e.g., wheelchair or bedside chair)?: A Little Help needed to walk in hospital room?: A Little Help needed climbing 3-5 steps with a railing? : A Little 6 Click  Score: 18    End of Session Equipment Utilized During Treatment: Gait belt Activity Tolerance: Patient tolerated treatment well Patient left: in chair;with call bell/phone within reach;with chair alarm set Nurse Communication: Mobility status PT Visit Diagnosis: Difficulty in walking, not elsewhere classified (R26.2);Other abnormalities of gait and mobility (R26.89)     Time: 7829-5621 PT Time Calculation (min) (ACUTE ONLY): 26 min  Charges:    $Gait Training: 8-22 mins $Therapeutic Activity: 8-22 mins PT General Charges $$ ACUTE PT VISIT: 1 Visit                    Jetta Lout PTA 11/22/23, 9:41 AM

## 2023-11-22 NOTE — Progress Notes (Signed)
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    PT Comments  Author returned after breakfast to perform stair training to simulate home entry at pt's daughters house. " Im going to my house at DC but want to be able to go to her house for christmas." Pt was able to stand from recliner to RW and ambulate ~ 75 ft to stairwell. She safely performed ascending/ descending 2 stair 2 x to simulate daughters house. Pt did require CGA-min assist for safety. Recommend pt work with HHPT on stairs prior to pt going to her daughters. Overall though, pt is progressing well. She will benefit from continued skilled PT to maximize independence and safety with all ADLs.    If plan is discharge home, recommend the following: Assist for transportation     Equipment Recommendations  None recommended by PT       Precautions / Restrictions Precautions Precautions: Posterior Hip;Fall Precaution Booklet Issued: Yes (comment) Restrictions Weight Bearing Restrictions: Yes RLE Weight Bearing: Weight bearing as tolerated     Mobility  Bed Mobility Overal bed mobility: Needs Assistance Bed Mobility: Supine to Sit  Supine to sit: Contact guard, Used rails  General bed mobility comments: In recliner pre/post session    Transfers Overall transfer level: Needs assistance Equipment used: Rolling walker (2 wheels) Transfers: Sit to/from Stand Sit to Stand: Supervision General transfer comment: no physical assistance to stand from recliner surface    Ambulation/Gait Ambulation/Gait assistance: Supervision Gait Distance (Feet): 120 Feet Assistive device: Rolling walker (2 wheels) Gait Pattern/deviations: Step-through pattern Gait velocity: decreased  General Gait Details: Pt was able to ambulate  another 120 ft after 200 earlier to perform stairs to simulate home entry at her daughters.   Stairs Stairs: Yes Stairs assistance: Contact guard assist, Min assist Stair Management: One rail Left, Step to pattern, With walker Number of Stairs: 2 General stair comments: Pt's daughter has 2 stairs but pt is planning to go to her own house that does not have stairs at all. Se did perform 2 stair 2 x with BUE on +1 rail then with use of RW only.    Balance Overall balance assessment: Needs assistance Sitting-balance support: Feet supported Sitting balance-Leahy Scale: Good     Standing balance support: Bilateral upper extremity supported, During functional activity, Reliant on assistive device for balance Standing balance-Leahy Scale: Good     Cognition Arousal: Alert Behavior During Therapy: WFL for tasks assessed/performed Overall Cognitive Status: Within Functional Limits for tasks assessed    General Comments: Pt is A and O  and agreeable to session/stair training           General Comments General comments (skin integrity, edema, etc.): reviewed hip precautions, importance of HEP and routine mobility, and expectations going forward with PT      Pertinent Vitals/Pain Pain Assessment Pain Assessment: 0-10 Pain Score: 3  Pain Location: R hip Pain Descriptors / Indicators: Dull, Discomfort Pain Intervention(s): Limited activity within patient's tolerance, Monitored during session, Repositioned     PT Goals (current goals can now be found in the care plan section) Acute Rehab PT Goals Patient Stated Goal: go home with my daughter helping Progress towards PT goals: Progressing toward goals    Frequency    BID       AM-PAC PT "  6 Clicks" Mobility   Outcome Measure  Help needed turning from your back to your side while in a flat bed without using bedrails?: A Little Help needed moving from lying on your back to sitting on the side of a flat bed without using  bedrails?: A Little Help needed moving to and from a bed to a chair (including a wheelchair)?: A Little Help needed standing up from a chair using your arms (e.g., wheelchair or bedside chair)?: A Little Help needed to walk in hospital room?: A Little Help needed climbing 3-5 steps with a railing? : A Little 6 Click Score: 18    End of Session Equipment Utilized During Treatment: Gait belt Activity Tolerance: Patient tolerated treatment well Patient left: in chair;with call bell/phone within reach;with chair alarm set Nurse Communication: Mobility status PT Visit Diagnosis: Difficulty in walking, not elsewhere classified (R26.2);Other abnormalities of gait and mobility (R26.89)     Time: 1478-2956 PT Time Calculation (min) (ACUTE ONLY): 17 min  Charges:    $Gait Training: 8-22 mins $Therapeutic Activity: 8-22 mins PT General Charges $$ ACUTE PT VISIT: 1 Visit                    Jetta Lout PTA 11/22/23, 10:39 AM

## 2023-11-22 NOTE — TOC Initial Note (Signed)
Transition of Care [facility]) - Initial/Assessment Note    Patient Details  Name: Mablene Lottman MRN: [id number] Date of Birth: 1946-12-07  Transition of Care [facility]) CM/SW Contact:    Marlowe Sax, RN Phone Number: 11/22/2023, 9:16 AM  Clinical Narrative:                 The patient lives at home alone but her daughter will be able to help as needed, she has Agricultural consultant (2 wheels);Rollator (4 wheels);BSC/3in1;Shower seat;Grab bars - tub/shower;Adaptive equipment  at home, she is set up with [facility] services prior to admission by surgeons office  Expected Discharge Plan: Home w Home Health Services Barriers to Discharge: No Barriers Identified   Patient Goals and CMS Choice            Expected Discharge Plan and Services   Discharge Planning Services: CM Consult   Living arrangements for the past 2 months: Single Family Home                 DME Arranged: N/A DME Agency: NA       HH Arranged: PT, OT HH Agency: [facility] Date HH Agency Contacted: 11/22/23 Time HH Agency Contacted: [id number] Representative spoke with at [facility] Agency: [place]  Prior Living Arrangements/Services Living arrangements for the past 2 months: Single Family Home Lives with:: Self Patient language and need for interpreter reviewed:: Yes Do you feel safe going back to the place where you live?: Yes      Need for Family Participation in Patient Care: Yes (Comment) Care giver support system in place?: Yes (comment) Current home services: DME (Rolling Walker (2 wheels);Rollator (4 wheels);BSC/3in1;Shower seat;Grab bars - tub/shower;Adaptive equipment) Criminal Activity/Legal Involvement Pertinent to Current Situation/Hospitalization: No - Comment as needed  Activities of Daily Living   ADL Screening (condition at time of admission) Independently performs ADLs?: Yes (appropriate for developmental age) Is the patient deaf or have difficulty hearing?: No Does the patient have  difficulty seeing, even when wearing glasses/contacts?: No Does the patient have difficulty concentrating, remembering, or making decisions?: No  Permission Sought/Granted   Permission granted to share information with : Yes, Verbal Permission Granted              Emotional Assessment Appearance:: Appears stated age Attitude/Demeanor/Rapport: Engaged Affect (typically observed): Accepting Orientation: : Oriented to Self, Oriented to Place, Oriented to  Time, Oriented to Situation Alcohol / Substance Use: Not Applicable Psych Involvement: No (comment)  Admission diagnosis:  Hx of total hip arthroplasty, right [F62.130] Patient Active Problem List   Diagnosis Date Noted   Hx of total hip arthroplasty, right 11/21/2023   Bradycardia 04/24/2023   Status post total hip replacement, left 03/28/2023   Acute exacerbation of CHF (congestive heart failure) (HCC) 01/25/2023   Insulin dependent type 2 diabetes mellitus (HCC) 01/24/2023   CAP (community acquired pneumonia) 01/24/2023   Dyspnea 01/24/2023   ESRD on peritoneal dialysis (HCC) 01/24/2023   Primary osteoarthritis of right hip 11/26/2022   Pain of left hip joint 03/09/2022   Nonexudative age-related macular degeneration, right eye, early dry stage 10/04/2021   Exudative age-related macular degeneration of left eye with active choroidal neovascularization (HCC) 10/04/2021   Macrocytosis 05/10/2021   Stage 4 chronic kidney disease (HCC) 05/10/2021   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   Osteoporosis, 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/2021   Secondary hyperparathyroidism, renal (HCC) 03/24/2021   B12 deficiency 02/01/2021   Senile purpura (HCC) 12/24/2020   Carotid atherosclerosis, bilateral 12/21/2020  Chronic diastolic congestive heart failure (HCC) 12/21/2020   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) 12/21/2020   GERD (gastroesophageal reflux disease) 12/21/2020   Hypothyroidism  (acquired) 12/21/2020   Major depression in full remission (HCC) 12/21/2020   Seborrheic dermatitis 12/21/2020   Sensorineural hearing loss (SNHL) of both ears 12/21/2020   Thyroid disease 12/21/2020   Vitamin D deficiency 12/21/2020   Postoperative follow-up 12/09/2020   Cystoid macular edema of right eye 11/16/2020   Macular pucker, right eye 10/12/2020   Atherosclerosis of abdominal aorta (HCC) 09/10/2020   Bronchiectasis without complication (HCC) 09/10/2020   High risk medication use 09/10/2020   Severe nonproliferative diabetic retinopathy of left eye, with macular edema, associated with type 2 diabetes mellitus (HCC) 09/07/2020   Cystoid macular edema of left eye 09/07/2020   History of vitrectomy 09/07/2020   Moderate nonproliferative diabetic retinopathy of right eye (HCC) 09/07/2020   Both eyes affected by mild nonproliferative diabetic retinopathy with macular edema, associated with type 2 diabetes mellitus (HCC) 09/07/2020   Essential hypertension 11/18/2019   Fatigue 11/18/2019   Stage 3b chronic kidney disease (HCC) 11/18/2019   Non-rheumatic aortic sclerosis 07/22/2018   Adenomatous polyp of colon 10/10/2017   Chronic obstructive pulmonary disease (HCC) 03/28/2017   Osteopenia of multiple sites 06/05/2016   PAD (peripheral artery disease) (HCC) 08/17/2015   PVD (peripheral vascular disease) (HCC) 08/17/2015   Atherosclerosis of native arteries of extremity with intermittent claudication (HCC) 06/29/2015   Myalgia 06/29/2015   Claudication of both lower extremities (HCC) 06/29/2015   Stable angina (HCC) 02/23/2015   TIA (transient ischemic attack) 01/18/2015   Anemia in chronic kidney disease 08/20/2014   Anemia in stage 4 chronic kidney disease (HCC) 08/20/2014   Dizziness 07/24/2014   DOE (dyspnea on exertion) 07/24/2014   Calculus of gallbladder w/o mention of cholecystitis or obstruction 01/05/2014   Renal failure syndrome 11/19/2013   Contusion of right knee  11/10/2013   Facial contusion 11/10/2013   Fall 11/10/2013   Left elbow pain 11/10/2013   Left radial head fracture 11/10/2013   Right knee pain 11/10/2013   Occlusion and stenosis of carotid artery 06/23/2013   Stenosis of carotid artery 06/23/2013   Syncope 03/09/2013   Anemia of chronic disease 11/01/2012   Chronic kidney disease, stage 4 (severe) (HCC) 09/11/2012   Stage 5 chronic kidney disease on chronic dialysis (HCC) 09/11/2012   Anemia 06/24/2012   Type 2 diabetes mellitus (HCC) 07/29/2011   Abnormal cardiovascular stress test 07/29/2011   Coronary artery disease involving native heart 07/29/2011   Hypertension associated with type 2 diabetes mellitus (HCC) 07/29/2011   Hyperlipidemia associated with type 2 diabetes mellitus (HCC) 07/29/2011   Obesity (BMI 30-39.9) 07/29/2011   Situational stress 07/29/2011   Type 2 diabetes mellitus with ESRD (end-stage renal disease) (HCC) 07/29/2011   PCP:  Cleon Dew, FNP Pharmacy:   [facility] [address] [place] - [address] [place] [address] Phone: [phone] Fax: [phone]     Social Determinants of Health (SDOH) Social History: SDOH Screenings   Food Insecurity: No Food Insecurity (11/21/2023)  Housing: Low Risk  (11/21/2023)  Transportation Needs: No Transportation Needs (11/21/2023)  Utilities: Not At Risk (11/21/2023)  Financial Resource Strain: Low Risk  (09/08/2021)   Received from [facility], [facility]  Physical Activity: Sufficiently Active (09/08/2021)   Received from [facility], [facility]  Social Connections: Unknown (02/13/2023)   Received  from [facility], [facility]  Stress: No Stress Concern Present (09/08/2021)   Received from [facility], [facility]  Tobacco Use: Medium Risk (11/21/2023)  Health Literacy: Low Risk  (09/08/2021)   Received from [facility], [facility]   SDOH Interventions:     Readmission Risk Interventions      No data to display

## 2023-11-22 NOTE — Plan of Care (Signed)

## 2023-11-22 NOTE — Evaluation (Signed)
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   Clinical Impression   Alexandria Moody was seen for OT evaluation this date. Pt presents at or near her functional baseline of MOD I for ADL management she reports plans to have her daughter stay with her immediately after surgery to provide 24/7 assist. Pt normally lives alone in a 1 level apartment home. She has good recall/carryover from prior therapy sessions on posterior THPs. She is able to return demo understanding of all education provided today during functional activity. And requires SUPERVISION for safety with functional transfers. Anticipate pt will quickly progress to MOD I as her pain improves. No further skilled OT needs identified. Will sign off at this time. Please re-consult if additional OT needs arise during this hospital stay.      If plan is discharge home, recommend the following: A little help with walking and/or transfers;A little help with bathing/dressing/bathroom;Help with stairs or ramp for entrance;Assist for transportation    Functional Status Assessment  Patient has not had a recent decline in their functional status  Equipment Recommendations  None recommended by OT    Recommendations for Other Services       Precautions / Restrictions Precautions Precautions: Posterior Hip;Fall Precaution Booklet Issued: Yes (comment) Restrictions Weight Bearing Restrictions: Yes RLE Weight Bearing: Weight bearing as tolerated      Mobility Bed Mobility               General bed mobility comments: In recliner pre/post session    Transfers Overall transfer level: Needs assistance Equipment used: Rolling walker (2 wheels) Transfers: Sit to/from Stand Sit to Stand: Supervision           General  transfer comment: no physical assistance to stand from recliner surface, good safety awareness and adherence to posterior hip precautions      Balance Overall balance assessment: Needs assistance Sitting-balance support: Feet supported Sitting balance-Leahy Scale: Good     Standing balance support: No upper extremity supported, During functional activity, Bilateral upper extremity supported Standing balance-Leahy Scale: Good Standing balance comment: using RW for support in during functional mobility able to stand briefly wtihout UE support.                           ADL either performed or assessed with clinical judgement   ADL Overall ADL's : At baseline                                       General ADL Comments: Pt with good recall/carryover of education from prior hip sx in april. Able to return verbalize all hip precautions and strategies for completing ADL management safely upon DC home. Pt plans to have her daughter stay with her 24/7 after sx. She presents at or near her functional baseline.     Vision Patient Visual Report: No change from baseline       Perception         Praxis         Pertinent Vitals/Pain Pain Assessment Pain Assessment: 0-10 Pain Score: 4  Pain Location: R hip with mobility Pain Descriptors / Indicators: Dull, Discomfort Pain Intervention(s): Limited activity within patient's  tolerance, Monitored during session, Repositioned     Extremity/Trunk Assessment Upper Extremity Assessment Upper Extremity Assessment: Overall WFL for tasks assessed   Lower Extremity Assessment Lower Extremity Assessment: Generalized weakness;RLE deficits/detail RLE Deficits / Details: s/p R TKA posterior THPS   Cervical / Trunk Assessment Cervical / Trunk Assessment: Normal   Communication Communication Communication: No apparent difficulties   Cognition Arousal: Alert Behavior During Therapy: WFL for tasks  assessed/performed Overall Cognitive Status: Within Functional Limits for tasks assessed                                 General Comments: Pt is A and O  and agreeable to session/stair training     General Comments  reviewed hip precautions, importance of HEP and routine mobility, and expectations going forward with PT    Exercises Other Exercises Other Exercises: Pt educated on safe use of AE/DME for ADL management, reviewed posterior THPs, pt with good recall/carryover from previous sessions.   Shoulder Instructions      Home Living Family/patient expects to be discharged to:: Private residence Living Arrangements: Alone Available Help at Discharge: Family;Available 24 hours/day Type of Home: Apartment Home Access: Level entry     Home Layout: One level     Bathroom Shower/Tub: Chief Strategy Officer: Standard Bathroom Accessibility: Yes   Home Equipment: Agricultural consultant (2 wheels);Rollator (4 wheels);BSC/3in1;Shower seat;Grab bars - tub/shower;Adaptive equipment Adaptive Equipment: Reacher;Long-handled sponge        Prior Functioning/Environment Prior Level of Function : Independent/Modified Independent;Driving;History of Falls (last six months)             Mobility Comments: Mod I with rollator for ambulation. history of recent falls ADLs Comments: Mod I        OT Problem List: Decreased strength;Impaired balance (sitting and/or standing);Pain;Decreased range of motion      OT Treatment/Interventions:      OT Goals(Current goals can be found in the care plan section) Acute Rehab OT Goals Patient Stated Goal: to go home OT Goal Formulation: All assessment and education complete, DC therapy Time For Goal Achievement: 11/22/23 Potential to Achieve Goals: Good  OT Frequency:      Co-evaluation              AM-PAC OT "6 Clicks" Daily Activity     Outcome Measure Help from another person eating meals?: None Help from  another person taking care of personal grooming?: None Help from another person toileting, which includes using toliet, bedpan, or urinal?: A Little Help from another person bathing (including washing, rinsing, drying)?: A Little Help from another person to put on and taking off regular upper body clothing?: None Help from another person to put on and taking off regular lower body clothing?: A Little 6 Click Score: 21   End of Session Equipment Utilized During Treatment: Gait belt;Rolling walker (2 wheels) Nurse Communication: Mobility status  Activity Tolerance: Patient tolerated treatment well Patient left: in chair  OT Visit Diagnosis: Other abnormalities of gait and mobility (R26.89);Pain Pain - Right/Left: Right Pain - part of body: Hip                Time: 4540-9811 OT Time Calculation (min): 9 min Charges:  OT General Charges $OT Visit: 1 Visit OT Evaluation $OT Eval Low Complexity: 1 Low  Rockney Ghee, M.S., OTR/L 11/22/23, 12:35 PM

## 2023-11-24 ENCOUNTER — Telehealth: Payer: Self-pay | Admitting: Physician Assistant

## 2023-11-24 MED ORDER — CEPHALEXIN 500 MG PO CAPS: 500.0000 mg | ORAL_CAPSULE | Freq: Two times a day (BID) | ORAL | 0 refills | Status: AC

## 2023-11-24 NOTE — Telephone Encounter (Signed)
Patient called reporting continued UTI symptoms.  She was seen here 1 week ago and put on cefdinir 300 mg daily due to kidney failure/dialysis status. Had a hip placement a few days ago and is on a leave her home.  She requests another antibiotic to help clear up the infection.  I sent Keflex 500 mg twice daily x 7 days.  To take Keflex after dialysis on dialysis days.  Reviewed ED precautions.

## 2023-12-27 ENCOUNTER — Other Ambulatory Visit (INDEPENDENT_AMBULATORY_CARE_PROVIDER_SITE_OTHER): Payer: Self-pay | Admitting: Vascular Surgery

## 2023-12-27 DIAGNOSIS — I70212 Atherosclerosis of native arteries of extremities with intermittent claudication, left leg: Secondary | ICD-10-CM

## 2023-12-27 DIAGNOSIS — I6523 Occlusion and stenosis of bilateral carotid arteries: Secondary | ICD-10-CM

## 2023-12-28 ENCOUNTER — Encounter (INDEPENDENT_AMBULATORY_CARE_PROVIDER_SITE_OTHER): Payer: Medicare PPO

## 2023-12-28 ENCOUNTER — Ambulatory Visit (INDEPENDENT_AMBULATORY_CARE_PROVIDER_SITE_OTHER): Payer: Medicare PPO | Admitting: Nurse Practitioner

## 2024-01-31 ENCOUNTER — Encounter (INDEPENDENT_AMBULATORY_CARE_PROVIDER_SITE_OTHER): Payer: Medicare PPO

## 2024-01-31 ENCOUNTER — Ambulatory Visit (INDEPENDENT_AMBULATORY_CARE_PROVIDER_SITE_OTHER): Payer: Medicare PPO | Admitting: Nurse Practitioner

## 2024-02-22 ENCOUNTER — Ambulatory Visit (INDEPENDENT_AMBULATORY_CARE_PROVIDER_SITE_OTHER): Payer: Medicare PPO | Admitting: Nurse Practitioner

## 2024-02-22 ENCOUNTER — Ambulatory Visit (INDEPENDENT_AMBULATORY_CARE_PROVIDER_SITE_OTHER): Payer: Medicare PPO

## 2024-02-22 VITALS — BP 148/73 | HR 67 | Resp 16 | Ht 63.0 in | Wt 149.0 lb

## 2024-02-22 DIAGNOSIS — E1122 Type 2 diabetes mellitus with diabetic chronic kidney disease: Secondary | ICD-10-CM

## 2024-02-22 DIAGNOSIS — N184 Chronic kidney disease, stage 4 (severe): Secondary | ICD-10-CM

## 2024-02-22 DIAGNOSIS — I70212 Atherosclerosis of native arteries of extremities with intermittent claudication, left leg: Secondary | ICD-10-CM | POA: Diagnosis not present

## 2024-02-22 DIAGNOSIS — I6523 Occlusion and stenosis of bilateral carotid arteries: Secondary | ICD-10-CM

## 2024-02-22 DIAGNOSIS — I10 Essential (primary) hypertension: Secondary | ICD-10-CM

## 2024-02-24 ENCOUNTER — Encounter (INDEPENDENT_AMBULATORY_CARE_PROVIDER_SITE_OTHER): Payer: Self-pay | Admitting: Nurse Practitioner

## 2024-02-24 NOTE — Progress Notes (Incomplete)
 Subjective:    Patient ID: Alexandria Moody, female    DOB: November 25, 1946, 78 y.o.   MRN: [id number] Chief Complaint  Patient presents with  . Follow-up    ultrasound    HPI  Review of Systems     Objective:   Physical Exam  BP (!) 148/73 (BP Location: Right Arm, Patient Position: Sitting, Cuff Size: Normal)   Pulse 67   Resp 16   Ht 5\' 3"  (1.6 m)   Wt 149 lb (67.6 kg)   BMI 26.39 kg/m   Past Medical History:  Diagnosis Date  . Adenomatous polyp of colon   . Anemia of chronic renal failure    a.) recieving iron infusions + epoetin alfa-epbx (Retacrit)  . Anginal pain (HCC)   . Aortic atherosclerosis (HCC)   . Arthritis   . Atherosclerosis of native arteries of extremity with intermittent claudication (HCC)    a.) ABI/TBI 06/24/2021: 0.95 RIGHT, 0.72 LEFT  . Beta-blockers contraindicated    a.) result in significant bradycardia; previously on carvedilol as Rx'd by cardiology -- disontinued due to symptomatic bradycardia  . Bilateral carotid artery disease (HCC) 07/25/2021   a.) carotid doppler 07/25/2021: < 50% RICA, < 50% LICA.  Marland Kitchen Bradycardia   . CHF (congestive heart failure) (HCC)    a.) TTE 05/12/2020: LVEF 55-60%; mild LA enlargement; trivial to mild MR; G1DD; b.) TTE 01/25/2023: EF 55-60%, mild MR, G1DD.  Marland Kitchen COPD (chronic obstructive pulmonary disease) (HCC)   . Coronary artery disease    a.) 3v CABG 2002 --> LIMA-LAD, SVG-diagonal, SVG-OM  . DDD (degenerative disc disease), cervical   . DDD (degenerative disc disease), lumbar   . Diabetic retinopathy of both eyes (HCC)   . ESRD on peritoneal dialysis [facility])    a.) started PD in 02/2022  . GERD (gastroesophageal reflux disease)   . 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 Heart murmur   . HLD (hyperlipidemia)   . Hypertension   . Hypothyroidism   . Insomnia    a.) uses melatonin PRN  .  Interstitial lung disease (HCC)   . Long term current use of immunosuppressive drug    a.) hydroxychloriquine for ANCA (+) glomerulonephritis  . Long-term use of aspirin therapy   . Macular degeneration   . MDD (major depressive disorder)   . Osteoporosis    a.) on denosumab injections  . PAD (peripheral artery disease) (HCC)   . Pneumonia 11/2021  . S/P CABG x 3 2002   a.) LIMA-LAD, SVG-diagnoal, SVG-OM  . Seborrheic dermatitis   . SNHL (sensorineural hearing loss)   . Squamous cell skin cancer    legs, hands, scalp  . TIA (transient ischemic attack) 2019  . Type 2 diabetes mellitus with chronic kidney disease on chronic dialysis, with long-term current use of insulin (HCC)   . Vitamin D deficiency     Social History   Socioeconomic History  . Marital status: Widowed    Spouse name: Not on file  . Number of children: 2  . Years of education: Not on file  . Highest education level: Not on file  Occupational History  . Not on file  Tobacco Use  . Smoking status: Former    Current packs/day: 0.00    Average packs/day: 2.0 packs/day for 25.0 years (50.0 ttl pk-yrs)    Types: Cigarettes    Start date: 75    Quit date: 2000  Years since quitting: 25.2    Passive exposure: Past  . Smokeless tobacco: Never  Vaping Use  . Vaping status: Never Used  Substance and Sexual Activity  . Alcohol use: Never  . Drug use: Never  . Sexual activity: Not Currently  Other Topics Concern  . Not on file  Social History Narrative  . Not on file   Social Drivers of Health   Financial Resource Strain: Low Risk  (09/08/2021)   Received from [facility], [facility]   Overall Financial Resource Strain (CARDIA)   . Difficulty of Paying Living Expenses: Not hard at all  Food Insecurity: No Food Insecurity (11/21/2023)   Hunger Vital Sign   . Worried About Programme researcher, broadcasting/film/video in the Last Year: Never true   . Ran Out of Food in the Last Year: Never true  Transportation Needs:  No Transportation Needs (11/21/2023)   PRAPARE - Transportation   . Lack of Transportation (Medical): No   . Lack of Transportation (Non-Medical): No  Physical Activity: Sufficiently Active (09/08/2021)   Received from [facility], [facility]   Exercise Vital Sign   . Days of Exercise per Week: 7 days   . Minutes of Exercise per Session: 30 min  Stress: No Stress Concern Present (09/08/2021)   Received from [facility], [facility]   [facility] - Occupational Stress Questionnaire   . Feeling of Stress : Not at all  Social Connections: Unknown (02/13/2023)   [facility], [facility]   Social Network   . Social Network: Not on file  Intimate Partner Violence: Not At Risk (11/21/2023)   Humiliation, Afraid, Rape, and Kick questionnaire   . Fear of Current or Ex-Partner: No   . Emotionally Abused: No   . Physically Abused: No   . Sexually Abused: No    Past Surgical History:  Procedure Laterality Date  . ABDOMINAL HYSTERECTOMY  1971  . CAPD INSERTION N/A 02/03/2022   Procedure: LAPAROSCOPIC INSERTION CONTINUOUS AMBULATORY PERITONEAL DIALYSIS  (CAPD) CATHETER;  Surgeon: Leafy Ro, MD;  Location: [facility] ORS;  Service: General;  Laterality: N/A;  Provider requesting 1.5 hours / 90 minutes for procedure.  Marland Kitchen CARDIAC CATHETERIZATION    . CAROTID ENDARTERECTOMY Left   . CAROTID ENDARTERECTOMY Right   . CATARACT EXTRACTION Bilateral 2014  . CHOLECYSTECTOMY  2020  . COLONOSCOPY    . CORONARY ARTERY BYPASS GRAFT N/A 01/2001   Procedure: 3v CORONARY ARTERY ARTERY BYPASS GRAFT  . EYE SURGERY Left 07/22/2020   Dr. Kristine Royal, Vit and Shela Nevin  . TONSILLECTOMY  1953  . TOTAL HIP ARTHROPLASTY Left 03/28/2023   Procedure: TOTAL HIP ARTHROPLASTY;  Surgeon: Donato Heinz, MD;  Location: [facility] ORS;  Service: Orthopedics;  Laterality: Left;  . TOTAL HIP ARTHROPLASTY Right 11/21/2023   Procedure: TOTAL HIP ARTHROPLASTY;  Surgeon: Donato Heinz, MD;  Location: [facility] ORS;  Service: Orthopedics;  Laterality: Right;    History reviewed. No pertinent family history.  Allergies  Allergen Reactions  . Tape     Tears skin       Latest Ref Rng & Units 11/21/2023    7:44 AM 11/13/2023    3:18 PM 10/10/2023    6:51 PM  CBC  WBC 4.0 - 10.5 K/uL  10.9  10.2   Hemoglobin 12.0 - 15.0 g/dL 16.1  09.6  04.5   Hematocrit 36.0 - 46.0 % 32.0  31.9  32.9  Platelets 150 - 400 K/uL  406  329       CMP     Component Value Date/Time   NA 138 11/21/2023 0744   K 3.9 11/21/2023 0744   CL 102 11/21/2023 0744   CO2 25 11/13/2023 1518   GLUCOSE 107 (H) 11/21/2023 0744   BUN 43 (H) 11/21/2023 0744   CREATININE 2.90 (H) 11/21/2023 0744   CALCIUM 8.3 (L) 11/13/2023 1518   PROT 6.8 11/13/2023 1518   ALBUMIN 3.0 (L) 11/13/2023 1518   AST 24 11/13/2023 1518   ALT 20 11/13/2023 1518   ALKPHOS 99 11/13/2023 1518   BILITOT 0.6 11/13/2023 1518   GFRNONAA 18 (L) 11/13/2023 1518     No results found.     Assessment & Plan:   1. Bilateral carotid artery stenosis (Primary) ***  2. Atherosclerosis of native artery of left lower extremity with intermittent claudication (HCC) ***  3. Essential hypertension ***  4. Type 2 diabetes mellitus with stage 4 chronic kidney disease, unspecified whether long term insulin use (HCC) ***   Current Outpatient Medications on File Prior to Visit  Medication Sig Dispense Refill  . acetaminophen (TYLENOL) 650 MG CR tablet Take 1,300 mg by mouth every 8 (eight) hours as needed for pain.    Marland Kitchen albuterol (VENTOLIN HFA) 108 (90 Base) MCG/ACT inhaler Inhale 2 puffs into the lungs every 6 (six) hours as needed for wheezing or shortness of breath.    Marland Kitchen amLODipine (NORVASC) 5 MG tablet Take 5 mg by mouth at bedtime.    Marland Kitchen aspirin EC 81 MG tablet Take 1 tablet (81 mg total) by mouth 2 (two) times daily.    . calcitRIOL (ROCALTROL) 0.25 MCG capsule Take 0.25 mcg by mouth daily.    . cefdinir (OMNICEF)  300 MG capsule Take 1 capsule (300 mg total) by mouth daily. 7 capsule 0  . cholecalciferol 25 MCG (1000 UT) tablet Take 1,000 Units by mouth daily.    . citalopram (CELEXA) 20 MG tablet Take 20 mg by mouth every morning.    . denosumab (PROLIA) 60 MG/ML SOSY injection Inject 60 mg into the skin every 6 (six) months.    . DENTA 5000 PLUS 1.1 % CREA dental cream Place 1 Application onto teeth at bedtime.    . Flaxseed, Linseed, (FLAXSEED OIL) 1400 MG CAPS Take 1,400 mg by mouth daily.    . hydroxychloroquine (PLAQUENIL) 200 MG tablet Take 200 mg by mouth daily.    . insulin glargine, 1 Unit Dial, (TOUJEO SOLOSTAR) 300 UNIT/ML Solostar Pen Inject 16 Units into the skin daily.    . isosorbide mononitrate (IMDUR) 120 MG 24 hr tablet Take 120 mg by mouth at bedtime.    Marland Kitchen levothyroxine (SYNTHROID) 75 MCG tablet Take 75 mcg by mouth daily before breakfast.    . losartan (COZAAR) 100 MG tablet Take 100 mg by mouth daily.    . Melatonin 10 MG TABS Take 10 mg by mouth at bedtime.    . Multiple Minerals-Vitamins (CALCIUM CITRATE +) TABS Take 1 tablet by mouth daily. 300 mg tablet    . multivitamin (RENA-VIT) TABS tablet Take 1 tablet by mouth daily.    . nitroGLYCERIN (NITROSTAT) 0.4 MG SL tablet Place 0.4 mg under the tongue every 5 (five) minutes as needed for chest pain.    Marland Kitchen omeprazole (PRILOSEC) 20 MG capsule Take 20 mg by mouth every morning.    Marland Kitchen oxyCODONE (OXY IR/ROXICODONE) 5 MG immediate release tablet Take 1 tablet (  5 mg total) by mouth every 4 (four) hours as needed for moderate pain (pain score 4-6) (pain score 4-6). 30 tablet 0  . polyethylene glycol powder (GLYCOLAX/MIRALAX) 17 GM/SCOOP powder Take 17 g by mouth daily.    . ranolazine (RANEXA) 500 MG 12 hr tablet Take 500 mg by mouth 2 (two) times daily.    . rosuvastatin (CRESTOR) 10 MG tablet Take 10 mg by mouth daily.    . traMADol (ULTRAM) 50 MG tablet Take 1-2 tablets (50-100 mg total) by mouth every 4 (four) hours as needed for moderate  pain (pain score 4-6). 30 tablet 0  . torsemide (DEMADEX) 20 MG tablet Take 20 mg by mouth in the morning, at noon, in the evening, and at bedtime.     No current facility-administered medications on file prior to visit.    There are no Patient Instructions on file for this visit. No follow-ups on file.   Georgiana Spinner, NP

## 2024-02-24 NOTE — Progress Notes (Signed)
 Subjective:    Patient ID: Alexandria Moody, female    DOB: 1946-11-28, 78 y.o.   MRN: [id number] Chief Complaint  Patient presents with   Follow-up    ultrasound    The patient returns today for follow-up of her peripheral arterial disease.  She has recently had bilateral hip replacements and is doing physical therapy without any significant issues.  She continues to deny significant claudication or rest pain like symptoms.  In addition she currently has evidence of carotid disease but has not had any recent TIA, amaurosis fugax or CVA-like symptoms.  Overall she has been doing well.  Today noninvasive studies show 1 to 39% of her bilateral internal carotid arteries.  Her right ABI is noncompressible but the left is 0.78.  Her studies are fairly consistent with previous studies a year ago.  She has strong triphasic waveforms in the right with biphasic in the left.  Slightly diminished waveforms bilaterally.    Review of Systems  All other systems reviewed and are negative.      Objective:   Physical Exam Vitals reviewed.  HENT:     Head: Normocephalic.  Cardiovascular:     Rate and Rhythm: Normal rate.     Pulses:          Dorsalis pedis pulses are detected w/ Doppler on the right side and detected w/ Doppler on the left side.       Posterior tibial pulses are detected w/ Doppler on the right side and detected w/ Doppler on the left side.  Pulmonary:     Effort: Pulmonary effort is normal.  Skin:    General: Skin is warm and dry.  Neurological:     Mental Status: She is alert and oriented to person, place, and time.  Psychiatric:        Mood and Affect: Mood normal.        Behavior: Behavior normal.        Thought Content: Thought content normal.        Judgment: Judgment normal.     BP (!) 148/73 (BP Location: Right Arm, Patient Position: Sitting, Cuff Size: Normal)   Pulse 67   Resp 16   Ht 5\' 3"  (1.6 m)   Wt 149 lb (67.6 kg)   BMI 26.39 kg/m   Past Medical History:   Diagnosis Date   Adenomatous polyp of colon    Anemia of chronic renal failure    a.) recieving iron infusions + epoetin alfa-epbx (Retacrit)   Anginal pain (HCC)    Aortic atherosclerosis (HCC)    Arthritis    Atherosclerosis of native arteries of extremity with intermittent claudication (HCC)    a.) ABI/TBI 06/24/2021: 0.95 RIGHT, 0.72 LEFT   Beta-blockers contraindicated    a.) result in significant bradycardia; previously on carvedilol as Rx'd by cardiology -- disontinued due to symptomatic bradycardia   Bilateral carotid artery disease (HCC) 07/25/2021   a.) carotid doppler 07/25/2021: < 50% RICA, < 50% LICA.   Bradycardia    CHF (congestive heart failure) (HCC)    a.) TTE 05/12/2020: LVEF 55-60%; mild LA enlargement; trivial to mild MR; G1DD; b.) TTE 01/25/2023: EF 55-60%, mild MR, G1DD.   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   a.) smolding phase ANCA associated pauci immune   Heart murmur    HLD (hyperlipidemia)    Hypertension    Hypothyroidism    Insomnia    a.) uses melatonin PRN   Interstitial lung disease (HCC)    Long term current use of immunosuppressive drug    a.) hydroxychloriquine for ANCA (+) glomerulonephritis   Long-term use of aspirin therapy    Macular degeneration    MDD (major depressive disorder)    Osteoporosis    a.) on denosumab injections   PAD (peripheral artery disease) (HCC)    Pneumonia 11/2021   S/P CABG x 3 2002   a.) LIMA-LAD, SVG-diagnoal, SVG-OM   Seborrheic dermatitis    SNHL (sensorineural  hearing loss)    Squamous cell skin cancer    legs, hands, scalp   TIA (transient ischemic attack) 2019   Type 2 diabetes mellitus with chronic kidney disease on chronic dialysis, with long-term current use of insulin (HCC)    Vitamin D deficiency     Social History   Socioeconomic History   Marital status: Widowed    Spouse name: Not on file   Number of children: 2   Years of education: Not on file   Highest education level: Not on file  Occupational History   Not on file  Tobacco Use   Smoking status: Former    Current packs/day: 0.00    Average packs/day: 2.0 packs/day for 25.0 years (50.0 ttl pk-yrs)    Types: Cigarettes    Start date: 27    Quit date: 2000    Years since quitting: 25.2    Passive exposure: Past   Smokeless tobacco: Never  Vaping Use   Vaping status: Never Used  Substance and Sexual Activity   Alcohol use: Never   Drug use: Never   Sexual activity: Not Currently  Other Topics Concern   Not on file  Social History Narrative   Not on file   Social Drivers of Health   Financial Resource Strain: Low Risk  (09/08/2021)   Received from [facility], [facility]   Overall Financial Resource Strain (CARDIA)    Difficulty of Paying Living Expenses: Not hard at all  Food Insecurity: No Food Insecurity (11/21/2023)   Hunger Vital Sign    Worried About Running Out of Food in the Last Year: Never true    Ran Out of Food in the Last Year: Never true  Transportation Needs: No Transportation Needs (11/21/2023)   PRAPARE - Administrator, Civil Service (Medical): No    Lack of Transportation (Non-Medical): No  Physical Activity: Sufficiently Active (09/08/2021)   Received from [facility], [facility]   Exercise Vital Sign    Days of Exercise per Week: 7 days    Minutes of Exercise per Session: 30 min  Stress: No Stress Concern Present (09/08/2021)   Received from [facility], [facility]  Occupational Health - Occupational Stress Questionnaire    Feeling of Stress : Not at all  Social Connections: Unknown (02/13/2023)   Received from [facility], [facility]   Social Network    Social Network: Not on file  Intimate Partner Violence: Not At Risk (11/21/2023)   Humiliation, Afraid, Rape,  and Kick questionnaire    Fear of Current or Ex-Partner: No    Emotionally Abused: No    Physically Abused: No    Sexually Abused: No    Past Surgical History:  Procedure Laterality Date   ABDOMINAL HYSTERECTOMY  1971   CAPD INSERTION N/A 02/03/2022   Procedure: LAPAROSCOPIC INSERTION CONTINUOUS AMBULATORY PERITONEAL DIALYSIS  (CAPD) CATHETER;  Surgeon: Leafy Ro, MD;  Location: [facility] ORS;  Service: General;  Laterality: N/A;  Provider requesting 1.5 hours / 90 minutes for procedure.   CARDIAC CATHETERIZATION     CAROTID ENDARTERECTOMY Left    CAROTID ENDARTERECTOMY Right    CATARACT EXTRACTION Bilateral 2014   CHOLECYSTECTOMY  2020   COLONOSCOPY     CORONARY ARTERY BYPASS GRAFT N/A 01/2001   Procedure: 3v CORONARY ARTERY ARTERY BYPASS GRAFT   EYE SURGERY Left 07/22/2020   Dr. Kristine Royal, Vit and Mem Peel   TONSILLECTOMY  1953   TOTAL HIP ARTHROPLASTY Left 03/28/2023   Procedure: TOTAL HIP ARTHROPLASTY;  Surgeon: Donato Heinz, MD;  Location: [facility] ORS;  Service: Orthopedics;  Laterality: Left;   TOTAL HIP ARTHROPLASTY Right 11/21/2023   Procedure: TOTAL HIP ARTHROPLASTY;  Surgeon: Donato Heinz, MD;  Location: [facility] ORS;  Service: Orthopedics;  Laterality: Right;    History reviewed. No pertinent family history.  Allergies  Allergen Reactions   Tape     Tears skin       Latest Ref Rng & Units 11/21/2023    7:44 AM 11/13/2023    3:18 PM 10/10/2023    6:51 PM  CBC  WBC 4.0 - 10.5 K/uL  10.9  10.2   Hemoglobin 12.0 - 15.0 g/dL 16.1  09.6  04.5   Hematocrit 36.0 - 46.0 % 32.0  31.9  32.9   Platelets 150 - 400 K/uL  406  329       CMP     Component Value  Date/Time   NA 138 11/21/2023 0744   K 3.9 11/21/2023 0744   CL 102 11/21/2023 0744   CO2 25 11/13/2023 1518   GLUCOSE 107 (H) 11/21/2023 0744   BUN 43 (H) 11/21/2023 0744   CREATININE 2.90 (H) 11/21/2023 0744   CALCIUM 8.3 (L) 11/13/2023 1518   PROT 6.8 11/13/2023 1518   ALBUMIN 3.0 (L) 11/13/2023 1518   AST 24 11/13/2023 1518   ALT 20 11/13/2023 1518   ALKPHOS 99 11/13/2023 1518   BILITOT 0.6 11/13/2023 1518   GFRNONAA 18 (L) 11/13/2023 1518     No results found.     Assessment & Plan:   1. Bilateral carotid artery stenosis (Primary) Recommend:  Given the patient's asymptomatic subcritical stenosis no further invasive testing or surgery at this time.  Duplex ultrasound shows <40% stenosis bilaterally.  Continue antiplatelet therapy as prescribed Continue management of CAD, HTN and Hyperlipidemia Healthy heart diet,  encouraged exercise at least 4 times per week  Follow up in 12 months with duplex ultrasound and physical exam   2. Atherosclerosis of native artery of left lower extremity with intermittent claudication (HCC)  Recommend:  The patient has evidence of atherosclerosis of the lower extremities with minimal claudication.  The patient does not voice lifestyle limiting changes at this point in time.  Noninvasive studies do not suggest clinically significant change.  No invasive studies, angiography or surgery at this time The patient should continue walking and begin a more formal exercise program.  The patient should continue antiplatelet therapy and aggressive treatment of the lipid  abnormalities  No changes in the patient's medications at this time  Continued surveillance is indicated as atherosclerosis is likely to progress with time.    The patient will continue follow up with noninvasive studies as ordered.   3. Essential hypertension Continue antihypertensive medications as already ordered, these medications have been reviewed and there are no  changes at this time.  4. Type 2 diabetes mellitus with stage 4 chronic kidney disease, unspecified whether long term insulin use (HCC) Continue hypoglycemic medications as already ordered, these medications have been reviewed and there are no changes at this time.  Hgb A1C to be monitored as already arranged by primary service   Current Outpatient Medications on File Prior to Visit  Medication Sig Dispense Refill   acetaminophen (TYLENOL) 650 MG CR tablet Take 1,300 mg by mouth every 8 (eight) hours as needed for pain.     albuterol (VENTOLIN HFA) 108 (90 Base) MCG/ACT inhaler Inhale 2 puffs into the lungs every 6 (six) hours as needed for wheezing or shortness of breath.     amLODipine (NORVASC) 5 MG tablet Take 5 mg by mouth at bedtime.     aspirin EC 81 MG tablet Take 1 tablet (81 mg total) by mouth 2 (two) times daily.     calcitRIOL (ROCALTROL) 0.25 MCG capsule Take 0.25 mcg by mouth daily.     cefdinir (OMNICEF) 300 MG capsule Take 1 capsule (300 mg total) by mouth daily. 7 capsule 0   cholecalciferol 25 MCG (1000 UT) tablet Take 1,000 Units by mouth daily.     citalopram (CELEXA) 20 MG tablet Take 20 mg by mouth every morning.     denosumab (PROLIA) 60 MG/ML SOSY injection Inject 60 mg into the skin every 6 (six) months.     DENTA 5000 PLUS 1.1 % CREA dental cream Place 1 Application onto teeth at bedtime.     Flaxseed, Linseed, (FLAXSEED OIL) 1400 MG CAPS Take 1,400 mg by mouth daily.     hydroxychloroquine (PLAQUENIL) 200 MG tablet Take 200 mg by mouth daily.     insulin glargine, 1 Unit Dial, (TOUJEO SOLOSTAR) 300 UNIT/ML Solostar Pen Inject 16 Units into the skin daily.     isosorbide mononitrate (IMDUR) 120 MG 24 hr tablet Take 120 mg by mouth at bedtime.     levothyroxine (SYNTHROID) 75 MCG tablet Take 75 mcg by mouth daily before breakfast.     losartan (COZAAR) 100 MG tablet Take 100 mg by mouth daily.     Melatonin 10 MG TABS Take 10 mg by mouth at bedtime.     Multiple  Minerals-Vitamins (CALCIUM CITRATE +) TABS Take 1 tablet by mouth daily. 300 mg tablet     multivitamin (RENA-VIT) TABS tablet Take 1 tablet by mouth daily.     nitroGLYCERIN (NITROSTAT) 0.4 MG SL tablet Place 0.4 mg under the tongue every 5 (five) minutes as needed for chest pain.     omeprazole (PRILOSEC) 20 MG capsule Take 20 mg by mouth every morning.     oxyCODONE (OXY IR/ROXICODONE) 5 MG immediate release tablet Take 1 tablet (5 mg total) by mouth every 4 (four) hours as needed for moderate pain (pain score 4-6) (pain score 4-6). 30 tablet 0   polyethylene glycol powder (GLYCOLAX/MIRALAX) 17 GM/SCOOP powder Take 17 g by mouth daily.     ranolazine (RANEXA) 500 MG 12 hr tablet Take 500 mg by mouth 2 (two) times daily.     rosuvastatin (CRESTOR) 10 MG tablet Take 10  mg by mouth daily.     traMADol (ULTRAM) 50 MG tablet Take 1-2 tablets (50-100 mg total) by mouth every 4 (four) hours as needed for moderate pain (pain score 4-6). 30 tablet 0   torsemide (DEMADEX) 20 MG tablet Take 20 mg by mouth in the morning, at noon, in the evening, and at bedtime.     No current facility-administered medications on file prior to visit.    There are no Patient Instructions on file for this visit. No follow-ups on file.   Georgiana Spinner, NP

## 2024-02-25 LAB — VAS US ABI WITH/WO TBI
Left ABI: 0.78
Right ABI: >1

## 2024-03-09 ENCOUNTER — Encounter: Payer: Self-pay | Admitting: Oncology

## 2024-03-09 ENCOUNTER — Ambulatory Visit
Admission: EM | Admit: 2024-03-09 | Discharge: 2024-03-09 | Attending: Emergency Medicine | Admitting: Emergency Medicine

## 2024-03-09 ENCOUNTER — Encounter: Payer: Self-pay | Admitting: Emergency Medicine

## 2024-03-09 DIAGNOSIS — R42 Dizziness and giddiness: Secondary | ICD-10-CM | POA: Diagnosis present

## 2024-03-09 DIAGNOSIS — R350 Frequency of micturition: Secondary | ICD-10-CM | POA: Insufficient documentation

## 2024-03-09 DIAGNOSIS — B3731 Acute candidiasis of vulva and vagina: Secondary | ICD-10-CM | POA: Diagnosis present

## 2024-03-09 LAB — URINALYSIS, W/ REFLEX TO CULTURE (INFECTION SUSPECTED)
Bilirubin Urine: NEGATIVE
Glucose, UA: NEGATIVE mg/dL
Hgb urine dipstick: NEGATIVE
Ketones, ur: NEGATIVE mg/dL
Leukocytes,Ua: NEGATIVE
Nitrite: NEGATIVE
Protein, ur: 30 mg/dL — AB
Specific Gravity, Urine: 1.015 (ref 1.005–1.030)
pH: 7.5 (ref 5.0–8.0)

## 2024-03-09 MED ORDER — MICONAZOLE NITRATE 2 % VA CREA: 1.0000 | TOPICAL_CREAM | Freq: Every day | VAGINAL | 0 refills | Status: DC

## 2024-03-09 NOTE — ED Provider Notes (Signed)
 [facility]    CSN: [id number] Arrival date & time: 03/09/24  6644      History   Chief Complaint Chief Complaint  Patient presents with   Dizziness   Urinary Frequency    HPI Alexandria Moody is a 78 y.o. female.   HPI  78 year old female with past medical history significant for type 2 diabetes, heart murmur, GERD, CAD, end-stage renal disease on peritoneal dialysis, bilateral carotid artery stenosis, COPD, PAD, CHF, hypertension, status post CABG x 3, TIA, and hyperlipidemia presents for evaluation of frontal headache with dizziness and room spinning that started last night when she went to bed.  She reports that when she laid down she was fine but when she turned over in bed the room began to spin.  That has calm down but she still feels markedly off balance and is currently in a wheelchair.  Additionally, she was experiencing urinary urgency and frequency that started last night.  She is on peritoneal dialysis but reports that she still makes urine.  She denies any pain with urination or blood in her urine.  She also denies any change in vision, numbness, tingling, weakness in any of her extremities.  She has no past medical history of vertigo.  Past Medical History:  Diagnosis Date   Adenomatous polyp of colon    Anemia of chronic renal failure    a.) recieving iron infusions + epoetin alfa-epbx (Retacrit)   Anginal pain (HCC)    Aortic atherosclerosis (HCC)    Arthritis    Atherosclerosis of native arteries of extremity with intermittent claudication (HCC)    a.) ABI/TBI 06/24/2021: 0.95 RIGHT, 0.72 LEFT   Beta-blockers contraindicated    a.) result in significant bradycardia; previously on carvedilol as Rx'd by cardiology -- disontinued due to symptomatic bradycardia   Bilateral carotid artery disease (HCC) 07/25/2021   a.) carotid doppler 07/25/2021: < 50% RICA, < 50% LICA.   Bradycardia    CHF (congestive heart failure) (HCC)    a.) TTE 05/12/2020: LVEF  55-60%; mild LA enlargement; trivial to mild MR; G1DD; b.) TTE 01/25/2023: EF 55-60%, mild MR, G1DD.   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   a.) smolding phase ANCA associated pauci immune   Heart murmur    HLD (hyperlipidemia)    Hypertension    Hypothyroidism    Insomnia    a.) uses melatonin PRN   Interstitial lung disease (HCC)    Long term current use of immunosuppressive drug    a.) hydroxychloriquine for ANCA (+) glomerulonephritis   Long-term use of aspirin therapy    Macular degeneration    MDD (major depressive disorder)    Osteoporosis    a.) on denosumab injections   PAD (peripheral artery disease) (HCC)    Pneumonia 11/2021   S/P CABG x 3 2002   a.) LIMA-LAD, SVG-diagnoal, SVG-OM   Seborrheic dermatitis    SNHL (sensorineural hearing loss)    Squamous cell skin cancer    legs, hands, scalp   TIA (transient ischemic attack) 2019  Type 2 diabetes mellitus with chronic kidney disease on chronic dialysis, with long-term current use of insulin (HCC)    Vitamin D deficiency     Patient Active Problem List   Diagnosis Date Noted   Hx of total hip arthroplasty, right 11/21/2023   Bradycardia 04/24/2023   Status post total hip replacement, left 03/28/2023   Acute exacerbation of CHF (congestive heart failure) (HCC) 01/25/2023   Insulin dependent type 2 diabetes mellitus (HCC) 01/24/2023   CAP (community acquired pneumonia) 01/24/2023   Dyspnea 01/24/2023   ESRD on peritoneal dialysis (HCC) 01/24/2023   Primary osteoarthritis of  right hip 11/26/2022   Pain of left hip joint 03/09/2022   Nonexudative age-related macular degeneration, right eye, early dry stage 10/04/2021   Exudative age-related macular degeneration of left eye with active choroidal neovascularization (HCC) 10/04/2021   Macrocytosis 05/10/2021   Stage 4 chronic kidney disease (HCC) 05/10/2021   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   Osteoporosis, 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/2021   Secondary hyperparathyroidism, renal (HCC) 03/24/2021   B12 deficiency 02/01/2021   Senile purpura (HCC) 12/24/2020   Carotid atherosclerosis, bilateral 12/21/2020   Chronic diastolic congestive heart failure (HCC) 12/21/2020   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) 12/21/2020   GERD (gastroesophageal reflux disease) 12/21/2020   Hypothyroidism (acquired) 12/21/2020   Major depression in full remission (HCC) 12/21/2020   Seborrheic dermatitis 12/21/2020   Sensorineural hearing loss (SNHL) of both ears 12/21/2020   Thyroid disease 12/21/2020   Vitamin D deficiency 12/21/2020   Postoperative follow-up 12/09/2020   Cystoid macular edema of right eye 11/16/2020   Macular pucker, right eye 10/12/2020   Atherosclerosis of abdominal aorta (HCC) 09/10/2020   Bronchiectasis without complication (HCC) 09/10/2020   High risk medication use 09/10/2020   Severe nonproliferative diabetic retinopathy of left eye, with macular edema, associated with type 2 diabetes mellitus (HCC) 09/07/2020   Cystoid macular edema of left eye 09/07/2020   History of vitrectomy 09/07/2020   Moderate nonproliferative diabetic retinopathy of right eye (HCC) 09/07/2020   Both eyes affected by mild nonproliferative diabetic retinopathy with macular edema, associated with type 2 diabetes mellitus (HCC) 09/07/2020   Essential hypertension 11/18/2019   Fatigue 11/18/2019   Stage 3b chronic kidney disease (HCC) 11/18/2019   Non-rheumatic aortic sclerosis  07/22/2018   Adenomatous polyp of colon 10/10/2017   Chronic obstructive pulmonary disease (HCC) 03/28/2017   Osteopenia of multiple sites 06/05/2016   PAD (peripheral artery disease) (HCC) 08/17/2015   PVD (peripheral vascular disease) (HCC) 08/17/2015   Atherosclerosis of native arteries of extremity with intermittent claudication (HCC) 06/29/2015   Myalgia 06/29/2015   Claudication of both lower extremities (HCC) 06/29/2015   Stable angina (HCC) 02/23/2015   TIA (transient ischemic attack) 01/18/2015   Anemia in chronic kidney disease 08/20/2014   Anemia in stage 4 chronic kidney disease (HCC) 08/20/2014   Dizziness 07/24/2014   DOE (dyspnea on exertion) 07/24/2014   Calculus of gallbladder w/o mention of cholecystitis or obstruction 01/05/2014   Renal failure syndrome 11/19/2013   Contusion of right knee 11/10/2013   Facial contusion 11/10/2013   Fall 11/10/2013   Left elbow pain 11/10/2013   Left radial head fracture 11/10/2013   Right knee pain 11/10/2013   Occlusion and stenosis of carotid artery 06/23/2013   Stenosis of carotid artery 06/23/2013   Syncope 03/09/2013   Anemia of chronic disease 11/01/2012   Chronic kidney disease, stage 4 (severe) (HCC) 09/11/2012   Stage 5 chronic kidney disease  on chronic dialysis (HCC) 09/11/2012   Anemia 06/24/2012   Type 2 diabetes mellitus (HCC) 07/29/2011   Abnormal cardiovascular stress test 07/29/2011   Coronary artery disease involving native heart 07/29/2011   Hypertension associated with type 2 diabetes mellitus (HCC) 07/29/2011   Hyperlipidemia associated with type 2 diabetes mellitus (HCC) 07/29/2011   Obesity (BMI 30-39.9) 07/29/2011   Situational stress 07/29/2011   Type 2 diabetes mellitus with ESRD (end-stage renal disease) (HCC) 07/29/2011    Past Surgical History:  Procedure Laterality Date   ABDOMINAL HYSTERECTOMY  1971   CAPD INSERTION N/A 02/03/2022   Procedure: LAPAROSCOPIC INSERTION CONTINUOUS AMBULATORY  PERITONEAL DIALYSIS  (CAPD) CATHETER;  Surgeon: Leafy Ro, MD;  Location: [facility];  Service: General;  Laterality: N/A;  Provider requesting 1.5 hours / 90 minutes for procedure.   CARDIAC CATHETERIZATION     CAROTID ENDARTERECTOMY Left    CAROTID ENDARTERECTOMY Right    CATARACT EXTRACTION Bilateral 2014   CHOLECYSTECTOMY  2020   COLONOSCOPY     CORONARY ARTERY BYPASS GRAFT N/A 01/2001   Procedure: 3v CORONARY ARTERY ARTERY BYPASS GRAFT   EYE SURGERY Left 07/22/2020   Dr. Kristine Royal, [facility]   TONSILLECTOMY  1953   TOTAL HIP ARTHROPLASTY Left 03/28/2023   Procedure: TOTAL HIP ARTHROPLASTY;  Surgeon: Donato Heinz, MD;  Location: [facility];  Service: Orthopedics;  Laterality: Left;   TOTAL HIP ARTHROPLASTY Right 11/21/2023   Procedure: TOTAL HIP ARTHROPLASTY;  Surgeon: Donato Heinz, MD;  Location: [facility];  Service: Orthopedics;  Laterality: Right;    OB History   No obstetric history on file.      Home Medications    Prior to Admission medications   Medication Sig Start Date End Date Taking? Authorizing Provider  miconazole (MONISTAT 7) 2 % vaginal cream Place 1 Applicatorful vaginally at bedtime. 03/09/24  Yes Becky Augusta, NP  acetaminophen (TYLENOL) 650 MG CR tablet Take 1,300 mg by mouth every 8 (eight) hours as needed for pain.    [provider]  albuterol (VENTOLIN HFA) 108 (90 Base) MCG/ACT inhaler Inhale 2 puffs into the lungs every 6 (six) hours as needed for wheezing or shortness of breath. 07/22/18   [provider]  amLODipine (NORVASC) 5 MG tablet Take 5 mg by mouth at bedtime. 10/03/19   [provider]  aspirin EC 81 MG tablet Take 1 tablet (81 mg total) by mouth 2 (two) times daily. 11/22/23   Rayburn Go, PA-C  calcitRIOL (ROCALTROL) 0.25 MCG capsule Take 0.25 mcg by mouth daily. 07/20/15   [provider]  cefdinir (OMNICEF) 300 MG capsule Take 1 capsule (300 mg total) by mouth daily. 11/17/23   Katha Cabal,  DO  cholecalciferol 25 MCG (1000 UT) tablet Take 1,000 Units by mouth daily.    [provider]  citalopram (CELEXA) 20 MG tablet Take 20 mg by mouth every morning. 02/26/19   [provider]  denosumab (PROLIA) 60 MG/ML SOSY injection Inject 60 mg into the skin every 6 (six) months.    [provider]  DENTA 5000 PLUS 1.1 % CREA dental cream Place 1 Application onto teeth at bedtime. 10/30/23   [provider]  Flaxseed, Linseed, (FLAXSEED OIL) 1400 MG CAPS Take 1,400 mg by mouth daily.    [provider]  hydroxychloroquine (PLAQUENIL) 200 MG tablet Take 200 mg by mouth daily. 09/27/21   [provider]  insulin glargine, 1 Unit Dial, (TOUJEO SOLOSTAR) 300 UNIT/ML Solostar Pen  Inject 16 Units into the skin daily. 03/26/20   [provider]  isosorbide mononitrate (IMDUR) 120 MG 24 hr tablet Take 120 mg by mouth at bedtime. 04/23/20   [provider]  levothyroxine (SYNTHROID) 75 MCG tablet Take 75 mcg by mouth daily before breakfast. 04/05/20   [provider]  losartan (COZAAR) 100 MG tablet Take 100 mg by mouth daily.    [provider]  Melatonin 10 MG TABS Take 10 mg by mouth at bedtime.    [provider]  Multiple Minerals-Vitamins (CALCIUM CITRATE +) TABS Take 1 tablet by mouth daily. 300 mg tablet    [provider]  multivitamin (RENA-VIT) TABS tablet Take 1 tablet by mouth daily. 08/15/23   [provider]  nitroGLYCERIN (NITROSTAT) 0.4 MG SL tablet Place 0.4 mg under the tongue every 5 (five) minutes as needed for chest pain. 09/10/20   [provider]  omeprazole (PRILOSEC) 20 MG capsule Take 20 mg by mouth every morning. 07/05/18   [provider]  oxyCODONE (OXY IR/ROXICODONE) 5 MG immediate release tablet Take 1 tablet (5 mg total) by mouth every 4 (four) hours as needed for moderate pain (pain score 4-6) (pain score 4-6). 11/22/23   Rayburn Go, PA-C   polyethylene glycol powder (GLYCOLAX/MIRALAX) 17 GM/SCOOP powder Take 17 g by mouth daily.    [provider]  ranolazine (RANEXA) 500 MG 12 hr tablet Take 500 mg by mouth 2 (two) times daily. 04/05/20   [provider]  rosuvastatin (CRESTOR) 10 MG tablet Take 10 mg by mouth daily. 03/31/21   [provider]  torsemide (DEMADEX) 20 MG tablet Take 20 mg by mouth in the morning, at noon, in the evening, and at bedtime. 02/09/21 11/09/23  [provider]  traMADol (ULTRAM) 50 MG tablet Take 1-2 tablets (50-100 mg total) by mouth every 4 (four) hours as needed for moderate pain (pain score 4-6). 11/22/23   Rayburn Go, PA-C    Family History History reviewed. No pertinent family history.  Social History Social History   Tobacco Use   Smoking status: Former    Current packs/day: 0.00    Average packs/day: 2.0 packs/day for 25.0 years (50.0 ttl pk-yrs)    Types: Cigarettes    Start date: 47    Quit date: 2000    Years since quitting: 25.2    Passive exposure: Past   Smokeless tobacco: Never  Vaping Use   Vaping status: Never Used  Substance Use Topics   Alcohol use: Never   Drug use: Never     Allergies   Tape   Review of Systems Review of Systems  Constitutional:  Negative for fever.  Eyes:  Negative for visual disturbance.  Genitourinary:  Positive for frequency and urgency. Negative for dysuria and hematuria.  Neurological:  Positive for dizziness and headaches. Negative for weakness and numbness.     Physical Exam Triage Vital Signs ED Triage Vitals  Encounter Vitals Group     BP 03/09/24 1004 (!) 167/72     Systolic BP Percentile --      Diastolic BP Percentile --      Pulse Rate 03/09/24 1004 76     Resp 03/09/24 1004 14     Temp 03/09/24 1004 98.1 F (36.7 C)     Temp Source 03/09/24 1004 Oral     SpO2 03/09/24 1004 98 %     Weight 03/09/24 1002 149 lb 0.5 oz (67.6 kg)  Height 03/09/24 1002 5\' 3"  (1.6 m)     Head  Circumference --      Peak Flow --      Pain Score 03/09/24 1002 0     Pain Loc --      Pain Education --      Exclude from Growth Chart --    No data found.  Updated Vital Signs BP (!) 167/72 (BP Location: Right Arm) Comment: Patient has not taken her medicines today  Pulse 76   Temp 98.1 F (36.7 C) (Oral)   Resp 14   Ht 5\' 3"  (1.6 m)   Wt 149 lb 0.5 oz (67.6 kg)   SpO2 98%   BMI 26.40 kg/m   Visual Acuity Right Eye Distance:   Left Eye Distance:   Bilateral Distance:    Right Eye Near:   Left Eye Near:    Bilateral Near:     Physical Exam Vitals and nursing note reviewed.  Constitutional:      Appearance: Normal appearance. She is not ill-appearing.  HENT:     Head: Normocephalic and atraumatic.     Right Ear: Tympanic membrane, ear canal and external ear normal. There is no impacted cerumen.     Left Ear: Tympanic membrane, ear canal and external ear normal. There is no impacted cerumen.     Mouth/Throat:     Mouth: Mucous membranes are moist.     Pharynx: Oropharynx is clear. No oropharyngeal exudate or posterior oropharyngeal erythema.  Eyes:     Extraocular Movements: Extraocular movements intact.     Conjunctiva/sclera: Conjunctivae normal.     Pupils: Pupils are equal, round, and reactive to light.     Comments: Patient has vertical nystagmus with left lateral gaze as well as left lateral superior and inferior eye movements.  Cardiovascular:     Rate and Rhythm: Normal rate and regular rhythm.     Pulses: Normal pulses.     Heart sounds: Normal heart sounds. No murmur heard.    No friction rub. No gallop.  Pulmonary:     Effort: Pulmonary effort is normal.     Breath sounds: Normal breath sounds. No wheezing, rhonchi or rales.  Abdominal:     Tenderness: There is no right CVA tenderness or left CVA tenderness.  Skin:    General: Skin is warm and dry.     Capillary Refill: Capillary refill takes less than 2 seconds.  Neurological:     General: No  focal deficit present.     Mental Status: She is alert and oriented to person, place, and time.     Cranial Nerves: No cranial nerve deficit.     Motor: No weakness.     Coordination: Coordination normal.      UC Treatments / Results  Labs (all labs ordered are listed, but only abnormal results are displayed) Labs Reviewed  URINALYSIS, W/ REFLEX TO CULTURE (INFECTION SUSPECTED) - Abnormal; Notable for the following components:      Result Value   Protein, ur 30 (*)    Bacteria, UA FEW (*)    All other components within normal limits  URINE CULTURE    EKG   Radiology No results found.  Procedures Procedures (including critical care time)  Medications Ordered in UC Medications - No data to display  Initial Impression / Assessment and Plan / UC Course  I have reviewed the triage vital signs and the nursing notes.  Pertinent labs & imaging results that were available  during my care of the patient were reviewed by me and considered in my medical decision making (see chart for details).   Patient is a pleasant, nontoxic-appearing 78 year old female presenting for evaluation of headache and dizziness as outlined HPI above.  Also urinary urgency and frequency.  With regards to her headache and dizziness.  She reports that she has felt real drunk all night since laying down in bed, rolling over, and the room began to spin.  She reports that the room is no longer spending but she still is markedly off balance and is having to use a wheelchair for mobility.  She is able to relay the entire history of events to include urinary urgency and frequency that started last night.  No dysuria or hematuria.  On exam patient's pupils equal and reactive and EOMs intact at the patient does have horizontal nystagmus with left lateral gaze as well as when looking superiorly and inferiorly to the left side.  Going to the right she has nystagmus free.  Otoscopic examination of both ears is unremarkable.   Cranial nerves II through XII are intact.  Bilateral grips, upper extremity strength, and lower extremity strength, are all 5/5.  She has a negative pronator drift.  Physical exam is limited due to the fact the patient is in a wheelchair.  I have advised the patient that because she has a history of a TIA that I am unable to rule out that her symptoms and not the result of either a TIA or stroke and that she needs to be evaluated in the emergency department.  Her exam is most consistent with vertigo, however, given her significant vascular pathology and history of previous TIA intracerebral event needs to be ruled out.  She was able to provide a urine at triage to evaluate for the presence of UTI.  Urinalysis shows 30 protein but negative leukocyte esterase, nitrates, protein, blood, or glucose.  Reflex microscopy shows 6-10 WBCs, 6-10 RBCs with few bacteria and budding yeast.  The patient does not have any clear evidence of a urinary tract infection I will send her urine for culture.  I will have her treat her yeast infection with Monistat once nightly for 7 days.  With regards to her neurologic symptoms,   Final Clinical Impressions(s) / UC Diagnoses   Final diagnoses:  Dizziness  Vaginal yeast infection  Urinary frequency     Discharge Instructions      Please go to [facility] to be evaluated for your dizziness.  As I stated, this is most likely vertigo given your symptoms but given your TIA history a stroke needs to be ruled out and we cannot do that here in urgent care.  Your urinalysis did not show any clear evidence of urinary tract infection though I am going to send for culture to see if any bacteria grows out.  You do have budding yeast visible in her urine under the microscope so we will treat you for a vaginal yeast infection.  Use the Monistat once nightly at bedtime for the next 7 days.     ED Prescriptions     Medication Sig Dispense Auth. Provider   miconazole  (MONISTAT 7) 2 % vaginal cream Place 1 Applicatorful vaginally at bedtime. 45 g Becky Augusta, NP      PDMP not reviewed this encounter.   Becky Augusta, NP 03/09/24 1039

## 2024-03-09 NOTE — ED Triage Notes (Signed)
 Patient states that when she went to lie down for bed she had dizziness and during the night she had some urinary frequency.  Patient denies any pain.

## 2024-03-09 NOTE — Discharge Instructions (Addendum)
 Please go to [facility] to be evaluated for your dizziness.  As I stated, this is most likely vertigo given your symptoms but given your TIA history a stroke needs to be ruled out and we cannot do that here in urgent care.  Your urinalysis did not show any clear evidence of urinary tract infection though I am going to send for culture to see if any bacteria grows out.  You do have budding yeast visible in her urine under the microscope so we will treat you for a vaginal yeast infection.  Use the Monistat once nightly at bedtime for the next 7 days.

## 2024-03-09 NOTE — ED Notes (Signed)
 Patient is being discharged from the Urgent Care and sent to the Emergency Department via POV . Per Becky Augusta NP, patient is in need of higher level of care due to dizziness. Patient is aware and verbalizes understanding of plan of care.  Vitals:   03/09/24 1004  BP: (!) 167/72  Pulse: 76  Resp: 14  Temp: 98.1 F (36.7 C)  SpO2: 98%

## 2024-03-11 ENCOUNTER — Telehealth: Payer: Self-pay

## 2024-03-11 LAB — URINE CULTURE: Culture: >=100000 — AB

## 2024-03-11 MED ORDER — AMOXICILLIN-POT CLAVULANATE 875-125 MG PO TABS: 1.0000 | ORAL_TABLET | Freq: Two times a day (BID) | ORAL | 0 refills | Status: AC

## 2024-03-11 NOTE — Telephone Encounter (Signed)
 Per Helaine Chess,  PA-C, "Please treat her with a 7-day course of Augmentin twice daily.  Thank you." Reviewed with patient, verified pharmacy, prescription sent.

## 2024-05-06 ENCOUNTER — Encounter (INDEPENDENT_AMBULATORY_CARE_PROVIDER_SITE_OTHER): Payer: Self-pay

## 2024-11-21 ENCOUNTER — Other Ambulatory Visit: Payer: Self-pay | Admitting: Orthopedic Surgery

## 2024-11-24 ENCOUNTER — Other Ambulatory Visit: Payer: Self-pay | Admitting: Orthopedic Surgery

## 2024-11-24 DIAGNOSIS — M12812 Other specific arthropathies, not elsewhere classified, left shoulder: Secondary | ICD-10-CM

## 2024-11-27 ENCOUNTER — Ambulatory Visit
Admission: RE | Admit: 2024-11-27 | Discharge: 2024-11-27 | Disposition: A | Discharge status: Home / Self Care | Source: Ambulatory Visit | Attending: Orthopedic Surgery | Admitting: Orthopedic Surgery

## 2024-11-27 DIAGNOSIS — M12812 Other specific arthropathies, not elsewhere classified, left shoulder: Secondary | ICD-10-CM | POA: Insufficient documentation

## 2024-12-24 NOTE — Telephone Encounter (Addendum)
 Patient called clinic stating that she was just seen for UTI but now she is having burning and wants to leave a urine specimen. Patient states that her symptoms include burning that goes all the way up to the chest when urinating, increased frequency and urinary incontinence intermittently. Patient denies chest pain, shortness of breath, or other heart related symptoms. Patient informed that last visit was 12/10/2024 and with new symptoms, she would need to be seen by provider. Patient declined and stated that she would call her kidney doctor to leave a urine specimen.   2:39 PM  Reviewed

## 2024-12-25 ENCOUNTER — Encounter: Payer: Self-pay | Admitting: Urgent Care

## 2024-12-25 ENCOUNTER — Encounter
Admission: RE | Admit: 2024-12-25 | Discharge: 2024-12-25 | Disposition: A | Discharge status: Home / Self Care | Source: Ambulatory Visit | Attending: Orthopedic Surgery | Admitting: Orthopedic Surgery

## 2024-12-25 ENCOUNTER — Other Ambulatory Visit: Payer: Self-pay

## 2024-12-25 VITALS — Ht 63.0 in | Wt 139.0 lb

## 2024-12-25 DIAGNOSIS — Z01810 Encounter for preprocedural cardiovascular examination: Secondary | ICD-10-CM | POA: Diagnosis not present

## 2024-12-25 DIAGNOSIS — Z01818 Encounter for other preprocedural examination: Secondary | ICD-10-CM | POA: Insufficient documentation

## 2024-12-25 DIAGNOSIS — Z794 Long term (current) use of insulin: Secondary | ICD-10-CM | POA: Insufficient documentation

## 2024-12-25 DIAGNOSIS — R9431 Abnormal electrocardiogram [ECG] [EKG]: Secondary | ICD-10-CM | POA: Insufficient documentation

## 2024-12-25 DIAGNOSIS — I517 Cardiomegaly: Secondary | ICD-10-CM | POA: Diagnosis not present

## 2024-12-25 DIAGNOSIS — E119 Type 2 diabetes mellitus without complications: Secondary | ICD-10-CM | POA: Insufficient documentation

## 2024-12-25 DIAGNOSIS — Z01812 Encounter for preprocedural laboratory examination: Secondary | ICD-10-CM

## 2024-12-25 LAB — COMPREHENSIVE METABOLIC PANEL WITH GFR
ALT: 18 U/L (ref 0–44)
AST: 27 U/L (ref 15–41)
Albumin: 3.7 g/dL (ref 3.5–5.0)
Alkaline Phosphatase: 111 U/L (ref 38–126)
Anion gap: 12 (ref 5–15)
BUN: 45 mg/dL — ABNORMAL HIGH (ref 8–23)
CO2: 27 mmol/L (ref 22–32)
Calcium: 9.1 mg/dL (ref 8.9–10.3)
Chloride: 94 mmol/L — ABNORMAL LOW (ref 98–111)
Creatinine, Ser: 2.73 mg/dL — ABNORMAL HIGH (ref 0.44–1.00)
GFR, Estimated: 17 mL/min — ABNORMAL LOW
Glucose, Bld: 64 mg/dL — ABNORMAL LOW (ref 70–99)
Potassium: 3.9 mmol/L (ref 3.5–5.1)
Sodium: 133 mmol/L — ABNORMAL LOW (ref 135–145)
Total Bilirubin: 0.4 mg/dL (ref 0.0–1.2)
Total Protein: 6.9 g/dL (ref 6.5–8.1)

## 2024-12-25 LAB — CBC WITH DIFFERENTIAL/PLATELET
Abs Immature Granulocytes: 0.05 K/uL (ref 0.00–0.07)
Basophils Absolute: 0.1 K/uL (ref 0.0–0.1)
Basophils Relative: 1 %
Eosinophils Absolute: 0.1 K/uL (ref 0.0–0.5)
Eosinophils Relative: 2 %
HCT: 37.9 % (ref 36.0–46.0)
Hemoglobin: 12.9 g/dL (ref 12.0–15.0)
Immature Granulocytes: 1 %
Lymphocytes Relative: 15 %
Lymphs Abs: 1.1 K/uL (ref 0.7–4.0)
MCH: 33.6 pg (ref 26.0–34.0)
MCHC: 34 g/dL (ref 30.0–36.0)
MCV: 98.7 fL (ref 80.0–100.0)
Monocytes Absolute: 0.4 K/uL (ref 0.1–1.0)
Monocytes Relative: 6 %
Neutro Abs: 5.5 K/uL (ref 1.7–7.7)
Neutrophils Relative %: 75 %
Platelets: 343 K/uL (ref 150–400)
RBC: 3.84 MIL/uL — ABNORMAL LOW (ref 3.87–5.11)
RDW: 12.8 % (ref 11.5–15.5)
WBC: 7.2 K/uL (ref 4.0–10.5)
nRBC: 0 % (ref 0.0–0.2)

## 2024-12-25 LAB — URINALYSIS, ROUTINE W REFLEX MICROSCOPIC
Bilirubin Urine: NEGATIVE
Glucose, UA: NEGATIVE mg/dL
Hgb urine dipstick: NEGATIVE
Ketones, ur: NEGATIVE mg/dL
Nitrite: NEGATIVE
Protein, ur: NEGATIVE mg/dL
Specific Gravity, Urine: 1.008 (ref 1.005–1.030)
pH: 6 (ref 5.0–8.0)

## 2024-12-25 LAB — SURGICAL PCR SCREEN
MRSA, PCR: NEGATIVE
Staphylococcus aureus: NEGATIVE

## 2024-12-25 NOTE — Patient Instructions (Addendum)
 Your procedure is scheduled on: Friday 01/02/25 Report to the Registration Desk on the [facility]st floor [facility]. To find out your arrival time, please call [phone] between 1PM - 3PM on: Thursday 01/01/25 If your arrival time is 6:00 am, do not arrive before that time as the [facility] entrance doors do not open until 6:00 am.  REMEMBER: Instructions that are not followed completely may result in serious medical risk, up to and including death; or upon the discretion of your surgeon and anesthesiologist your surgery may need to be rescheduled.  Do not eat food after midnight the night before surgery.  No gum chewing or hard candies.  You may however, drink CLEAR liquids up to 2 hours before you are scheduled to arrive for your surgery. Do not drink anything within 2 hours of your scheduled arrival time.  Clear liquids include: - water  Do NOT drink anything that is not on this list.  **Type 1 and Type 2 diabetics should only drink water.**  In addition, your doctor has ordered for you to drink the provided:  Gatorade G2 Drinking this carbohydrate drink up to two hours before surgery helps to reduce insulin  resistance and improve patient outcomes. Please complete drinking 2 hours before scheduled arrival time.  One week prior to surgery: Stop Anti-inflammatories (NSAIDS) such as Advil, Aleve, Ibuprofen, Motrin, Naproxen, Naprosyn and Aspirin  based products such as Excedrin, Goody's Powder, BC Powder. Stop ANY OVER THE COUNTER supplements until after surgery. cholecalciferol  25 MCG (1000 UT)  Flaxseed, Linseed, (FLAXSEED OIL) 1400 MG CAPS  Melatonin 10 MG TAB  Multiple Minerals-Vitamins (CALCIUM  CITRATE +) TABS  You may however, continue to take Tylenol  if needed for pain up until the day of surgery.  Take  your normal dose of insulin  glargine, 1 Unit Dial, (TOUJEO  SOLOSTAR) 300 UNIT/ML Solostar Pen the day before your procedure.NO Insulin  the morning of your surgery.    Stop clopidogrel (PLAVIX) 75 MG 5 days prior to surgery (take last dose Saturday 12/27/24)  Continue taking all of your other prescription medications up until the day of surgery.  ON THE DAY OF SURGERY ONLY TAKE THESE MEDICATIONS WITH SIPS OF WATER:  citalopram  (CELEXA ) 20 MG  hydroxychloroquine  (PLAQUENIL ) 200 MG  levothyroxine  (SYNTHROID ) 75 MCG  omeprazole (PRILOSEC) 20 MG  ranolazine  (RANEXA ) 500 MG 12 hr  acetaminophen  (TYLENOL ) 650 MG (if needed)   Use inhalers on the day of surgery and bring to the hospital. albuterol  (VENTOLIN  HFA) 108 (90 Base) MCG/ACT inhaler   No Alcohol for 24 hours before or after surgery.  No Smoking including e-cigarettes for 24 hours before surgery.  No chewable tobacco products for at least 6 hours before surgery.  No nicotine patches on the day of surgery.  Do not use any recreational drugs for at least a week (preferably 2 weeks) before your surgery.  Please be advised that the combination of cocaine and anesthesia may have negative outcomes, up to and including death. If you test positive for cocaine, your surgery will be cancelled.  On the morning of surgery brush your teeth with toothpaste and water, you may rinse your mouth with mouthwash if you wish. Do not swallow any toothpaste or mouthwash.  Use CHG Soap or wipes as directed on instruction sheet.  Do not wear jewelry, make-up, hairpins, clips or nail polish.  For welded (permanent) jewelry: bracelets, anklets, waist bands, etc.  Please have this removed prior to surgery.  If it is not removed, there is  a chance that hospital personnel will need to cut it off on the day of surgery.  Do not wear lotions, powders, or perfumes.   Do not shave body hair from the neck down 48 hours before surgery.  Contact lenses, hearing aids and dentures may not be worn into surgery.  Do not bring valuables to the hospital. [facility] is not responsible for any missing/lost belongings or  valuables.   Total Shoulder Arthroplasty:  use Benzoyl Peroxide 5% Gel as directed on instruction sheet.  Notify your doctor if there is any change in your medical condition (cold, fever, infection).  Wear comfortable clothing (specific to your surgery type) to the hospital.  After surgery, you can help prevent lung complications by doing breathing exercises.  Take deep breaths and cough every 1-2 hours. Your doctor may order a device called an Incentive Spirometer to help you take deep breaths. When coughing or sneezing, hold a pillow firmly against your incision with both hands. This is called splinting. Doing this helps protect your incision. It also decreases belly discomfort.  If you are being admitted to the hospital overnight, leave your suitcase in the car. After surgery it may be brought to your room.  In case of increased patient census, it may be necessary for you, the patient, to continue your postoperative care in the Same Day Surgery department.  If you are being discharged the day of surgery, you will not be allowed to drive home. You will need a responsible individual to drive you home and stay with you for 24 hours after surgery.   If you are taking public transportation, you will need to have a responsible individual with you.  Please call the Pre-admissions Testing Dept. at [phone] if you have any questions about these instructions.  Surgery Visitation Policy:  Patients having surgery or a procedure may have two visitors.  Children under the age of 44 must have an adult with them who is not the patient.  Inpatient Visitation:    Visiting hours are 7 a.m. to 8 p.m. Up to four visitors are allowed at one time in a patient room. The visitors may rotate out with other people during the day.  One visitor age 57 or older may stay with the patient overnight and must be in the room by 8 p.m.   Merchandiser, Retail to address health-related social needs:   [email or web address]    Pre-operative 4 CHG Bath Instructions   You can play a key role in reducing the risk of infection after surgery. Your skin needs to be as free of germs as possible. You can reduce the number of germs on your skin by washing with CHG (chlorhexidine  gluconate) soap before surgery. CHG is an antiseptic soap that kills germs and continues to kill germs even after washing.   DO NOT use if you have an allergy to chlorhexidine /CHG or antibacterial soaps. If your skin becomes reddened or irritated, stop using the CHG and notify one of our RNs at [phone].   Please shower with the CHG soap starting 4 days before surgery using the following schedule:     Please keep in mind the following:  DO NOT shave, including legs and underarms, starting the day of your first shower.   You may shave your face at any point before/day of surgery.  Place clean sheets on your bed the day you start using CHG soap. Use a clean washcloth (not used since being washed) for each shower. DO NOT  sleep with pets once you start using the CHG.   CHG Shower Instructions:  If you choose to wash your hair and private area, wash first with your normal shampoo/soap.  After you use shampoo/soap, rinse your hair and body thoroughly to remove shampoo/soap residue.  Turn the water OFF and apply about 3 tablespoons (45 ml) of CHG soap to a CLEAN washcloth.  Apply CHG soap ONLY FROM YOUR NECK DOWN TO YOUR TOES (washing for 3-5 minutes)  DO NOT use CHG soap on face, private areas, open wounds, or sores.  Pay special attention to the area where your surgery is being performed.  If you are having back surgery, having someone wash your back for you may be helpful. Wait 2 minutes after CHG soap is applied, then you may rinse off the CHG soap.  Pat dry with a clean towel  Put on clean clothes/pajamas   If you choose to wear lotion, please use ONLY the CHG-compatible lotions on the back of this  paper.     Additional instructions for the day of surgery: DO NOT APPLY any lotions, deodorants, cologne, or perfumes.   Put on clean/comfortable clothes.  Brush your teeth.  Ask your nurse before applying any prescription medications to the skin.      CHG Compatible Lotions   Aveeno Moisturizing lotion  Cetaphil Moisturizing Cream  Cetaphil Moisturizing Lotion  Clairol Herbal Essence Moisturizing Lotion, Dry Skin  Clairol Herbal Essence Moisturizing Lotion, Extra Dry Skin  Clairol Herbal Essence Moisturizing Lotion, Normal Skin  Curel Age Defying Therapeutic Moisturizing Lotion with Alpha Hydroxy  Curel Extreme Care Body Lotion  Curel Soothing Hands Moisturizing Hand Lotion  Curel Therapeutic Moisturizing Cream, Fragrance-Free  Curel Therapeutic Moisturizing Lotion, Fragrance-Free  Curel Therapeutic Moisturizing Lotion, Original Formula  Eucerin Daily Replenishing Lotion  Eucerin Dry Skin Therapy Plus Alpha Hydroxy Crme  Eucerin Dry Skin Therapy Plus Alpha Hydroxy Lotion  Eucerin Original Crme  Eucerin Original Lotion  Eucerin Plus Crme Eucerin Plus Lotion  Eucerin TriLipid Replenishing Lotion  Keri Anti-Bacterial Hand Lotion  Keri Deep Conditioning Original Lotion Dry Skin Formula Softly Scented  Keri Deep Conditioning Original Lotion, Fragrance Free Sensitive Skin Formula  Keri Lotion Fast Absorbing Fragrance Free Sensitive Skin Formula  Keri Lotion Fast Absorbing Softly Scented Dry Skin Formula  Keri Original Lotion  Keri Skin Renewal Lotion Keri Silky Smooth Lotion  Keri Silky Smooth Sensitive Skin Lotion  Nivea Body Creamy Conditioning Oil  Nivea Body Extra Enriched Lotion  Nivea Body Original Lotion  Nivea Body Sheer Moisturizing Lotion Nivea Crme  Nivea Skin Firming Lotion  NutraDerm 30 Skin Lotion  NutraDerm Skin Lotion  NutraDerm Therapeutic Skin Cream  NutraDerm Therapeutic Skin Lotion  ProShield Protective Hand Cream  Provon moisturizing  lotion   Preparing for Total Shoulder Arthroplasty  Before surgery, you can play an important role by reducing the number of germs on your skin by using the following products:  Benzoyl Peroxide Gel  o Reduces the number of germs present on the skin  o Applied twice a day to shoulder area starting two days before surgery  Chlorhexidine  Gluconate (CHG) Soap  o An antiseptic cleaner that kills germs and bonds with the skin to continue killing germs even after washing  o Used for showering the night before surgery and morning of surgery  BENZOYL PEROXIDE 5% GEL  Please do not use if you have an allergy to benzoyl peroxide. If your skin becomes reddened/irritated stop using the benzoyl peroxide.  Starting two days before surgery, apply as follows:  1. Apply benzoyl peroxide in the morning and at night. Apply after taking a shower. If you are not taking a shower, clean entire shoulder front, back, and side along with the armpit with a clean wet washcloth.  2. Place a quarter-sized dollop on your shoulder and rub in thoroughly, making sure to cover the front, back, and side of your shoulder, along with the armpit.   2 days before ____ AM ____ PM 1 day before ____ AM ____ PM  3. Do this twice a day for two days. (Last application is the night before surgery, AFTER using the CHG soap).  4. Do NOT apply benzoyl peroxide gel on the day of surgery.

## 2024-12-27 ENCOUNTER — Ambulatory Visit: Payer: Self-pay | Admitting: Urgent Care

## 2024-12-27 LAB — URINE CULTURE: Culture: 20000 — AB

## 2024-12-31 ENCOUNTER — Encounter: Payer: Self-pay | Admitting: Orthopedic Surgery

## 2025-01-01 NOTE — Progress Notes (Signed)
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 Priest Tobie, MD.  In order for patient to undergo the above procedure, patient was sent for cardiac clearance. Cardiologist subsequently sent patient for stress testing which was concerning for ischemia; further invasive evaluation was recommended. Received communication from Dr. Annalee Casa, DO on 12/31/2024 that patient will need to have elective procedure postponed pending completion of diagnostic LEFT heart catheterization +/- stenting as deemed necessary based on angiographic findings.  Primary attending surgeons office was notified on 12/31/2024.   [name] no further needs from the PAT department identified at this time.  [name], MSN, APRN, FNP-C, CEN [facility]  Perioperative Services Nurse Practitioner Phone: [phone] Fax: [phone] 12/31/2024 15:15 PM  NOTE: This note has been prepared using Dragon dictation software. Despite my best ability to proofread, there is always the potential that unintentional transcriptional errors may still occur from this process. "

## 2025-01-02 ENCOUNTER — Encounter: Admission: RE | Payer: Self-pay | Source: Home / Self Care

## 2025-01-02 ENCOUNTER — Ambulatory Visit: Admission: RE | Admit: 2025-01-02 | Source: Home / Self Care | Admitting: Orthopedic Surgery

## 2025-01-02 DIAGNOSIS — R8290 Unspecified abnormal findings in urine: Secondary | ICD-10-CM

## 2025-01-02 DIAGNOSIS — Z01812 Encounter for preprocedural laboratory examination: Secondary | ICD-10-CM

## 2025-01-02 DIAGNOSIS — M19019 Primary osteoarthritis, unspecified shoulder: Secondary | ICD-10-CM

## 2025-01-02 DIAGNOSIS — R8271 Bacteriuria: Secondary | ICD-10-CM

## 2025-01-02 SURGERY — ARTHROPLASTY, SHOULDER, TOTAL, REVERSE
Anesthesia: Choice | Site: Shoulder | Laterality: Left

## 2025-01-06 ENCOUNTER — Encounter: Payer: Self-pay | Admitting: Oncology

## 2025-01-07 ENCOUNTER — Encounter: Admission: RE | Disposition: A | Payer: Self-pay | Source: Home / Self Care

## 2025-01-07 ENCOUNTER — Other Ambulatory Visit: Payer: Self-pay

## 2025-01-07 ENCOUNTER — Ambulatory Visit: Admission: RE | Admit: 2025-01-07 | Discharge: 2025-01-07 | Disposition: A | Discharge status: Home / Self Care

## 2025-01-07 DIAGNOSIS — E1151 Type 2 diabetes mellitus with diabetic peripheral angiopathy without gangrene: Secondary | ICD-10-CM | POA: Diagnosis not present

## 2025-01-07 DIAGNOSIS — E782 Mixed hyperlipidemia: Secondary | ICD-10-CM | POA: Diagnosis not present

## 2025-01-07 DIAGNOSIS — Z87891 Personal history of nicotine dependence: Secondary | ICD-10-CM | POA: Diagnosis not present

## 2025-01-07 DIAGNOSIS — I5032 Chronic diastolic (congestive) heart failure: Secondary | ICD-10-CM | POA: Diagnosis not present

## 2025-01-07 DIAGNOSIS — I2582 Chronic total occlusion of coronary artery: Secondary | ICD-10-CM | POA: Insufficient documentation

## 2025-01-07 DIAGNOSIS — Z7982 Long term (current) use of aspirin: Secondary | ICD-10-CM | POA: Insufficient documentation

## 2025-01-07 DIAGNOSIS — I25118 Atherosclerotic heart disease of native coronary artery with other forms of angina pectoris: Secondary | ICD-10-CM | POA: Insufficient documentation

## 2025-01-07 DIAGNOSIS — R9439 Abnormal result of other cardiovascular function study: Secondary | ICD-10-CM

## 2025-01-07 DIAGNOSIS — I6523 Occlusion and stenosis of bilateral carotid arteries: Secondary | ICD-10-CM | POA: Diagnosis not present

## 2025-01-07 DIAGNOSIS — Z794 Long term (current) use of insulin: Secondary | ICD-10-CM | POA: Insufficient documentation

## 2025-01-07 DIAGNOSIS — Z7902 Long term (current) use of antithrombotics/antiplatelets: Secondary | ICD-10-CM | POA: Insufficient documentation

## 2025-01-07 DIAGNOSIS — I110 Hypertensive heart disease with heart failure: Secondary | ICD-10-CM | POA: Diagnosis not present

## 2025-01-07 DIAGNOSIS — J449 Chronic obstructive pulmonary disease, unspecified: Secondary | ICD-10-CM | POA: Diagnosis not present

## 2025-01-07 DIAGNOSIS — Z951 Presence of aortocoronary bypass graft: Secondary | ICD-10-CM | POA: Insufficient documentation

## 2025-01-07 DIAGNOSIS — Z79899 Other long term (current) drug therapy: Secondary | ICD-10-CM | POA: Diagnosis not present

## 2025-01-07 DIAGNOSIS — R9430 Abnormal result of cardiovascular function study, unspecified: Secondary | ICD-10-CM

## 2025-01-07 HISTORY — PX: LEFT HEART CATH AND CORS/GRAFTS ANGIOGRAPHY: CATH118250

## 2025-01-07 LAB — GLUCOSE, CAPILLARY
Glucose-Capillary: 86 mg/dL (ref 70–99)
Glucose-Capillary: 55 mg/dL — ABNORMAL LOW (ref 70–99)
Glucose-Capillary: 123 mg/dL — ABNORMAL HIGH (ref 70–99)

## 2025-01-07 MED ORDER — FENTANYL CITRATE (PF) 100 MCG/2ML IJ SOLN
INTRAMUSCULAR | Status: AC
  Filled 2025-01-07: qty 2

## 2025-01-07 MED ORDER — HEPARIN (PORCINE) IN NACL 2000-0.9 UNIT/L-% IV SOLN
INTRAVENOUS | Status: DC | PRN
  Administered 2025-01-07: 1000 mL

## 2025-01-07 MED ORDER — HEPARIN (PORCINE) IN NACL 1000-0.9 UT/500ML-% IV SOLN
INTRAVENOUS | Status: AC
  Filled 2025-01-07: qty 1000

## 2025-01-07 MED ORDER — SODIUM CHLORIDE 0.9% FLUSH: 3.0000 mL | Freq: Two times a day (BID) | INTRAVENOUS | Status: DC

## 2025-01-07 MED ORDER — LIDOCAINE HCL 1 % IJ SOLN
INTRAMUSCULAR | Status: AC
  Filled 2025-01-07: qty 20

## 2025-01-07 MED ORDER — VERAPAMIL HCL 2.5 MG/ML IV SOLN
INTRAVENOUS | Status: DC | PRN
  Administered 2025-01-07: 2.5 mg via INTRAVENOUS

## 2025-01-07 MED ORDER — HYDRALAZINE HCL 20 MG/ML IJ SOLN: 10.0000 mg | INTRAMUSCULAR | Status: DC | PRN

## 2025-01-07 MED ORDER — HEPARIN SODIUM (PORCINE) 1000 UNIT/ML IJ SOLN
INTRAMUSCULAR | Status: DC | PRN
  Administered 2025-01-07: 5000 [IU] via INTRAVENOUS

## 2025-01-07 MED ORDER — HEPARIN SODIUM (PORCINE) 1000 UNIT/ML IJ SOLN
INTRAMUSCULAR | Status: AC
  Filled 2025-01-07: qty 10

## 2025-01-07 MED ORDER — IOHEXOL 300 MG/ML  SOLN
INTRAMUSCULAR | Status: DC | PRN
  Administered 2025-01-07: 69 mL

## 2025-01-07 MED ORDER — FENTANYL CITRATE (PF) 100 MCG/2ML IJ SOLN
INTRAMUSCULAR | Status: DC | PRN
  Administered 2025-01-07: 50 ug via INTRAVENOUS

## 2025-01-07 MED ORDER — LIDOCAINE HCL (PF) 1 % IJ SOLN
INTRAMUSCULAR | Status: DC | PRN
  Administered 2025-01-07: 5 mL

## 2025-01-07 MED ORDER — MIDAZOLAM HCL (PF) 2 MG/2ML IJ SOLN
INTRAMUSCULAR | Status: DC | PRN
  Administered 2025-01-07: 1 mg via INTRAVENOUS

## 2025-01-07 MED ORDER — ASPIRIN 81 MG PO CHEW: 81.0000 mg | CHEWABLE_TABLET | ORAL | Status: AC

## 2025-01-07 MED ORDER — SODIUM CHLORIDE 0.9 % IV SOLN: 250.0000 mL | INTRAVENOUS | Status: DC | PRN

## 2025-01-07 MED ORDER — SODIUM CHLORIDE 0.9% FLUSH: 3.0000 mL | INTRAVENOUS | Status: DC | PRN

## 2025-01-07 MED ORDER — EMPAGLIFLOZIN 25 MG PO TABS: 25.0000 mg | ORAL_TABLET | Freq: Every day | ORAL | 0 refills | Status: AC

## 2025-01-07 MED ORDER — FREE WATER: 500.0000 mL | Freq: Once | Status: DC

## 2025-01-07 MED ORDER — MIDAZOLAM HCL 2 MG/2ML IJ SOLN
INTRAMUSCULAR | Status: AC
  Filled 2025-01-07: qty 2

## 2025-01-07 MED ORDER — VERAPAMIL HCL 2.5 MG/ML IV SOLN
INTRAVENOUS | Status: AC
  Filled 2025-01-07: qty 2

## 2025-01-07 MED ORDER — DEXTROSE 50 % IV SOLN: 25.0000 mL | INTRAVENOUS | Status: AC

## 2025-01-07 MED ORDER — ACETAMINOPHEN 325 MG PO TABS: 650.0000 mg | ORAL_TABLET | ORAL | Status: DC | PRN

## 2025-01-07 MED ORDER — DEXTROSE 50 % IV SOLN
INTRAVENOUS | Status: AC
  Administered 2025-01-07: 25 mL via INTRAVENOUS
  Filled 2025-01-07: qty 50

## 2025-01-07 MED ORDER — ONDANSETRON HCL 4 MG/2ML IJ SOLN: 4.0000 mg | Freq: Four times a day (QID) | INTRAMUSCULAR | Status: DC | PRN

## 2025-01-07 MED ORDER — LABETALOL HCL 5 MG/ML IV SOLN: 10.0000 mg | INTRAVENOUS | Status: DC | PRN

## 2025-01-07 MED ORDER — SODIUM CHLORIDE 0.9 % IV SOLN: INTRAVENOUS | Status: AC

## 2025-01-07 MED ORDER — EMPAGLIFLOZIN 25 MG PO TABS: 25.0000 mg | ORAL_TABLET | Freq: Every day | ORAL | Status: DC

## 2025-01-07 MED ORDER — ASPIRIN 81 MG PO CHEW
CHEWABLE_TABLET | ORAL | Status: AC
  Administered 2025-01-07: 81 mg via ORAL
  Filled 2025-01-07: qty 1

## 2025-01-13 ENCOUNTER — Other Ambulatory Visit: Payer: Self-pay | Admitting: Orthopedic Surgery

## 2025-01-21 ENCOUNTER — Inpatient Hospital Stay
Admission: RE | Admit: 2025-01-21 | Discharge: 2025-01-21 | Disposition: A | Source: Ambulatory Visit | Attending: Orthopedic Surgery

## 2025-01-21 ENCOUNTER — Encounter: Payer: Self-pay | Admitting: Urgent Care

## 2025-01-21 ENCOUNTER — Other Ambulatory Visit: Payer: Self-pay

## 2025-01-21 ENCOUNTER — Encounter
Admission: RE | Admit: 2025-01-21 | Discharge: 2025-01-21 | Disposition: A | Source: Ambulatory Visit | Attending: Orthopedic Surgery

## 2025-01-21 VITALS — Ht 63.0 in | Wt 139.0 lb

## 2025-01-21 DIAGNOSIS — R9430 Abnormal result of cardiovascular function study, unspecified: Secondary | ICD-10-CM

## 2025-01-21 DIAGNOSIS — E119 Type 2 diabetes mellitus without complications: Secondary | ICD-10-CM

## 2025-01-21 LAB — URINALYSIS, ROUTINE W REFLEX MICROSCOPIC
Bilirubin Urine: NEGATIVE
Glucose, UA: >=500 mg/dL — AB
Hgb urine dipstick: NEGATIVE
Ketones, ur: NEGATIVE mg/dL
Leukocytes,Ua: NEGATIVE
Nitrite: NEGATIVE
Protein, ur: NEGATIVE mg/dL
RBC / HPF: 0 RBC/hpf (ref 0–5)
Specific Gravity, Urine: 1.006 (ref 1.005–1.030)
pH: 6 (ref 5.0–8.0)

## 2025-01-21 NOTE — Patient Instructions (Addendum)
 Your procedure is scheduled on: Tuesday 01/27/25 Report to the Registration Desk on the 1st floor of [facility]. To find out your arrival time, please call [phone] between 1PM - 3PM on: Monday 01/26/25 If your arrival time is 6:00 am, do not arrive before that time as the [facility] entrance doors do not open until 6:00 am.  REMEMBER: Instructions that are not followed completely may result in serious medical risk, up to and including death; or upon the discretion of your surgeon and anesthesiologist your surgery may need to be rescheduled.  Do not eat food after midnight the night before surgery.  No gum chewing or hard candies.  You may however, drink CLEAR liquids up to 2 hours before you are scheduled to arrive for your surgery. Do not drink anything within 2 hours of your scheduled arrival time.  Clear liquids include: - water    Do NOT drink anything that is not on this list.  **Type 1 and Type 2 diabetics should only drink water .**  In addition, your doctor has ordered for you to drink the provided:  Gatorade G2 Drinking this carbohydrate drink up to two hours before surgery helps to reduce insulin  resistance and improve patient outcomes. Please complete drinking 2 hours before scheduled arrival time.  One week prior to surgery: Stop Anti-inflammatories (NSAIDS) such as Advil, Aleve, Ibuprofen, Motrin, Naproxen, Naprosyn and Aspirin  based products such as Excedrin, Goody's Powder, BC Powder. Stop ANY OVER THE COUNTER supplements until after surgery. Calcium -Magnesium -Zinc (CAL-MAG-ZINC PO)  cholecalciferol  (VITAMIN D3)  Flaxseed, Linseed, (FLAXSEED OIL)   You may however, continue to take Tylenol  if needed for pain up until the day of surgery.  Stop clopidogrel (PLAVIX) 75 MG 5 days prior to surgery as instructed by your doctor.(Take last dose Wednesday 01/21/25)  Do not take insulin  the morning of your procedure.   Continue taking all of your other prescription  medications up until the day of surgery.  ON THE DAY OF SURGERY ONLY TAKE THESE MEDICATIONS WITH SIPS OF WATER :  citalopram  (CELEXA ) 20 MG  levothyroxine  (SYNTHROID ) 75 MCG  omeprazole (PRILOSEC) 20 MG  ranolazine  (RANEXA ) 500 MG omeprazole (PRILOSEC) 20 MG   Use inhalers on the day of surgery and bring to the hospital. albuterol  (VENTOLIN  HFA) 108 (90 Base) MCG/ACT inhaler   No Alcohol for 24 hours before or after surgery.  No Smoking including e-cigarettes for 24 hours before surgery.  No chewable tobacco products for at least 6 hours before surgery.  No nicotine patches on the day of surgery.  Do not use any recreational drugs for at least a week (preferably 2 weeks) before your surgery.  Please be advised that the combination of cocaine and anesthesia may have negative outcomes, up to and including death. If you test positive for cocaine, your surgery will be cancelled.  On the morning of surgery brush your teeth with toothpaste and water , you may rinse your mouth with mouthwash if you wish. Do not swallow any toothpaste or mouthwash.  Use CHG Soap or wipes as directed on instruction sheet.  Do not wear jewelry, make-up, hairpins, clips or nail polish.  For welded (permanent) jewelry: bracelets, anklets, waist bands, etc.  Please have this removed prior to surgery.  If it is not removed, there is a chance that hospital personnel will need to cut it off on the day of surgery.  Do not wear lotions, powders, or perfumes.   Do not shave body hair from the neck down 48  hours before surgery.  Contact lenses, hearing aids and dentures may not be worn into surgery.  Do not bring valuables to the hospital. [facility] is not responsible for any missing/lost belongings or valuables.   Total Shoulder Arthroplasty:  use Benzoyl Peroxide 5% Gel as directed on instruction sheet.  Notify your doctor if there is any change in your medical condition (cold, fever, infection).  Wear  comfortable clothing (specific to your surgery type) to the hospital.  After surgery, you can help prevent lung complications by doing breathing exercises.  Take deep breaths and cough every 1-2 hours. Your doctor may order a device called an Incentive Spirometer to help you take deep breaths. When coughing or sneezing, hold a pillow firmly against your incision with both hands. This is called splinting. Doing this helps protect your incision. It also decreases belly discomfort.  If you are being admitted to the hospital overnight, leave your suitcase in the car. After surgery it may be brought to your room.  In case of increased patient census, it may be necessary for you, the patient, to continue your postoperative care in the Same Day Surgery department.  If you are being discharged the day of surgery, you will not be allowed to drive home. You will need a responsible individual to drive you home and stay with you for 24 hours after surgery.   If you are taking public transportation, you will need to have a responsible individual with you.  Please call the Pre-admissions Testing Dept. at [phone] if you have any questions about these instructions.  Surgery Visitation Policy:  Patients having surgery or a procedure may have two visitors.  Children under the age of 29 must have an adult with them who is not the patient.  Inpatient Visitation:    Visiting hours are 7 a.m. to 8 p.m. Up to four visitors are allowed at one time in a patient room. The visitors may rotate out with other people during the day.  One visitor age 42 or older may stay with the patient overnight and must be in the room by 8 p.m.   Merchandiser, Retail to address health-related social needs:  [email or web address]    Pre-operative 4 CHG Bath Instructions   You can play a key role in reducing the risk of infection after surgery. Your skin needs to be as free of germs as possible. You  can reduce the number of germs on your skin by washing with CHG (chlorhexidine  gluconate) soap before surgery. CHG is an antiseptic soap that kills germs and continues to kill germs even after washing.   DO NOT use if you have an allergy to chlorhexidine /CHG or antibacterial soaps. If your skin becomes reddened or irritated, stop using the CHG and notify one of our RNs at [phone].   Please shower with the CHG soap starting 4 days before surgery using the following schedule:     Please keep in mind the following:  DO NOT shave, including legs and underarms, starting the day of your first shower.   You may shave your face at any point before/day of surgery.  Place clean sheets on your bed the day you start using CHG soap. Use a clean washcloth (not used since being washed) for each shower. DO NOT sleep with pets once you start using the CHG.   CHG Shower Instructions:  If you choose to wash your hair and private area, wash first with your normal shampoo/soap.  After you use shampoo/soap,  rinse your hair and body thoroughly to remove shampoo/soap residue.  Turn the water  OFF and apply about 3 tablespoons (45 ml) of CHG soap to a CLEAN washcloth.  Apply CHG soap ONLY FROM YOUR NECK DOWN TO YOUR TOES (washing for 3-5 minutes)  DO NOT use CHG soap on face, private areas, open wounds, or sores.  Pay special attention to the area where your surgery is being performed.  If you are having back surgery, having someone wash your back for you may be helpful. Wait 2 minutes after CHG soap is applied, then you may rinse off the CHG soap.  Pat dry with a clean towel  Put on clean clothes/pajamas   If you choose to wear lotion, please use ONLY the CHG-compatible lotions on the back of this paper.     Additional instructions for the day of surgery: DO NOT APPLY any lotions, deodorants, cologne, or perfumes.   Put on clean/comfortable clothes.  Brush your teeth.  Ask your nurse before applying any  prescription medications to the skin.      CHG Compatible Lotions   Aveeno Moisturizing lotion  Cetaphil Moisturizing Cream  Cetaphil Moisturizing Lotion  Clairol Herbal Essence Moisturizing Lotion, Dry Skin  Clairol Herbal Essence Moisturizing Lotion, Extra Dry Skin  Clairol Herbal Essence Moisturizing Lotion, Normal Skin  Curel Age Defying Therapeutic Moisturizing Lotion with Alpha Hydroxy  Curel Extreme Care Body Lotion  Curel Soothing Hands Moisturizing Hand Lotion  Curel Therapeutic Moisturizing Cream, Fragrance-Free  Curel Therapeutic Moisturizing Lotion, Fragrance-Free  Curel Therapeutic Moisturizing Lotion, Original Formula  Eucerin Daily Replenishing Lotion  Eucerin Dry Skin Therapy Plus Alpha Hydroxy Crme  Eucerin Dry Skin Therapy Plus Alpha Hydroxy Lotion  Eucerin Original Crme  Eucerin Original Lotion  Eucerin Plus Crme Eucerin Plus Lotion  Eucerin TriLipid Replenishing Lotion  Keri Anti-Bacterial Hand Lotion  Keri Deep Conditioning Original Lotion Dry Skin Formula Softly Scented  Keri Deep Conditioning Original Lotion, Fragrance Free Sensitive Skin Formula  Keri Lotion Fast Absorbing Fragrance Free Sensitive Skin Formula  Keri Lotion Fast Absorbing Softly Scented Dry Skin Formula  Keri Original Lotion  Keri Skin Renewal Lotion Keri Silky Smooth Lotion  Keri Silky Smooth Sensitive Skin Lotion  Nivea Body Creamy Conditioning Oil  Nivea Body Extra Enriched Lotion  Nivea Body Original Lotion  Nivea Body Sheer Moisturizing Lotion Nivea Crme  Nivea Skin Firming Lotion  NutraDerm 30 Skin Lotion  NutraDerm Skin Lotion  NutraDerm Therapeutic Skin Cream  NutraDerm Therapeutic Skin Lotion  ProShield Protective Hand Cream  Provon moisturizing lotion  Preparing for Total Shoulder Arthroplasty  Before surgery, you can play an important role by reducing the number of germs on your skin by using the following products:  Benzoyl Peroxide Gel  o Reduces the number  of germs present on the skin  o Applied twice a day to shoulder area starting two days before surgery  Chlorhexidine  Gluconate (CHG) Soap  o An antiseptic cleaner that kills germs and bonds with the skin to continue killing germs even after washing  o Used for showering the night before surgery and morning of surgery  BENZOYL PEROXIDE 5% GEL                               Please do not use if you have an allergy to benzoyl peroxide. If your skin becomes reddened/irritated stop using the benzoyl peroxide.  Starting two days before surgery, apply as follows:  1. Apply benzoyl peroxide in the morning and at night. Apply after taking a shower. If you are not taking a shower, clean entire shoulder front, back, and side along with the armpit with a clean wet washcloth.  2. Place a quarter-sized dollop on your shoulder and rub in thoroughly, making sure to cover the front, back, and side of your shoulder, along with the armpit.  2 days before ____ AM ____ PM 1 day before ____ AM ____ PM  3. Do this twice a day for two days. (Last application is the night before surgery, AFTER using the CHG soap).  4. Do NOT apply benzoyl peroxide gel on the day of surgery.

## 2025-01-22 NOTE — Progress Notes (Signed)
 " Perioperative / Anesthesia Services  Pre-Admission Testing Clinical Review / Pre-Operative Anesthesia Consult  Date: 01/22/25  PATIENT DEMOGRAPHICS: Name: Alexandria Moody DOB: 1946/02/22 MRN:   [id number]  Note: Available PAT nursing documentation and vital signs have been reviewed. Clinical nursing staff has updated patient's PMH/PSHx, current medication list, and drug allergies/intolerances to ensure complete and comprehensive history available to assist care teams in MDM as it pertains to the aforementioned surgical procedure and anticipated anesthetic course. Extensive review of available clinical information personally performed. Nursing documentation reviewed. [facility] PMH and PSHx updated with any diagnoses and/or procedures that I have knowledge of that may have been inadvertently omitted during her intake with the pre-admission testing department's nursing staff.  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 female who is submitted for pre-surgical anesthesia review and clearance prior to her undergoing the above procedure. Patient is a Former Smoker (50 pack years; quit 12/1998). 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   Patient is followed by cardiology Juanice, MD). She was last seen in the cardiology clinic on 01/01/2025; notes reviewed. At the time of her clinic visit, patient complained of episodes of chest pain and shortness of breath. Patient denied any PND, orthopnea, palpitations, significant peripheral edema, weakness, fatigue, vertiginous symptoms, or presyncope/syncope. Patient with a past medical history significant for cardiovascular diagnoses. Documented physical exam was grossly benign, providing no evidence of acute exacerbation and/or decompensation of the patient's known cardiovascular conditions.  Patient underwent a three-vessel CABG procedure in 2002.  LIMA-LAD, SVG-diagonal, SVG-OM bypass grafts were placed.    Patient reported to have suffered a TIA in 2019.  She denies any residual effects from this neurological event.   Myocardial perfusion imaging study performed on 02/21/2021 revealed a mildly reduced left ventricular systolic function with an EF of 48%.  There was a moderate mixed perfusion abnormality present in the posterior lateral myocardial region on stress images. Resting images showed minimal perfusion abnormality of mild intensity in the posterior lateral myocardium consistent with previous myocardial infarction and/or scar as well as reversibility of ischemia.   Multiple cardiac event monitor studies performed on 04/24/2023 due to reports of symptomatic bradycardia with heart rates in the 40s.  Study revealed underlying sinus rhythm with rates between 52 and 102 bpm.  There was no significant atrial or ventricular ectopy.  No sustained pauses or arrhythmias noted.  No significant patient triggered events.   Most recent TTE performed on 01/25/2023 revealed a normal left ventricular systolic function with an EF of 55-60%. There were no segmental wall motion abnormalities noted.  Diastolic Doppler parameters consistent with impaired relaxation (G1DD).  There  was mild mitral valve regurgitation. All transvalvular gradients were noted to be normal providing no evidence suggestive of valvular stenosis. Aorta  normal in size with no evidence of aneurysmal dilatation.  Blood pressure well controlled at 110/70 mmHg on currently prescribed ARB (losartan ) and diuretic (metolazone) therapies.  Patient is on ranolazine  for angina prevention.  In addition to this therapy, patient also has a supply of short acting nitrates (NTG) to use on a as needed basis for recurrent angina/anginal equivalent symptoms; denied recent use.  Patient is on rosuvastatin  for her HLD diagnosis and ASCVD prevention.  T2DM poorly controlled on currently prescribed regimen; last HgbA1c was 8.5% when checked on 01/15/2025.  In the setting of known cardiovascular diagnoses and concurrent T2DM, patient is taking an SGLT2i (empagliflozin ) for added cardiovascular and renovascular protection.  Patient does not have an OSAH diagnosis.  Functional capacity limited by patient's age and multiple medical comorbidities, including her orthopedic pain.  Patient able to complete all of her ADLs/IADLs without cardiovascular limitation.  Per the [facility], patient questionably able to achieve 4 METS of physical activity without experiencing, at least to some degree, significant angina/anginal equivalent symptoms.  In efforts to further risk stratify patient prior to upcoming orthopedic surgery, and to further assess her complaints of chest pain and shortness of breath in the setting of significant cardiovascular disease, the decision was made to repeat diagnostic LEFT heart catheterization prior to surgery.  Patient underwent diagnostic LEFT heart catheterization on 01/07/2025.  EF mildly reduced at 40%.  Study revealed multivessel CAD: 100% ostial-proximal LAD, 100% OM1, 30% proximal RCA, and 30% RPDA.  Bypass grafts all noted to be patent.  Interventional cardiology made the decision to defer further intervention at that  time opting for aggressive secondary prevention through medical management.  Alexandria Moody is scheduled for an elective ARTHROPLASTY, SHOULDER, TOTAL, REVERSE (Left: Shoulder); TENODESIS, BICEPS (Left: Shoulder) on 01/27/2025 with Dr. Earnestine Blanch, MD. Given patient's past medical history significant for cardiovascular diagnoses, presurgical cardiac clearance was sought by the PAT team. Per cardiology, newly decreased LVEF ~40%. LHC today shows patent grafts. Patient can proceed with ortho surgery, she is elevated but acceptable risk.  In review of the patient's medication reconciliation, it is noted that she is on daily oral antithrombotic therapy. She has been instructed on recommendations for holding her clopidogrel for 5 days prior to her procedure with plans to restart as soon as postoperative bleeding risk felt to be minimized by his primary attending surgeon. The patient has been instructed that her last dose should be on 01/21/2025.  Patient denies previous perioperative complications with anesthesia in the past. In review her EMR, it is noted that patient underwent a general anesthetic course here at [facility] in 11/2023 without documented complications.   MOST RECENT VITAL SIGNS:    01/21/2025    1:26 PM 01/07/2025    3:00 PM 01/07/2025    2:45 PM  Vitals with BMI  Height 5' 3    Weight 139 lbs    BMI 24.63    Systolic  120 106  Diastolic  60 53  Pulse  77 78   PROVIDERS/SPECIALISTS: NOTE: Primary physician provider listed below. Patient may have been seen by APP or partner within same practice.   PROVIDER ROLE / SPECIALTY LAST Alexandria Blanch Earnestine, MD Orthopedics (Surgeon) 12/25/2024  Lora Odor, FNP Primary Care Provider 12/10/2024  Dewane Shiner, DO Cardiology 01/07/2025  Damian Setter, MD Endocrinology 01/14/2025  Blanch Solo, MD Rheumatology 10/29/2024  Parris Manna, MD Pulmonary Medicine 04/16/2023  Babara Call, MD  Medical Oncology 01/12/2022   ALLERGIES:  Tape  CURRENT HOME MEDICATIONS:  acetaminophen  (TYLENOL ) 650 MG CR tablet   albuterol  (VENTOLIN  HFA) 108 (90 Base) MCG/ACT inhaler   calcitRIOL  (ROCALTROL ) 0.25 MCG capsule   Calcium -Magnesium -Zinc (CAL-MAG-ZINC PO)   cholecalciferol  (VITAMIN D3) 25 MCG (1000 UNIT) tablet   citalopram  (CELEXA ) 20 MG tablet   clopidogrel (PLAVIX) 75 MG tablet   denosumab (PROLIA) 60 MG/ML SOSY injection   DENTA 5000 PLUS 1.1 % CREA dental cream   Ferrous Sulfate  (SLOW FE PO)   Flaxseed, Linseed, (FLAXSEED OIL) 1400 MG CAPS   insulin  glargine, 1 Unit Dial, (TOUJEO  SOLOSTAR) 300 UNIT/ML Solostar Pen   levothyroxine  (SYNTHROID ) 75 MCG tablet   losartan  (COZAAR ) 100 MG tablet   metolazone (ZAROXOLYN) 5 MG tablet   multivitamin (RENA-VIT) TABS tablet   nitroGLYCERIN  (NITROSTAT ) 0.4 MG SL tablet   omeprazole (PRILOSEC) 20 MG capsule   polyethylene glycol powder (GLYCOLAX /MIRALAX ) 17 GM/SCOOP powder   ranolazine  (RANEXA ) 500 MG 12 hr tablet   rosuvastatin  (CRESTOR ) 10 MG tablet   torsemide  (DEMADEX ) 20 MG tablet   empagliflozin  (JARDIANCE ) 25 MG TABS tablet   insulin  aspart (NOVOLOG ) 100 UNIT/ML injection   HISTORY: Past Medical History:  Diagnosis Date   Adenomatous polyp of colon    Anemia of chronic renal failure    a.) recieving iron  infusions + epoetin  alfa-epbx (Retacrit )   Anginal pain    Aortic atherosclerosis    Arthritis    Atherosclerosis of native arteries of extremity with intermittent claudication    a.) ABI/TBI 06/24/2021: 0.95 RIGHT, 0.72 LEFT   Beta-blockers contraindicated    a.) result in significant bradycardia; previously on carvedilol  as Rx'd by cardiology -- discontinued due to symptomatic bradycardia   Bilateral carotid artery disease 07/25/2021   a.) carotid doppler 07/25/2021: < 50% RICA, < 50% LICA.   Bradycardia    CHF (congestive heart failure) (HCC)    a.) TTE 05/12/2020: LVEF 55-60%; mild LA enlargement; trivial to mild  MR; G1DD; b.) TTE 01/25/2023: EF 55-60%, mild MR, G1DD.   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   a.) smolding phase ANCA associated pauci immune   Heart murmur    HLD (hyperlipidemia)    Hypertension    Hypothyroidism    Insomnia    a.) uses melatonin PRN   Interstitial lung disease (HCC)    Long term current use of immunosuppressive drug    a.) hydroxychloriquine for ANCA (+) glomerulonephritis   Long-term use of aspirin  therapy    Macular degeneration    MDD (major depressive disorder)    Osteoporosis    a.) on denosumab injections   PAD (peripheral artery disease)    Pneumonia 11/2021   S/P CABG x 3 2002   a.) LIMA-LAD, SVG-diagnoal, SVG-OM   Seborrheic dermatitis    SNHL (sensorineural hearing loss)    Squamous cell skin cancer    legs, hands, scalp   TIA (transient ischemic attack) 2019   Type 2 diabetes mellitus with chronic kidney disease on chronic dialysis, with long-term current use of insulin  (HCC)    Vitamin  D deficiency    Past Surgical History:  Procedure Laterality Date   ABDOMINAL HYSTERECTOMY  1971   CAPD INSERTION N/A 02/03/2022   Procedure: LAPAROSCOPIC INSERTION CONTINUOUS AMBULATORY PERITONEAL DIALYSIS  (CAPD) CATHETER;  Surgeon: Jordis Laneta FALCON, MD;  Location: [facility] ORS;  Service: General;  Laterality: N/A;  Provider requesting 1.5 hours / 90 minutes for procedure.   CARDIAC CATHETERIZATION     CAROTID ENDARTERECTOMY Left    CAROTID ENDARTERECTOMY Right    CATARACT EXTRACTION Bilateral 2014   CHOLECYSTECTOMY  2020    COLONOSCOPY     CORONARY ARTERY BYPASS GRAFT N/A 01/2001   Procedure: 3v CORONARY ARTERY ARTERY BYPASS GRAFT   EYE SURGERY Left 07/22/2020   Dr. Lind, Vit and Mem Peel   LEFT HEART CATH AND CORS/GRAFTS ANGIOGRAPHY N/A 01/07/2025   Procedure: LEFT HEART CATH AND CORS/GRAFTS ANGIOGRAPHY;  Surgeon: Katina Albright, MD;  Location: [facility] INVASIVE CV LAB;  Service: Cardiovascular;  Laterality: N/A;   TONSILLECTOMY  1953   TOTAL HIP ARTHROPLASTY Left 03/28/2023   Procedure: TOTAL HIP ARTHROPLASTY;  Surgeon: Mardee Lynwood SQUIBB, MD;  Location: [facility] ORS;  Service: Orthopedics;  Laterality: Left;   TOTAL HIP ARTHROPLASTY Right 11/21/2023   Procedure: TOTAL HIP ARTHROPLASTY;  Surgeon: Mardee Lynwood SQUIBB, MD;  Location: [facility] ORS;  Service: Orthopedics;  Laterality: Right;   No family history on file. Social History   Tobacco Use   Smoking status: Former    Current packs/day: 0.00    Average packs/day: 2.0 packs/day for 25.0 years (50.0 ttl pk-yrs)    Types: Cigarettes    Start date: 89    Quit date: 2000    Years since quitting: 26.1    Passive exposure: Past   Smokeless tobacco: Never  Substance Use Topics   Alcohol use: Never   LABS:  Hospital Outpatient Visit on 01/21/2025  Component Date Value Ref Range Status   Color, Urine 01/21/2025 YELLOW (A)  YELLOW Final   APPearance 01/21/2025 CLEAR (A)  CLEAR Final   Specific Gravity, Urine 01/21/2025 1.006  1.005 - 1.030 Final   pH 01/21/2025 6.0  5.0 - 8.0 Final   Glucose, UA 01/21/2025 >=500 (A)  NEGATIVE mg/dL Final   Hgb urine dipstick 01/21/2025 NEGATIVE  NEGATIVE Final   Bilirubin Urine 01/21/2025 NEGATIVE  NEGATIVE Final   Ketones, ur 01/21/2025 NEGATIVE  NEGATIVE mg/dL Final   Protein, ur 97/95/7973 NEGATIVE  NEGATIVE mg/dL Final   Nitrite 97/95/7973 NEGATIVE  NEGATIVE Final   Leukocytes,Ua 01/21/2025 NEGATIVE  NEGATIVE Final   RBC / HPF 01/21/2025 0  0 - 5 RBC/hpf Final   WBC, UA 01/21/2025 0-5  0 - 5 WBC/hpf Final   Bacteria, UA  01/21/2025 RARE (A)  NONE SEEN Final   Squamous Epithelial / HPF 01/21/2025 0-5  0 - 5 /HPF Final   Mucus 01/21/2025 PRESENT   Final   Hyaline Casts, UA 01/21/2025 PRESENT   Final   Performed at [facility], [address] [place], [place] [address]  Admission on 01/07/2025, Discharged on 01/07/2025  Component Date Value Ref Range Status   Glucose-Capillary 01/07/2025 86  70 - 99 mg/dL Final   Glucose reference range applies only to samples taken after fasting for at least 8 hours.   Glucose-Capillary 01/07/2025 55 (L)  70 - 99 mg/dL Final   Glucose reference range applies only to samples taken after fasting for at least 8 hours.   Glucose-Capillary 01/07/2025 123 (H)  70 - 99 mg/dL Final   Glucose  reference range applies only to samples taken after fasting for at least 8 hours.  Hospital Outpatient Visit on 12/25/2024  Component Date Value Ref Range Status   MRSA, PCR 12/25/2024 NEGATIVE  NEGATIVE Final   Staphylococcus aureus 12/25/2024 NEGATIVE  NEGATIVE Final   Comment: (NOTE) The Xpert SA Assay (FDA approved for NASAL specimens in patients 91 years of age and older), is one component of a comprehensive surveillance program. It is not intended to diagnose infection nor to guide or monitor treatment. Performed at [facility], [address] [place], [place] [address]    WBC 12/25/2024 7.2  4.0 - 10.5 K/uL Final   RBC 12/25/2024 3.84 (L)  3.87 - 5.11 MIL/uL Final   Hemoglobin 12/25/2024 12.9  12.0 - 15.0 g/dL Final   HCT 98/91/7973 37.9  36.0 - 46.0 % Final   MCV 12/25/2024 98.7  80.0 - 100.0 fL Final   MCH 12/25/2024 33.6  26.0 - 34.0 pg Final   MCHC 12/25/2024 34.0  30.0 - 36.0 g/dL Final   RDW 98/91/7973 12.8  11.5 - 15.5 % Final   Platelets 12/25/2024 343  150 - 400 K/uL Final   nRBC 12/25/2024 0.0  0.0 - 0.2 % Final   Neutrophils Relative % 12/25/2024 75  % Final   Neutro Abs 12/25/2024 5.5  1.7 - 7.7 K/uL Final   Lymphocytes Relative 12/25/2024 15   % Final   Lymphs Abs 12/25/2024 1.1  0.7 - 4.0 K/uL Final   Monocytes Relative 12/25/2024 6  % Final   Monocytes Absolute 12/25/2024 0.4  0.1 - 1.0 K/uL Final   Eosinophils Relative 12/25/2024 2  % Final   Eosinophils Absolute 12/25/2024 0.1  0.0 - 0.5 K/uL Final   Basophils Relative 12/25/2024 1  % Final   Basophils Absolute 12/25/2024 0.1  0.0 - 0.1 K/uL Final   Immature Granulocytes 12/25/2024 1  % Final   Abs Immature Granulocytes 12/25/2024 0.05  0.00 - 0.07 K/uL Final   Performed at [facility], [address] [place], [place] [address]   Sodium 12/25/2024 133 (L)  135 - 145 mmol/L Final   Potassium 12/25/2024 3.9  3.5 - 5.1 mmol/L Final   Chloride 12/25/2024 94 (L)  98 - 111 mmol/L Final   CO2 12/25/2024 27  22 - 32 mmol/L Final   Glucose, Bld 12/25/2024 64 (L)  70 - 99 mg/dL Final   Glucose reference range applies only to samples taken after fasting for at least 8 hours.   BUN 12/25/2024 45 (H)  8 - 23 mg/dL Final   Creatinine, Ser 12/25/2024 2.73 (H)  0.44 - 1.00 mg/dL Final   Calcium  12/25/2024 9.1  8.9 - 10.3 mg/dL Final   Total Protein 98/91/7973 6.9  6.5 - 8.1 g/dL Final   Albumin  12/25/2024 3.7  3.5 - 5.0 g/dL Final   AST 98/91/7973 27  15 - 41 U/L Final   ALT 12/25/2024 18  0 - 44 U/L Final   Alkaline Phosphatase 12/25/2024 111  38 - 126 U/L Final   Total Bilirubin 12/25/2024 0.4  0.0 - 1.2 mg/dL Final   GFR, Estimated 12/25/2024 17 (L)  >60 mL/min Final   Comment: (NOTE) Calculated using the CKD-EPI Creatinine Equation (2021)    Anion gap 12/25/2024 12  5 - 15 Final   Performed at [facility], [address] [place], [place] [address]   Color, Urine 12/25/2024 YELLOW (A)  YELLOW Final   APPearance 12/25/2024 CLEAR (A)  CLEAR Final   Specific Gravity, Urine 12/25/2024 1.008  1.005 - 1.030 Final   pH 12/25/2024 6.0  5.0 - 8.0 Final   Glucose, UA 12/25/2024 NEGATIVE  NEGATIVE mg/dL Final   Hgb urine dipstick 12/25/2024 NEGATIVE  NEGATIVE Final    Bilirubin Urine 12/25/2024 NEGATIVE  NEGATIVE Final   Ketones, ur 12/25/2024 NEGATIVE  NEGATIVE mg/dL Final   Protein, ur 98/91/7973 NEGATIVE  NEGATIVE mg/dL Final   Nitrite 98/91/7973 NEGATIVE  NEGATIVE Final   Leukocytes,Ua 12/25/2024 TRACE (A)  NEGATIVE Final   RBC / HPF 12/25/2024 6-10  0 - 5 RBC/hpf Final   WBC, UA 12/25/2024 11-20  0 - 5 WBC/hpf Final   Bacteria, UA 12/25/2024 RARE (A)  NONE SEEN Final   Squamous Epithelial / HPF 12/25/2024 0-5  0 - 5 /HPF Final   Mucus 12/25/2024 PRESENT   Final   Performed at [facility], [address] [place], [place] [address]    ECG: Date: 01/07/2025  Time ECG obtained: 1109 AM Rate: 106 bpm Rhythm: ST; ILBBB Axis (leads I and aVF): normal Intervals: PR 46 ms. QRS 114 ms. QTc 498 ms. ST segment and T wave changes: No evidence of acute T wave abnormalities or significant ST segment elevation or depression.  Evidence of a possible, age undetermined, prior infarct:  No Comparison: Previous tracing obtained on 12/25/2024 showed normal sinus rhythm with T wave abnormalities in the anterolateral leads at a rate of 61 bpm.   IMAGING / PROCEDURES: LEFT HEART CATHETERIZATION AND CORONARY ANGIOGRAPHY performed on 01/07/2025 Severe two-vessel CAD 100% ostial to proximal LAD 100% OM1 30% proximal RCA 30% RPDA LIMA to mid LAD, SVG to proximal LAD and SVG to OM patent, with SVG to OM being a small graft feeding a small vessel Optimize medical therapy   CT SHOULDER LEFT WO CONTRAST performed on 11/27/2024 Severe osteoarthritis of the glenohumeral joint Moderate arthropathy of the acromioclavicular joint. Aortic atherosclerosis  CT CHEST ABDOMEN PELVIS WO CONTRAST performed on 10/10/2023 Emphysematous changes in the lungs. Peripheral fibrosis and scattered honeycomb changes in the lungs likely representing usual interstitial pneumonitis Diffuse aortic atherosclerosis. Anterior compression of the L1 vertebra appears chronic. No  acute posttraumatic changes demonstrated in the chest, abdomen, or pelvis as visualized.   CT CERVICAL SPINE WO CONTRAST performed on 10/10/2023 Degenerative changes throughout the cervical spine with disc space narrowing and endplate osteophyte formation.  Degenerative changes in the posterior facet joints. Normal alignment of the cervical spine.  No acute displaced fractures identified.   TRANSTHORACIC ECHOCARDIOGRAM performed on 01/25/2023 Left ventricular ejection fraction, by estimation, is 55 to 60%. The left ventricle has normal function. The left ventricle has no regional wall motion abnormalities. Left ventricular diastolic parameters are consistent with Grade I diastolic  dysfunction (impaired relaxation).  Right ventricular systolic function is normal. The right ventricular size is normal.  The mitral valve is normal in structure. Mild mitral valve regurgitation.  The aortic valve is normal in structure. Aortic valve regurgitation is not visualized.    DIAGNOSTIC RADIOGRAPHS OF BILATERAL HIPS 2 VIEWS WITHOUT OR WITHOUT PELVIS performed on 11/23/2022 Radiographs of the RIGHT hip demonstrate moderate narrowing of the cartilage space especially superiorly.  Subchondral sclerosis is noted.  Radiographs of the LEFT hip demonstrate significant narrowing of the cartilage space with bone-on-bone articulation.  There is some mild flattening of the superior aspect of the femoral head.  Subchondral sclerosis is noted. Osteophyte formation is present.  Also of note is significant vascular calcification extending  down both thighs.    MYOCARDIAL PERFUSION IMAGING STUDY (LEXISCAN) performed on 10/12/2022 LVEF 47%  Normal myocardial thickening and wall motion No artifacts noted Left ventricular cavity size normal SPECT images demonstrate moderate mixed perfusion abnormality of moderate intensity present posterior lateral myocardial region on stress images.  Resting images showed minimal perfusion  abnormality of mild intensity in the posterior lateral myocardium consistent with possible previous infarct and/or scar as well as reversibility and ischemia.   BILATERAL CAROTID DOPPLER performed on 08/31/2022 Mild calcified plaque at the level of the proximal right ICA. Estimated right ICA stenosis is less than 50%. Right vertebral artery shows antegrade flow with normal waveform and velocity. Moderate calcified plaque at the level of the distal common carotid artery and mild calcified plaque at the level of the carotid bulb and proximal left ICA. Estimated left ICA stenosis is less than 50%. Left vertebral artery shows antegrade flow with normal waveform and velocity.   VASCULAR ULTRASOUND ABI WITH/WITHOUT TBI performed on 06/24/2021 Right: Resting right ankle-brachial index is within normal range. Evidence of mild tibial level arterial disease of right lower extremity. Mildly abnormal left TBI.  Left: Resting left ankle-brachial index indicates moderate left lower extremity arterial disease. The left toe-brachial index is abnormal.    IMPRESSION AND PLAN: Talasia Saulter has been referred for pre-anesthesia review and clearance prior to her undergoing the planned anesthetic and procedural courses. Available labs, pertinent testing, and imaging results were personally reviewed by me in preparation for upcoming operative/procedural course. [facility] medical record has been updated following extensive record review and patient interview with PAT staff.   This patient has been appropriately cleared by cardiology with an overall ACCEPTABLE risk of patient experiencing significant perioperative cardiovascular complications. here at [facility]. Based on clinical review performed today (01/22/25), barring any significant acute changes in the patient's overall condition, it is anticipated that she will be able to proceed with the planned surgical intervention. Any acute  changes in clinical condition may necessitate her procedure being postponed and/or cancelled. Patient will meet with anesthesia team (MD and/or CRNA) on the day of her procedure for preoperative evaluation/assessment. Questions regarding anesthetic course will be fielded at that time.   Pre-surgical instructions were reviewed with the patient during his PAT appointment, and questions were fielded to satisfaction by PAT clinical staff. She has been instructed on which medications that she will need to hold prior to surgery, as well as the ones that have been deemed safe/appropriate to take on the day of her procedure. As part of the general education provided by PAT, patient made aware both verbally and in writing, that she would need to abstain from the use of any illegal substances during her perioperative course. She was advised that failure to follow the provided instructions could necessitate case cancellation or result in serious perioperative complications up to and including death. Patient encouraged to contact PAT and/or her surgeon's office to discuss any questions or concerns that may arise prior to surgery; verbalized understanding.   [name], MSN, APRN, FNP-C, CEN [facility]  Perioperative Services Nurse Practitioner Phone: [phone] Fax: [phone] 01/22/25 10:54 AM  NOTE: This note has been prepared using Dragon dictation software. Despite my best ability to proofread, there is always the potential that unintentional transcriptional errors may still occur from this process. "

## 2025-01-27 ENCOUNTER — Encounter: Admission: RE | Payer: Self-pay

## 2025-01-27 ENCOUNTER — Encounter: Payer: Self-pay | Admitting: Urgent Care

## 2025-01-27 ENCOUNTER — Ambulatory Visit: Admission: RE | Admit: 2025-01-27 | Admitting: Orthopedic Surgery

## 2025-02-20 ENCOUNTER — Ambulatory Visit (INDEPENDENT_AMBULATORY_CARE_PROVIDER_SITE_OTHER): Admitting: Nurse Practitioner

## 2025-02-20 ENCOUNTER — Encounter (INDEPENDENT_AMBULATORY_CARE_PROVIDER_SITE_OTHER)

## 2025-02-25 ENCOUNTER — Encounter (INDEPENDENT_AMBULATORY_CARE_PROVIDER_SITE_OTHER)

## 2025-02-25 ENCOUNTER — Ambulatory Visit (INDEPENDENT_AMBULATORY_CARE_PROVIDER_SITE_OTHER): Admitting: Nurse Practitioner
